# Patient Record
Sex: Female | Born: 1970 | Race: White | Hispanic: No
Health system: Southern US, Community
[De-identification: ages and names within clinical notes are randomized; demographics above are authoritative.]

---

## 2021-02-09 ENCOUNTER — Inpatient Hospital Stay (HOSPITAL_COMMUNITY): Payer: Medicaid - Out of State

## 2021-02-09 ENCOUNTER — Emergency Department (HOSPITAL_COMMUNITY): Payer: Medicaid - Out of State

## 2021-02-09 ENCOUNTER — Encounter (HOSPITAL_COMMUNITY): Payer: Self-pay

## 2021-02-09 ENCOUNTER — Inpatient Hospital Stay (HOSPITAL_COMMUNITY)
Admission: EM | Admit: 2021-02-09 | Discharge: 2021-03-07 | DRG: 917 | Disposition: A | Discharge status: Home / Self Care | Payer: Medicaid - Out of State | Attending: Internal Medicine | Admitting: Internal Medicine

## 2021-02-09 DIAGNOSIS — K56600 Partial intestinal obstruction, unspecified as to cause: Secondary | ICD-10-CM | POA: Diagnosis present

## 2021-02-09 DIAGNOSIS — F251 Schizoaffective disorder, depressive type: Secondary | ICD-10-CM | POA: Diagnosis not present

## 2021-02-09 DIAGNOSIS — Z4659 Encounter for fitting and adjustment of other gastrointestinal appliance and device: Secondary | ICD-10-CM

## 2021-02-09 DIAGNOSIS — N19 Unspecified kidney failure: Secondary | ICD-10-CM | POA: Diagnosis present

## 2021-02-09 DIAGNOSIS — M79605 Pain in left leg: Secondary | ICD-10-CM | POA: Diagnosis not present

## 2021-02-09 DIAGNOSIS — R1011 Right upper quadrant pain: Secondary | ICD-10-CM

## 2021-02-09 DIAGNOSIS — F1920 Other psychoactive substance dependence, uncomplicated: Secondary | ICD-10-CM | POA: Diagnosis present

## 2021-02-09 DIAGNOSIS — K429 Umbilical hernia without obstruction or gangrene: Secondary | ICD-10-CM | POA: Diagnosis present

## 2021-02-09 DIAGNOSIS — E871 Hypo-osmolality and hyponatremia: Secondary | ICD-10-CM | POA: Diagnosis not present

## 2021-02-09 DIAGNOSIS — R45851 Suicidal ideations: Secondary | ICD-10-CM | POA: Diagnosis present

## 2021-02-09 DIAGNOSIS — M609 Myositis, unspecified: Secondary | ICD-10-CM | POA: Diagnosis present

## 2021-02-09 DIAGNOSIS — N3289 Other specified disorders of bladder: Secondary | ICD-10-CM | POA: Diagnosis not present

## 2021-02-09 DIAGNOSIS — R509 Fever, unspecified: Secondary | ICD-10-CM | POA: Diagnosis not present

## 2021-02-09 DIAGNOSIS — J9601 Acute respiratory failure with hypoxia: Secondary | ICD-10-CM | POA: Diagnosis present

## 2021-02-09 DIAGNOSIS — R432 Parageusia: Secondary | ICD-10-CM | POA: Diagnosis not present

## 2021-02-09 DIAGNOSIS — K439 Ventral hernia without obstruction or gangrene: Secondary | ICD-10-CM | POA: Diagnosis present

## 2021-02-09 DIAGNOSIS — K828 Other specified diseases of gallbladder: Secondary | ICD-10-CM | POA: Diagnosis present

## 2021-02-09 DIAGNOSIS — G928 Other toxic encephalopathy: Secondary | ICD-10-CM | POA: Diagnosis present

## 2021-02-09 DIAGNOSIS — M7989 Other specified soft tissue disorders: Secondary | ICD-10-CM | POA: Diagnosis not present

## 2021-02-09 DIAGNOSIS — F259 Schizoaffective disorder, unspecified: Secondary | ICD-10-CM | POA: Diagnosis not present

## 2021-02-09 DIAGNOSIS — T508X5A Adverse effect of diagnostic agents, initial encounter: Secondary | ICD-10-CM | POA: Diagnosis not present

## 2021-02-09 DIAGNOSIS — I248 Other forms of acute ischemic heart disease: Secondary | ICD-10-CM | POA: Diagnosis present

## 2021-02-09 DIAGNOSIS — K8010 Calculus of gallbladder with chronic cholecystitis without obstruction: Secondary | ICD-10-CM | POA: Diagnosis present

## 2021-02-09 DIAGNOSIS — N179 Acute kidney failure, unspecified: Secondary | ICD-10-CM | POA: Diagnosis present

## 2021-02-09 DIAGNOSIS — R570 Cardiogenic shock: Secondary | ICD-10-CM | POA: Diagnosis not present

## 2021-02-09 DIAGNOSIS — E861 Hypovolemia: Secondary | ICD-10-CM | POA: Diagnosis present

## 2021-02-09 DIAGNOSIS — I249 Acute ischemic heart disease, unspecified: Secondary | ICD-10-CM | POA: Diagnosis not present

## 2021-02-09 DIAGNOSIS — D696 Thrombocytopenia, unspecified: Secondary | ICD-10-CM | POA: Diagnosis present

## 2021-02-09 DIAGNOSIS — D638 Anemia in other chronic diseases classified elsewhere: Secondary | ICD-10-CM | POA: Diagnosis present

## 2021-02-09 DIAGNOSIS — N280 Ischemia and infarction of kidney: Secondary | ICD-10-CM | POA: Diagnosis present

## 2021-02-09 DIAGNOSIS — Z95828 Presence of other vascular implants and grafts: Secondary | ICD-10-CM | POA: Diagnosis not present

## 2021-02-09 DIAGNOSIS — I10 Essential (primary) hypertension: Secondary | ICD-10-CM | POA: Diagnosis present

## 2021-02-09 DIAGNOSIS — Z20822 Contact with and (suspected) exposure to covid-19: Secondary | ICD-10-CM | POA: Diagnosis present

## 2021-02-09 DIAGNOSIS — R5381 Other malaise: Secondary | ICD-10-CM | POA: Diagnosis not present

## 2021-02-09 DIAGNOSIS — T829XXA Unspecified complication of cardiac and vascular prosthetic device, implant and graft, initial encounter: Secondary | ICD-10-CM

## 2021-02-09 DIAGNOSIS — K811 Chronic cholecystitis: Secondary | ICD-10-CM | POA: Diagnosis present

## 2021-02-09 DIAGNOSIS — K810 Acute cholecystitis: Secondary | ICD-10-CM

## 2021-02-09 DIAGNOSIS — G629 Polyneuropathy, unspecified: Secondary | ICD-10-CM | POA: Diagnosis present

## 2021-02-09 DIAGNOSIS — Z992 Dependence on renal dialysis: Secondary | ICD-10-CM | POA: Diagnosis not present

## 2021-02-09 DIAGNOSIS — E875 Hyperkalemia: Secondary | ICD-10-CM | POA: Diagnosis present

## 2021-02-09 DIAGNOSIS — K56609 Unspecified intestinal obstruction, unspecified as to partial versus complete obstruction: Secondary | ICD-10-CM | POA: Diagnosis not present

## 2021-02-09 DIAGNOSIS — Z452 Encounter for adjustment and management of vascular access device: Secondary | ICD-10-CM

## 2021-02-09 DIAGNOSIS — R29898 Other symptoms and signs involving the musculoskeletal system: Secondary | ICD-10-CM | POA: Diagnosis not present

## 2021-02-09 DIAGNOSIS — R579 Shock, unspecified: Secondary | ICD-10-CM | POA: Diagnosis present

## 2021-02-09 DIAGNOSIS — Z6841 Body Mass Index (BMI) 40.0 and over, adult: Secondary | ICD-10-CM | POA: Diagnosis not present

## 2021-02-09 DIAGNOSIS — F4320 Adjustment disorder, unspecified: Secondary | ICD-10-CM

## 2021-02-09 DIAGNOSIS — G9340 Encephalopathy, unspecified: Secondary | ICD-10-CM | POA: Diagnosis not present

## 2021-02-09 DIAGNOSIS — R600 Localized edema: Secondary | ICD-10-CM | POA: Diagnosis present

## 2021-02-09 DIAGNOSIS — T40604A Poisoning by unspecified narcotics, undetermined, initial encounter: Secondary | ICD-10-CM | POA: Diagnosis not present

## 2021-02-09 DIAGNOSIS — M6282 Rhabdomyolysis: Principal | ICD-10-CM

## 2021-02-09 DIAGNOSIS — M48061 Spinal stenosis, lumbar region without neurogenic claudication: Secondary | ICD-10-CM | POA: Diagnosis present

## 2021-02-09 DIAGNOSIS — E872 Acidosis: Secondary | ICD-10-CM | POA: Diagnosis present

## 2021-02-09 DIAGNOSIS — D649 Anemia, unspecified: Secondary | ICD-10-CM | POA: Diagnosis present

## 2021-02-09 DIAGNOSIS — N141 Nephropathy induced by other drugs, medicaments and biological substances: Secondary | ICD-10-CM | POA: Diagnosis not present

## 2021-02-09 DIAGNOSIS — T402X1A Poisoning by other opioids, accidental (unintentional), initial encounter: Secondary | ICD-10-CM | POA: Diagnosis present

## 2021-02-09 DIAGNOSIS — K8020 Calculus of gallbladder without cholecystitis without obstruction: Secondary | ICD-10-CM

## 2021-02-09 DIAGNOSIS — R52 Pain, unspecified: Secondary | ICD-10-CM

## 2021-02-09 DIAGNOSIS — K7200 Acute and subacute hepatic failure without coma: Secondary | ICD-10-CM | POA: Diagnosis present

## 2021-02-09 DIAGNOSIS — Z634 Disappearance and death of family member: Secondary | ICD-10-CM

## 2021-02-09 DIAGNOSIS — R443 Hallucinations, unspecified: Secondary | ICD-10-CM | POA: Diagnosis not present

## 2021-02-09 LAB — CBC WITH DIFFERENTIAL/PLATELET
Abs Immature Granulocytes: 0.82 10*3/uL — ABNORMAL HIGH (ref 0.00–0.07)
Basophils Absolute: 0.2 10*3/uL — ABNORMAL HIGH (ref 0.0–0.1)
Basophils Relative: 1 %
Eosinophils Absolute: 0 10*3/uL (ref 0.0–0.5)
Eosinophils Relative: 0 %
HCT: 49.5 % — ABNORMAL HIGH (ref 36.0–46.0)
Hemoglobin: 15.9 g/dL — ABNORMAL HIGH (ref 12.0–15.0)
Immature Granulocytes: 3 %
Lymphocytes Relative: 20 %
Lymphs Abs: 6 10*3/uL — ABNORMAL HIGH (ref 0.7–4.0)
MCH: 29.3 pg (ref 26.0–34.0)
MCHC: 32.1 g/dL (ref 30.0–36.0)
MCV: 91.2 fL (ref 80.0–100.0)
Monocytes Absolute: 2.5 10*3/uL — ABNORMAL HIGH (ref 0.1–1.0)
Monocytes Relative: 8 %
Neutro Abs: 21 10*3/uL — ABNORMAL HIGH (ref 1.7–7.7)
Neutrophils Relative %: 68 %
Platelets: 370 10*3/uL (ref 150–400)
RBC: 5.43 MIL/uL — ABNORMAL HIGH (ref 3.87–5.11)
RDW: 14 % (ref 11.5–15.5)
WBC: 30.6 10*3/uL — ABNORMAL HIGH (ref 4.0–10.5)
nRBC: 0.2 % (ref 0.0–0.2)

## 2021-02-09 LAB — BASIC METABOLIC PANEL
Anion gap: 17 — ABNORMAL HIGH (ref 5–15)
Anion gap: 17 — ABNORMAL HIGH (ref 5–15)
BUN: 42 mg/dL — ABNORMAL HIGH (ref 6–20)
BUN: 37 mg/dL — ABNORMAL HIGH (ref 6–20)
CO2: 14 mmol/L — ABNORMAL LOW (ref 22–32)
CO2: 19 mmol/L — ABNORMAL LOW (ref 22–32)
Calcium: 6.9 mg/dL — ABNORMAL LOW (ref 8.9–10.3)
Calcium: 6.8 mg/dL — ABNORMAL LOW (ref 8.9–10.3)
Chloride: 100 mmol/L (ref 98–111)
Chloride: 94 mmol/L — ABNORMAL LOW (ref 98–111)
Creatinine, Ser: 3.38 mg/dL — ABNORMAL HIGH (ref 0.44–1.00)
Creatinine, Ser: 2.68 mg/dL — ABNORMAL HIGH (ref 0.44–1.00)
GFR, Estimated: 16 mL/min — ABNORMAL LOW (ref >60)
GFR, Estimated: 21 mL/min — ABNORMAL LOW (ref >60)
Glucose, Bld: 206 mg/dL — ABNORMAL HIGH (ref 70–99)
Glucose, Bld: 149 mg/dL — ABNORMAL HIGH (ref 70–99)
Potassium: >7.5 mmol/L (ref 3.5–5.1)
Potassium: 5.1 mmol/L (ref 3.5–5.1)
Sodium: 131 mmol/L — ABNORMAL LOW (ref 135–145)
Sodium: 130 mmol/L — ABNORMAL LOW (ref 135–145)

## 2021-02-09 LAB — URINALYSIS, ROUTINE W REFLEX MICROSCOPIC
Bilirubin Urine: NEGATIVE
Glucose, UA: 150 mg/dL — AB
Ketones, ur: 5 mg/dL — AB
Leukocytes,Ua: NEGATIVE
Nitrite: NEGATIVE
Protein, ur: 100 mg/dL — AB
RBC / HPF: >50 RBC/hpf — ABNORMAL HIGH (ref 0–5)
Specific Gravity, Urine: 1.029 (ref 1.005–1.030)
pH: 6 (ref 5.0–8.0)

## 2021-02-09 LAB — MAGNESIUM: Magnesium: 3.1 mg/dL — ABNORMAL HIGH (ref 1.7–2.4)

## 2021-02-09 LAB — RESP PANEL BY RT-PCR (FLU A&B, COVID) ARPGX2
Influenza A by PCR: NEGATIVE
Influenza B by PCR: NEGATIVE
SARS Coronavirus 2 by RT PCR: NEGATIVE

## 2021-02-09 LAB — COMPREHENSIVE METABOLIC PANEL
ALT: 467 U/L — ABNORMAL HIGH (ref 0–44)
AST: 1411 U/L — ABNORMAL HIGH (ref 15–41)
Albumin: 4 g/dL (ref 3.5–5.0)
Alkaline Phosphatase: 199 U/L — ABNORMAL HIGH (ref 38–126)
Anion gap: 19 — ABNORMAL HIGH (ref 5–15)
BUN: 38 mg/dL — ABNORMAL HIGH (ref 6–20)
CO2: 16 mmol/L — ABNORMAL LOW (ref 22–32)
Calcium: 7.5 mg/dL — ABNORMAL LOW (ref 8.9–10.3)
Chloride: 101 mmol/L (ref 98–111)
Creatinine, Ser: 3.39 mg/dL — ABNORMAL HIGH (ref 0.44–1.00)
GFR, Estimated: 16 mL/min — ABNORMAL LOW (ref >60)
Glucose, Bld: 164 mg/dL — ABNORMAL HIGH (ref 70–99)
Potassium: >7.5 mmol/L (ref 3.5–5.1)
Sodium: 136 mmol/L (ref 135–145)
Total Bilirubin: 0.7 mg/dL (ref 0.3–1.2)
Total Protein: 9.6 g/dL — ABNORMAL HIGH (ref 6.5–8.1)

## 2021-02-09 LAB — BLOOD GAS, VENOUS
Acid-base deficit: 12.7 mmol/L — ABNORMAL HIGH (ref 0.0–2.0)
Bicarbonate: 14.7 mmol/L — ABNORMAL LOW (ref 20.0–28.0)
O2 Saturation: 68.3 %
Patient temperature: 98.6
pCO2, Ven: 38.8 mmHg — ABNORMAL LOW (ref 44.0–60.0)
pH, Ven: 7.202 — ABNORMAL LOW (ref 7.250–7.430)
pO2, Ven: 44.2 mmHg (ref 32.0–45.0)

## 2021-02-09 LAB — GLUCOSE, CAPILLARY: Glucose-Capillary: 158 mg/dL — ABNORMAL HIGH (ref 70–99)

## 2021-02-09 LAB — RENAL FUNCTION PANEL
Albumin: 3 g/dL — ABNORMAL LOW (ref 3.5–5.0)
Anion gap: 13 (ref 5–15)
BUN: 40 mg/dL — ABNORMAL HIGH (ref 6–20)
CO2: 17 mmol/L — ABNORMAL LOW (ref 22–32)
Calcium: 6.4 mg/dL — CL (ref 8.9–10.3)
Chloride: 101 mmol/L (ref 98–111)
Creatinine, Ser: 2.67 mg/dL — ABNORMAL HIGH (ref 0.44–1.00)
GFR, Estimated: 21 mL/min — ABNORMAL LOW (ref >60)
Glucose, Bld: 168 mg/dL — ABNORMAL HIGH (ref 70–99)
Phosphorus: 7.2 mg/dL — ABNORMAL HIGH (ref 2.5–4.6)
Potassium: 5.4 mmol/L — ABNORMAL HIGH (ref 3.5–5.1)
Sodium: 131 mmol/L — ABNORMAL LOW (ref 135–145)

## 2021-02-09 LAB — TROPONIN I (HIGH SENSITIVITY)
Troponin I (High Sensitivity): 3709 ng/L (ref <18)
Troponin I (High Sensitivity): 4449 ng/L (ref <18)
Troponin I (High Sensitivity): 2787 ng/L (ref <18)

## 2021-02-09 LAB — RAPID URINE DRUG SCREEN, HOSP PERFORMED
Amphetamines: NOT DETECTED
Barbiturates: NOT DETECTED
Benzodiazepines: NOT DETECTED
Cocaine: NOT DETECTED
Opiates: NOT DETECTED
Tetrahydrocannabinol: NOT DETECTED

## 2021-02-09 LAB — ETHANOL: Alcohol, Ethyl (B): <10 mg/dL (ref <10)

## 2021-02-09 LAB — HCG, SERUM, QUALITATIVE: Preg, Serum: NEGATIVE

## 2021-02-09 LAB — CK: Total CK: >50000 U/L — ABNORMAL HIGH (ref 38–234)

## 2021-02-09 LAB — LACTIC ACID, PLASMA
Lactic Acid, Venous: 4.4 mmol/L (ref 0.5–1.9)
Lactic Acid, Venous: 4 mmol/L (ref 0.5–1.9)

## 2021-02-09 LAB — HIV ANTIBODY (ROUTINE TESTING W REFLEX): HIV Screen 4th Generation wRfx: NONREACTIVE

## 2021-02-09 LAB — PREGNANCY, URINE: Preg Test, Ur: NEGATIVE

## 2021-02-09 LAB — PHOSPHORUS: Phosphorus: 10.7 mg/dL — ABNORMAL HIGH (ref 2.5–4.6)

## 2021-02-09 LAB — MRSA PCR SCREENING: MRSA by PCR: POSITIVE — AB

## 2021-02-09 LAB — SALICYLATE LEVEL: Salicylate Lvl: <7 mg/dL — ABNORMAL LOW (ref 7.0–30.0)

## 2021-02-09 IMAGING — CT CT HEAD W/O CM
3 of 5 series · 14 of 47 positions shown, 16 images · non-contrast
Comparison: None.

CLINICAL DATA: Lower extremity numbness.

EXAM:
CT HEAD WITHOUT CONTRAST
TECHNIQUE: Contiguous axial images were obtained from the base of the skull
through the vertex without intravenous contrast.

[Series 5: sagittal soft tissue · sagittal · 0.37mm/px · 3 of 56 slices shown]
[im 19/56  brain]
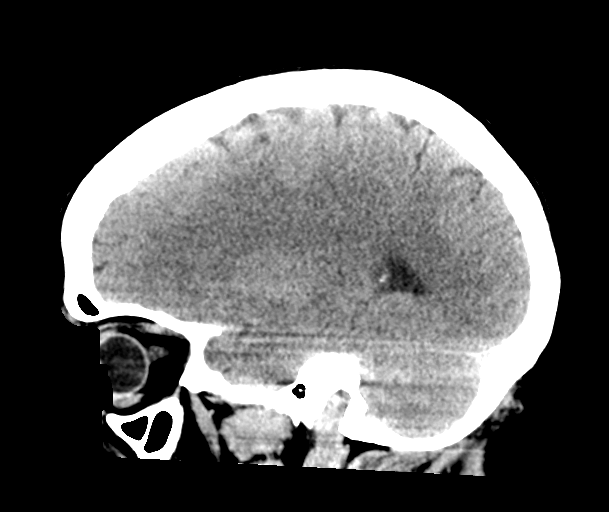
[im 28/56  brain]
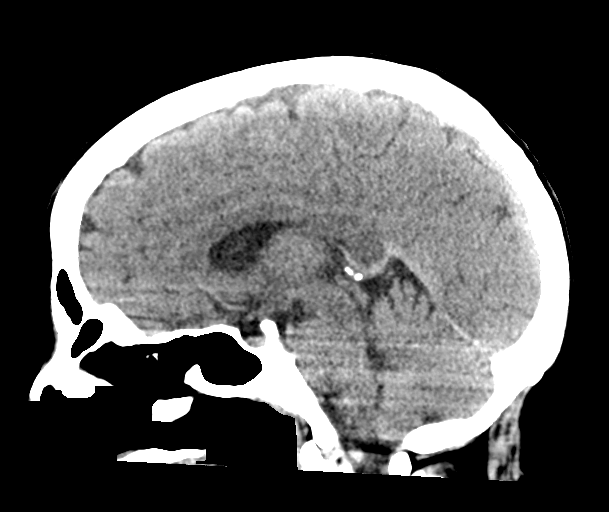
[im 37/56  brain]
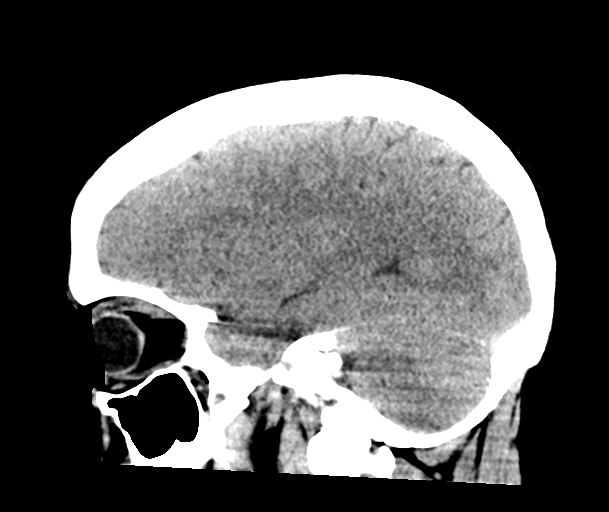

[Series 6: coronal soft tissue · coronal · 0.33mm/px · 3 of 75 slices shown]
[im 25/75  brain]
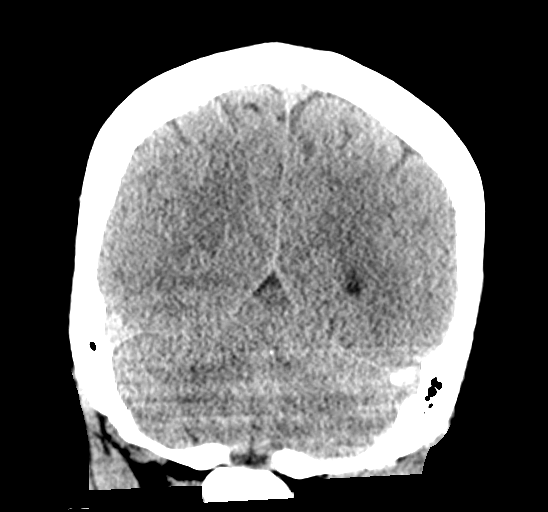
[im 33/75  brain]
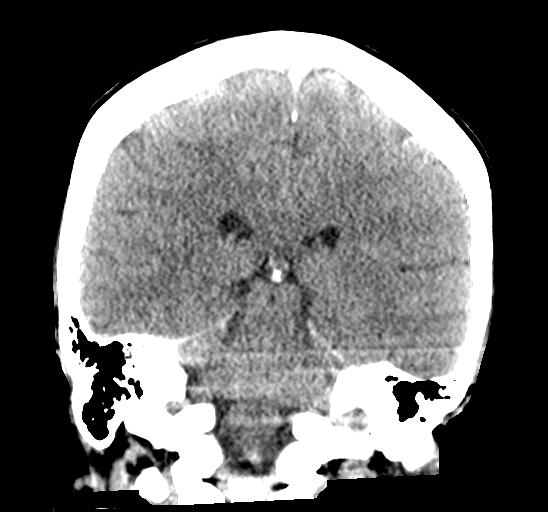
[im 42/75  brain]
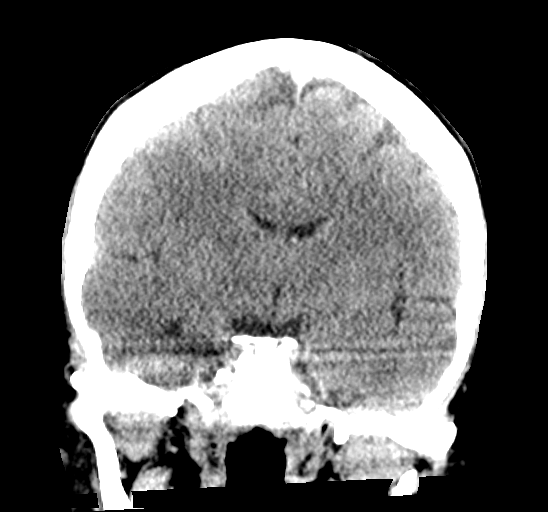

[Series 7: true axial · axial · 0.32mm/px · z∈[+1501,+1650]mm · 8 of 62 slices shown, 10 images]
[im 6/62  brain]
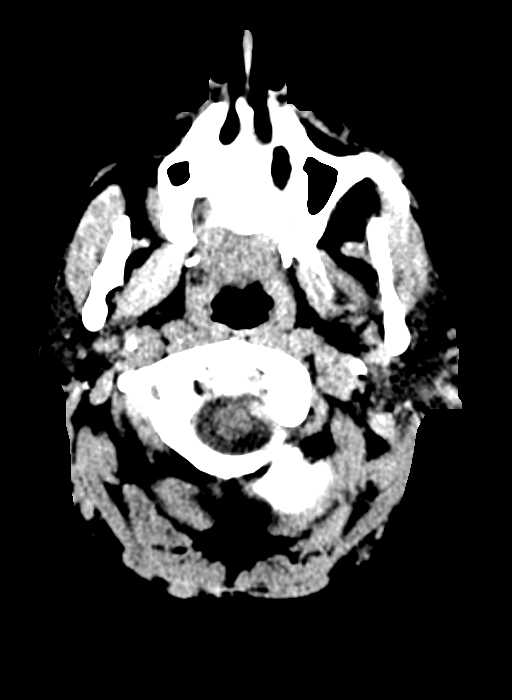
[im 6/62  bone]
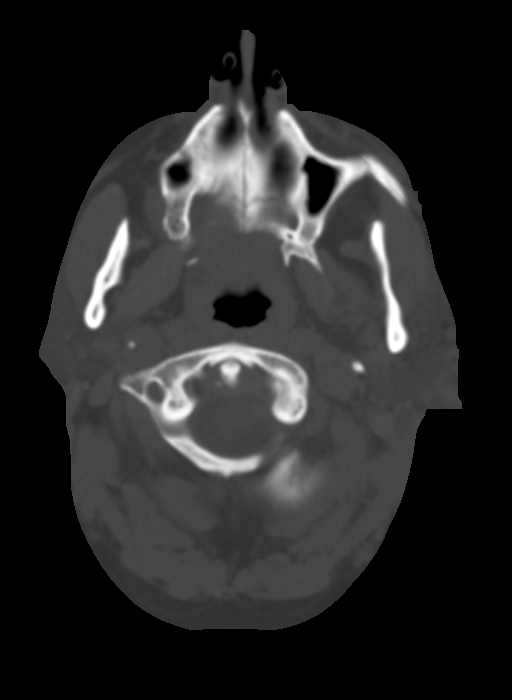
[im 12/62  brain]
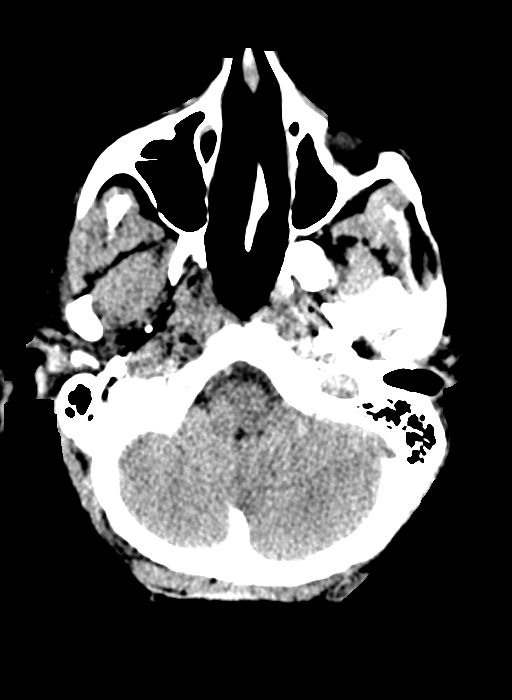
[im 23/62  brain]
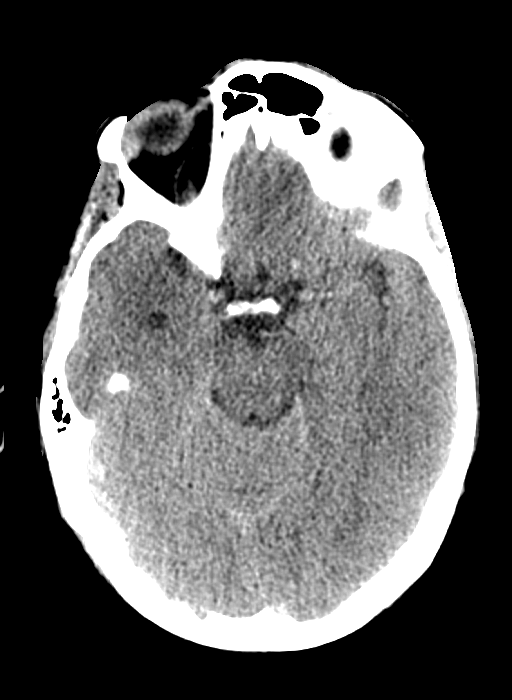
[im 28/62  brain]
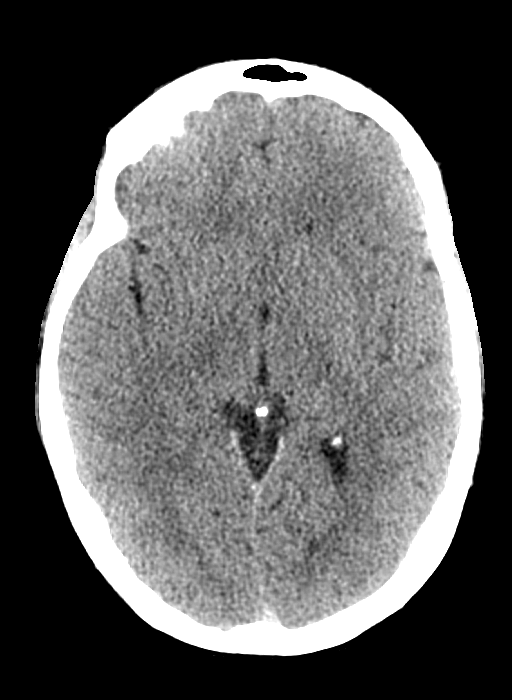
[im 34/62  brain]
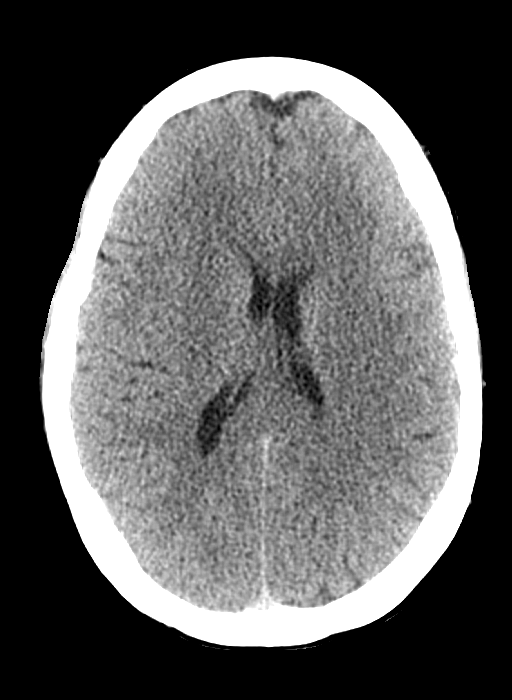
[im 34/62  bone]
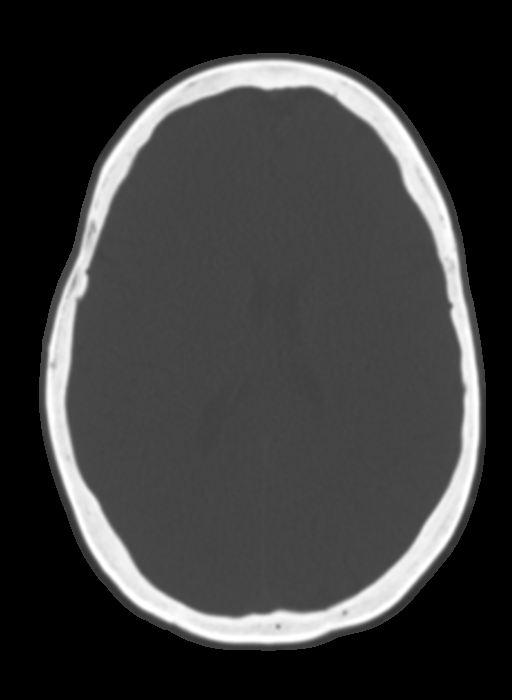
[im 39/62  brain]
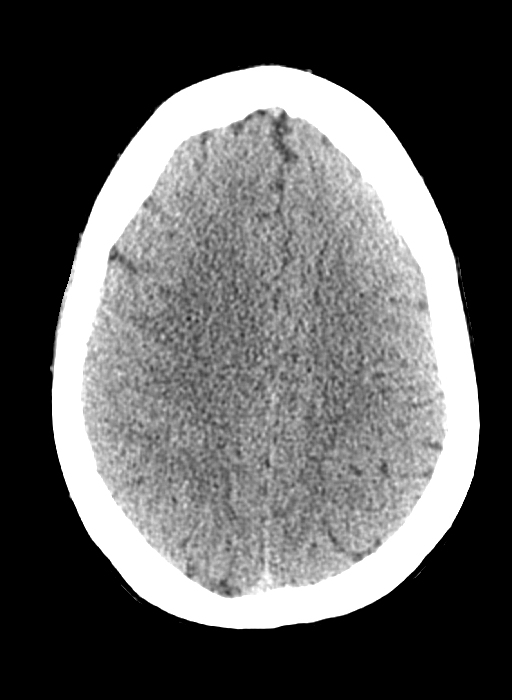
[im 50/62  brain]
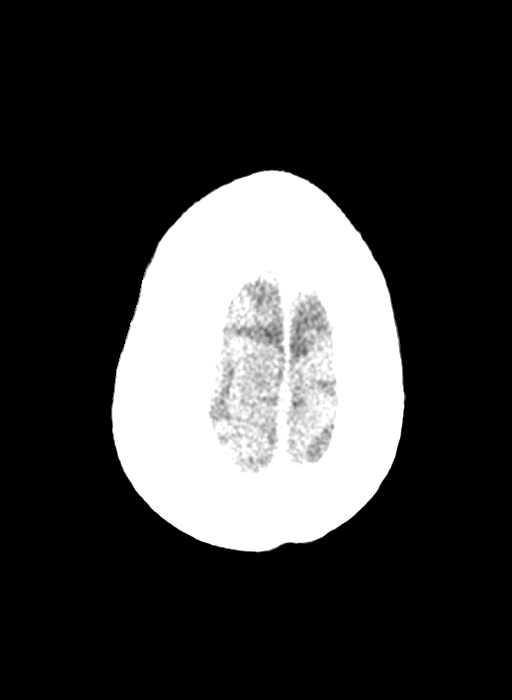
[im 56/62  brain]
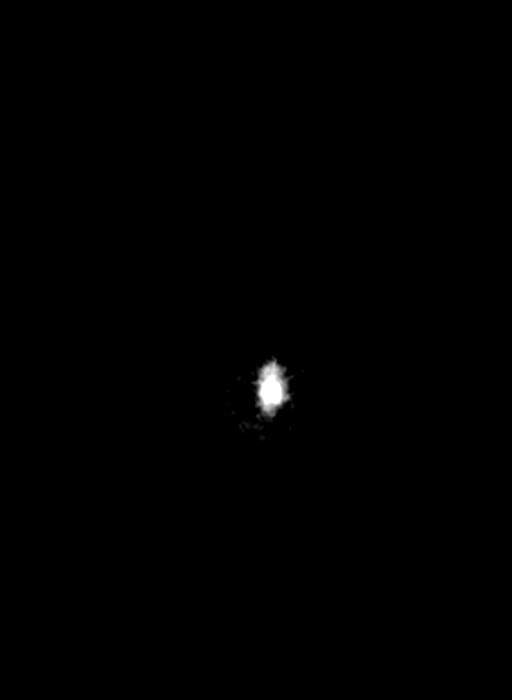

[14 of 47 positions shown; findings below may reference images not displayed]

FINDINGS: Brain: Ventricles and sulci are normal in size and configuration.
There is no intracranial mass, hemorrhage, extra-axial fluid
collection, or midline shift. The brain parenchyma appears
unremarkable. No appreciable acute infarct.

Vascular: No hyperdense vessel. There is calcification in each
carotid siphon region.

Skull: Bony calvarium a appears intact.

Sinuses/Orbits: Opacification noted in a posterior ethmoid air cell
on the right. Orbits appear symmetric bilaterally.

Other: Mastoid air cells are clear.
IMPRESSION: Normal appearing brain parenchyma.  No mass or hemorrhage.

Foci of arterial vascular calcification noted. Opacification noted
in a posterior right-sided ethmoid air cell.

## 2021-02-09 IMAGING — CT CT ANGIO CHEST-ABD-PELV FOR DISSECTION W/ AND WO/W CM
2 of 14 series · 10 of 46 positions shown, 17 images · non-contrast
Comparison: None.

CLINICAL DATA: Lower abdominal pain, left leg numbness.

EXAM:
CT ANGIOGRAPHY CHEST, ABDOMEN AND PELVIS
TECHNIQUE: Non-contrast CT of the chest was initially obtained.

[Series 14: arterial thins · axial · arterial · 0.87mm/px · z∈[-632,-347]mm · 9 of 1163 slices shown, 15 images]
[im 106/1163  soft-tissue]
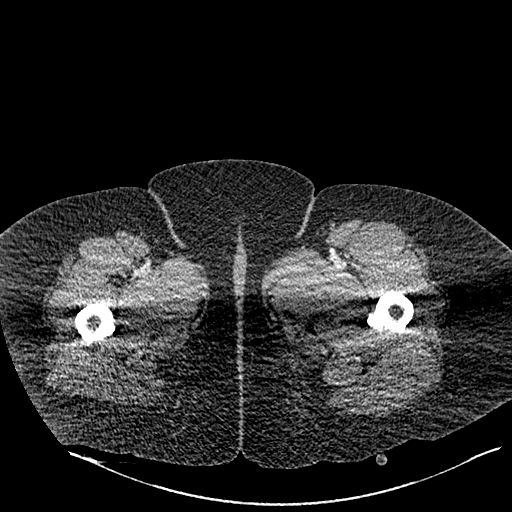
[im 106/1163  bone]
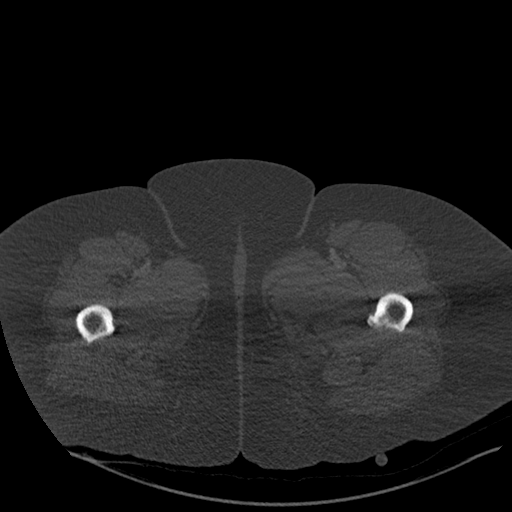
[im 212/1163  soft-tissue]
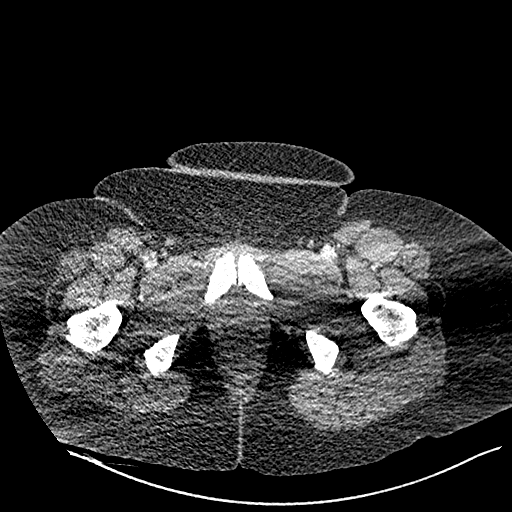
[im 317/1163  soft-tissue]
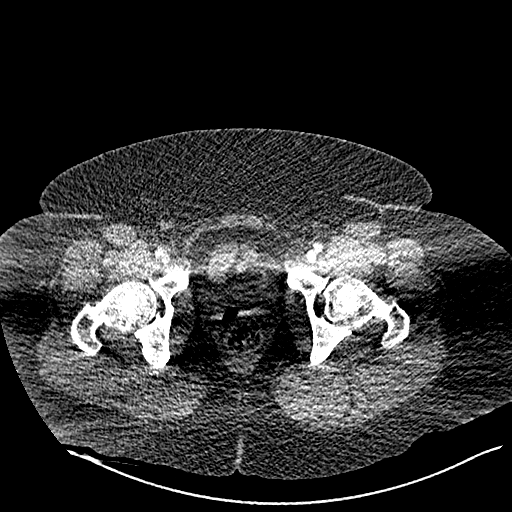
[im 423/1163  soft-tissue]
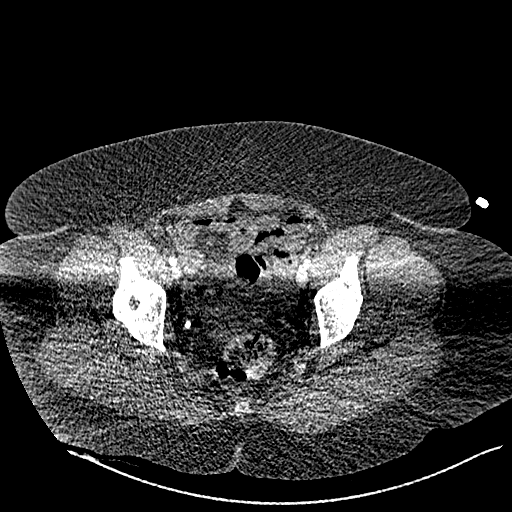
[im 634/1163  soft-tissue]
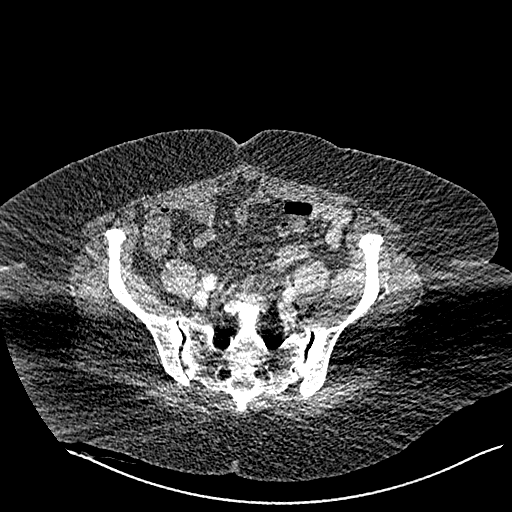
[im 740/1163  soft-tissue]
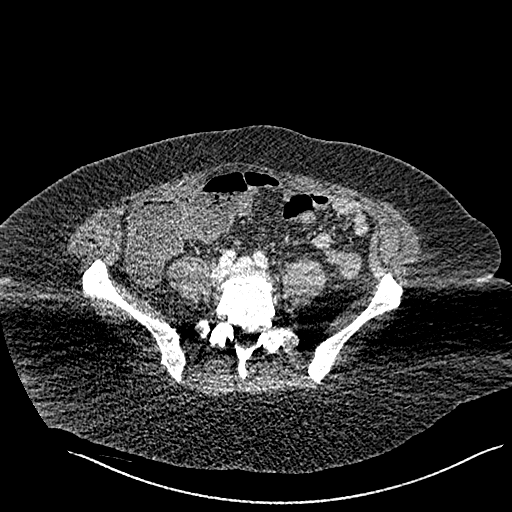
[im 740/1163  lung]
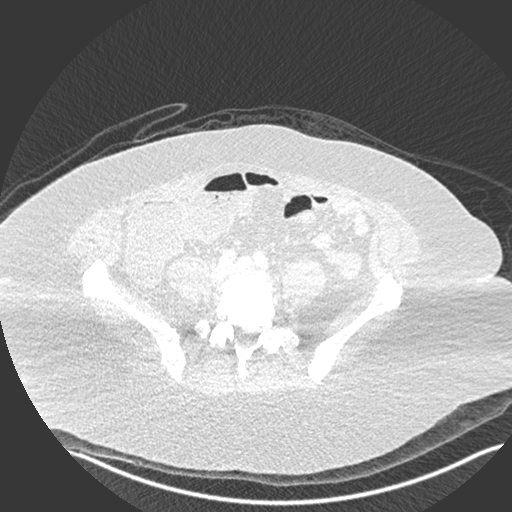
[im 846/1163  soft-tissue]
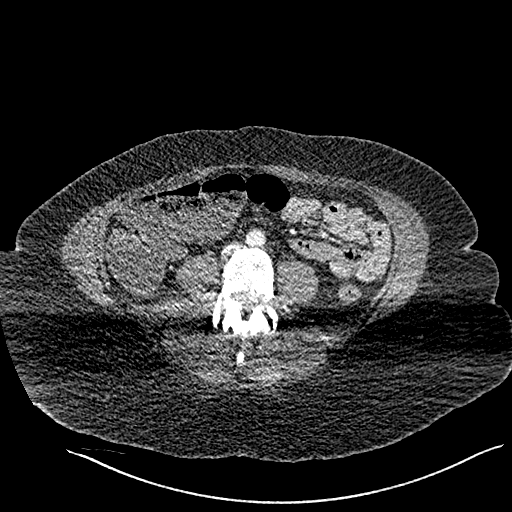
[im 846/1163  lung]
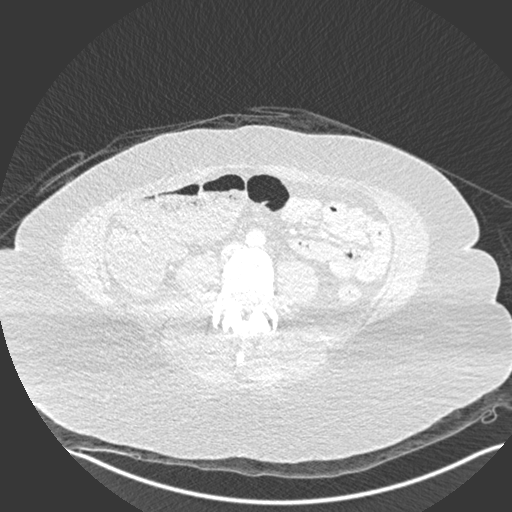
[im 951/1163  soft-tissue]
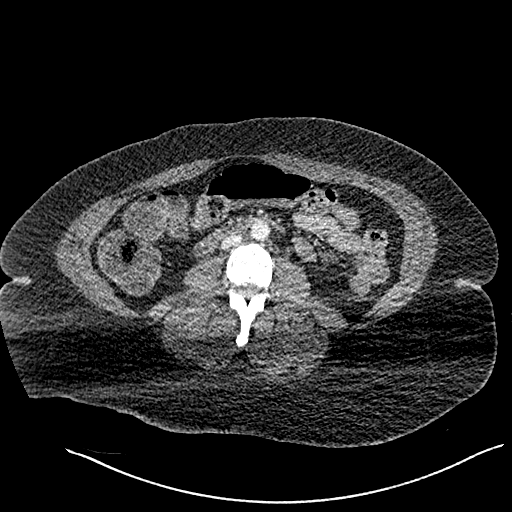
[im 951/1163  lung]
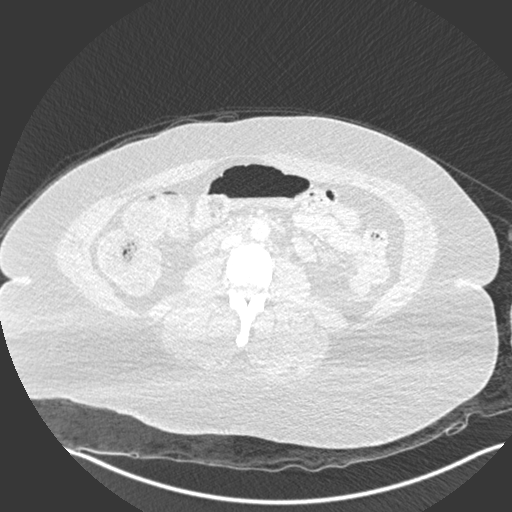
[im 1057/1163  soft-tissue]
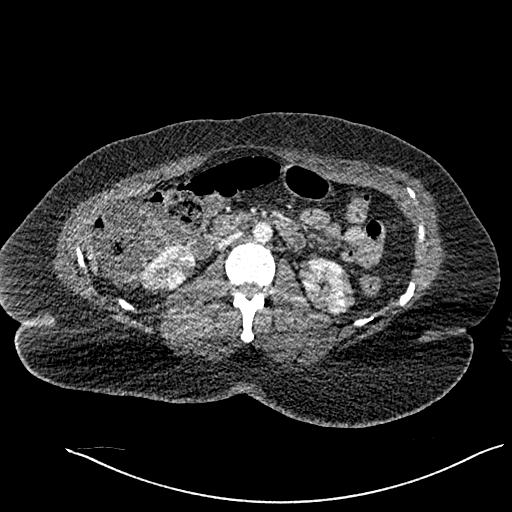
[im 1057/1163  lung]
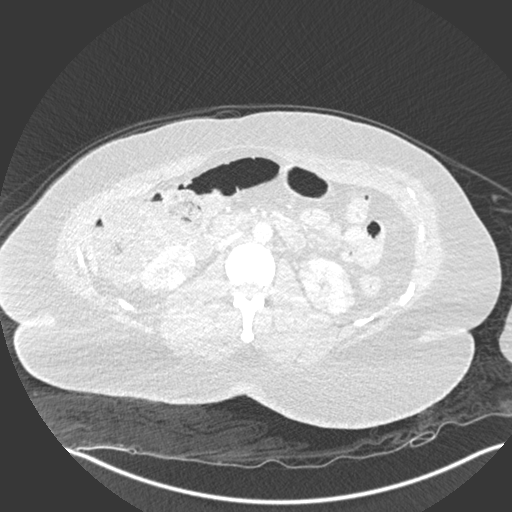
[im 1057/1163  bone]
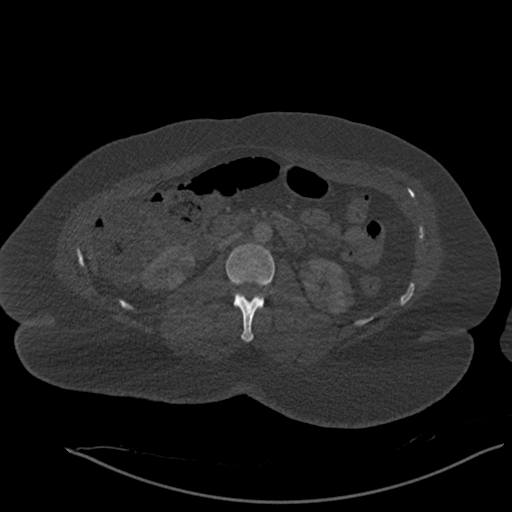

[Series 15: coronals · coronal · 0.78mm/px · 1 of 216 slices shown, 2 images]
[im 108/216  soft-tissue]
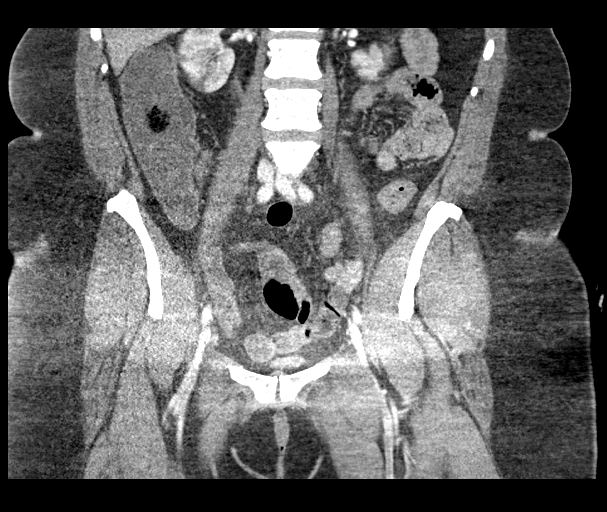
[im 108/216  bone]
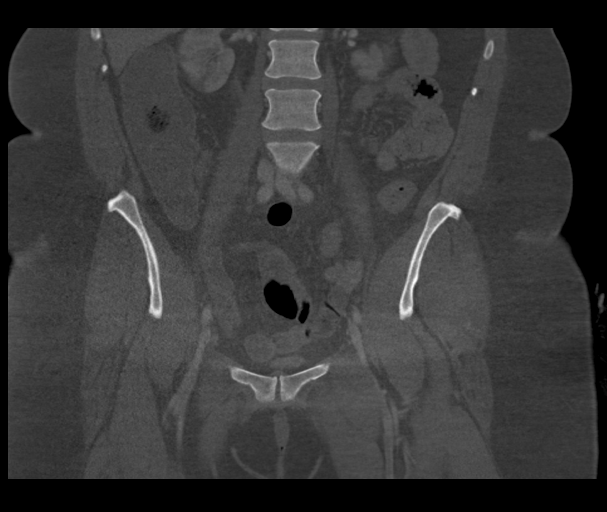

[10 of 46 positions shown; findings below may reference images not displayed]

Multidetector CT imaging through the chest, abdomen and pelvis was
performed using the standard protocol during bolus administration of
intravenous contrast. Multiplanar reconstructed images and MIPs were
obtained and reviewed to evaluate the vascular anatomy.

CONTRAST:  100mL OMNIPAQUE IOHEXOL 350 MG/ML SOLN
FINDINGS: CTA CHEST FINDINGS

Cardiovascular: Incidentally noted is a retro esophageal right
subclavian artery. Heart is normal size. Aorta is normal caliber. No
dissection no filling defects in the central pulmonary arteries to
suggest pulmonary emboli.

Mediastinum/Nodes: No mediastinal, hilar, or axillary adenopathy.
Trachea and esophagus are unremarkable. Thyroid unremarkable.

Lungs/Pleura: Linear densities at the left base in the lingula,
likely scarring. No confluent opacities or effusions.

Musculoskeletal: Chest wall soft tissues are unremarkable. No acute
bony abnormality.

Review of the MIP images confirms the above findings.

CTA ABDOMEN AND PELVIS FINDINGS

VASCULAR

Aorta: Normal caliber aorta without aneurysm, dissection, vasculitis
or significant stenosis.

Celiac: Patent without evidence of aneurysm, dissection, vasculitis
or significant stenosis.

SMA: Patent without evidence of aneurysm, dissection, vasculitis or
significant stenosis.

Renals: Both renal arteries are patent. No proximal stenosis. Both
renal arteries appear small caliber in the renal hila.

IMA: Very small, difficult to visualize.  Appears patent.

Inflow: Patent without evidence of aneurysm, dissection, vasculitis
or significant stenosis.

Veins: No obvious venous abnormality within the limitations of this
arterial phase study.

Review of the MIP images confirms the above findings.

NON-VASCULAR

Hepatobiliary: No focal hepatic abnormality. Gallbladder
unremarkable.

Pancreas: No focal abnormality or ductal dilatation.

Spleen: No focal abnormality.  Normal size.

Adrenals/Urinary Tract: There are areas of wedge-shaped decreased
perfusion noted throughout both kidneys. There is no surrounding
perinephric stranding. No hydronephrosis. Adrenal glands and urinary
bladder unremarkable.

Stomach/Bowel: Stomach, large and small bowel grossly unremarkable.
Small infraumbilical ventral hernia containing a small bowel loop.
No evidence of bowel obstruction.

Lymphatic: Aortic atherosclerosis. No evidence of aneurysm or
adenopathy.

Reproductive: Uterus and adnexa unremarkable.  No mass.

Other: No free fluid or free air.

Musculoskeletal: No acute bony abnormality. Degenerative changes in
the lower lumbar spine.

Review of the MIP images confirms the above findings.
IMPRESSION: No evidence of aortic aneurysm or dissection.

Proximal renal arteries are patent and free of disease. However, the
vessels appear small caliber bilaterally within the renal hila. This
is associated with extensive wedge-shaped areas of hypoperfusion
throughout both kidneys. This is felt to most likely reflect some
form of microvascular disease or vasoconstriction. Appearance
concerning for extensive early bilateral renal infarcts. This is
less likely pyelonephritis given the extensive nature of the finding
and lack of perinephric stranding.

Small infraumbilical ventral hernia containing small bowel loop. No
bowel obstruction.

These results were called by telephone at the time of interpretation
on [DATE] at [DATE] to provider TASSELL , who
verbally acknowledged these results.

## 2021-02-09 IMAGING — DX DG CHEST 1V PORT
1 series · 1 of 1 positions shown · non-contrast
Comparison: Portable exam [TR] hours compared to [TR] hours

CLINICAL DATA: Central line placement

EXAM:
PORTABLE CHEST 1 VIEW

[chest ap]
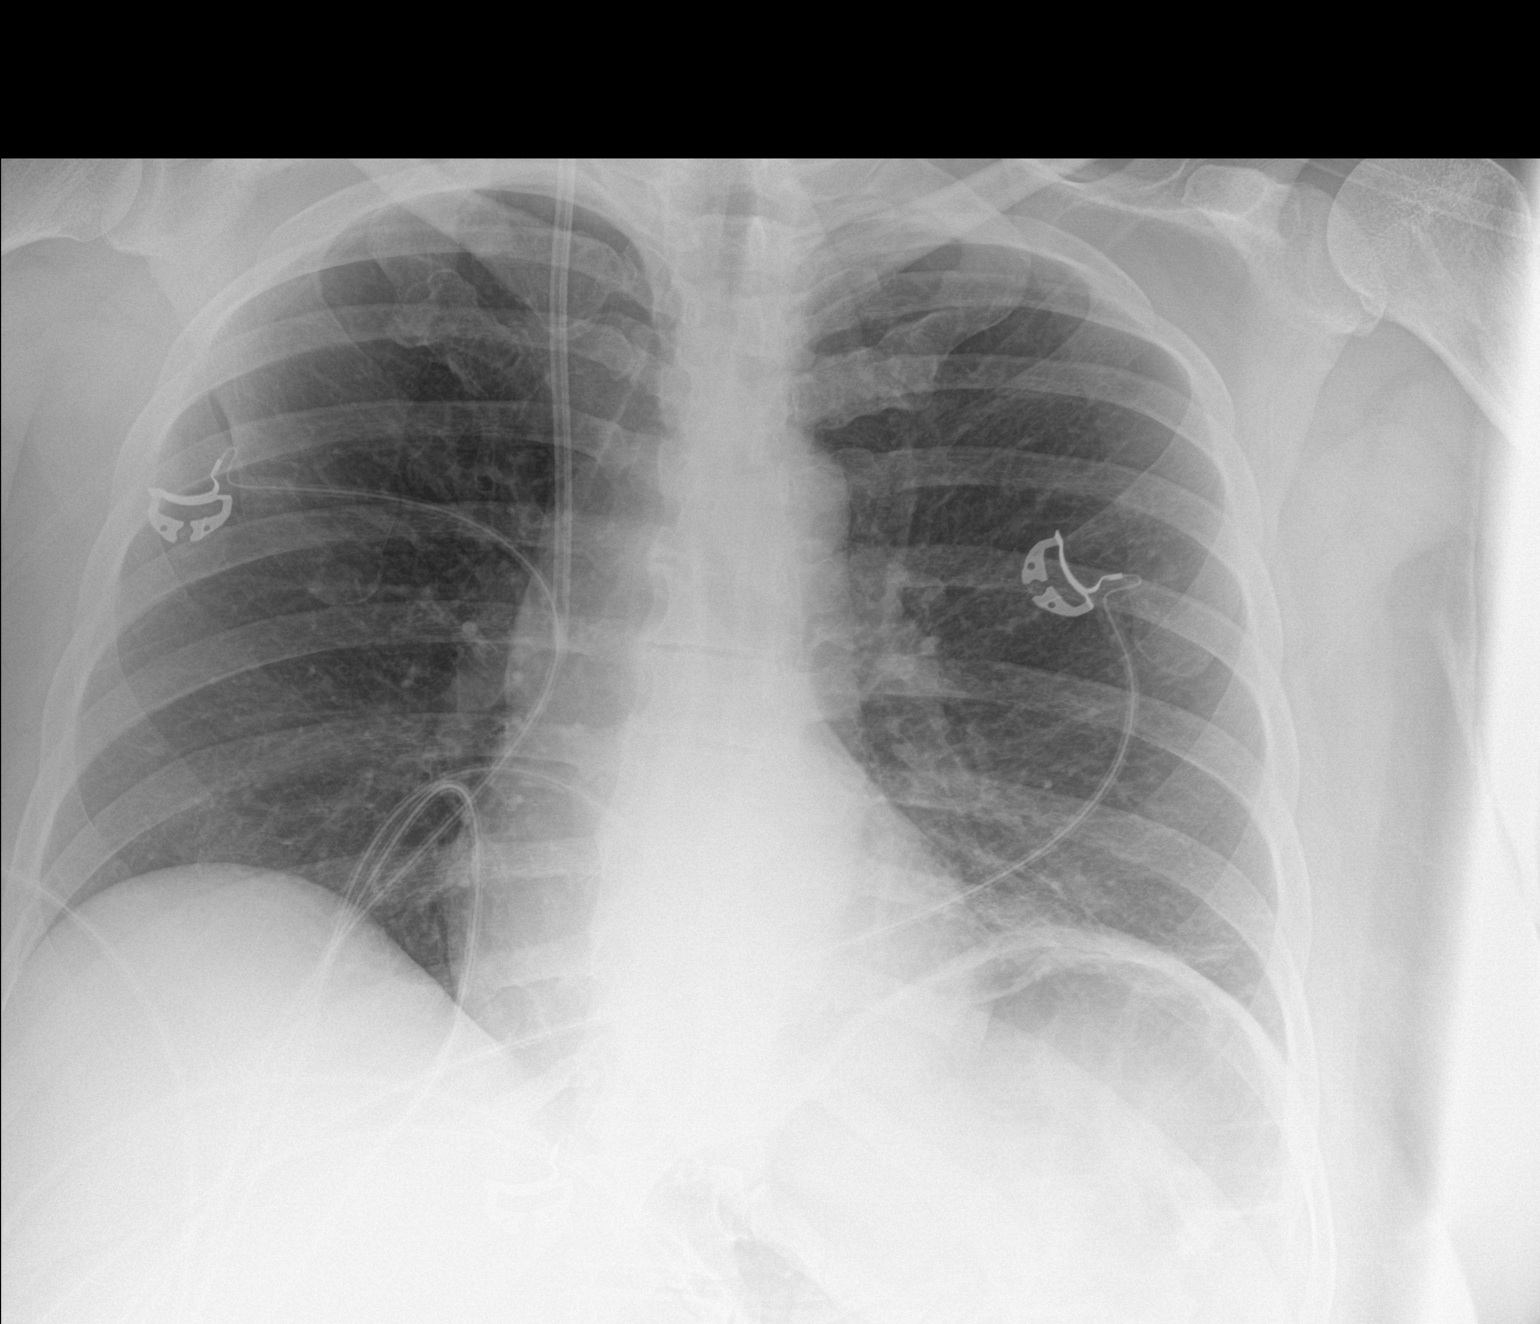

[1 of 1 positions shown; findings below may reference images not displayed]

FINDINGS: RIGHT jugular central venous catheter with tip projecting over SVC.

Normal heart size, mediastinal contours, and pulmonary vascularity.

LEFT basilar atelectasis.

Remaining lungs clear.

No pulmonary infiltrate, pleural effusion, or pneumothorax.

Osseous structures unremarkable.
IMPRESSION: No pneumothorax following RIGHT jugular line placement.

Subsegmental atelectasis LEFT base.

## 2021-02-09 IMAGING — DX DG CHEST 1V PORT
1 series · 1 of 1 positions shown · non-contrast
Comparison: Field

CLINICAL DATA: Shortness of breath

EXAM:
PORTABLE CHEST 1 VIEW

[chest ap]
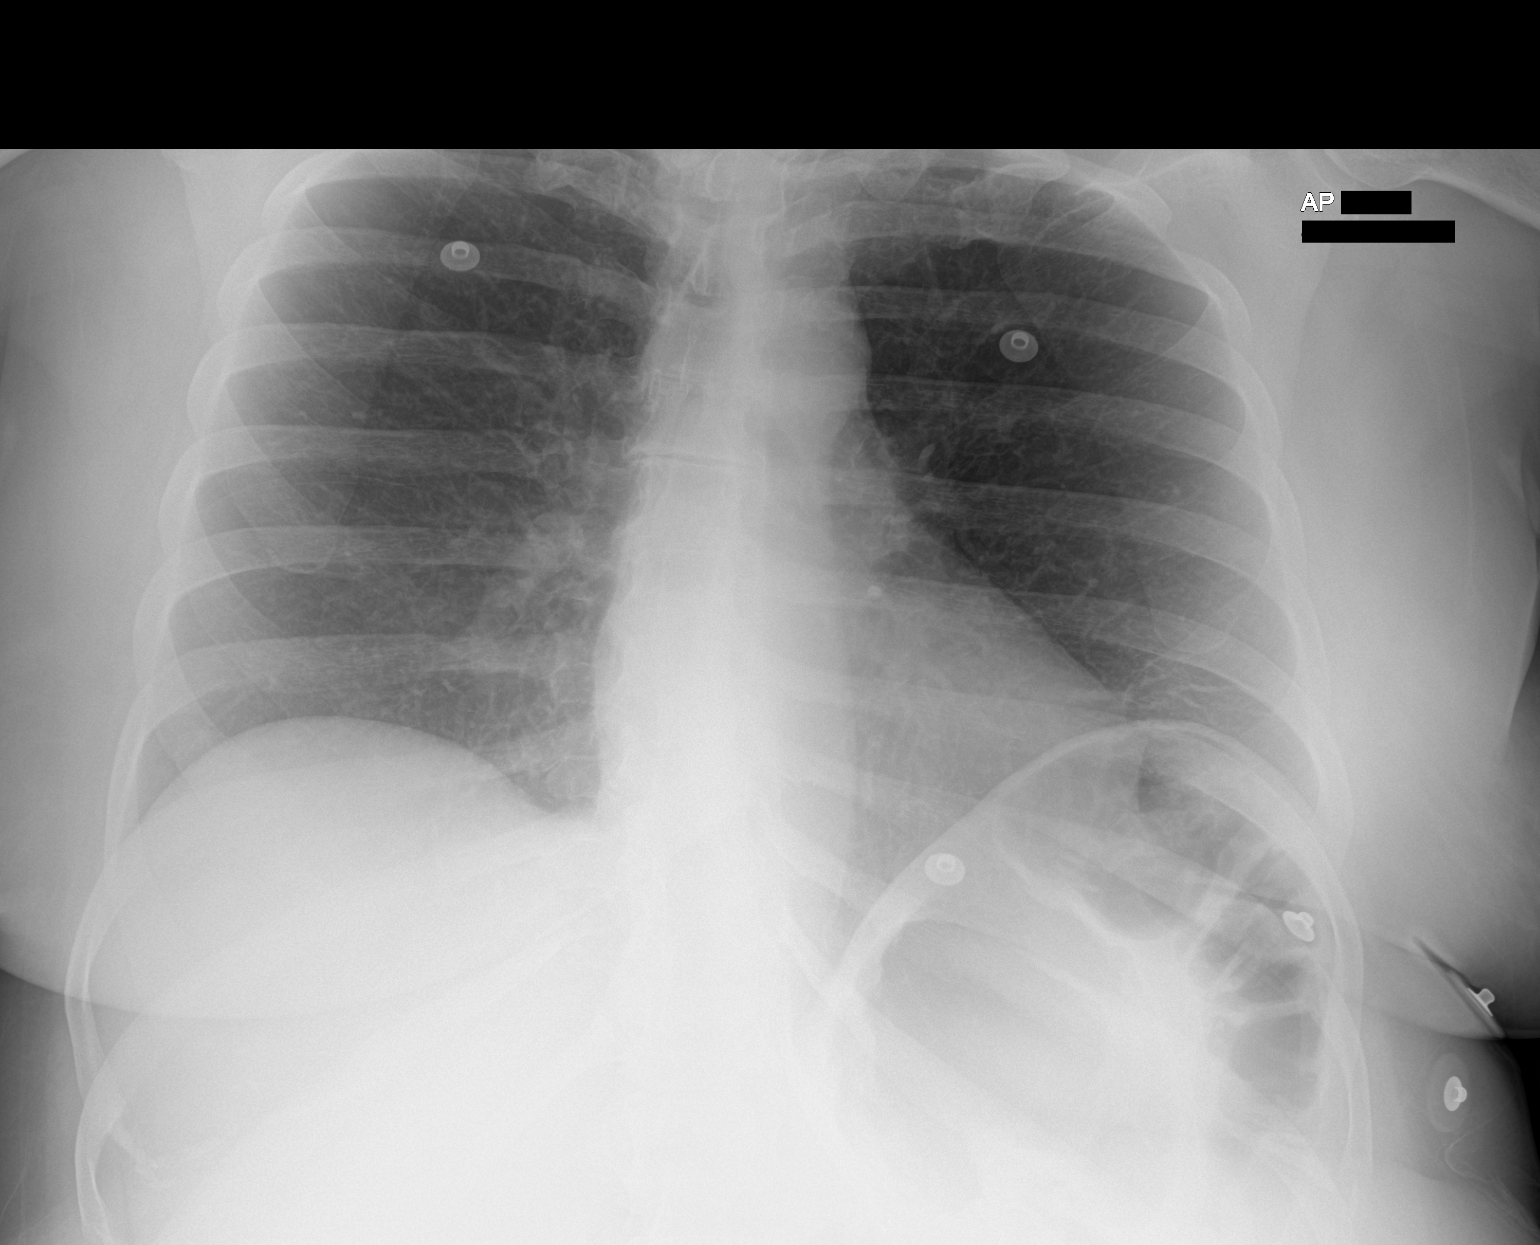

[1 of 1 positions shown; findings below may reference images not displayed]

FINDINGS: There is slight atelectasis left base. Lungs otherwise are clear.
Heart size and pulmonary vascularity are normal. No adenopathy. No
bone lesions.
IMPRESSION: Mild left base atelectasis. Lungs elsewhere clear. Heart size
normal.

## 2021-02-09 MED ORDER — LACTATED RINGERS IV BOLUS (SEPSIS)
800.0000 mL | Freq: Once | INTRAVENOUS | Status: AC
  Administered 2021-02-09: 800 mL via INTRAVENOUS

## 2021-02-09 MED ORDER — HEPARIN SODIUM (PORCINE) 1000 UNIT/ML DIALYSIS
1000.0000 [IU] | INTRAMUSCULAR | Status: DC | PRN
Start: 2021-02-09 — End: 2021-02-15
  Administered 2021-02-12 (×2): 1200 [IU] via INTRAVENOUS_CENTRAL
  Administered 2021-02-14: 2800 [IU] via INTRAVENOUS_CENTRAL
  Filled 2021-02-09: qty 6
  Filled 2021-02-09: qty 4
  Filled 2021-02-09 (×3): qty 6

## 2021-02-09 MED ORDER — ONDANSETRON HCL 4 MG/2ML IJ SOLN
4.0000 mg | Freq: Four times a day (QID) | INTRAMUSCULAR | Status: DC | PRN
  Administered 2021-02-09 – 2021-02-12 (×3): 4 mg via INTRAVENOUS
  Filled 2021-02-09 (×3): qty 2

## 2021-02-09 MED ORDER — DEXTROSE 50 % IV SOLN
1.0000 | Freq: Once | INTRAVENOUS | Status: AC
  Administered 2021-02-09: 50 mL via INTRAVENOUS
  Filled 2021-02-09: qty 50

## 2021-02-09 MED ORDER — DOCUSATE SODIUM 100 MG PO CAPS: 100.0000 mg | ORAL_CAPSULE | Freq: Two times a day (BID) | ORAL | Status: DC | PRN

## 2021-02-09 MED ORDER — FENTANYL CITRATE (PF) 100 MCG/2ML IJ SOLN
50.0000 ug | INTRAMUSCULAR | Status: DC | PRN
  Administered 2021-02-09 (×2): 50 ug via INTRAVENOUS
  Filled 2021-02-09 (×2): qty 2

## 2021-02-09 MED ORDER — CHLORHEXIDINE GLUCONATE CLOTH 2 % EX PADS
6.0000 | MEDICATED_PAD | Freq: Every day | CUTANEOUS | Status: DC
  Administered 2021-02-09: 6 via TOPICAL

## 2021-02-09 MED ORDER — IOHEXOL 350 MG/ML SOLN
100.0000 mL | Freq: Once | INTRAVENOUS | Status: AC | PRN
  Administered 2021-02-09: 100 mL via INTRAVENOUS

## 2021-02-09 MED ORDER — STERILE WATER FOR INJECTION IV SOLN
INTRAVENOUS | Status: DC
  Filled 2021-02-09 (×4): qty 150

## 2021-02-09 MED ORDER — HYDROMORPHONE HCL 1 MG/ML IJ SOLN
0.5000 mg | INTRAMUSCULAR | Status: DC | PRN
  Administered 2021-02-09: 0.5 mg via INTRAVENOUS

## 2021-02-09 MED ORDER — OXYCODONE HCL 5 MG PO TABS
5.0000 mg | ORAL_TABLET | Freq: Four times a day (QID) | ORAL | Status: DC | PRN
  Administered 2021-02-09 – 2021-02-19 (×21): 5 mg via ORAL
  Filled 2021-02-09 (×22): qty 1

## 2021-02-09 MED ORDER — ACETAMINOPHEN 325 MG PO TABS
650.0000 mg | ORAL_TABLET | Freq: Once | ORAL | Status: AC
  Administered 2021-02-09: 650 mg via ORAL
  Filled 2021-02-09: qty 2

## 2021-02-09 MED ORDER — STERILE WATER FOR INJECTION IV SOLN
INTRAVENOUS | Status: DC
  Filled 2021-02-09 (×3): qty 150

## 2021-02-09 MED ORDER — SODIUM ZIRCONIUM CYCLOSILICATE 10 G PO PACK
10.0000 g | PACK | Freq: Three times a day (TID) | ORAL | Status: DC
  Administered 2021-02-09 (×2): 10 g via ORAL
  Filled 2021-02-09 (×2): qty 1

## 2021-02-09 MED ORDER — SODIUM CHLORIDE 0.9% FLUSH
10.0000 mL | Freq: Two times a day (BID) | INTRAVENOUS | Status: DC
  Administered 2021-02-09 – 2021-02-10 (×3): 10 mL
  Administered 2021-02-10: 20 mL
  Administered 2021-02-11 – 2021-03-05 (×42): 10 mL

## 2021-02-09 MED ORDER — PRISMASOL BGK 4/2.5 32-4-2.5 MEQ/L EC SOLN
Status: DC
  Administered 2021-02-10: 2000 mL via INTRAVENOUS_CENTRAL

## 2021-02-09 MED ORDER — HEPARIN SODIUM (PORCINE) 5000 UNIT/ML IJ SOLN
5000.0000 [IU] | Freq: Three times a day (TID) | INTRAMUSCULAR | Status: DC
  Administered 2021-02-09 – 2021-02-13 (×13): 5000 [IU] via SUBCUTANEOUS
  Filled 2021-02-09 (×12): qty 1

## 2021-02-09 MED ORDER — HYDROMORPHONE HCL 1 MG/ML IJ SOLN
INTRAMUSCULAR | Status: AC
  Filled 2021-02-09: qty 1

## 2021-02-09 MED ORDER — LACTATED RINGERS IV SOLN: INTRAVENOUS | Status: AC

## 2021-02-09 MED ORDER — SODIUM CHLORIDE 0.9 % IV SOLN
2.0000 g | Freq: Once | INTRAVENOUS | Status: AC
  Administered 2021-02-09: 2 g via INTRAVENOUS
  Filled 2021-02-09: qty 2

## 2021-02-09 MED ORDER — SODIUM BICARBONATE 8.4 % IV SOLN
50.0000 meq | Freq: Once | INTRAVENOUS | Status: AC
  Administered 2021-02-09: 50 meq via INTRAVENOUS
  Filled 2021-02-09: qty 50

## 2021-02-09 MED ORDER — HYDROMORPHONE HCL 1 MG/ML IJ SOLN
0.5000 mg | INTRAMUSCULAR | Status: DC | PRN
  Administered 2021-02-09 – 2021-02-20 (×56): 0.5 mg via INTRAVENOUS
  Filled 2021-02-09 (×2): qty 1
  Filled 2021-02-09 (×2): qty 0.5
  Filled 2021-02-09 (×3): qty 1
  Filled 2021-02-09: qty 0.5
  Filled 2021-02-09: qty 1
  Filled 2021-02-09: qty 0.5
  Filled 2021-02-09: qty 1
  Filled 2021-02-09 (×3): qty 0.5
  Filled 2021-02-09 (×2): qty 1
  Filled 2021-02-09: qty 0.5
  Filled 2021-02-09 (×2): qty 1
  Filled 2021-02-09: qty 0.5
  Filled 2021-02-09 (×2): qty 1
  Filled 2021-02-09 (×2): qty 0.5
  Filled 2021-02-09: qty 1
  Filled 2021-02-09 (×5): qty 0.5
  Filled 2021-02-09: qty 1
  Filled 2021-02-09: qty 0.5
  Filled 2021-02-09: qty 1
  Filled 2021-02-09 (×2): qty 0.5
  Filled 2021-02-09: qty 1
  Filled 2021-02-09: qty 0.5
  Filled 2021-02-09 (×4): qty 1
  Filled 2021-02-09: qty 0.5
  Filled 2021-02-09 (×2): qty 1
  Filled 2021-02-09 (×2): qty 0.5
  Filled 2021-02-09 (×2): qty 1
  Filled 2021-02-09 (×2): qty 0.5
  Filled 2021-02-09 (×6): qty 1

## 2021-02-09 MED ORDER — ORAL CARE MOUTH RINSE
15.0000 mL | Freq: Two times a day (BID) | OROMUCOSAL | Status: DC
  Administered 2021-02-09 – 2021-03-07 (×46): 15 mL via OROMUCOSAL

## 2021-02-09 MED ORDER — INSULIN ASPART 100 UNIT/ML IV SOLN
5.0000 [IU] | Freq: Once | INTRAVENOUS | Status: AC
  Administered 2021-02-09: 5 [IU] via INTRAVENOUS
  Filled 2021-02-09: qty 0.05

## 2021-02-09 MED ORDER — HYDROMORPHONE HCL 1 MG/ML IJ SOLN
1.0000 mg | INTRAMUSCULAR | Status: DC | PRN
  Administered 2021-02-09 (×2): 1 mg via INTRAVENOUS
  Filled 2021-02-09 (×2): qty 1

## 2021-02-09 MED ORDER — LACTATED RINGERS IV BOLUS (SEPSIS)
1000.0000 mL | Freq: Once | INTRAVENOUS | Status: AC
  Administered 2021-02-09: 1000 mL via INTRAVENOUS

## 2021-02-09 MED ORDER — MUPIROCIN 2 % EX OINT
1.0000 "application " | TOPICAL_OINTMENT | Freq: Two times a day (BID) | CUTANEOUS | Status: AC
  Administered 2021-02-09 – 2021-02-14 (×10): 1 via NASAL
  Filled 2021-02-09 (×2): qty 22

## 2021-02-09 MED ORDER — SODIUM CHLORIDE 0.9% FLUSH
10.0000 mL | INTRAVENOUS | Status: DC | PRN
  Administered 2021-02-14 – 2021-02-18 (×3): 10 mL

## 2021-02-09 MED ORDER — VANCOMYCIN HCL IN DEXTROSE 1-5 GM/200ML-% IV SOLN
1000.0000 mg | Freq: Once | INTRAVENOUS | Status: AC
  Administered 2021-02-09: 1000 mg via INTRAVENOUS
  Filled 2021-02-09: qty 200

## 2021-02-09 MED ORDER — POLYETHYLENE GLYCOL 3350 17 G PO PACK: 17.0000 g | PACK | Freq: Every day | ORAL | Status: DC | PRN

## 2021-02-09 MED ORDER — DEXMEDETOMIDINE HCL IN NACL 200 MCG/50ML IV SOLN
0.2000 ug/kg/h | INTRAVENOUS | Status: DC
  Administered 2021-02-09: 0.5 ug/kg/h via INTRAVENOUS
  Administered 2021-02-09: 0.4 ug/kg/h via INTRAVENOUS
  Administered 2021-02-10: 0.6 ug/kg/h via INTRAVENOUS
  Administered 2021-02-10 (×2): 0.5 ug/kg/h via INTRAVENOUS
  Administered 2021-02-10 (×2): 0.4 ug/kg/h via INTRAVENOUS
  Administered 2021-02-11: 0.5 ug/kg/h via INTRAVENOUS
  Administered 2021-02-11: 0.6 ug/kg/h via INTRAVENOUS
  Administered 2021-02-11: 0.9 ug/kg/h via INTRAVENOUS
  Administered 2021-02-11 (×2): 0.4 ug/kg/h via INTRAVENOUS
  Administered 2021-02-11 – 2021-02-12 (×2): 0.5 ug/kg/h via INTRAVENOUS
  Administered 2021-02-12: 0.6 ug/kg/h via INTRAVENOUS
  Filled 2021-02-09 (×8): qty 50
  Filled 2021-02-09: qty 150
  Filled 2021-02-09: qty 100
  Filled 2021-02-09 (×2): qty 50

## 2021-02-09 MED ORDER — SODIUM CHLORIDE 0.9 % IV BOLUS
1000.0000 mL | Freq: Once | INTRAVENOUS | Status: AC
  Administered 2021-02-09: 1000 mL via INTRAVENOUS

## 2021-02-09 MED ORDER — CHLORHEXIDINE GLUCONATE CLOTH 2 % EX PADS: 6.0000 | MEDICATED_PAD | Freq: Every day | CUTANEOUS | Status: DC

## 2021-02-09 MED ORDER — STERILE WATER FOR INJECTION IV SOLN
INTRAVENOUS | Status: DC
  Filled 2021-02-09 (×2): qty 150

## 2021-02-09 MED ORDER — SODIUM CHLORIDE 0.9 % FOR CRRT: INTRAVENOUS_CENTRAL | Status: DC | PRN

## 2021-02-09 MED ORDER — MORPHINE SULFATE (PF) 4 MG/ML IV SOLN
4.0000 mg | Freq: Once | INTRAVENOUS | Status: AC
  Administered 2021-02-09: 4 mg via INTRAVENOUS
  Filled 2021-02-09: qty 1

## 2021-02-09 MED ORDER — SODIUM CHLORIDE 0.9 % FOR CRRT
INTRAVENOUS_CENTRAL | Status: DC | PRN
Start: 2021-02-09 — End: 2021-02-13

## 2021-02-09 MED ORDER — PRISMASOL BGK 0/2.5 32-2.5 MEQ/L EC SOLN: Status: DC

## 2021-02-09 MED ORDER — HEPARIN SODIUM (PORCINE) 1000 UNIT/ML DIALYSIS: 1000.0000 [IU] | INTRAMUSCULAR | Status: DC | PRN

## 2021-02-09 MED ORDER — CHLORHEXIDINE GLUCONATE CLOTH 2 % EX PADS
6.0000 | MEDICATED_PAD | Freq: Every day | CUTANEOUS | Status: AC
  Administered 2021-02-10 – 2021-02-14 (×5): 6 via TOPICAL

## 2021-02-09 NOTE — Procedures (Signed)
Central Venous Catheter Insertion Procedure Note  Renee Wilkinson  [id number]  Sep 30, 1971  Date:02/09/21  Time:1:43 PM   Provider Performing:Pete Johnette Abraham Kary Kos   Procedure: Insertion of Non-tunneled Central Venous Catheter(36556)with US guidance [id number])    Indication(s) Hemodialysis  Consent Risks of the procedure as well as the alternatives and risks of each were explained to the patient and/or caregiver.  Consent for the procedure was obtained and is signed in the bedside chart  Anesthesia Topical only with 1% lidocaine   Timeout Verified patient identification, verified procedure, site/side was marked, verified correct patient position, special equipment/implants available, medications/allergies/relevant history reviewed, required imaging and test results available.  Sterile Technique Maximal sterile technique was not able to be achieved due to emergent nature of procedure.  Procedure Description Area of catheter insertion was cleaned with chlorhexidine and draped in sterile fashion.   With real-time ultrasound guidance a HD catheter was placed into the right internal jugular vein.  Nonpulsatile blood flow and easy flushing noted in all ports.  The catheter was sutured in place and sterile dressing applied.  Complications/Tolerance None; patient tolerated the procedure well. Chest X-ray is ordered to verify placement for internal jugular or subclavian cannulation.  Chest x-ray is not ordered for femoral cannulation.  EBL Minimal  Specimen(s) None  Erick Colace ACNP-BC [place] Pager # [phone] OR # [phone] if no answer

## 2021-02-09 NOTE — Sepsis Progress Note (Signed)
elink monitoring code sepsis.  

## 2021-02-09 NOTE — Progress Notes (Signed)
Effluent noted to be pink tinged after CRRT started, possibly myoglobin. Dr Justin Mend informed. No new orders, will continue to monitor.

## 2021-02-09 NOTE — Consult Note (Signed)
[facility] Renal Consultation Note  Requesting MD:  Indication for Consultation: Acute Kidney Injury, Metabolic acidosis and hyperkalemia  HPI:   Renee Wilkinson is a 50 y.o. female. With history of crack cocaine use and possible heroin. Husband was found dead in room with patient this morning with suspected overdose. She complains of chest pain and shortness of breath and numb sensation over her entire body.  In ER  She has a  Blood pressure of 129/96  Pulse 85 on room air.  She underwent CT angiogram with contrast of chest to rule out PE. Her lacate acid level was 4.4. She was given Vancomycin and Cefipime in ER. Her Creatinine 3.39  CK 50 000  AST 1411  ALT 467   Sodium 136  K 7.5   Bicarb 16   Cl 101   BUN 38  Ca 7.5   Hb 15.9   Wbc 30.6   Plt 370.    Creatinine, Ser  Date/Time Value Ref Range Status  02/09/2021 08:30 AM 3.39 (H) 0.44 - 1.00 mg/dL Final     PMHx:  History reviewed. No pertinent past medical history.   Family Hx: No family history on file.  Social History:  has no history on file for tobacco use, alcohol use, and drug use.  Allergies: No Known Allergies  Medications: Prior to Admission medications   Not on File     Labs:  Results for orders placed or performed during the hospital encounter of 02/09/21 (from the past 48 hour(s))  CBC with Differential     Status: Abnormal   Collection Time: 02/09/21  8:30 AM  Result Value Ref Range   WBC 30.6 (H) 4.0 - 10.5 K/uL   RBC 5.43 (H) 3.87 - 5.11 MIL/uL   Hemoglobin 15.9 (H) 12.0 - 15.0 g/dL   HCT 49.5 (H) 36.0 - 46.0 %   MCV 91.2 80.0 - 100.0 fL   MCH 29.3 26.0 - 34.0 pg   MCHC 32.1 30.0 - 36.0 g/dL   RDW 14.0 11.5 - 15.5 %   Platelets 370 150 - 400 K/uL   nRBC 0.2 0.0 - 0.2 %   Neutrophils Relative % 68 %   Neutro Abs 21.0 (H) 1.7 - 7.7 K/uL   Lymphocytes Relative 20 %   Lymphs Abs 6.0 (H) 0.7 - 4.0 K/uL   Monocytes Relative 8 %   Monocytes Absolute 2.5 (H) 0.1 - 1.0 K/uL    Eosinophils Relative 0 %   Eosinophils Absolute 0.0 0.0 - 0.5 K/uL   Basophils Relative 1 %   Basophils Absolute 0.2 (H) 0.0 - 0.1 K/uL   Immature Granulocytes 3 %   Abs Immature Granulocytes 0.82 (H) 0.00 - 0.07 K/uL    Comment: Performed at [facility], [place] [address] [place], [place] [address]  Comprehensive metabolic panel     Status: Abnormal   Collection Time: 02/09/21  8:30 AM  Result Value Ref Range   Sodium 136 135 - 145 mmol/L   Potassium >7.5 (HH) 3.5 - 5.1 mmol/L    Comment: NO VISIBLE HEMOLYSIS CRITICAL RESULT CALLED TO, READ BACK BY AND VERIFIED WITH: MESSICK, MD AT 1043 02/09/21 MULLINS,T    Chloride 101 98 - 111 mmol/L   CO2 16 (L) 22 - 32 mmol/L   Glucose, Bld 164 (H) 70 - 99 mg/dL    Comment: Glucose reference range applies only to samples taken after fasting for at least 8 hours.   BUN 38 (H) 6 -  20 mg/dL   Creatinine, Ser 3.39 (H) 0.44 - 1.00 mg/dL   Calcium 7.5 (L) 8.9 - 10.3 mg/dL   Total Protein 9.6 (H) 6.5 - 8.1 g/dL   Albumin 4.0 3.5 - 5.0 g/dL   AST 1,411 (H) 15 - 41 U/L   ALT 467 (H) 0 - 44 U/L   Alkaline Phosphatase 199 (H) 38 - 126 U/L   Total Bilirubin 0.7 0.3 - 1.2 mg/dL   GFR, Estimated 16 (L) >60 mL/min    Comment: (NOTE) Calculated using the CKD-EPI Creatinine Equation (2021)    Anion gap 19 (H) 5 - 15    Comment: Performed at [facility], [place] [address] [place], [place] [address]  Ethanol     Status: None   Collection Time: 02/09/21  8:30 AM  Result Value Ref Range   Alcohol, Ethyl (B) <10 <10 mg/dL    Comment: (NOTE) Lowest detectable limit for serum alcohol is 10 mg/dL.  For medical purposes only. Performed at [facility], [place] [address] [place], [place] [id number]   Salicylate level     Status: Abnormal   Collection Time: 02/09/21  8:30 AM  Result Value Ref Range   Salicylate Lvl [id number] (L) 7.0 - 30.0 mg/dL    Comment: Performed at [facility], [place]  [address], [place], [place] [address]  Troponin I (High Sensitivity)     Status: Abnormal   Collection Time: 02/09/21  8:30 AM  Result Value Ref Range   Troponin I (High Sensitivity) 3,709 (HH) <18 ng/L    Comment: CRITICAL RESULT CALLED TO, READ BACK BY AND VERIFIED WITH: WEST,S. RN AT 1036 02/09/21 MULLINS,T (NOTE) Elevated high sensitivity troponin I (hsTnI) values and significant  changes across serial measurements may suggest ACS but many other  chronic and acute conditions are known to elevate hsTnI results.  Refer to the Links section for chest pain algorithms and additional  guidance. Performed at [facility], [place] [address] [place], [place] [address]   CK     Status: Abnormal   Collection Time: 02/09/21  8:30 AM  Result Value Ref Range   Total CK >50,000 (H) 38 - 234 U/L    Comment: RESULTS CONFIRMED BY MANUAL DILUTION Performed at [facility] [place] [address], [place], [place] [address]   hCG, serum, qualitative     Status: None   Collection Time: 02/09/21  8:30 AM  Result Value Ref Range   Preg, Serum NEGATIVE NEGATIVE    Comment:        THE SENSITIVITY OF THIS METHODOLOGY IS >10 mIU/mL. Performed at [facility], [place] [address], [place], [place] [address]   Lactic acid, plasma     Status: Abnormal   Collection Time: 02/09/21  8:45 AM  Result Value Ref Range   Lactic Acid, Venous 4.4 (HH) 0.5 - 1.9 mmol/L    Comment: CRITICAL RESULT CALLED TO, READ BACK BY AND VERIFIED WITH: WEST,S. RN AT [phone] 02/09/21 MULLINS,T Performed at [facility], [place] [address], [place], [place] [address]   Magnesium     Status: Abnormal   Collection Time: 02/09/21  8:45 AM  Result Value Ref Range   Magnesium 3.1 (H) 1.7 - 2.4 mg/dL    Comment: Performed at [facility], [place] [address], [place], [place] [address]  Phosphorus     Status: Abnormal   Collection Time: 02/09/21  8:45 AM  Result  Value  Ref Range   Phosphorus 10.7 (H) 2.5 - 4.6 mg/dL    Comment: Performed at [facility], [place] [address], [place], [place] [address]  Blood gas, venous (at [facility] and [facility], not at [facility])     Status: Abnormal   Collection Time: 02/09/21 11:05 AM  Result Value Ref Range   pH, Ven 7.202 (L) 7.250 - 7.430   pCO2, Ven 38.8 (L) 44.0 - 60.0 mmHg   pO2, Ven 44.2 32.0 - 45.0 mmHg   Bicarbonate 14.7 (L) 20.0 - 28.0 mmol/L   Acid-base deficit 12.7 (H) 0.0 - 2.0 mmol/L   O2 Saturation 68.3 %   Patient temperature 98.6     Comment: Performed at [facility], [place] [address], [place], [place] [address]     ROS:  Patient was agitated    Physical Exam: Vitals:   02/09/21 1010 02/09/21 1048  BP: 127/87 (!) 129/96  Pulse: 83 85  Resp: (!) 23 15  Temp: 97.7 F (36.5 C)   SpO2: 90% 96%     General: obese  agitated HEENT: moist mucus membranes  intact Eyes: PERRL  EOMI Neck:  JVP not increased  No thyromegaly Heart: RRR  No rubs Lungs:clear  Tachypnea  Abdomen: soft generalized tender bs hypoactive Extremities: no cyanosis clubbing or edema Skin: no rashes noted Neuro:confused without deficit  Assessment/Plan: 1.Renal-  Acute Kidney Injury with CK 50 K  ,  CT abdomen showed wedge shaped decreased perfusion bilaterally  Urinalysis pending. Would be curious about drug screen but looks like history is significant for cocaine and possible heroin. This looks like possible accidental ingestion. Would aggressively support patient at this point with CRRT. Discussed with CCM and they are in agreement. Avoid further nephrotoxins  Will need regular renal panels 2 Hypertension/volume  -  Appears euvolemic at this point following fluid boluses  Would run CRRT even to avoid any hypotension 3. Hyperkalemia   Would treat with CRRT and follow carefully will need to adjust the K in dialysate as the K improves 4. Metabolic acidosis  Will use bicarbonate in replacement fluid 5.  Rhabdomyolysis  Supportive for now 6. Substance abuse  Will need psych and support once immediate life threatening situation has improved   Sherril Croon 02/09/2021, 11:33 AM

## 2021-02-09 NOTE — Progress Notes (Signed)
Using online resources I was able to discover that in June of 2021 the patient was prescribed a 30 day supply of the following with no refills by Libby Maw NP in [place]:  Pregabalin '25mg'$  BID Ibuprofen '600mg'$  Q6H PRN pain  Olanzapine '20mg'$  QHS Topiramate '50mg'$  once daily Trazodone '100mg'$  QHS PRN sleep  Please notify pharmacy if we can be of further help.  Romeo Rabon, PharmD Admin. Coordinator of Transitions of Care 02/09/2021,3:39 PM

## 2021-02-09 NOTE — ED Provider Notes (Signed)
[facility] DEPT Provider Note   CSN: [id number] Arrival date & time: 02/09/21  0737     History Chief Complaint  Patient presents with  . Shortness of Breath    Renee Wilkinson is a 50 y.o. female who presents via EMS for concern of shortness of breath after being found at a hotel with her husband this morning.  She states that someone gave her drugs last night that she thought was crack cocaine but she now believes to have been heroin which she snorted.  Per EMS, suspected that her husband overdosed, as he was found dead in the room with her this morning.  Patient is aware.  At this time she is complaining of pain in her chest with shortness of breath, as well as sensation of numbness in her back, abdomen, and total sensation of numbness in her entire left leg with decreased ability to move her leg.  She states the last time she used drugs prior to today was approximately 1 month, however told another provider that it was in October 2021.  Complaining of back, abdominal pain.  Additionally patient endorses suicidal ideation, plans to overdose on "pills" when she is released from the emergency department.  She does not know of any medical diagnoses she has been, given, however EMS stated she had DM and HTN.   LEVEL 5 CAVEAT due to acuity of patient's condition.   HPI     History reviewed. No pertinent past medical history.  There are no problems to display for this patient.      OB History   No obstetric history on file.     No family history on file.     Home Medications Prior to Admission medications   Not on File    Allergies    Patient has no known allergies.  Review of Systems   Review of Systems  Reason unable to perform ROS: ROS incomplete due to patient's agitation.  HENT: Negative.   Respiratory: Positive for shortness of breath.   Cardiovascular: Positive for chest pain.  Gastrointestinal: Positive for abdominal pain.  Negative for diarrhea, nausea and vomiting.  Musculoskeletal: Positive for back pain and myalgias.  Skin: Positive for color change.       Toes are purple on left foot.   Neurological: Positive for weakness and numbness.  Psychiatric/Behavioral: Positive for agitation and suicidal ideas. The patient is nervous/anxious and is hyperactive.     Physical Exam Updated Vital Signs BP (!) 129/96 (BP Location: Left Arm)   Pulse 85   Temp 97.7 F (36.5 C)   Resp 15   Ht '5\' 5"'$  (1.651 m)   Wt 104.3 kg   SpO2 96%   BMI 38.27 kg/m   Physical Exam Vitals and nursing note reviewed.  Constitutional:      Appearance: She is obese.  HENT:     Head: Normocephalic and atraumatic.     Nose: Nose normal.     Mouth/Throat:     Mouth: Mucous membranes are moist.     Dentition: Abnormal dentition.     Pharynx: Oropharynx is clear. Uvula midline. No oropharyngeal exudate or posterior oropharyngeal erythema.     Tonsils: No tonsillar exudate.  Eyes:     General: Lids are normal. Vision grossly intact.        Right eye: No discharge.        Left eye: No discharge.     Extraocular Movements: Extraocular movements intact.  Conjunctiva/sclera: Conjunctivae normal.     Pupils: Pupils are equal, round, and reactive to light.  Neck:     Trachea: Trachea and phonation normal.  Cardiovascular:     Rate and Rhythm: Normal rate and regular rhythm.     Pulses:          Dorsalis pedis pulses are 1+ on the right side and detected w/ Doppler on the left side.     Heart sounds: Normal heart sounds. No murmur heard.   Pulmonary:     Effort: Pulmonary effort is normal. Tachypnea present. No respiratory distress.     Breath sounds: Normal breath sounds. No wheezing or rales.  Chest:     Chest wall: No mass, tenderness or crepitus.  Abdominal:     General: Bowel sounds are normal. There is no distension.     Palpations: Abdomen is soft.     Tenderness: There is generalized abdominal tenderness and  tenderness in the epigastric area, periumbilical area, suprapubic area and left upper quadrant. There is no right CVA tenderness, left CVA tenderness, guarding or rebound.    Musculoskeletal:        General: No deformity.     Right shoulder: Normal.     Left shoulder: Normal.     Right upper arm: Normal.     Left upper arm: Normal.     Right elbow: Normal.     Left elbow: Normal.     Right forearm: Normal.     Left forearm: Normal.     Right wrist: Normal.     Left wrist: Normal.     Right hand: Normal.     Left hand: Normal.     Cervical back: Normal and neck supple. No rigidity, spasms, tenderness or crepitus. No pain with movement, spinous process tenderness or muscular tenderness.     Thoracic back: Tenderness present. No bony tenderness.     Lumbar back: Tenderness present. No bony tenderness.     Right hip: Normal.     Left hip: Normal.     Right upper leg: Normal.     Right knee: Normal.     Right lower leg: Normal. No edema.     Left lower leg: Normal. No edema.     Right ankle: Normal.     Right Achilles Tendon: Normal.     Left ankle: Normal.     Left Achilles Tendon: Normal.     Right foot: Normal.     Left foot: Decreased capillary refill. Abnormal pulse.       Feet:     Comments: 2/5 strength in plantar and dorsiflexion on the left foot, 5/5 on the right.  Decreased sensation in the entire left leg, most notable in the left foot - patient unable to feel painful stimuli in the left foot, left toes are dark purple at time of initial exam, becoming more pink over the first several minutes of the patient's ED stay.   Lymphadenopathy:     Cervical: No cervical adenopathy.  Skin:    General: Skin is warm and dry.     Capillary Refill: Capillary refill takes less than 2 seconds.  Neurological:     Mental Status: She is alert. She is confused.     Cranial Nerves: Cranial nerves are intact.     Sensory: Sensory deficit present.     Motor: Weakness present.      Comments: Neuro exam complicated by patient's agitation.   Psychiatric:  Mood and Affect: Mood normal.        Speech: Speech is rapid and pressured.        Behavior: Behavior is agitated and hyperactive.        Thought Content: Thought content includes suicidal ideation. Thought content does not include homicidal ideation. Thought content includes suicidal plan. Thought content does not include homicidal plan.     ED Results / Procedures / Treatments   Labs (all labs ordered are listed, but only abnormal results are displayed) Labs Reviewed  CBC WITH DIFFERENTIAL/PLATELET - Abnormal; Notable for the following components:      Result Value   WBC 30.6 (*)    RBC 5.43 (*)    Hemoglobin 15.9 (*)    HCT 49.5 (*)    Neutro Abs 21.0 (*)    Lymphs Abs 6.0 (*)    Monocytes Absolute 2.5 (*)    Basophils Absolute 0.2 (*)    Abs Immature Granulocytes 0.82 (*)    All other components within normal limits  COMPREHENSIVE METABOLIC PANEL - Abnormal; Notable for the following components:   Potassium >7.5 (*)    CO2 16 (*)    Glucose, Bld 164 (*)    BUN 38 (*)    Creatinine, Ser 3.39 (*)    Calcium 7.5 (*)    Total Protein 9.6 (*)    AST 1,411 (*)    ALT 467 (*)    Alkaline Phosphatase 199 (*)    GFR, Estimated 16 (*)    Anion gap 19 (*)    All other components within normal limits  SALICYLATE LEVEL - Abnormal; Notable for the following components:   Salicylate Lvl [id number] (*)    All other components within normal limits  CK - Abnormal; Notable for the following components:   Total CK >50,000 (*)    All other components within normal limits  LACTIC ACID, PLASMA - Abnormal; Notable for the following components:   Lactic Acid, Venous 4.4 (*)    All other components within normal limits  BLOOD GAS, VENOUS - Abnormal; Notable for the following components:   pH, Ven 7.202 (*)    pCO2, Ven 38.8 (*)    Bicarbonate 14.7 (*)    Acid-base deficit 12.7 (*)    All other components within  normal limits  MAGNESIUM - Abnormal; Notable for the following components:   Magnesium 3.1 (*)    All other components within normal limits  PHOSPHORUS - Abnormal; Notable for the following components:   Phosphorus 10.7 (*)    All other components within normal limits  TROPONIN I (HIGH SENSITIVITY) - Abnormal; Notable for the following components:   Troponin I (High Sensitivity) 3,709 (*)    All other components within normal limits  CULTURE, BLOOD (SINGLE)  RESP PANEL BY RT-PCR (FLU A&B, COVID) ARPGX2  ETHANOL  HCG, SERUM, QUALITATIVE  URINALYSIS, ROUTINE W REFLEX MICROSCOPIC  RAPID URINE DRUG SCREEN, HOSP PERFORMED  PREGNANCY, URINE  LACTIC ACID, PLASMA  RENAL FUNCTION PANEL  TROPONIN I (HIGH SENSITIVITY)    EKG EKG Interpretation  Date/Time:  Thursday February 09 2021 10:42:58 EDT Ventricular Rate:  82 PR Interval:    QRS Duration: 112 QT Interval:  409 QTC Calculation: 478 R Axis:   88 Text Interpretation: Sinus rhythm Borderline intraventricular conduction delay Confirmed by Dene Gentry [phone]) on 02/09/2021 10:48:15 AM   Radiology CT Head Wo Contrast  Result Date: 02/09/2021 CLINICAL DATA:  Lower extremity numbness. EXAM: CT HEAD WITHOUT CONTRAST TECHNIQUE: Contiguous axial  images were obtained from the base of the skull through the vertex without intravenous contrast. COMPARISON:  None. FINDINGS: Brain: Ventricles and sulci are normal in size and configuration. There is no intracranial mass, hemorrhage, extra-axial fluid collection, or midline shift. The brain parenchyma appears unremarkable. No appreciable acute infarct. Vascular: No hyperdense vessel. There is calcification in each carotid siphon region. Skull: Bony calvarium a appears intact. Sinuses/Orbits: Opacification noted in a posterior ethmoid air cell on the right. Orbits appear symmetric bilaterally. Other: Mastoid air cells are clear. IMPRESSION: Normal appearing brain parenchyma.  No mass or hemorrhage. Foci  of arterial vascular calcification noted. Opacification noted in a posterior right-sided ethmoid air cell. Electronically Signed   By: Lowella Grip III M.D.   On: 02/09/2021 11:01   DG Chest Portable 1 View  Result Date: 02/09/2021 CLINICAL DATA:  Shortness of breath EXAM: PORTABLE CHEST 1 VIEW COMPARISON:  Field FINDINGS: There is slight atelectasis left base. Lungs otherwise are clear. Heart size and pulmonary vascularity are normal. No adenopathy. No bone lesions. IMPRESSION: Mild left base atelectasis. Lungs elsewhere clear. Heart size normal. Electronically Signed   By: Lowella Grip III M.D.   On: 02/09/2021 08:55   CT Angio Chest/Abd/Pel for Dissection W and/or W/WO  Result Date: 02/09/2021 CLINICAL DATA:  Lower abdominal pain, left leg numbness. EXAM: CT ANGIOGRAPHY CHEST, ABDOMEN AND PELVIS TECHNIQUE: Non-contrast CT of the chest was initially obtained. Multidetector CT imaging through the chest, abdomen and pelvis was performed using the standard protocol during bolus administration of intravenous contrast. Multiplanar reconstructed images and MIPs were obtained and reviewed to evaluate the vascular anatomy. CONTRAST:  178m OMNIPAQUE IOHEXOL 350 MG/ML SOLN COMPARISON:  None. 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 Heart is normal size. Aorta is normal caliber. No dissection no filling defects in the central pulmonary arteries to suggest pulmonary emboli. Mediastinum/Nodes: No mediastinal, hilar, or axillary adenopathy. Trachea and esophagus are unremarkable. Thyroid unremarkable. Lungs/Pleura: Linear densities at the left base in the lingula, likely scarring. No confluent opacities or effusions. Musculoskeletal: Chest wall soft tissues are unremarkable. No acute bony abnormality. Review of the MIP images confirms the above findings. CTA ABDOMEN AND PELVIS FINDINGS VASCULAR Aorta: Normal caliber aorta without aneurysm,  dissection, vasculitis or significant stenosis. Celiac: Patent without evidence of aneurysm, dissection, vasculitis or significant stenosis. SMA: Patent without evidence of aneurysm, dissection, vasculitis or significant stenosis. Renals: Both renal arteries are patent. No proximal stenosis. Both renal arteries appear small caliber in the renal hila. IMA: Very small, difficult to visualize.  Appears patent. Inflow: Patent without evidence of aneurysm, dissection, vasculitis or significant stenosis. Veins: No obvious venous abnormality within the limitations of this arterial phase study. Review of the MIP images confirms the above findings. NON-VASCULAR Hepatobiliary: No focal hepatic abnormality. Gallbladder unremarkable. Pancreas: No focal abnormality or ductal dilatation. Spleen: No focal abnormality.  Normal size. Adrenals/Urinary Tract: There are areas of wedge-shaped decreased perfusion noted throughout both kidneys. There is no surrounding perinephric stranding. No hydronephrosis. Adrenal glands and urinary bladder unremarkable. Stomach/Bowel: Stomach, large and small bowel grossly unremarkable. Small infraumbilical ventral hernia containing a small bowel loop. No evidence of bowel obstruction. Lymphatic: Aortic atherosclerosis. No evidence of aneurysm or adenopathy. Reproductive: Uterus and adnexa unremarkable.  No mass. Other: No free fluid or free air. Musculoskeletal: No acute bony abnormality. Degenerative changes in the lower lumbar spine. Review of the MIP images confirms the above findings. IMPRESSION: No evidence of aortic aneurysm  or dissection. Proximal renal arteries are patent and free of disease. However, the vessels appear small caliber bilaterally within the renal hila. This is associated with extensive wedge-shaped areas of hypoperfusion throughout both kidneys. This is felt to most likely reflect some form of microvascular disease or vasoconstriction. 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 This is less likely pyelonephritis given the extensive nature of the finding and lack of perinephric stranding. Small infraumbilical ventral hernia containing small bowel loop. No bowel obstruction. These results were called by telephone at the time of interpretation on 02/09/2021 at 11:10 am to provider [facility] , who verbally acknowledged these results. Electronically Signed   By: Rolm Baptise M.D.   On: 02/09/2021 11:10    Procedures .Critical Care Performed by: Emeline Darling, PA-C Authorized by: Emeline Darling, PA-C   Critical care provider statement:    Critical care time (minutes):  45   Critical care was necessary to treat or prevent imminent or life-threatening deterioration of the following conditions:  Sepsis (rhabdomyolysis )   Critical care was time spent personally by me on the following activities:  Discussions with consultants, evaluation of patient's response to treatment, examination of patient, ordering and performing treatments and interventions, ordering and review of laboratory studies, ordering and review of radiographic studies, pulse oximetry, re-evaluation of patient's condition, obtaining history from patient or surrogate and review of old charts    Medications Ordered in ED Medications  lactated ringers infusion (has no administration in time range)  lactated ringers bolus 1,000 mL (1,000 mLs Intravenous New Bag/Given 02/09/21 1054)    And  lactated ringers bolus 800 mL (800 mLs Intravenous New Bag/Given 02/09/21 1112)  vancomycin (VANCOCIN) IVPB 1000 mg/200 mL premix (1,000 mg Intravenous New Bag/Given 02/09/21 1115)  heparin injection 1,000-6,000 Units (has no administration in time range)  sodium chloride 0.9 % primer fluid for CRRT (has no administration in time range)  sodium bicarbonate 150 mEq in sterile water 1,000 mL infusion (has no administration in time range)  sodium bicarbonate 150 mEq in sterile water  1,000 mL infusion (has no administration in time range)  prismasol BGK 0/2.5 infusion (has no administration in time range)  acetaminophen (TYLENOL) tablet 650 mg (650 mg Oral Given 02/09/21 0844)  sodium chloride 0.9 % bolus 1,000 mL (1,000 mLs Intravenous New Bag/Given 02/09/21 1052)  iohexol (OMNIPAQUE) 350 MG/ML injection 100 mL (100 mLs Intravenous Contrast Given 02/09/21 0956)  ceFEPIme (MAXIPIME) 2 g in sodium chloride 0.9 % 100 mL IVPB (0 g Intravenous Stopped 02/09/21 1112)  sodium bicarbonate injection 50 mEq (50 mEq Intravenous Given 02/09/21 1105)  insulin aspart (novoLOG) injection 5 Units (5 Units Intravenous Given 02/09/21 1109)    And  dextrose 50 % solution 50 mL (50 mLs Intravenous Given 02/09/21 1107)    ED Course  I have reviewed the triage vital signs and the nursing notes.  Pertinent labs & imaging results that were available during my care of the patient were reviewed by me and considered in my medical decision making (see chart for details).  Clinical Course as of 02/09/21 1132  Thu Feb 09, 2021  1043 Critical lab result, K >7.5. [RS]  1109 Consult to Dr. Justin Mend, nephrologist, who is agreeable to seeing this patient; he feels she would benefit from emergent CRRT.  I appreciate his collaboration in the care of this patient. [RS]  1109 Critical result called from radiologist, Dr. Ardeen Garland, with concern for extensive wedge-shaped areas of hypoperfusion throughout both  kidneys, which he feels is most like to review did not a microvascular vasoconstriction or disease concerning for extensive bilateral early renal infarcts.  Appreciate his collaboration care of this patient.  [RS]  [id number] Consult to Critical Care, Dr. Silas Flood, who is agreeable to seeing this patient and admitting her to his service. Will consult nephrology at his request. I appreciate his collaboration in the care of this patient.  [RS]    Clinical Course User Index [RS] Sponseller, Gypsy Balsam, PA-C   MDM  Rules/Calculators/A&P                         50 year old female with history of diabetes, hypertension, and substance abuse who presents via EMS for shortness of breath after being found next to deceased patient likely secondary to heroin overdose.  Patient was tachypneic on intake, vital signs otherwise normal.  Cardiopulmonary exam revealed tachypnea but was otherwise unremarkable, abdominal exam revealed generalized abdominal tenderness to palpation with firm infraumbilical mass on deep palpation, not pulsatile.  Neuro exam abnormal with decreased sensation and motor function in the left lower extremity as well changes in sensation on the abdomen and back.  Patient is extremely agitated, suicidal with plan to overdose on pills.  Toes on left foot were dark purplish, initial exam, pulse was identified with Doppler in the left foot, toes are improving in color after several minutes.  We will proceed with broad work-up given patient's concerning presentation and inability provide reliable history.  CBC with leukocytosis of nearly 31,000.  CMP with critical hyperkalemia greater than 7.5, hypermagnesemia of 3.1.  As well as AKI with creatinine of 3.3.  Concern for hypocalcemia of 7.5 as well as what transaminitis likely secondary to shock liver.  Total CK is greater than 50,000, troponin 3709, lactic acid significantly elevated at 4.4.  VBG with acidosis of 7.2, bicarb 14.7.  CT head negative for acute cranial abnormality; chest x-ray without pneumonia.  CTA chest abdomen pelvis concerning for bilateral renal infarcts likely secondary to microvascular vasoconstriction.  Consult placed to critical care and nephrology as above, for fulminant rhabdo with severe kidney injury and hyperkalemia.  Patient to be moved to the ICU urgently for emergent CRRT .  Appreciate collaboration of multiple specialists in the care of this patient.  Khalaya voiced understanding for medical evaluation and treatment plan.  Each  of her questions answered to her expressed aspect.  Patient is stable for transfer to the CCU at this time.  This chart was dictated using voice recognition software, Dragon. Despite the best efforts of this provider to proofread and correct errors, errors may still occur which can change documentation meaning.  Final Clinical Impression(s) / ED Diagnoses Final diagnoses:  None    Rx / DC Orders ED Discharge Orders    None       Aura Dials 02/09/21 1132    Valarie Merino, MD 02/10/21 [phone]

## 2021-02-09 NOTE — ED Triage Notes (Addendum)
Ems bring pt in for shortness of breath. Pt states she thinks she used crack cocaine last night at 1700. Pt complains of left leg numbness and left lower abdominal pain and numbness and back pain. Denies falls/injury. Pt also reports waking up this morning feeling suicidal with a plan to take pills.

## 2021-02-09 NOTE — Sepsis Progress Note (Signed)
Per secure chat with RN Tamala Fothergill, blood cultures were drawn prior to ABx admin, despite it showing the opposite in charting.  Per chat, blood cultures are labeled with time prior to ABx admin as well.

## 2021-02-09 NOTE — H&P (Addendum)
NAME:  Renee Wilkinson, MRN:  [id number], DOB:  04/15/1971, LOS: 0 ADMISSION DATE:  02/09/2021, CONSULTATION DATE:  02/09/21 REFERRING MD:  ED CHIEF COMPLAINT:  Belly pain   Brief History:  50 year old admits to cocaine use who was found down beside that husband admitted with hyperkalemia, renal failure, shock liver with chief complaint of abdominal pain with imaging consistent with renal infarcts.  History of Present Illness:  Patient able to provide significant history as she reports pain consistently.  She also reports a thirsty she is.  She does admit that yesterday she used a substance that she had stayed away from for a long time.  She admits to smoking cocaine.  She denies any IV drug use.  She is tearful as she says her husband is dead and was beside her when she was found by EMS.  She was transported to the ED.  Lab data from her creatinine over 3, potassium undetectably high, CK of 50,000, LFTs in the thousands.  She had a CT abdomen pelvis chest which showed no dissection or aneurysm, clear lungs, bilateral renal infarcts.  She was given IV fluids.  She was given vancomycin and cefepime.  Lactic acid was elevated.  Trended down.  PCCM was consulted for admission given multiple medical issues.  Nephrology was consulted recommended CRRT with which I agree.  She will be admitted to the ICU for emergent dialysis for hyperkalemia.  Past Medical History:  Psychosis admitted [facility] 08/[facility]  Significant Hospital Events:  3/17 admitted renal failure, hyperkalemia, CRRT started  Consults:  PCCM Nephrology  Procedures:  3/17 HD catheter  Significant Diagnostic Tests:  02/09/21 CT Chest Abd pelvis with renal infarcts, small infraumbilical hernia  Micro Data:  Blood cx pending, UA, Ucx ordered  Antimicrobials:  Vancomycin 3/17  Interim History / Subjective:  As above  Objective   Blood pressure (!) 129/96, pulse 85, temperature 97.7 F (36.5 C), resp. rate 15, height '5\' 5"'$   (1.651 m), weight 104.3 kg, SpO2 96 %.       No intake or output data in the 24 hours ending 02/09/21 1202 Filed Weights   02/09/21 0755  Weight: 104.3 kg    Examination: General: older than stated age, in pain Eyes: EOMI, no icterus Neck:: Supple, No JVP Lungs: CTAB, no wheeze or crackle.  Cardiovascular: RRR, no murmurs, no edema Abdomen: ND, TTP throughout MSK: no synovitis, no joint effusion Neuro: no focal deficits, sensation intact Psych: tearful, animated, full affect  Resolved Hospital Problem list   n/a  Assessment & Plan:  Acute Kidney Failure: Multifactorial, rhabdomyolysis given CK greater than 50 K, hypovolemia as was found down not taking p.o., contribution of what appears to be renal infarct presumably due to vasospasm and cocaine use, necessary use of nephrotoxins such as vancomycin, IV contrast for CT scans. --Nephrology consult, CRRT emergently primarily for hyperkalemia  Hyperkalemia: Potassium undetectably high.  Presumably in the setting of renal failure, acutely worsened in the setting of infarction, cocaine vasospasm. --Status post insulin/D50 --Lokelma ordered --Emergent CRRT  Abdominal pain: Most likely due to kidney infarcts. --Fentanyl as needed, oxycodone as needed --Avoiding Tylenol in the setting of shock liver, see below  Elevated LFTs: Presumably shock liver, question underlying hepatic dysfunction.  Likely in the setting of vasospasm, hypokalemia. --Avoid hepatotoxins --Avoiding Tylenol --Continue to monitor  Substance abuse: Admits to cocaine use, presumably narcotics as well whether intentional or cocaine was laced with something as responded to Narcan. --Closely monitor, judicious use of narcotics,  other centrally acting meds  Rhabdomyolysis: Contributing renal failure. --Continuous IV fluids  Leukocytosis: Presumably reactive, leukemoid in the setting of kidney infarct.  No clear source of infection.  Substance use does put her at  risk of gram-positive infections.  She denies IV drug use. --Status post vancomycin and cefepime, monitor levels --Follow blood cultures, UA pending, CT chest is clear of infection  Anion gap metabolic acidosis: In setting of acute renal failure, lactic acidosis. --Hydration  Troponinemia: Suspect supply demand mismatch in the setting of cocaine use, hypotension, hypovolemia.  Possible cocaine induced vasospasm.  Elevated at 4500, continue to monitor and trend. 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)  Diet: Regular Pain/Anxiety/Delirium protocol (if indicated): fentanyl PRN, oxy PRN VAP protocol (if indicated): n/a DVT prophylaxis: Heparin SQ GI prophylaxis: n/a Glucose control: SSI Mobility: Bedrest while on CRRT, advance as tolerated Disposition:ICU  Goals of Care:  Last date of multidisciplinary goals of care discussion: Family and staff present:  Summary of discussion:  Follow up goals of care discussion due:  Code Status: full  Labs   CBC: Recent Labs  Lab 02/09/21 0830  WBC 30.6*  NEUTROABS 21.0*  HGB 15.9*  HCT 49.5*  MCV 91.2  PLT 0000000    Basic Metabolic Panel: Recent Labs  Lab 02/09/21 0830 02/09/21 0845  NA 136  --   K >7.5*  --   CL 101  --   CO2 16*  --   GLUCOSE 164*  --   BUN 38*  --   CREATININE 3.39*  --   CALCIUM 7.5*  --   MG  --  3.1*  PHOS  --  10.7*   GFR: Estimated Creatinine Clearance: 23.8 mL/min (A) (by C-G formula based on SCr of 3.39 mg/dL (H)). Recent Labs  Lab 02/09/21 0830 02/09/21 0845 02/09/21 1108  WBC 30.6*  --   --   LATICACIDVEN  --  4.4* 4.0*    Liver Function Tests: Recent Labs  Lab 02/09/21 0830  AST 1,411*  ALT 467*  ALKPHOS 199*  BILITOT 0.7  PROT 9.6*  ALBUMIN 4.0   No results for input(s): LIPASE, AMYLASE in the last 168 hours. No results for input(s): AMMONIA in the last 168 hours.  ABG    Component Value Date/Time   HCO3 14.7 (L) 02/09/2021 1105   ACIDBASEDEF 12.7 (H) 02/09/2021  1105   O2SAT 68.3 02/09/2021 1105     Coagulation Profile: No results for input(s): INR, PROTIME in the last 168 hours.  Cardiac Enzymes: Recent Labs  Lab 02/09/21 0830  CKTOTAL >50,000*    HbA1C: No results found for: HGBA1C  CBG: No results for input(s): GLUCAP in the last 168 hours.  Review of Systems:   Unable to obtain, patient in extremis due to pain   Past Medical History:  Unable to obtain, patient in extremis due to pain   Surgical History:  Unable to obtain, patient in extremis due to pain  Social History:      Family History:  Her family history is not on file.   Allergies No Known Allergies   Home Medications  Prior to Admission medications   Not on File     Critical care time:      CRITICAL CARE Performed by: Lanier Clam   Total critical care time: 45 minutes  Critical care time was exclusive of separately billable procedures and treating other patients.  Critical care was necessary to treat or prevent imminent or life-threatening deterioration.  Critical  care was time spent personally by me on the following activities: development of treatment plan with patient and/or surrogate as well as nursing, discussions with consultants, evaluation of patient's response to treatment, examination of patient, obtaining history from patient or surrogate, ordering and performing treatments and interventions, ordering and review of laboratory studies, ordering and review of radiographic studies, pulse oximetry and re-evaluation of patient's condition.

## 2021-02-09 NOTE — Progress Notes (Addendum)
eLink Physician-Brief Progress Note Patient Name: Renee Wilkinson DOB: 14-Nov-1971 MRN: [id number]   Date of Service  02/09/2021  HPI/Events of Note  Patient admitted with rhabdomyolysis, AKI, hyperkalemia in the context of substance abuse involving cocaine laced with unknown substances. She is having severe situational anxiety and agitation.  eICU Interventions  Precedex infusion ordered.        Kerry Kass Skyy Mcknight 02/09/2021, 8:55 PM

## 2021-02-09 NOTE — Progress Notes (Signed)
eLink Physician-Brief Progress Note Patient Name: Renee Wilkinson DOB: 04-07-1971 MRN: [id number]   Date of Service  02/09/2021  HPI/Events of Note  Patient is now on CRRT, last K+ was 5.1.  eICU Interventions  Lokelma discontinued.        Kerry Kass Odel Schmid 02/09/2021, 10:29 PM

## 2021-02-09 NOTE — Progress Notes (Signed)
K improved to 5.1  Continue  Bicarbonate replacement fluid  Change dialysate to 4/2.5

## 2021-02-09 NOTE — ED Notes (Signed)
Patient is off the floor for scan

## 2021-02-09 NOTE — Progress Notes (Signed)
A consult was received from an ED physician for vancomycin and cefepime per pharmacy dosing.  The patient's profile has been reviewed for ht/wt/allergies/indication/available labs.    A one time order has been placed for vancomycin '1000mg'$  IV and cefepime 2gm IV x1.  Further antibiotics/pharmacy consults should be ordered by admitting physician if indicated.                       Thank you, Lynelle Doctor 02/09/2021  10:25 AM

## 2021-02-10 ENCOUNTER — Other Ambulatory Visit: Payer: Self-pay

## 2021-02-10 ENCOUNTER — Encounter (HOSPITAL_COMMUNITY): Payer: Self-pay | Admitting: Pulmonary Disease

## 2021-02-10 ENCOUNTER — Inpatient Hospital Stay (HOSPITAL_COMMUNITY): Payer: Medicaid - Out of State

## 2021-02-10 DIAGNOSIS — N179 Acute kidney failure, unspecified: Secondary | ICD-10-CM | POA: Diagnosis not present

## 2021-02-10 DIAGNOSIS — Z452 Encounter for adjustment and management of vascular access device: Secondary | ICD-10-CM | POA: Diagnosis not present

## 2021-02-10 LAB — CBC
HCT: 38.1 % (ref 36.0–46.0)
Hemoglobin: 12.6 g/dL (ref 12.0–15.0)
MCH: 28.8 pg (ref 26.0–34.0)
MCHC: 33.1 g/dL (ref 30.0–36.0)
MCV: 87.2 fL (ref 80.0–100.0)
Platelets: 201 10*3/uL (ref 150–400)
RBC: 4.37 MIL/uL (ref 3.87–5.11)
RDW: 14 % (ref 11.5–15.5)
WBC: 21.8 10*3/uL — ABNORMAL HIGH (ref 4.0–10.5)
nRBC: 0.1 % (ref 0.0–0.2)

## 2021-02-10 LAB — RENAL FUNCTION PANEL
Albumin: 2.9 g/dL — ABNORMAL LOW (ref 3.5–5.0)
Albumin: 2.7 g/dL — ABNORMAL LOW (ref 3.5–5.0)
Anion gap: 13 (ref 5–15)
Anion gap: 10 (ref 5–15)
BUN: 34 mg/dL — ABNORMAL HIGH (ref 6–20)
BUN: 29 mg/dL — ABNORMAL HIGH (ref 6–20)
CO2: 26 mmol/L (ref 22–32)
CO2: 29 mmol/L (ref 22–32)
Calcium: 6.8 mg/dL — ABNORMAL LOW (ref 8.9–10.3)
Calcium: 6.8 mg/dL — ABNORMAL LOW (ref 8.9–10.3)
Chloride: 91 mmol/L — ABNORMAL LOW (ref 98–111)
Chloride: 93 mmol/L — ABNORMAL LOW (ref 98–111)
Creatinine, Ser: 2.58 mg/dL — ABNORMAL HIGH (ref 0.44–1.00)
Creatinine, Ser: 2.18 mg/dL — ABNORMAL HIGH (ref 0.44–1.00)
GFR, Estimated: 22 mL/min — ABNORMAL LOW (ref >60)
GFR, Estimated: 27 mL/min — ABNORMAL LOW (ref >60)
Glucose, Bld: 166 mg/dL — ABNORMAL HIGH (ref 70–99)
Glucose, Bld: 173 mg/dL — ABNORMAL HIGH (ref 70–99)
Phosphorus: 5.8 mg/dL — ABNORMAL HIGH (ref 2.5–4.6)
Phosphorus: 4.4 mg/dL (ref 2.5–4.6)
Potassium: 5.2 mmol/L — ABNORMAL HIGH (ref 3.5–5.1)
Potassium: 4.8 mmol/L (ref 3.5–5.1)
Sodium: 130 mmol/L — ABNORMAL LOW (ref 135–145)
Sodium: 132 mmol/L — ABNORMAL LOW (ref 135–145)

## 2021-02-10 LAB — COMPREHENSIVE METABOLIC PANEL
ALT: 524 U/L — ABNORMAL HIGH (ref 0–44)
AST: 1303 U/L — ABNORMAL HIGH (ref 15–41)
Albumin: 2.8 g/dL — ABNORMAL LOW (ref 3.5–5.0)
Alkaline Phosphatase: 124 U/L (ref 38–126)
Anion gap: 13 (ref 5–15)
BUN: 36 mg/dL — ABNORMAL HIGH (ref 6–20)
CO2: 25 mmol/L (ref 22–32)
Calcium: 6.7 mg/dL — ABNORMAL LOW (ref 8.9–10.3)
Chloride: 91 mmol/L — ABNORMAL LOW (ref 98–111)
Creatinine, Ser: 2.54 mg/dL — ABNORMAL HIGH (ref 0.44–1.00)
GFR, Estimated: 22 mL/min — ABNORMAL LOW (ref >60)
Glucose, Bld: 166 mg/dL — ABNORMAL HIGH (ref 70–99)
Potassium: 5.2 mmol/L — ABNORMAL HIGH (ref 3.5–5.1)
Sodium: 129 mmol/L — ABNORMAL LOW (ref 135–145)
Total Bilirubin: 0.9 mg/dL (ref 0.3–1.2)
Total Protein: 7 g/dL (ref 6.5–8.1)

## 2021-02-10 LAB — BASIC METABOLIC PANEL
Anion gap: 13 (ref 5–15)
Anion gap: 11 (ref 5–15)
BUN: 31 mg/dL — ABNORMAL HIGH (ref 6–20)
BUN: 26 mg/dL — ABNORMAL HIGH (ref 6–20)
CO2: 27 mmol/L (ref 22–32)
CO2: 25 mmol/L (ref 22–32)
Calcium: 6.9 mg/dL — ABNORMAL LOW (ref 8.9–10.3)
Calcium: 7.1 mg/dL — ABNORMAL LOW (ref 8.9–10.3)
Chloride: 91 mmol/L — ABNORMAL LOW (ref 98–111)
Chloride: 91 mmol/L — ABNORMAL LOW (ref 98–111)
Creatinine, Ser: 2.22 mg/dL — ABNORMAL HIGH (ref 0.44–1.00)
Creatinine, Ser: 2.46 mg/dL — ABNORMAL HIGH (ref 0.44–1.00)
GFR, Estimated: 26 mL/min — ABNORMAL LOW (ref >60)
GFR, Estimated: 23 mL/min — ABNORMAL LOW (ref >60)
Glucose, Bld: 172 mg/dL — ABNORMAL HIGH (ref 70–99)
Glucose, Bld: 177 mg/dL — ABNORMAL HIGH (ref 70–99)
Potassium: 4.6 mmol/L (ref 3.5–5.1)
Potassium: 4.9 mmol/L (ref 3.5–5.1)
Sodium: 131 mmol/L — ABNORMAL LOW (ref 135–145)
Sodium: 127 mmol/L — ABNORMAL LOW (ref 135–145)

## 2021-02-10 LAB — MAGNESIUM: Magnesium: 2.3 mg/dL (ref 1.7–2.4)

## 2021-02-10 IMAGING — US US ABDOMEN LIMITED RUQ/ASCITES
1 series · 14 of 25 positions shown · non-contrast
Comparison: CTA chest and abdomen yesterday.

CLINICAL DATA: 49-year-old female with right upper quadrant
abdominal pain. Dialysis patient.

EXAM:
ULTRASOUND ABDOMEN LIMITED RIGHT UPPER QUADRANT

[Series 1: us abdomen limited ruq/ascites · 14 of 39 slices shown]
[im 1/39]
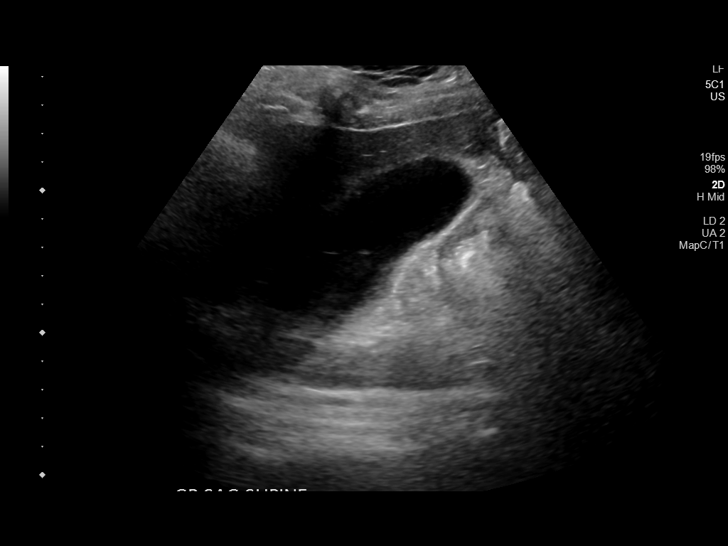
[im 4/39]
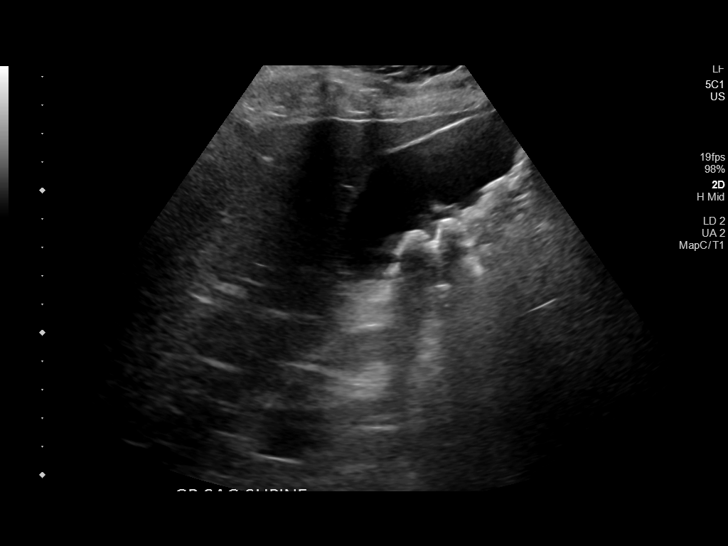
[im 7/39]
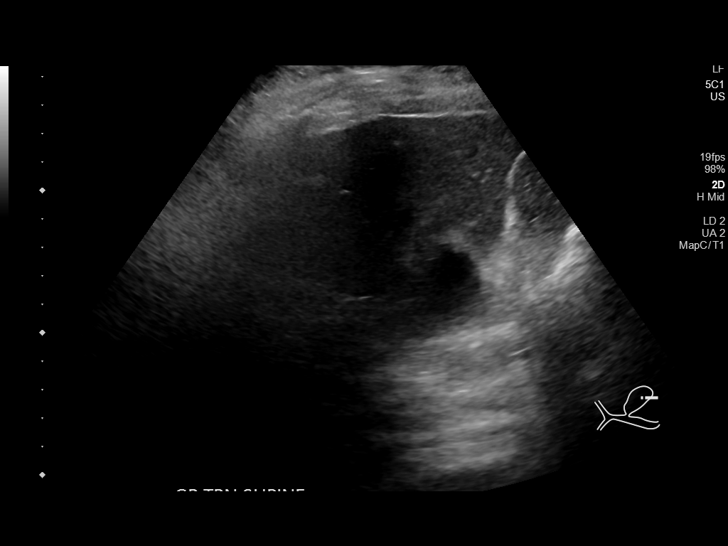
[im 10/39]
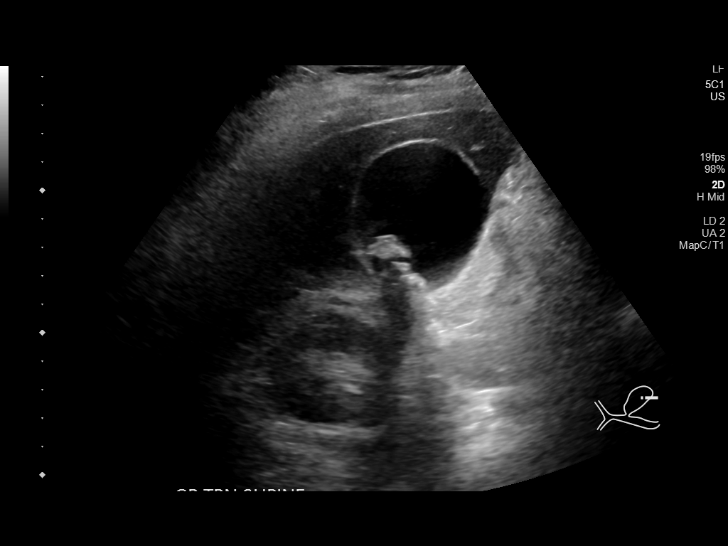
[im 13/39]
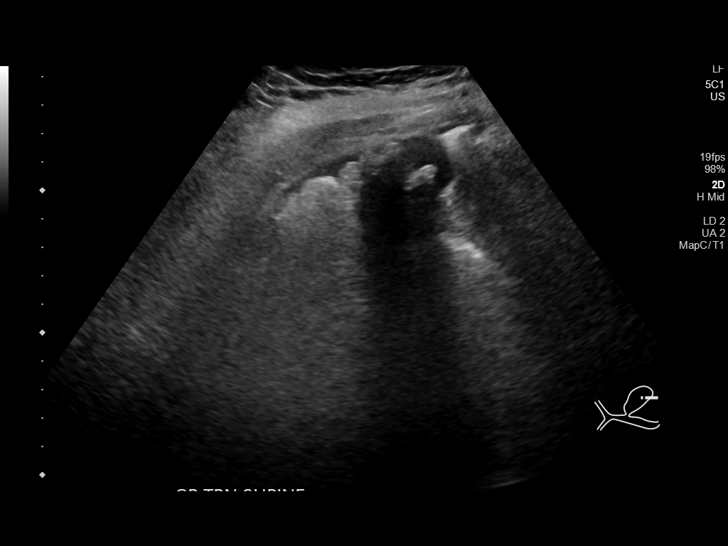
[im 15/39]
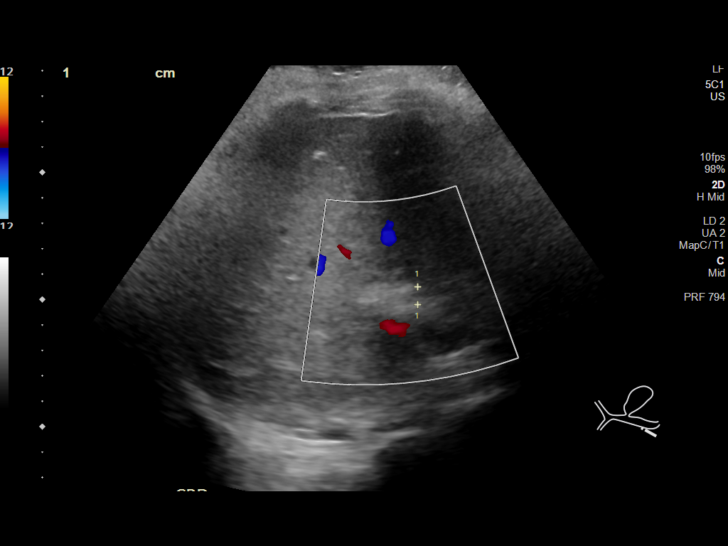
[im 18/39]
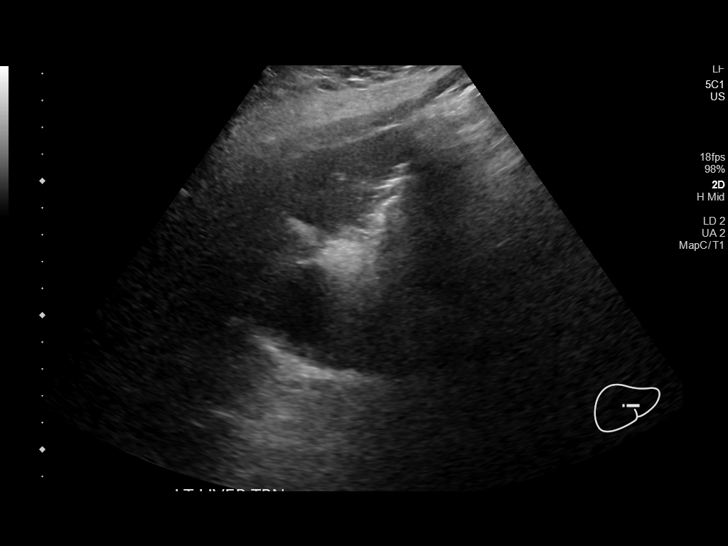
[im 21/39]
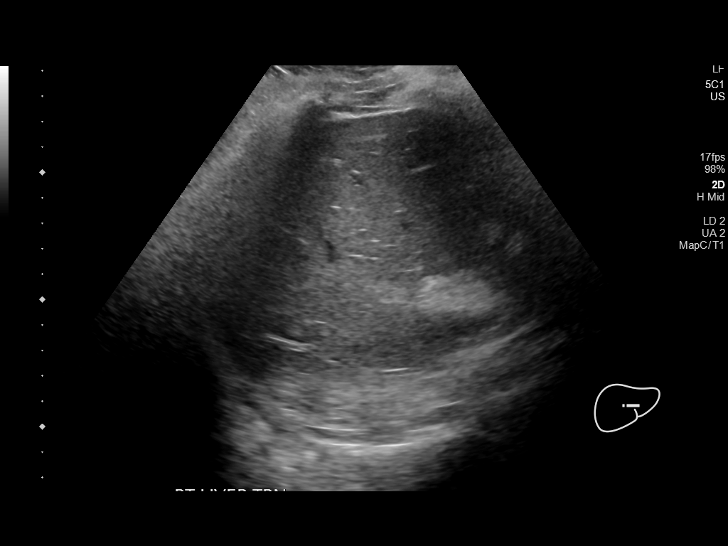
[im 24/39]
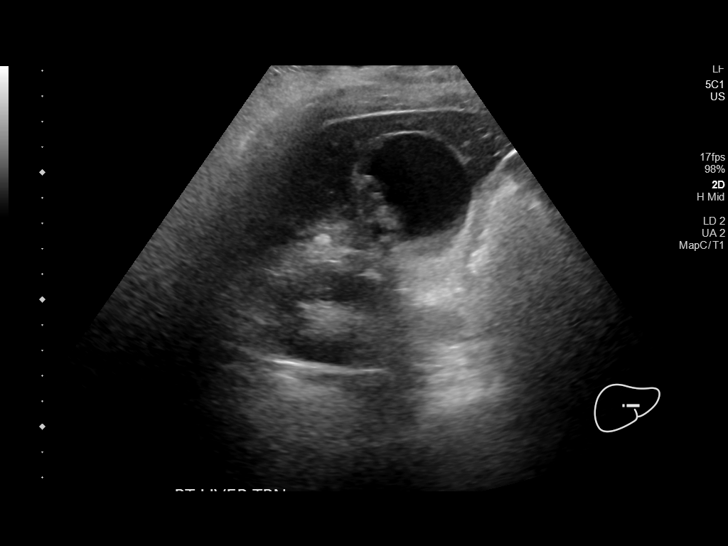
[im 26/39]
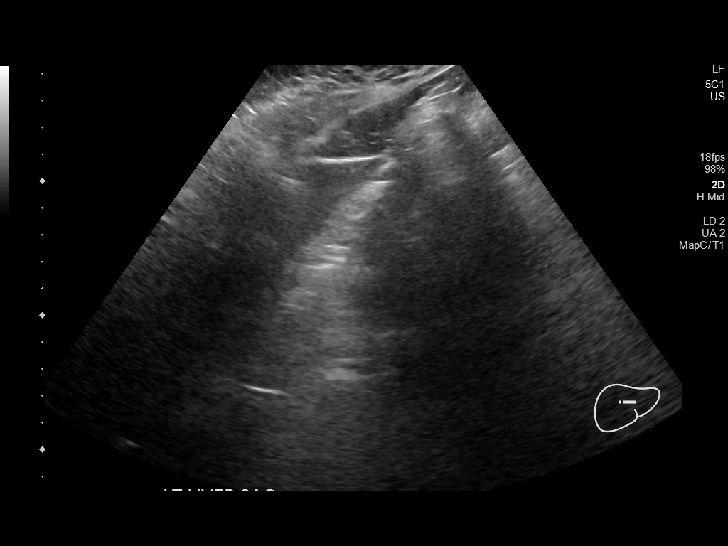
[im 29/39]
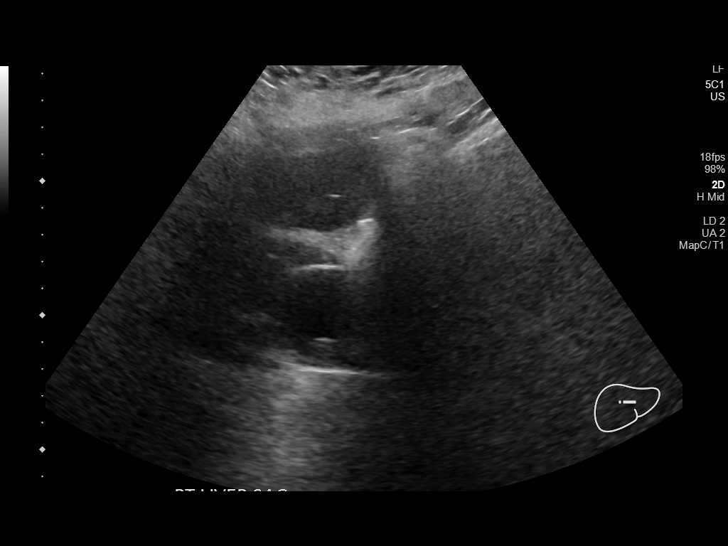
[im 32/39]
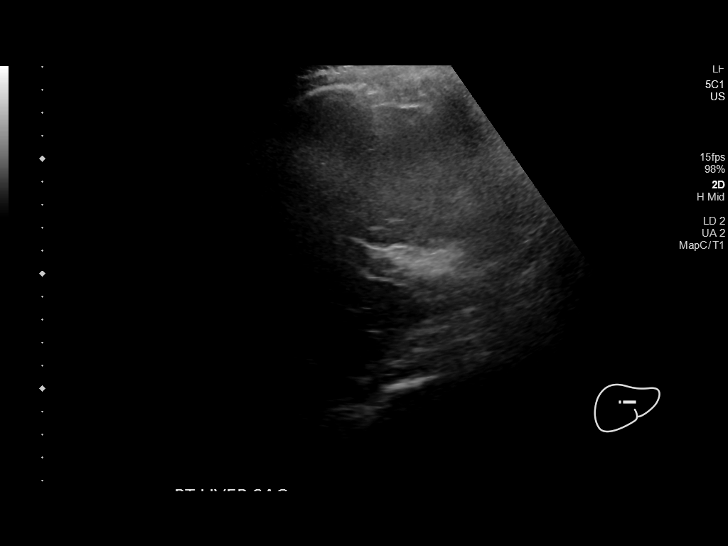
[im 35/39]
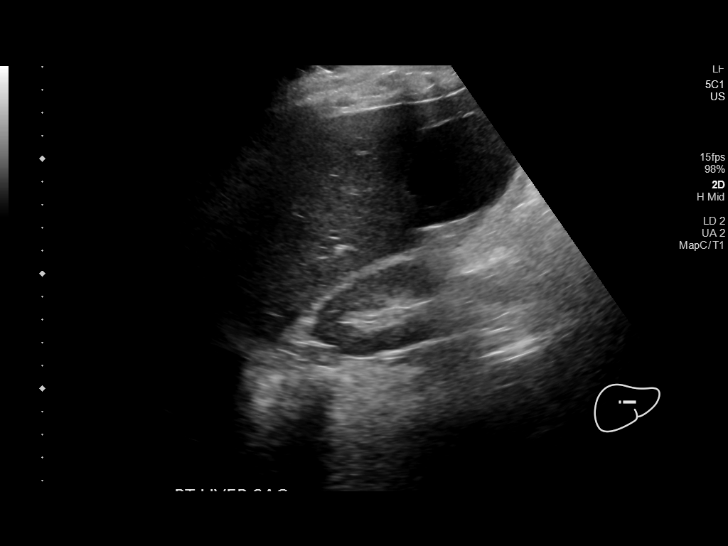
[im 39/39]
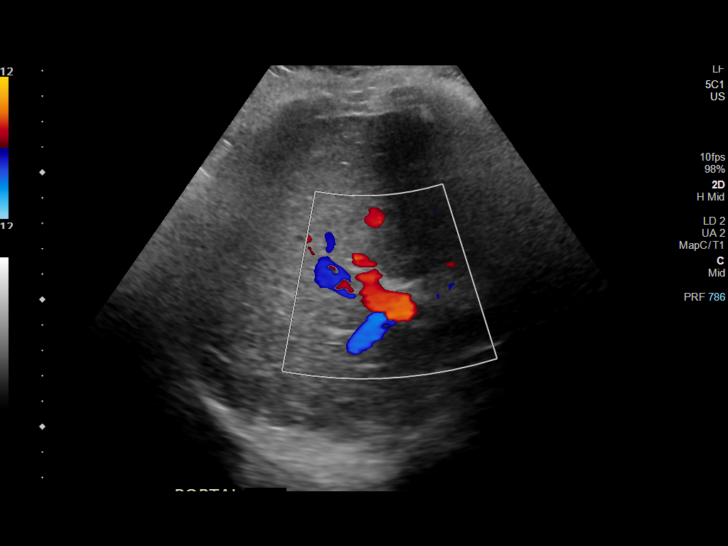

[14 of 25 positions shown; findings below may reference images not displayed]

FINDINGS: Gallbladder:

Shadowing echogenic gallstones (image 5) individual stone size
estimated at 15-20 mm. Gallbladder wall thickness remains normal. No
pericholecystic fluid. Some superimposed gallbladder sludge.

However, positive sonographic Murphy sign is reported.

Common bile duct:

Diameter: 6-7 mm, upper limits of normal.

Liver:

No focal lesion identified. Within normal limits in parenchymal
echogenicity. Portal vein is patent on color Doppler imaging with
normal direction of blood flow towards the liver.

Other: No free fluid.
IMPRESSION: 1. Difficult to exclude acute cholecystitis: positive for
gallstones, sludge, and a sonographic Murphy Sign. But there is no
evidence of gallbladder wall thickening.
2. Upper limits of normal CBD.  Liver parenchyma appears negative.

## 2021-02-10 MED ORDER — LACTATED RINGERS IV SOLN: INTRAVENOUS | Status: DC

## 2021-02-10 MED ORDER — PRISMASOL BGK 4/2.5 32-4-2.5 MEQ/L REPLACEMENT SOLN: Status: DC

## 2021-02-10 MED ORDER — HEPARIN SODIUM (PORCINE) 1000 UNIT/ML DIALYSIS
1000.0000 [IU] | INTRAMUSCULAR | Status: DC | PRN
  Administered 2021-02-11 (×2): 3000 [IU] via INTRAVENOUS_CENTRAL
  Filled 2021-02-10 (×4): qty 6

## 2021-02-10 MED ORDER — BELLADONNA ALKALOIDS-OPIUM 16.2-60 MG RE SUPP
1.0000 | Freq: Three times a day (TID) | RECTAL | Status: DC | PRN
Start: 2021-02-10 — End: 2021-03-07

## 2021-02-10 NOTE — Progress Notes (Signed)
[facility] PCCM  Called to bedside due to worsening left foot palor, mottling. On exam: mottling left foot; no sensation to light touch in the left leg, she can feel deep palpation/pressure, no cutaneous sensation from foot to mid thigh in left leg.  DP/PT pulses left leg weak, still present; skin not taught, significant adipose tissue noted.  Given loss of sensation in leg with significant rhabdomyolysis, I'm concerned she may have compartment syndrome in the left leg.   Have discussed with orthopedics: plan elevation of the leg, ice packs around thigh now  [name], MD [place] Pager: [phone] Cell: [phone] If no response, please call [phone] until 7pm After 7:00 pm call Elink  [phone]

## 2021-02-10 NOTE — Progress Notes (Signed)
eLink Physician-Brief Progress Note Patient Name: Renee Wilkinson DOB: 1971-01-28 MRN: [id number]   Date of Service  02/10/2021  HPI/Events of Note  Bladder spasm  eICU Interventions  Plan: 1. B&O Suppository PR Q 8 hours PRN bladder spasm.      Intervention Category Major Interventions: Other:  Lysle Dingwall 02/10/2021, 10:52 PM

## 2021-02-10 NOTE — Consult Note (Signed)
[facility] Face-to-Face Psychiatry Consult   Reason for Consult: SUicidal thoughts Referring Physician:  Dr. Lake Bells Patient Identification: Renee Wilkinson MRN:  [id number] Principal Diagnosis: <principal problem not specified> Diagnosis:  Active Problems:   Renal failure   Encounter for central line placement   Total Time spent with patient: 45 minutes  Subjective:   Renee Wilkinson is a 50 y.o. female patient admitted with hyperkalemia, renal failure, shock liver, abdominal pain with imaging consistent with a renal infarcts, never required intubation and CRRT.  Patient is seen and assessed by this nurse practitioner.  Patient is a poor historian, and unable to recall events leading up to hospitalization.  Patient does report using IV heroin, prior to waking up and being found down on the floor.  She states when she awoke she was beside her husband who was dead.  She is unable to recall how she ended up on the floor underneath the bed, although she is states she was unable to move her legs and laid there for hours.  Reports a previous psychiatric history of schizophrenia, depression, anxiety.  Most recent hospitalization in June 2021 at [facility] for new onset of psychosis.  She reports at that time she was started on olanzapine and Vistaril.  She reports she was stable on those medications, up until she relocated to [place] with her husband of 32 years.  She reports she stopped taking the medications 1 month ago due to financial barriers.  She reports previous hospitalizations in [place], however most were due to noncompliance of psychotropic medication.  She denies any history of suicide attempts.  At this present time she does endorse suicidal thoughts, with no plan, or intention.  She is also able to contract for safety.  On evaluation patient is alert and oriented, tearful and anxious, distraught throughout the evaluation.  Patient is noted to be on moderate sedation (Precedex)  during evaluation.  Consent is obtained as she does agree to continue with psych evaluation, although she was distraught, tearful, and upset.  Patient presents with much difficulty to participate in evaluation due to her current presentation, as she continues to ruminate on her husband's death.  She is also apologetic and remorseful, as she is unable to recall events leading up to her hospitalization and his death.  Patient does admit to recent use of heroin, although her urine drug screen was negative for such.  Chart reviewed states patient used cocaine, urine drug screen also negative.  Patient does not present with any current psychosis, hallucinations, paranoia.  She is able to offer comparison for her previous admission to [facility] where she was exhibiting psychosis, and states she feels mentally stable albeit considering her circumstances.  Patient denies any previous history of suicide attempt.  Patient denies any access to weapons, denies any alcohol.  Patient denies any auditory and/or visual hallucinations, does not appear to be responding to internal or external stimuli.  There is no evidence of delusional thought content and patient appears to answer all questions appropriately.  At this time patient appears to be stable to psychiatrically clear, with support system services in place.     HPI:  Patient able to provide significant history as she reports pain consistently.  She also reports a thirsty she is.  She does admit that yesterday she used a substance that she had stayed away from for a long time.  She admits to smoking cocaine.  She denies any IV drug use.  She is tearful as she says  her husband is dead and was beside her when she was found by EMS.  She was transported to the ED.  Lab data from her creatinine over 3, potassium undetectably high, CK of 50,000, LFTs in the thousands.  She had a CT abdomen pelvis chest which showed no dissection or aneurysm, clear lungs, bilateral renal  infarcts.  She was given IV fluids.  She was given vancomycin and cefepime.  Lactic acid was elevated.  Trended down.  PCCM was consulted for admission given multiple medical issues.  Nephrology was consulted recommended CRRT with which I agree.  She will be admitted to the ICU for emergent dialysis for hyperkalemia  Past Psychiatric History: Schizophrenia, polysubstance abuse, major depression and anxiety.  She was recently hospitalized at [facility], for which she was managed with gabapentin 300 mg p.o. 3 times daily, Risperdal 3 mg p.o. nightly, sertraline 100 mg p.o. daily, and trazodone 100 mg p.o. nightly.  She denies any previous suicide attempts, and or self-harm behaviors.  She has significant substance abuse history to include opiates, cocaine, marijuana, and benzodiazepines..  Risk to Self:  Yes, although able to contract for safety Risk to Others:  Denies Prior Inpatient Therapy:  Recent hospitalization at [facility] in October 2021 Prior Outpatient Therapy:  No current services  Past Medical History: History reviewed. No pertinent past medical history.  Family History: No family history on file. Family Psychiatric  History: Patient denies Social History:  Social History   Substance and Sexual Activity  Alcohol Use None     Social History   Substance and Sexual Activity  Drug Use Not on file    Social History   Socioeconomic History  . Marital status: Widowed    Spouse name: Not on file  . Number of children: Not on file  . Years of education: Not on file  . Highest education level: Not on file  Occupational History  . Not on file  Tobacco Use  . Smoking status: Not on file  . Smokeless tobacco: Not on file  Substance and Sexual Activity  . Alcohol use: Not on file  . Drug use: Not on file  . Sexual activity: Not on file  Other Topics Concern  . Not on file  Social History Narrative  . Not on file   Social Determinants of Health   Financial Resource  Strain: Not on file  Food Insecurity: Not on file  Transportation Needs: Not on file  Physical Activity: Not on file  Stress: Not on file  Social Connections: Not on file   Additional Social History:    Allergies:  No Known Allergies  Labs:  Results for orders placed or performed during the hospital encounter of 02/09/21 (from the past 48 hour(s))  CBC with Differential     Status: Abnormal   Collection Time: 02/09/21  8:30 AM  Result Value Ref Range   WBC 30.6 (H) 4.0 - 10.5 K/uL   RBC 5.43 (H) 3.87 - 5.11 MIL/uL   Hemoglobin 15.9 (H) 12.0 - 15.0 g/dL   HCT 49.5 (H) 36.0 - 46.0 %   MCV 91.2 80.0 - 100.0 fL   MCH 29.3 26.0 - 34.0 pg   MCHC 32.1 30.0 - 36.0 g/dL   RDW 14.0 11.5 - 15.5 %   Platelets 370 150 - 400 K/uL   nRBC 0.2 0.0 - 0.2 %   Neutrophils Relative % 68 %   Neutro Abs 21.0 (H) 1.7 - 7.7 K/uL   Lymphocytes Relative  20 %   Lymphs Abs 6.0 (H) 0.7 - 4.0 K/uL   Monocytes Relative 8 %   Monocytes Absolute 2.5 (H) 0.1 - 1.0 K/uL   Eosinophils Relative 0 %   Eosinophils Absolute 0.0 0.0 - 0.5 K/uL   Basophils Relative 1 %   Basophils Absolute 0.2 (H) 0.0 - 0.1 K/uL   Immature Granulocytes 3 %   Abs Immature Granulocytes 0.82 (H) 0.00 - 0.07 K/uL    Comment: Performed at [facility], [place] [address], [place], [place] [address]  Comprehensive metabolic panel     Status: Abnormal   Collection Time: 02/09/21  8:30 AM  Result Value Ref Range   Sodium 136 135 - 145 mmol/L   Potassium >7.5 (HH) 3.5 - 5.1 mmol/L    Comment: NO VISIBLE HEMOLYSIS CRITICAL RESULT CALLED TO, READ BACK BY AND VERIFIED WITH: MESSICK, MD AT 1043 02/09/21 MULLINS,T    Chloride 101 98 - 111 mmol/L   CO2 16 (L) 22 - 32 mmol/L   Glucose, Bld 164 (H) 70 - 99 mg/dL    Comment: Glucose reference range applies only to samples taken after fasting for at least 8 hours.   BUN 38 (H) 6 - 20 mg/dL   Creatinine, Ser 3.39 (H) 0.44 - 1.00 mg/dL   Calcium 7.5 (L) 8.9 - 10.3 mg/dL    Total Protein 9.6 (H) 6.5 - 8.1 g/dL   Albumin 4.0 3.5 - 5.0 g/dL   AST 1,411 (H) 15 - 41 U/L   ALT 467 (H) 0 - 44 U/L   Alkaline Phosphatase 199 (H) 38 - 126 U/L   Total Bilirubin 0.7 0.3 - 1.2 mg/dL   GFR, Estimated 16 (L) >60 mL/min    Comment: (NOTE) Calculated using the CKD-EPI Creatinine Equation (2021)    Anion gap 19 (H) 5 - 15    Comment: Performed at [facility], [place] [address] [place], [place] [address]  Ethanol     Status: None   Collection Time: 02/09/21  8:30 AM  Result Value Ref Range   Alcohol, Ethyl (B) <10 <10 mg/dL    Comment: (NOTE) Lowest detectable limit for serum alcohol is 10 mg/dL.  For medical purposes only. Performed at [facility], [place] [address] [place], [place] [id number]   Salicylate level     Status: Abnormal   Collection Time: 02/09/21  8:30 AM  Result Value Ref Range   Salicylate Lvl [id number] (L) 7.0 - 30.0 mg/dL    Comment: Performed at [facility], [place] [address] [place], [place] [address]  Troponin I (High Sensitivity)     Status: Abnormal   Collection Time: 02/09/21  8:30 AM  Result Value Ref Range   Troponin I (High Sensitivity) 3,709 (HH) <18 ng/L    Comment: CRITICAL RESULT CALLED TO, READ BACK BY AND VERIFIED WITH: WEST,S. RN AT 1036 02/09/21 MULLINS,T (NOTE) Elevated high sensitivity troponin I (hsTnI) values and significant  changes across serial measurements may suggest ACS but many other  chronic and acute conditions are known to elevate hsTnI results.  Refer to the Links section for chest pain algorithms and additional  guidance. Performed at [facility], [place] [address] [place], [place] [address]   CK     Status: Abnormal   Collection Time: 02/09/21  8:30 AM  Result Value Ref Range   Total CK >50,000 (H) 38 - 234 U/L    Comment: RESULTS CONFIRMED BY MANUAL DILUTION Performed  at [facility], [place] [address] [place],  [place] [address]   hCG, serum, qualitative     Status: None   Collection Time: 02/09/21  8:30 AM  Result Value Ref Range   Preg, Serum NEGATIVE NEGATIVE    Comment:        THE SENSITIVITY OF THIS METHODOLOGY IS >10 mIU/mL. Performed at [facility], [place] [address], [place], [place] [address]   Lactic acid, plasma     Status: Abnormal   Collection Time: 02/09/21  8:45 AM  Result Value Ref Range   Lactic Acid, Venous 4.4 (HH) 0.5 - 1.9 mmol/L    Comment: CRITICAL RESULT CALLED TO, READ BACK BY AND VERIFIED WITH: WEST,S. RN AT [phone] 02/09/21 MULLINS,T Performed at [facility], [place] [address] [place], [place] [address]   Magnesium     Status: Abnormal   Collection Time: 02/09/21  8:45 AM  Result Value Ref Range   Magnesium 3.1 (H) 1.7 - 2.4 mg/dL    Comment: Performed at [facility], [place] [address] [place], [place] [address]  Phosphorus     Status: Abnormal   Collection Time: 02/09/21  8:45 AM  Result Value Ref Range   Phosphorus 10.7 (H) 2.5 - 4.6 mg/dL    Comment: Performed at [facility], [place] [address] [place], [place] [address]  Troponin I (High Sensitivity)     Status: Abnormal   Collection Time: 02/09/21 11:05 AM  Result Value Ref Range   Troponin I (High Sensitivity) 4,449 (HH) <18 ng/L    Comment: DELTA CHECK NOTED CRITICAL RESULT CALLED TO, READ BACK BY AND VERIFIED WITH: BLAIR,I. RN AT [id number] 02/09/21 MULLINS,T (NOTE) Elevated high sensitivity troponin I (hsTnI) values and significant  changes across serial measurements may suggest ACS but many other  chronic and acute conditions are known to elevate hsTnI results.  Refer to the Links section for chest pain algorithms and additional  guidance. Performed at [facility], [place] [address], [place], [place] [address]   Blood gas, venous (at [facility], not at [facility])     Status: Abnormal   Collection Time: 02/09/21 11:05 AM  Result  Value Ref Range   pH, Ven 7.202 (L) 7.250 - 7.430   pCO2, Ven 38.8 (L) 44.0 - 60.0 mmHg   pO2, Ven 44.2 32.0 - 45.0 mmHg   Bicarbonate 14.7 (L) 20.0 - 28.0 mmol/L   Acid-base deficit 12.7 (H) 0.0 - 2.0 mmol/L   O2 Saturation 68.3 %   Patient temperature 98.6     Comment: Performed at [facility], [place] [address], [place], [place] [address]  Lactic acid, plasma     Status: Abnormal   Collection Time: 02/09/21 11:08 AM  Result Value Ref Range   Lactic Acid, Venous 4.0 (HH) 0.5 - 1.9 mmol/L    Comment: CRITICAL VALUE NOTED.  VALUE IS CONSISTENT WITH PREVIOUSLY REPORTED AND CALLED VALUE. Performed at [facility], [place] [address] [place],  [address]   Culture, blood (single)     Status: None (Preliminary result)   Collection Time: 02/09/21 11:09 AM   Specimen: BLOOD  Result Value Ref Range   Specimen Description      BLOOD LEFT ANTECUBITAL Performed at [facility], [place] [address] [place],  [address]    Special Requests      BOTTLES DRAWN AEROBIC AND ANAEROBIC Blood Culture adequate volume Performed at [facility], [place] [address], [place], [place] [address]    Culture      NO GROWTH < 24 HOURS Performed at [place] [address], [place], [place] [address]    Report Status PENDING   Basic metabolic panel     Status: Abnormal   Collection Time: 02/09/21 12:00 PM  Result Value Ref Range   Sodium 131 (L) 135 - 145 mmol/L   Potassium >7.5 (HH) 3.5 - 5.1 mmol/L    Comment: NO VISIBLE HEMOLYSIS CRITICAL RESULT CALLED TO, READ BACK BY AND VERIFIED WITH: T.OLEARY, RN AT 1419 ON 03.17.22 BY N.THOMPSON    Chloride 100 98 - 111 mmol/L   CO2 14 (L) 22 - 32 mmol/L   Glucose, Bld 206 (H) 70 - 99 mg/dL    Comment: Glucose reference range applies only to samples taken after fasting for at least 8 hours.   BUN 42 (H) 6 - 20 mg/dL   Creatinine, Ser 3.38 (H) 0.44 - 1.00 mg/dL   Calcium 6.9 (L) 8.9 -  10.3 mg/dL   GFR, Estimated 16 (L) >60 mL/min    Comment: (NOTE) Calculated using the CKD-EPI Creatinine Equation (2021)    Anion gap 17 (H) 5 - 15    Comment: Performed at [facility], [place] [address], [place], [place] [address]  Resp Panel by RT-PCR (Flu A&B, Covid) Nasopharyngeal Swab     Status: None   Collection Time: 02/09/21 12:08 PM   Specimen: Nasopharyngeal Swab; Nasopharyngeal(NP) swabs in vial transport medium  Result Value Ref Range   SARS Coronavirus 2 by RT PCR NEGATIVE NEGATIVE    Comment: (NOTE) SARS-CoV-2 target nucleic acids are NOT DETECTED.  The SARS-CoV-2 RNA is generally detectable in upper respiratory specimens during the acute phase of infection. The lowest concentration of SARS-CoV-2 viral copies this assay can detect is 138 copies/mL. A negative result does not preclude SARS-Cov-2 infection and should not be used as the sole basis for treatment or other patient management decisions. A negative result may occur with  improper specimen collection/handling, submission of specimen other than nasopharyngeal swab, presence of viral mutation(s) within the areas targeted by this assay, and inadequate number of viral copies(<138 copies/mL). A negative result must be combined with clinical observations, patient history, and epidemiological information. The expected result is Negative.  Fact Sheet for Patients:  EntrepreneurPulse.com.au  Fact Sheet for Healthcare Providers:  IncredibleEmployment.be  This test is no t yet approved or cleared by the [place] FDA and  has been authorized for detection and/or diagnosis of SARS-CoV-2 by FDA under an Emergency Use Authorization (EUA). This EUA will remain  in effect (meaning this test can be used) for the duration of the COVID-19 declaration under Section 564(b)(1) of the Act, 21 U.S.C.section 360bbb-3(b)(1), unless the authorization is terminated  or revoked  sooner.       Influenza A by PCR NEGATIVE NEGATIVE   Influenza B by PCR NEGATIVE NEGATIVE    Comment: (NOTE) The Xpert Xpress SARS-CoV-2/FLU/RSV plus assay is intended as an aid in the diagnosis of influenza from Nasopharyngeal swab specimens and should not be used as a sole basis for treatment. Nasal washings and aspirates are unacceptable for Xpert Xpress SARS-CoV-2/FLU/RSV testing.  Fact Sheet for Patients: EntrepreneurPulse.com.au  Fact Sheet for Healthcare Providers: IncredibleEmployment.be  This test is not yet approved or cleared by the [place] FDA and has been authorized for detection and/or diagnosis of SARS-CoV-2 by FDA under an Emergency Use Authorization (  EUA). This EUA will remain in effect (meaning this test can be used) for the duration of the COVID-19 declaration under Section 564(b)(1) of the Act, 21 U.S.C. section 360bbb-3(b)(1), unless the authorization is terminated or revoked.  Performed at [facility] [place], [place] [address], [place], [place] [address]   Urinalysis, Routine w reflex microscopic     Status: Abnormal   Collection Time: 02/09/21 12:21 PM  Result Value Ref Range   Color, Urine RED (A) YELLOW    Comment: BIOCHEMICALS MAY BE AFFECTED BY COLOR   APPearance HAZY (A) CLEAR   Specific Gravity, Urine 1.029 1.005 - 1.030   pH 6.0 5.0 - 8.0   Glucose, UA 150 (A) NEGATIVE mg/dL   Hgb urine dipstick LARGE (A) NEGATIVE   Bilirubin Urine NEGATIVE NEGATIVE   Ketones, ur 5 (A) NEGATIVE mg/dL   Protein, ur 100 (A) NEGATIVE mg/dL   Nitrite NEGATIVE NEGATIVE   Leukocytes,Ua NEGATIVE NEGATIVE   RBC / HPF >50 (H) 0 - 5 RBC/hpf   WBC, UA 0-5 0 - 5 WBC/hpf   Bacteria, UA RARE (A) NONE SEEN   Squamous Epithelial / LPF 0-5 0 - 5    Comment: Performed at [facility], [place] [address], [place], [place] [address]  Urine rapid drug screen (hosp performed)     Status: None   Collection  Time: 02/09/21 12:21 PM  Result Value Ref Range   Opiates NONE DETECTED NONE DETECTED   Cocaine NONE DETECTED NONE DETECTED   Benzodiazepines NONE DETECTED NONE DETECTED   Amphetamines NONE DETECTED NONE DETECTED   Tetrahydrocannabinol NONE DETECTED NONE DETECTED   Barbiturates NONE DETECTED NONE DETECTED    Comment: (NOTE) DRUG SCREEN FOR MEDICAL PURPOSES ONLY.  IF CONFIRMATION IS NEEDED FOR ANY PURPOSE, NOTIFY LAB WITHIN 5 DAYS.  LOWEST DETECTABLE LIMITS FOR URINE DRUG SCREEN Drug Class                     Cutoff (ng/mL) Amphetamine and metabolites    1000 Barbiturate and metabolites    200 Benzodiazepine                 [id number] Tricyclics and metabolites     300 Opiates and metabolites        300 Cocaine and metabolites        300 THC                            50 Performed at [facility], [place] [address], [place], [place] [address]   Pregnancy, urine     Status: None   Collection Time: 02/09/21 12:21 PM  Result Value Ref Range   Preg Test, Ur NEGATIVE NEGATIVE    Comment:        THE SENSITIVITY OF THIS METHODOLOGY IS >20 mIU/mL. Performed at [facility], [place] [address] [place], [place] [address]   MRSA PCR Screening     Status: Abnormal   Collection Time: 02/09/21  1:36 PM   Specimen: Nasopharyngeal  Result Value Ref Range   MRSA by PCR POSITIVE (A) NEGATIVE    Comment:        The GeneXpert MRSA Assay (FDA approved for NASAL specimens only), is one component of a comprehensive MRSA colonization surveillance program. It is not intended to diagnose MRSA infection nor to guide or monitor treatment for MRSA infections. RESULT CALLED TO, READ BACK BY AND VERIFIED WITH:  Elodia [place], RN AT [id number] ON 03.17.22 BY N.THOMPSON Performed at [place] [address], [place], [place] [address]   Glucose, capillary     Status: Abnormal   Collection Time: 02/09/21  3:22 PM  Result Value Ref Range   Glucose-Capillary  158 (H) 70 - 99 mg/dL    Comment: Glucose reference range applies only to samples taken after fasting for at least 8 hours.   Comment 1 Notify RN    Comment 2 Document in Chart   Renal function panel (daily at 1600)     Status: Abnormal   Collection Time: 02/09/21  3:35 PM  Result Value Ref Range   Sodium 131 (L) 135 - 145 mmol/L   Potassium 5.4 (H) 3.5 - 5.1 mmol/L    Comment: DELTA CHECK NOTED REPEATED TO VERIFY    Chloride 101 98 - 111 mmol/L   CO2 17 (L) 22 - 32 mmol/L   Glucose, Bld 168 (H) 70 - 99 mg/dL    Comment: Glucose reference range applies only to samples taken after fasting for at least 8 hours.   BUN 40 (H) 6 - 20 mg/dL   Creatinine, Ser 2.67 (H) 0.44 - 1.00 mg/dL   Calcium 6.4 (LL) 8.9 - 10.3 mg/dL    Comment: CRITICAL RESULT CALLED TO, READ BACK BY AND VERIFIED WITH: T.OLEARY, RN AT 1653 ON 03.17.22 BY N.THOMPSON    Phosphorus 7.2 (H) 2.5 - 4.6 mg/dL   Albumin 3.0 (L) 3.5 - 5.0 g/dL   GFR, Estimated 21 (L) >60 mL/min    Comment: (NOTE) Calculated using the CKD-EPI Creatinine Equation (2021)    Anion gap 13 5 - 15    Comment: Performed at [facility], [place] [address] [place], [place] [address]  HIV Antibody (routine testing w rflx)     Status: None   Collection Time: 02/09/21  3:35 PM  Result Value Ref Range   HIV Screen 4th Generation wRfx Non Reactive Non Reactive    Comment: Performed at [facility], [place] [address], [place], [place] [address]  Troponin I (High Sensitivity)     Status: Abnormal   Collection Time: 02/09/21  3:35 PM  Result Value Ref Range   Troponin I (High Sensitivity) 2,787 (HH) <18 ng/L    Comment: DELTA CHECK NOTED CRITICAL VALUE NOTED.  VALUE IS CONSISTENT WITH PREVIOUSLY REPORTED AND CALLED VALUE. (NOTE) Elevated high sensitivity troponin I (hsTnI) values and significant  changes across serial measurements may suggest ACS but many other  chronic and acute conditions are known to elevate hsTnI results.   Refer to the Links section for chest pain algorithms and additional  guidance. Performed at [facility], [place] [address] [place],  [id number]   Basic metabolic panel     Status: Abnormal   Collection Time: 02/09/21  9:03 PM  Result Value Ref Range   Sodium 130 (L) 135 - 145 mmol/L   Potassium 5.1 3.5 - 5.1 mmol/L   Chloride 94 (L) 98 - 111 mmol/L   CO2 19 (L) 22 - 32 mmol/L   Glucose, Bld 149 (H) 70 - 99 mg/dL    Comment: Glucose reference range applies only to samples taken after fasting for at least 8 hours.   BUN 37 (H) 6 - 20 mg/dL   Creatinine, Ser 2.68 (H) 0.44 - 1.00 mg/dL   Calcium 6.8 (L) 8.9 - 10.3 mg/dL   GFR, Estimated 21 (L) >60 mL/min    Comment: (  NOTE) Calculated using the CKD-EPI Creatinine Equation (2021)    Anion gap 17 (H) 5 - 15    Comment: Performed at [facility], [place] [address], [place], [place] [address]  Magnesium     Status: None   Collection Time: 02/10/21  5:06 AM  Result Value Ref Range   Magnesium 2.3 1.7 - 2.4 mg/dL    Comment: Performed at [facility], [place] [address] [place], [place] [address]  CBC     Status: Abnormal   Collection Time: 02/10/21  5:06 AM  Result Value Ref Range   WBC 21.8 (H) 4.0 - 10.5 K/uL   RBC 4.37 3.87 - 5.11 MIL/uL   Hemoglobin 12.6 12.0 - 15.0 g/dL   HCT 38.1 36.0 - 46.0 %   MCV 87.2 80.0 - 100.0 fL   MCH 28.8 26.0 - 34.0 pg   MCHC 33.1 30.0 - 36.0 g/dL   RDW 14.0 11.5 - 15.5 %   Platelets 201 150 - 400 K/uL   nRBC 0.1 0.0 - 0.2 %    Comment: Performed at [facility], [place] [address] [place], [place] [address]  Comprehensive metabolic panel     Status: Abnormal   Collection Time: 02/10/21  5:06 AM  Result Value Ref Range   Sodium 129 (L) 135 - 145 mmol/L   Potassium 5.2 (H) 3.5 - 5.1 mmol/L   Chloride 91 (L) 98 - 111 mmol/L   CO2 25 22 - 32 mmol/L   Glucose, Bld 166 (H) 70 - 99 mg/dL    Comment: Glucose reference range  applies only to samples taken after fasting for at least 8 hours.   BUN 36 (H) 6 - 20 mg/dL   Creatinine, Ser 2.54 (H) 0.44 - 1.00 mg/dL   Calcium 6.7 (L) 8.9 - 10.3 mg/dL   Total Protein 7.0 6.5 - 8.1 g/dL   Albumin 2.8 (L) 3.5 - 5.0 g/dL   AST 1,303 (H) 15 - 41 U/L   ALT 524 (H) 0 - 44 U/L   Alkaline Phosphatase 124 38 - 126 U/L   Total Bilirubin 0.9 0.3 - 1.2 mg/dL   GFR, Estimated 22 (L) >60 mL/min    Comment: (NOTE) Calculated using the CKD-EPI Creatinine Equation (2021)    Anion gap 13 5 - 15    Comment: Performed at [facility] [facility] [place] [address], [place], [place] [address]  Renal function panel (daily at 0500)     Status: Abnormal   Collection Time: 02/10/21  5:06 AM  Result Value Ref Range   Sodium 130 (L) 135 - 145 mmol/L   Potassium 5.2 (H) 3.5 - 5.1 mmol/L   Chloride 91 (L) 98 - 111 mmol/L   CO2 26 22 - 32 mmol/L   Glucose, Bld 166 (H) 70 - 99 mg/dL    Comment: Glucose reference range applies only to samples taken after fasting for at least 8 hours.   BUN 34 (H) 6 - 20 mg/dL   Creatinine, Ser 2.58 (H) 0.44 - 1.00 mg/dL   Calcium 6.8 (L) 8.9 - 10.3 mg/dL   Phosphorus 5.8 (H) 2.5 - 4.6 mg/dL   Albumin 2.9 (L) 3.5 - 5.0 g/dL   GFR, Estimated 22 (L) >60 mL/min    Comment: (NOTE) Calculated using the CKD-EPI Creatinine Equation (2021)    Anion gap 13 5 - 15    Comment: Performed at [facility], [facility] [address], [place], [place] [address]  Current Facility-Administered Medications  Medication Dose Route Frequency Provider Last Rate Last Admin  . Chlorhexidine Gluconate Cloth 2 % PADS 6 each  6 each Topical Q0600 Hunsucker, Bonna Gains, MD      . dexmedetomidine (PRECEDEX) 200 MCG/50ML (4 mcg/mL) infusion  0.2-1.2 mcg/kg/hr Intravenous Titrated Frederik Pear, MD 10.43 mL/hr at 02/10/21 1024 0.4 mcg/kg/hr at 02/10/21 1024  . docusate sodium (COLACE) capsule 100 mg  100 mg Oral BID PRN Hunsucker, Bonna Gains, MD      . heparin  injection 1,000-6,000 Units  1,000-6,000 Units CRRT PRN Edrick Oh, MD      . heparin injection 5,000 Units  5,000 Units Subcutaneous Q8H Hunsucker, Bonna Gains, MD   5,000 Units at 02/10/21 0500  . HYDROmorphone (DILAUDID) injection 0.5 mg  0.5 mg Intravenous Q2H PRN Frederik Pear, MD   0.5 mg at 02/10/21 0733  . lactated ringers infusion   Intravenous Continuous Juanito Doom, MD 125 mL/hr at 02/10/21 [id number] Restarted at 02/10/21 0843  . MEDLINE mouth rinse  15 mL Mouth Rinse BID Hunsucker, Bonna Gains, MD   15 mL at 02/10/21 0937  . mupirocin ointment (BACTROBAN) 2 % 1 application  1 application Nasal BID Hunsucker, Bonna Gains, MD   1 application at [id number] [phone]  . ondansetron (ZOFRAN) injection 4 mg  4 mg Intravenous Q6H PRN Hunsucker, Bonna Gains, MD   4 mg at 02/09/21 2120  . oxyCODONE (Oxy IR/ROXICODONE) immediate release tablet 5 mg  5 mg Oral Q6H PRN Hunsucker, Bonna Gains, MD   5 mg at 02/10/21 0954  . polyethylene glycol (MIRALAX / GLYCOLAX) packet 17 g  17 g Oral Daily PRN Hunsucker, Bonna Gains, MD      . prismasol BGK 4/2.5 infusion   CRRT Continuous Edrick Oh, MD 2,000 mL/hr at 02/10/21 0841 New Bag at 02/10/21 0841  . sodium bicarbonate 150 mEq in sterile water 1,000 mL infusion   CRRT Continuous Juanito Doom, MD 100 mL/hr at 02/10/21 0745 New Bag at 02/10/21 0745  . sodium bicarbonate 150 mEq in sterile water 1,000 mL infusion   CRRT Continuous Juanito Doom, MD 100 mL/hr at 02/10/21 0818 New Bag at 02/10/21 0818  . sodium chloride 0.9 % primer fluid for CRRT   CRRT PRN Hunsucker, Bonna Gains, MD      . sodium chloride 0.9 % primer fluid for CRRT   CRRT PRN Edrick Oh, MD      . sodium chloride flush (NS) 0.9 % injection 10-40 mL  10-40 mL Intracatheter Q12H Hunsucker, Bonna Gains, MD   10 mL at 02/10/21 0937  . sodium chloride flush (NS) 0.9 % injection 10-40 mL  10-40 mL Intracatheter PRN Hunsucker, Bonna Gains, MD        Musculoskeletal: Strength & Muscle Tone: within  normal limits Gait & Station: normal Patient leans: N/A            Psychiatric Specialty Exam:  Presentation  General Appearance: Appropriate for Environment; Casual  Eye Contact:Fair  Speech:Clear and Coherent; Normal Rate  Speech Volume:Normal  Handedness:Right   Mood and Affect  Mood:Anxious; Depressed  Affect:Tearful; Depressed; Labile   Thought Process  Thought Processes:Coherent; Linear  Descriptions of Associations:Intact  Orientation:Full (Time, Place and Person)  Thought Content:Tangential  History of Schizophrenia/Schizoaffective disorder:No data recorded Duration of Psychotic Symptoms:No data recorded Hallucinations:Hallucinations: None  Ideas of Reference:None  Suicidal Thoughts:Suicidal Thoughts: Yes, Passive SI Passive Intent and/or Plan: Without Intent; Without Means to [employer]; Without Access  to Means  Homicidal Thoughts:Homicidal Thoughts: No   Sensorium  Memory:Immediate Poor; Immediate Fair; Recent Poor; Remote Fair  Judgment:Fair  Insight:Shallow   Executive Functions  Concentration:Good  Attention Span:Good  [place]  Language:Good   Psychomotor Activity  Psychomotor Activity:Psychomotor Activity: Normal   Assets  Assets:Desire for Improvement; Armed forces logistics/support/administrative officer; Financial Resources/Insurance; Housing; Social Support; Talents/Skills   Sleep  Sleep:Sleep: Poor   Physical Exam: Physical Exam ROS Blood pressure (!) 87/60, pulse 74, temperature 98.3 F (36.8 C), temperature source Oral, resp. rate 19, height '5\' 5"'$  (1.651 m), weight 108.1 kg, SpO2 96 %. Body mass index is 39.66 kg/m.  Treatment Plan Summary: Plan Patient to resolve previous regimen of medications.  Patient to benefit from establish outpatient psychiatric services.  Recommend working closely with social work to facilitate outpatient referrals to include partial hospitalization program, as patient will benefit  from behavior therapy groups, medication management, intensive and effective daily treatment sessions.  Patient does not appear to be an imminent risk to self at this time, although she endorses suicidal thoughts she is able to offer multiple protective factors to include children, grandchildren, religious police, and sense of responsibility.  -Resume Home medications when she is medically stable.  Patient has psychiatric history however is not presenting with any imminent psychiatric symptoms to include psychosis, paranoia, delusional thought disorder that would warrant immediate resumption of her home medications. Recommend working closely with social work to initiate follow-up for outpatient resources to include referral to partial hospitalization program to [facility] for substance abuse treatment.  -Patient does not appear to be an imminent risk to self or others at this time. -As previously noted patient with renal failure, oongoing CRRT, and sedation (Precedex)  during this evaluation, all of which can impact her judgment and insight.  In the event she provides any additional information please feel free to reconsult psychiatry for additional evaluation. -Consult chaplain for grief services  Disposition: No evidence of imminent risk to self or others at present.   Patient does not meet criteria for psychiatric inpatient admission. Supportive therapy provided about ongoing stressors. Refer to IOP. Discussed crisis plan, support from social network, calling 911, coming to the Emergency Department, and calling Suicide Hotline.  Suella Broad, FNP 02/10/2021 11:25 AM

## 2021-02-10 NOTE — TOC Initial Note (Signed)
Transition of Care [facility]) - Initial/Assessment Note    Patient Details  Name: Renee Wilkinson MRN: [id number] Date of Birth: 09-11-1971  Transition of Care [facility]) CM/SW Contact:    Leeroy Cha, RN Phone Number: 02/10/2021, 7:52 AM  Clinical Narrative:                 50 year old admits to cocaine use who was found down beside that husband admitted with hyperkalemia, renal failure, shock liver with chief complaint of abdominal pain with imaging consistent with renal infarcts.  History of Present Illness:  Patient able to provide significant history as she reports pain consistently.  She also reports a thirsty she is.  She does admit that yesterday she used a substance that she had stayed away from for a long time.  She admits to smoking cocaine.  She denies any IV drug use.  She is tearful as she says her husband is dead and was beside her when she was found by EMS.  She was transported to the ED.  Lab data from her creatinine over 3, potassium undetectably high, CK of 50,000, LFTs in the thousands.  She had a CT abdomen pelvis chest which showed no dissection or aneurysm, clear lungs, bilateral renal infarcts.  She was given IV fluids.  She was given vancomycin and cefepime.  Lactic acid was elevated.  Trended down.  PCCM was consulted for admission given multiple medical issues.  Nephrology was consulted recommended CRRT with which I agree.  She will be admitted to the ICU for emergent dialysis for hyperkalemia. PLAN: pt is homeless will follow to see how her renal and liver studies progress.   Expected Discharge Plan: Home/Self Care Barriers to Discharge: Continued Medical Work up   Patient Goals and CMS Choice Patient states their goals for this hospitalization and ongoing recovery are:: to go home CMS Medicare.gov Compare Post Acute Care list provided to:: Patient Choice offered to / list presented to : Patient  Expected Discharge Plan and Services Expected Discharge Plan:  Home/Self Care   Discharge Planning Services: CM Consult   Living arrangements for the past 2 months: No permanent address,Homeless Shelter                                      Prior Living Arrangements/Services Living arrangements for the past 2 months: No permanent address,Homeless Shelter Lives with:: Self Patient language and need for interpreter reviewed:: Yes Do you feel safe going back to the place where you live?: Yes      Need for Family Participation in Patient Care: No (Comment) Care giver support system in place?: No (comment)   Criminal Activity/Legal Involvement Pertinent to Current Situation/Hospitalization: No - Comment as needed  Activities of Daily Living Home Assistive Devices/Equipment: None ADL Screening (condition at time of admission) Patient's cognitive ability adequate to safely complete daily activities?: No (patient out of sorts and in a lot of pain) Is the patient deaf or have difficulty hearing?: No Does the patient have difficulty seeing, even when wearing glasses/contacts?: No Does the patient have difficulty concentrating, remembering, or making decisions?: Yes Patient able to express need for assistance with ADLs?: Yes Does the patient have difficulty dressing or bathing?: Yes Independently performs ADLs?: No Communication: Independent Dressing (OT): Needs assistance Is this a change from baseline?: Change from baseline, expected to last >3 days Grooming: Independent Feeding: Independent Bathing: Needs assistance Is this a  change from baseline?: Change from baseline, expected to last >3 days Toileting: Needs assistance Is this a change from baseline?: Change from baseline, expected to last >3days In/Out Bed: Needs assistance Is this a change from baseline?: Change from baseline, expected to last >3 days Walks in Home: Needs assistance Is this a change from baseline?: Change from baseline, expected to last >3 days Does the patient have  difficulty walking or climbing stairs?: Yes (secondary to generalized pain) Weakness of Legs: Both Weakness of Arms/Hands: None  Permission Sought/Granted                  Emotional Assessment Appearance:: Appears older than stated age Attitude/Demeanor/Rapport: Engaged Affect (typically observed): Calm Orientation: : Oriented to Place,Oriented to Self,Oriented to  Time,Oriented to Situation Alcohol / Substance Use: Illicit Drugs,Alcohol Use,Tobacco Use Psych Involvement: No (comment)  Admission diagnosis:  Renal failure [N19] Non-traumatic rhabdomyolysis [M62.82] Patient Active Problem List   Diagnosis Date Noted  . Renal failure 02/09/2021  . Encounter for central line placement    PCP:  Patient, No Pcp Per Pharmacy:   [id number] [address] [id number] [id number]: [phone]: [phone]    Social Determinants of Health (SDOH) Interventions    Readmission Risk Interventions No flowsheet data found.

## 2021-02-10 NOTE — Progress Notes (Signed)
Spiritual care responding to referral from RN.  Pt awoke down next to husband who was deceased.    Provided support at bedside around shock, grief.  Renee Wilkinson presented as tearful.  Noted that she has not told her daughter, Renee Wilkinson (74), or other two children.  Renee Wilkinson lives in [place] and is in route to hospital.  Other two children live in [place] and [place].    Spoke with Renee Wilkinson about support for her and daughter.  Kodie wishes to tell daughter herself - stating "I think I need to be honest with her about what was going on"  Wishes chaplains to be present for support outside room.    Chaplain team available for support with pt and family when they arrive.   [name] also noted that spouse's family does not yet know.  Describes some history of tension in relationship with his family.  She did not reveal specific information about this, but Chaplain explained to [place] that we could set up restrictions on phone contact and have a safety plan in place around who can contact her.       Jerene Pitch, MDiv, [facility]

## 2021-02-10 NOTE — Progress Notes (Signed)
NAME:  Renee Wilkinson, MRN:  [id number], DOB:  [birth year], LOS: 1 ADMISSION DATE:  02/09/2021, CONSULTATION DATE:  3/17 REFERRING MD:  EDP, CHIEF COMPLAINT:  Abdominal pain   Brief History:  50 year old admits to cocaine use who was found down beside that husband admitted with hyperkalemia, renal failure, shock liver with chief complaint of abdominal pain with imaging consistent with renal infarcts. Admitted to ICU, intubated, CRRT.  Pertinent  Medical History  Psychosis with behavioral health admit in 2021  Significant Hospital Events: Including procedures, antibiotic start and stop dates in addition to other pertinent events   . Admitted to ICU 3/17, VDRF, started CRRT . 3/17 ETT >3/17 . 3/17 R IJ HD cath  Interim History / Subjective:  Afebrile Remains on CRRT, VDRF Severe agitation overnight, started precedex Per patient she has schizophrenia, depression and anxiety.  Found down after unintentional overdose.  When she woke up her husband was dead on the floor beside her.  Now notes suicidal thoughts. Complains of abdominal pain, RUQ and left lower extremity pain/inability to move left leg Remains oliguric  Objective   Blood pressure (!) 83/52, pulse 80, temperature 98 F (36.7 C), temperature source Axillary, resp. rate 17, height '5\' 5"'$  (1.651 m), weight 108.1 kg, SpO2 96 %.        Intake/Output Summary (Last 24 hours) at 02/10/2021 0713 Last data filed at 02/10/2021 0700 Gross per 24 hour  Intake 3362.61 ml  Output 3247 ml  Net 115.61 ml   Filed Weights   02/09/21 0755 02/10/21 0500  Weight: 104.3 kg 108.1 kg    Examination: General:  Resting comfortably in bed HENT: NCAT OP clear PULM: CTA B, normal effort CV: RRR, no mgr GI: BS+, soft, mildly tender epigastrum and RUQ MSK: normal bulk and tone Neuro: awake, alert, MAEW Psyche: awake, tearful, notes suicidal thoughts   Labs/imaging personally reviewed   3/17 SARS COV 2/Flu > neg 3/17 blood cx >    Resolved Hospital Problem list   Acute respiratory failure with hypoxemia due to inability to protect airway  Assessment & Plan:  Acute metabolic encephalopathy due to drug overdose> resolved Need for sedation for mechanical ventilation > resolved Monitor neuro status  Minimize sedation  AKI, rhabdomyolysis left leg/hip Renal infarct Start IV fluids, monitor UOP Monitor left leg closely for evidence of compartment syndrome (no evidence of that on 3/18) CRRT per renal PT consult  Shock liver, new RUQ pain 3/18 APAP negative on admission Monitor LFT Check RUQ ultrasound this morning  Demand ischemia Tele Monitor hemodynamics  Suicidal thoughts Psyche consult  Best practice (evaluated daily)  Diet:  Oral Pain/Anxiety/Delirium protocol (if indicated): No VAP protocol (if indicated): Not indicated DVT prophylaxis: Subcutaneous Heparin GI prophylaxis: N/A Glucose control:  SSI No Central venous access:  Yes, and it is still needed Arterial line:  N/A Foley:  Yes, and it is still needed Mobility:  OOB  PT consulted: Yes Last date of multidisciplinary goals of care discussion [3/17] I attempted to call the patient's daughter on 3/18, had to leave a message Code Status:  full code Disposition: remain in ICU  Labs   CBC: Recent Labs  Lab 02/09/21 0830 02/10/21 0506  WBC 30.6* 21.8*  NEUTROABS 21.0*  --   HGB 15.9* 12.6  HCT 49.5* 38.1  MCV 91.2 87.2  PLT 370 [id number]    Basic Metabolic Panel: Recent Labs  Lab 02/09/21 0830 02/09/21 0845 02/09/21 1200 02/09/21 1535 02/09/21 2103 02/10/21 0506  NA 136  --  131* 131* 130* 129*  130*  K >7.5*  --  >7.5* 5.4* 5.1 5.2*  5.2*  CL 101  --  100 101 94* 91*  91*  CO2 16*  --  14* 17* 19* 25  26  GLUCOSE 164*  --  206* 168* 149* 166*  166*  BUN 38*  --  42* 40* 37* 36*  34*  CREATININE 3.39*  --  3.38* 2.67* 2.68* 2.54*  2.58*  CALCIUM 7.5*  --  6.9* 6.4* 6.8* 6.7*  6.8*  MG  --  3.1*  --   --   --  2.3   PHOS  --  10.7*  --  7.2*  --  5.8*   GFR: Estimated Creatinine Clearance: 32.4 mL/min (A) (by C-G formula based on SCr of 2.54 mg/dL (H)). Recent Labs  Lab 02/09/21 0830 02/09/21 0845 02/09/21 1108 02/10/21 0506  WBC 30.6*  --   --  21.8*  LATICACIDVEN  --  4.4* 4.0*  --     Liver Function Tests: Recent Labs  Lab 02/09/21 0830 02/09/21 1535 02/10/21 0506  AST 1,411*  --  1,303*  ALT 467*  --  524*  ALKPHOS 199*  --  124  BILITOT 0.7  --  0.9  PROT 9.6*  --  7.0  ALBUMIN 4.0 3.0* 2.8*  2.9*   No results for input(s): LIPASE, AMYLASE in the last 168 hours. No results for input(s): AMMONIA in the last 168 hours.  ABG    Component Value Date/Time   HCO3 14.7 (L) 02/09/2021 1105   ACIDBASEDEF 12.7 (H) 02/09/2021 1105   O2SAT 68.3 02/09/2021 1105     Coagulation Profile: No results for input(s): INR, PROTIME in the last 168 hours.  Cardiac Enzymes: Recent Labs  Lab 02/09/21 0830  CKTOTAL >50,000*    HbA1C: No results found for: HGBA1C  CBG: Recent Labs  Lab 02/09/21 1522  GLUCAP 158*     Critical care time: 30 minutes     [name], MD [place] [facility] Pager: [phone] Cell: [phone] If no response, please call [phone] until 7pm After 7:00 pm call Elink  [phone]

## 2021-02-10 NOTE — Consult Note (Signed)
Reason for Consult:Rule out compartment syndrome  Referring Physician: Dr. [name] is an 50 y.o. female.  HPI: The patient is a 50 year old female who presented to the hospital after drug overdose.  She presented in kidney failure and with rhabdomyolysis.  Today she was noted to be complaining of numbness in the left lower extremity and was felt to have coolness in the left foot.  I was called to rule out compartment syndrome.  The patient's reported pain scores have been 9 or 10 whenever she is awake.Her current blood pressure is 89/55 and her pulse is 84.  She does not complain of pain in the left lower extremity but does note numbness in the foot and ankle.  History reviewed. No pertinent past medical history.    No family history on file.  Social History:  has no history on file for tobacco use, alcohol use, and drug use.  Allergies: No Known Allergies  Medications: I have reviewed the patient's current medications.  Results for orders placed or performed during the hospital encounter of 02/09/21 (from the past 48 hour(s))  CBC with Differential     Status: Abnormal   Collection Time: 02/09/21  8:30 AM  Result Value Ref Range   WBC 30.6 (H) 4.0 - 10.5 K/uL   RBC 5.43 (H) 3.87 - 5.11 MIL/uL   Hemoglobin 15.9 (H) 12.0 - 15.0 g/dL   HCT 49.5 (H) 36.0 - 46.0 %   MCV 91.2 80.0 - 100.0 fL   MCH 29.3 26.0 - 34.0 pg   MCHC 32.1 30.0 - 36.0 g/dL   RDW 14.0 11.5 - 15.5 %   Platelets 370 150 - 400 K/uL   nRBC 0.2 0.0 - 0.2 %   Neutrophils Relative % 68 %   Neutro Abs 21.0 (H) 1.7 - 7.7 K/uL   Lymphocytes Relative 20 %   Lymphs Abs 6.0 (H) 0.7 - 4.0 K/uL   Monocytes Relative 8 %   Monocytes Absolute 2.5 (H) 0.1 - 1.0 K/uL   Eosinophils Relative 0 %   Eosinophils Absolute 0.0 0.0 - 0.5 K/uL   Basophils Relative 1 %   Basophils Absolute 0.2 (H) 0.0 - 0.1 K/uL   Immature Granulocytes 3 %   Abs Immature Granulocytes 0.82 (H) 0.00 - 0.07 K/uL    Comment: Performed at  [facility], [place] [address], [place], [place] [address]  Comprehensive metabolic panel     Status: Abnormal   Collection Time: 02/09/21  8:30 AM  Result Value Ref Range   Sodium 136 135 - 145 mmol/L   Potassium >7.5 (HH) 3.5 - 5.1 mmol/L    Comment: NO VISIBLE HEMOLYSIS CRITICAL RESULT CALLED TO, READ BACK BY AND VERIFIED WITH: MESSICK, MD AT 1043 02/09/21 MULLINS,T    Chloride 101 98 - 111 mmol/L   CO2 16 (L) 22 - 32 mmol/L   Glucose, Bld 164 (H) 70 - 99 mg/dL    Comment: Glucose reference range applies only to samples taken after fasting for at least 8 hours.   BUN 38 (H) 6 - 20 mg/dL   Creatinine, Ser 3.39 (H) 0.44 - 1.00 mg/dL   Calcium 7.5 (L) 8.9 - 10.3 mg/dL   Total Protein 9.6 (H) 6.5 - 8.1 g/dL   Albumin 4.0 3.5 - 5.0 g/dL   AST 1,411 (H) 15 - 41 U/L   ALT 467 (H) 0 - 44 U/L   Alkaline Phosphatase 199 (H) 38 - 126 U/L   Total Bilirubin 0.7  0.3 - 1.2 mg/dL   GFR, Estimated 16 (L) >60 mL/min    Comment: (NOTE) Calculated using the CKD-EPI Creatinine Equation (2021)    Anion gap 19 (H) 5 - 15    Comment: Performed at [facility], [place] [address] [place], [place] [address]  Ethanol     Status: None   Collection Time: 02/09/21  8:30 AM  Result Value Ref Range   Alcohol, Ethyl (B) <10 <10 mg/dL    Comment: (NOTE) Lowest detectable limit for serum alcohol is 10 mg/dL.  For medical purposes only. Performed at [facility], [place] [address], [place], [place] [id number]   Salicylate level     Status: Abnormal   Collection Time: 02/09/21  8:30 AM  Result Value Ref Range   Salicylate Lvl [id number] (L) 7.0 - 30.0 mg/dL    Comment: Performed at [facility], [place] [address], [place], [place] [address]  Troponin I (High Sensitivity)     Status: Abnormal   Collection Time: 02/09/21  8:30 AM  Result Value Ref Range   Troponin I (High Sensitivity) 3,709 (HH) <18 ng/L    Comment: CRITICAL RESULT CALLED TO,  READ BACK BY AND VERIFIED WITH: WEST,S. RN AT 1036 02/09/21 MULLINS,T (NOTE) Elevated high sensitivity troponin I (hsTnI) values and significant  changes across serial measurements may suggest ACS but many other  chronic and acute conditions are known to elevate hsTnI results.  Refer to the Links section for chest pain algorithms and additional  guidance. Performed at [facility], [place] [address], [place], [place] [address]   CK     Status: Abnormal   Collection Time: 02/09/21  8:30 AM  Result Value Ref Range   Total CK >50,000 (H) 38 - 234 U/L    Comment: RESULTS CONFIRMED BY MANUAL DILUTION Performed at [facility], [place] [address], [place], [place] [address]   hCG, serum, qualitative     Status: None   Collection Time: 02/09/21  8:30 AM  Result Value Ref Range   Preg, Serum NEGATIVE NEGATIVE    Comment:        THE SENSITIVITY OF THIS METHODOLOGY IS >10 mIU/mL. Performed at [facility], [place] [address] [place], [place] [address]   Lactic acid, plasma     Status: Abnormal   Collection Time: 02/09/21  8:45 AM  Result Value Ref Range   Lactic Acid, Venous 4.4 (HH) 0.5 - 1.9 mmol/L    Comment: CRITICAL RESULT CALLED TO, READ BACK BY AND VERIFIED WITH: WEST,S. RN AT [phone] 02/09/21 MULLINS,T Performed at [facility], [place] [address] [place], [place] [address]   Magnesium     Status: Abnormal   Collection Time: 02/09/21  8:45 AM  Result Value Ref Range   Magnesium 3.1 (H) 1.7 - 2.4 mg/dL    Comment: Performed at [facility], [place] [address] [place], [place] [address]  Phosphorus     Status: Abnormal   Collection Time: 02/09/21  8:45 AM  Result Value Ref Range   Phosphorus 10.7 (H) 2.5 - 4.6 mg/dL    Comment: Performed at [facility], [place] [address], [place], [place] [address]  Troponin I (High Sensitivity)     Status: Abnormal   Collection Time: 02/09/21 11:05  AM  Result Value Ref Range   Troponin I (High Sensitivity) 4,449 (HH) <18 ng/L    Comment: DELTA CHECK NOTED CRITICAL RESULT CALLED TO,  READ BACK BY AND VERIFIED WITH: BLAIR,I. RN AT 1154 02/09/21 MULLINS,T (NOTE) Elevated high sensitivity troponin I (hsTnI) values and significant  changes across serial measurements may suggest ACS but many other  chronic and acute conditions are known to elevate hsTnI results.  Refer to the Links section for chest pain algorithms and additional  guidance. Performed at [facility], [place] [address], [place], [place] [address]   Blood gas, venous (at [facility] and [facility], not at [facility])     Status: Abnormal   Collection Time: 02/09/21 11:05 AM  Result Value Ref Range   pH, Ven 7.202 (L) 7.250 - 7.430   pCO2, Ven 38.8 (L) 44.0 - 60.0 mmHg   pO2, Ven 44.2 32.0 - 45.0 mmHg   Bicarbonate 14.7 (L) 20.0 - 28.0 mmol/L   Acid-base deficit 12.7 (H) 0.0 - 2.0 mmol/L   O2 Saturation 68.3 %   Patient temperature 98.6     Comment: Performed at [facility], [place] [address] [place], [place] [address]  Lactic acid, plasma     Status: Abnormal   Collection Time: 02/09/21 11:08 AM  Result Value Ref Range   Lactic Acid, Venous 4.0 (HH) 0.5 - 1.9 mmol/L    Comment: CRITICAL VALUE NOTED.  VALUE IS CONSISTENT WITH PREVIOUSLY REPORTED AND CALLED VALUE. Performed at [facility], [place] [address], [place], [place] [address]   Culture, blood (single)     Status: None (Preliminary result)   Collection Time: 02/09/21 11:09 AM   Specimen: BLOOD  Result Value Ref Range   Specimen Description      BLOOD LEFT ANTECUBITAL Performed at [facility], [place] [address], [place], [place] [address]    Special Requests      BOTTLES DRAWN AEROBIC AND ANAEROBIC Blood Culture adequate volume Performed at [place] [address] [place], [place] [address]    Culture      NO GROWTH < 24 HOURS Performed at [place] [address], [place], [place] [address]    Report Status PENDING   Basic metabolic panel     Status: Abnormal   Collection Time: 02/09/21 12:00 PM  Result Value Ref Range   Sodium 131 (L) 135 - 145 mmol/L   Potassium >7.5 (HH) 3.5 - 5.1 mmol/L    Comment: NO VISIBLE HEMOLYSIS CRITICAL RESULT CALLED TO, READ BACK BY AND VERIFIED WITH: T.OLEARY, RN AT 1419 ON 03.17.22 BY N.THOMPSON    Chloride 100 98 - 111 mmol/L   CO2 14 (L) 22 - 32 mmol/L   Glucose, Bld 206 (H) 70 - 99 mg/dL    Comment: Glucose reference range applies only to samples taken after fasting for at least 8 hours.   BUN 42 (H) 6 - 20 mg/dL   Creatinine, Ser 3.38 (H) 0.44 - 1.00 mg/dL   Calcium 6.9 (L) 8.9 - 10.3 mg/dL   GFR, Estimated 16 (L) >60 mL/min    Comment: (NOTE) Calculated using the CKD-EPI Creatinine Equation (2021)    Anion gap 17 (H) 5 - 15    Comment: Performed at [facility], [place] [address] [place], [place] [address]  Resp Panel by RT-PCR (Flu A&B, Covid) Nasopharyngeal Swab     Status: None   Collection Time: 02/09/21 12:08 PM   Specimen: Nasopharyngeal Swab; Nasopharyngeal(NP) swabs in vial transport medium  Result Value Ref Range   SARS Coronavirus 2 by RT PCR NEGATIVE NEGATIVE  Comment: (NOTE) SARS-CoV-2 target nucleic acids are NOT DETECTED.  The SARS-CoV-2 RNA is generally detectable in upper respiratory specimens during the acute phase of infection. The lowest concentration of SARS-CoV-2 viral copies this assay can detect is 138 copies/mL. A negative result does not preclude SARS-Cov-2 infection and should not be used as the sole basis for treatment or other patient management decisions. A negative result may occur with  improper specimen collection/handling, submission of specimen other than nasopharyngeal swab, presence of viral mutation(s) within the areas targeted by this assay, and inadequate number of viral copies(<138 copies/mL). A negative result  must be combined with clinical observations, patient history, and epidemiological information. The expected result is Negative.  Fact Sheet for Patients:  EntrepreneurPulse.com.au  Fact Sheet for Healthcare Providers:  IncredibleEmployment.be  This test is no t yet approved or cleared by the [place] FDA and  has been authorized for detection and/or diagnosis of SARS-CoV-2 by FDA under an Emergency Use Authorization (EUA). This EUA will remain  in effect (meaning this test can be used) for the duration of the COVID-19 declaration under Section 564(b)(1) of the Act, 21 U.S.C.section 360bbb-3(b)(1), unless the authorization is terminated  or revoked sooner.       Influenza A by PCR NEGATIVE NEGATIVE   Influenza B by PCR NEGATIVE NEGATIVE    Comment: (NOTE) The Xpert Xpress SARS-CoV-2/FLU/RSV plus assay is intended as an aid in the diagnosis of influenza from Nasopharyngeal swab specimens and should not be used as a sole basis for treatment. Nasal washings and aspirates are unacceptable for Xpert Xpress SARS-CoV-2/FLU/RSV testing.  Fact Sheet for Patients: EntrepreneurPulse.com.au  Fact Sheet for Healthcare Providers: IncredibleEmployment.be  This test is not yet approved or cleared by the [place] FDA and has been authorized for detection and/or diagnosis of SARS-CoV-2 by FDA under an Emergency Use Authorization (EUA). This EUA will remain in effect (meaning this test can be used) for the duration of the COVID-19 declaration under Section 564(b)(1) of the Act, 21 U.S.C. section 360bbb-3(b)(1), unless the authorization is terminated or revoked.  Performed at [facility], [place] [address] [place], [place] [address]   Urinalysis, Routine w reflex microscopic     Status: Abnormal   Collection Time: 02/09/21 12:21 PM  Result Value Ref Range   Color, Urine RED (A) YELLOW     Comment: BIOCHEMICALS MAY BE AFFECTED BY COLOR   APPearance HAZY (A) CLEAR   Specific Gravity, Urine 1.029 1.005 - 1.030   pH 6.0 5.0 - 8.0   Glucose, UA 150 (A) NEGATIVE mg/dL   Hgb urine dipstick LARGE (A) NEGATIVE   Bilirubin Urine NEGATIVE NEGATIVE   Ketones, ur 5 (A) NEGATIVE mg/dL   Protein, ur 100 (A) NEGATIVE mg/dL   Nitrite NEGATIVE NEGATIVE   Leukocytes,Ua NEGATIVE NEGATIVE   RBC / HPF >50 (H) 0 - 5 RBC/hpf   WBC, UA 0-5 0 - 5 WBC/hpf   Bacteria, UA RARE (A) NONE SEEN   Squamous Epithelial / LPF 0-5 0 - 5    Comment: Performed at [facility], [place] [address], [place], [place] [address]  Urine rapid drug screen (hosp performed)     Status: None   Collection Time: 02/09/21 12:21 PM  Result Value Ref Range   Opiates NONE DETECTED NONE DETECTED   Cocaine NONE DETECTED NONE DETECTED   Benzodiazepines NONE DETECTED NONE DETECTED   Amphetamines NONE DETECTED NONE DETECTED   Tetrahydrocannabinol NONE DETECTED NONE DETECTED   Barbiturates NONE  DETECTED NONE DETECTED    Comment: (NOTE) DRUG SCREEN FOR MEDICAL PURPOSES ONLY.  IF CONFIRMATION IS NEEDED FOR ANY PURPOSE, NOTIFY LAB WITHIN 5 DAYS.  LOWEST DETECTABLE LIMITS FOR URINE DRUG SCREEN Drug Class                     Cutoff (ng/mL) Amphetamine and metabolites    1000 Barbiturate and metabolites    200 Benzodiazepine                 [id number] Tricyclics and metabolites     300 Opiates and metabolites        300 Cocaine and metabolites        300 THC                            50 Performed at [facility], [place] [address], [place], [place] [address]   Pregnancy, urine     Status: None   Collection Time: 02/09/21 12:21 PM  Result Value Ref Range   Preg Test, Ur NEGATIVE NEGATIVE    Comment:        THE SENSITIVITY OF THIS METHODOLOGY IS >20 mIU/mL. Performed at [facility], [place] [address] [place], [place] [address]   MRSA PCR Screening     Status: Abnormal    Collection Time: 02/09/21  1:36 PM   Specimen: Nasopharyngeal  Result Value Ref Range   MRSA by PCR POSITIVE (A) NEGATIVE    Comment:        The GeneXpert MRSA Assay (FDA approved for NASAL specimens only), is one component of a comprehensive MRSA colonization surveillance program. It is not intended to diagnose MRSA infection nor to guide or monitor treatment for MRSA infections. RESULT CALLED TO, READ BACK BY AND VERIFIED WITH: T.OLEARY, RN AT 1653 ON 03.17.22 BY N.THOMPSON Performed at [place] [address] [place], [place] [address]   Glucose, capillary     Status: Abnormal   Collection Time: 02/09/21  3:22 PM  Result Value Ref Range   Glucose-Capillary 158 (H) 70 - 99 mg/dL    Comment: Glucose reference range applies only to samples taken after fasting for at least 8 hours.   Comment 1 Notify RN    Comment 2 Document in Chart   Renal function panel (daily at 1600)     Status: Abnormal   Collection Time: 02/09/21  3:35 PM  Result Value Ref Range   Sodium 131 (L) 135 - 145 mmol/L   Potassium 5.4 (H) 3.5 - 5.1 mmol/L    Comment: DELTA CHECK NOTED REPEATED TO VERIFY    Chloride 101 98 - 111 mmol/L   CO2 17 (L) 22 - 32 mmol/L   Glucose, Bld 168 (H) 70 - 99 mg/dL    Comment: Glucose reference range applies only to samples taken after fasting for at least 8 hours.   BUN 40 (H) 6 - 20 mg/dL   Creatinine, Ser 2.67 (H) 0.44 - 1.00 mg/dL   Calcium 6.4 (LL) 8.9 - 10.3 mg/dL    Comment: CRITICAL RESULT CALLED TO, READ BACK BY AND VERIFIED WITH: T.OLEARY, RN AT 1653 ON 03.17.22 BY N.THOMPSON    Phosphorus 7.2 (H) 2.5 - 4.6 mg/dL   Albumin 3.0 (L) 3.5 - 5.0 g/dL   GFR, Estimated 21 (L) >60 mL/min    Comment: (NOTE) Calculated using the CKD-EPI Creatinine Equation (2021)    Anion gap  13 5 - 15    Comment: Performed at [facility], [place] [address], [place], [place] [address]  HIV Antibody (routine testing w rflx)     Status: None    Collection Time: 02/09/21  3:35 PM  Result Value Ref Range   HIV Screen 4th Generation wRfx Non Reactive Non Reactive    Comment: Performed at [facility], [place] [address] [place], [place] [address]  Troponin I (High Sensitivity)     Status: Abnormal   Collection Time: 02/09/21  3:35 PM  Result Value Ref Range   Troponin I (High Sensitivity) 2,787 (HH) <18 ng/L    Comment: DELTA CHECK NOTED CRITICAL VALUE NOTED.  VALUE IS CONSISTENT WITH PREVIOUSLY REPORTED AND CALLED VALUE. (NOTE) Elevated high sensitivity troponin I (hsTnI) values and significant  changes across serial measurements may suggest ACS but many other  chronic and acute conditions are known to elevate hsTnI results.  Refer to the Links section for chest pain algorithms and additional  guidance. Performed at [facility], [place] [address], [place], [place] [id number]   Basic metabolic panel     Status: Abnormal   Collection Time: 02/09/21  9:03 PM  Result Value Ref Range   Sodium 130 (L) 135 - 145 mmol/L   Potassium 5.1 3.5 - 5.1 mmol/L   Chloride 94 (L) 98 - 111 mmol/L   CO2 19 (L) 22 - 32 mmol/L   Glucose, Bld 149 (H) 70 - 99 mg/dL    Comment: Glucose reference range applies only to samples taken after fasting for at least 8 hours.   BUN 37 (H) 6 - 20 mg/dL   Creatinine, Ser 2.68 (H) 0.44 - 1.00 mg/dL   Calcium 6.8 (L) 8.9 - 10.3 mg/dL   GFR, Estimated 21 (L) >60 mL/min    Comment: (NOTE) Calculated using the CKD-EPI Creatinine Equation (2021)    Anion gap 17 (H) 5 - 15    Comment: Performed at [facility], [place] [address], [place], [place] [address]  Magnesium     Status: None   Collection Time: 02/10/21  5:06 AM  Result Value Ref Range   Magnesium 2.3 1.7 - 2.4 mg/dL    Comment: Performed at [facility], [place] [address], [place], [place] [address]  CBC     Status: Abnormal   Collection Time: 02/10/21  5:06 AM  Result Value Ref Range   WBC 21.8  (H) 4.0 - 10.5 K/uL   RBC 4.37 3.87 - 5.11 MIL/uL   Hemoglobin 12.6 12.0 - 15.0 g/dL   HCT 38.1 36.0 - 46.0 %   MCV 87.2 80.0 - 100.0 fL   MCH 28.8 26.0 - 34.0 pg   MCHC 33.1 30.0 - 36.0 g/dL   RDW 14.0 11.5 - 15.5 %   Platelets 201 150 - 400 K/uL   nRBC 0.1 0.0 - 0.2 %    Comment: Performed at [facility], [place] [address] [place], [place] [address]  Comprehensive metabolic panel     Status: Abnormal   Collection Time: 02/10/21  5:06 AM  Result Value Ref Range   Sodium 129 (L) 135 - 145 mmol/L   Potassium 5.2 (H) 3.5 - 5.1 mmol/L   Chloride 91 (L) 98 - 111 mmol/L   CO2 25 22 - 32 mmol/L   Glucose, Bld 166 (H) 70 - 99 mg/dL    Comment: Glucose reference range applies only to samples taken after fasting  for at least 8 hours.   BUN 36 (H) 6 - 20 mg/dL   Creatinine, Ser 2.54 (H) 0.44 - 1.00 mg/dL   Calcium 6.7 (L) 8.9 - 10.3 mg/dL   Total Protein 7.0 6.5 - 8.1 g/dL   Albumin 2.8 (L) 3.5 - 5.0 g/dL   AST 1,303 (H) 15 - 41 U/L   ALT 524 (H) 0 - 44 U/L   Alkaline Phosphatase 124 38 - 126 U/L   Total Bilirubin 0.9 0.3 - 1.2 mg/dL   GFR, Estimated 22 (L) >60 mL/min    Comment: (NOTE) Calculated using the CKD-EPI Creatinine Equation (2021)    Anion gap 13 5 - 15    Comment: Performed at [facility], [place] [address], [place], [place] [address]  Renal function panel (daily at 0500)     Status: Abnormal   Collection Time: 02/10/21  5:06 AM  Result Value Ref Range   Sodium 130 (L) 135 - 145 mmol/L   Potassium 5.2 (H) 3.5 - 5.1 mmol/L   Chloride 91 (L) 98 - 111 mmol/L   CO2 26 22 - 32 mmol/L   Glucose, Bld 166 (H) 70 - 99 mg/dL    Comment: Glucose reference range applies only to samples taken after fasting for at least 8 hours.   BUN 34 (H) 6 - 20 mg/dL   Creatinine, Ser 2.58 (H) 0.44 - 1.00 mg/dL   Calcium 6.8 (L) 8.9 - 10.3 mg/dL   Phosphorus 5.8 (H) 2.5 - 4.6 mg/dL   Albumin 2.9 (L) 3.5 - 5.0 g/dL   GFR, Estimated 22 (L) >60 mL/min     Comment: (NOTE) Calculated using the CKD-EPI Creatinine Equation (2021)    Anion gap 13 5 - 15    Comment: Performed at [facility], [place] [address] [place], [place] [id number]  Basic metabolic panel     Status: Abnormal   Collection Time: 02/10/21 11:44 AM  Result Value Ref Range   Sodium 131 (L) 135 - 145 mmol/L   Potassium 4.6 3.5 - 5.1 mmol/L   Chloride 91 (L) 98 - 111 mmol/L   CO2 27 22 - 32 mmol/L   Glucose, Bld 172 (H) 70 - 99 mg/dL    Comment: Glucose reference range applies only to samples taken after fasting for at least 8 hours.   BUN 31 (H) 6 - 20 mg/dL   Creatinine, Ser 2.22 (H) 0.44 - 1.00 mg/dL   Calcium 6.9 (L) 8.9 - 10.3 mg/dL   GFR, Estimated 26 (L) >60 mL/min    Comment: (NOTE) Calculated using the CKD-EPI Creatinine Equation (2021)    Anion gap 13 5 - 15    Comment: Performed at [facility], [place] [address], [place], [place] [address]  Renal function panel (daily at 1600)     Status: Abnormal   Collection Time: 02/10/21  3:55 PM  Result Value Ref Range   Sodium 132 (L) 135 - 145 mmol/L   Potassium 4.8 3.5 - 5.1 mmol/L   Chloride 93 (L) 98 - 111 mmol/L   CO2 29 22 - 32 mmol/L   Glucose, Bld 173 (H) 70 - 99 mg/dL    Comment: Glucose reference range applies only to samples taken after fasting for at least 8 hours.   BUN 29 (H) 6 - 20 mg/dL   Creatinine, Ser 2.18 (H) 0.44 - 1.00 mg/dL   Calcium 6.8 (L) 8.9 - 10.3 mg/dL   Phosphorus 4.4 2.5 - 4.6 mg/dL  Albumin 2.7 (L) 3.5 - 5.0 g/dL   GFR, Estimated 27 (L) >60 mL/min    Comment: (NOTE) Calculated using the CKD-EPI Creatinine Equation (2021)    Anion gap 10 5 - 15    Comment: Performed at [facility], [place] [address], [place], [place] [address]    CT Head Wo Contrast  Result Date: 02/09/2021 CLINICAL DATA:  Lower extremity numbness. EXAM: CT HEAD WITHOUT CONTRAST TECHNIQUE: Contiguous axial images were obtained from the base of the skull through  the vertex without intravenous contrast. COMPARISON:  None. FINDINGS: Brain: Ventricles and sulci are normal in size and configuration. There is no intracranial mass, hemorrhage, extra-axial fluid collection, or midline shift. The brain parenchyma appears unremarkable. No appreciable acute infarct. Vascular: No hyperdense vessel. There is calcification in each carotid siphon region. Skull: Bony calvarium a appears intact. Sinuses/Orbits: Opacification noted in a posterior ethmoid air cell on the right. Orbits appear symmetric bilaterally. Other: Mastoid air cells are clear. IMPRESSION: Normal appearing brain parenchyma.  No mass or hemorrhage. Foci of arterial vascular calcification noted. Opacification noted in a posterior right-sided ethmoid air cell. Electronically Signed   By: Lowella Grip III M.D.   On: 02/09/2021 11:01   DG Chest Port 1 View  Result Date: 02/09/2021 CLINICAL DATA:  Central line placement EXAM: PORTABLE CHEST 1 VIEW COMPARISON:  Portable exam 1346 hours compared to 0842 hours FINDINGS: RIGHT jugular central venous catheter with tip projecting over SVC. Normal heart size, mediastinal contours, and pulmonary vascularity. LEFT basilar atelectasis. Remaining lungs clear. No pulmonary infiltrate, pleural effusion, or pneumothorax. Osseous structures unremarkable. IMPRESSION: No pneumothorax following RIGHT jugular line placement. Subsegmental atelectasis LEFT base. Electronically Signed   By: Lavonia Dana M.D.   On: 02/09/2021 14:26   DG Chest Portable 1 View  Result Date: 02/09/2021 CLINICAL DATA:  Shortness of breath EXAM: PORTABLE CHEST 1 VIEW COMPARISON:  Field FINDINGS: There is slight atelectasis left base. Lungs otherwise are clear. Heart size and pulmonary vascularity are normal. No adenopathy. No bone lesions. IMPRESSION: Mild left base atelectasis. Lungs elsewhere clear. Heart size normal. Electronically Signed   By: Lowella Grip III M.D.   On: 02/09/2021 08:55   CT  Angio Chest/Abd/Pel for Dissection W and/or W/WO  Result Date: 02/09/2021 CLINICAL DATA:  Lower abdominal pain, left leg numbness. EXAM: CT ANGIOGRAPHY CHEST, ABDOMEN AND PELVIS TECHNIQUE: Non-contrast CT of the chest was initially obtained. Multidetector CT imaging through the chest, abdomen and pelvis was performed using the standard protocol during bolus administration of intravenous contrast. Multiplanar reconstructed images and MIPs were obtained and reviewed to evaluate the vascular anatomy. CONTRAST:  161m OMNIPAQUE IOHEXOL 350 MG/ML SOLN COMPARISON:  None. 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 Heart is normal size. Aorta is normal caliber. No dissection no filling defects in the central pulmonary arteries to suggest pulmonary emboli. Mediastinum/Nodes: No mediastinal, hilar, or axillary adenopathy. Trachea and esophagus are unremarkable. Thyroid unremarkable. Lungs/Pleura: Linear densities at the left base in the lingula, likely scarring. No confluent opacities or effusions. Musculoskeletal: Chest wall soft tissues are unremarkable. No acute bony abnormality. Review of the MIP images confirms the above findings. CTA ABDOMEN AND PELVIS FINDINGS VASCULAR Aorta: Normal caliber aorta without aneurysm, dissection, vasculitis or significant stenosis. Celiac: Patent without evidence of aneurysm, dissection, vasculitis or significant stenosis. SMA: Patent without evidence of aneurysm, dissection, vasculitis or significant stenosis. Renals: Both renal arteries are patent. No proximal stenosis. Both renal arteries  appear small caliber in the renal hila. IMA: Very small, difficult to visualize.  Appears patent. Inflow: Patent without evidence of aneurysm, dissection, vasculitis or significant stenosis. Veins: No obvious venous abnormality within the limitations of this arterial phase study. Review of the MIP images confirms the above findings.  NON-VASCULAR Hepatobiliary: No focal hepatic abnormality. Gallbladder unremarkable. Pancreas: No focal abnormality or ductal dilatation. Spleen: No focal abnormality.  Normal size. Adrenals/Urinary Tract: There are areas of wedge-shaped decreased perfusion noted throughout both kidneys. There is no surrounding perinephric stranding. No hydronephrosis. Adrenal glands and urinary bladder unremarkable. Stomach/Bowel: Stomach, large and small bowel grossly unremarkable. Small infraumbilical ventral hernia containing a small bowel loop. No evidence of bowel obstruction. Lymphatic: Aortic atherosclerosis. No evidence of aneurysm or adenopathy. Reproductive: Uterus and adnexa unremarkable.  No mass. Other: No free fluid or free air. Musculoskeletal: No acute bony abnormality. Degenerative changes in the lower lumbar spine. Review of the MIP images confirms the above findings. IMPRESSION: No evidence of aortic aneurysm or dissection. Proximal renal arteries are patent and free of disease. However, the vessels appear small caliber bilaterally within the renal hila. This is associated with extensive wedge-shaped areas of hypoperfusion throughout both kidneys. This is felt to most likely reflect some form of microvascular disease or vasoconstriction. 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 This is less likely pyelonephritis given the extensive nature of the finding and lack of perinephric stranding. Small infraumbilical ventral hernia containing small bowel loop. No bowel obstruction. These results were called by telephone at the time of interpretation on 02/09/2021 at 11:10 am to provider [facility] , who verbally acknowledged these results. Electronically Signed   By: Rolm Baptise M.D.   On: 02/09/2021 11:10   US Abdomen Limited RUQ (LIVER/GB)  Result Date: 02/10/2021 CLINICAL DATA:  50 year old female with right upper quadrant abdominal pain. Dialysis patient. EXAM: ULTRASOUND ABDOMEN  LIMITED RIGHT UPPER QUADRANT COMPARISON:  CTA chest and abdomen yesterday. FINDINGS: Gallbladder: Shadowing echogenic gallstones (image 5) individual stone size estimated at 15-20 mm. Gallbladder wall thickness remains normal. No pericholecystic fluid. Some superimposed gallbladder sludge. However, positive sonographic Percell Miller sign is reported. Common bile duct: Diameter: 6-7 mm, upper limits of normal. Liver: No focal lesion identified. Within normal limits in parenchymal echogenicity. Portal vein is patent on color Doppler imaging with normal direction of blood flow towards the liver. Other: No free fluid. IMPRESSION: 1. Difficult to exclude acute cholecystitis: positive for gallstones, sludge, and a sonographic [employer]. But there is no evidence of gallbladder wall thickening. 2. Upper limits of normal CBD.  Liver parenchyma appears negative. Electronically Signed   By: Genevie Ann M.D.   On: 02/10/2021 09:45    Review of Systems  Unable to perform ROS: Acuity of condition   Blood pressure (!) 89/55, pulse 84, temperature 98.3 F (36.8 C), temperature source Oral, resp. rate 19, height '5\' 5"'$  (1.651 m), weight 108.1 kg, SpO2 94 %. Physical Exam HENT:     Head: Atraumatic.  Eyes:     Extraocular Movements: Extraocular movements intact.  Pulmonary:     Effort: Pulmonary effort is normal.  Musculoskeletal:     Comments: Examination of the left lower extremity shows no significant swelling warmth erythema.  Palpation of the compartments both proximally in the thigh and distally in the calf show compartments to be soft and compressible with really no tenderness to deep palpation.  She has intact active dorsiflexion and plantarflexion of the toes and ankle and has no pain  with passive stretch of the ankle or knee.  The foot does feel slightly cooler to the touch than her right foot.  She has a palpable dorsalis pedis pulse and dopplerable posterior tibial pulse which is more faint than the dorsalis pedis.   She endorses absence of light touch sensation in all superficial nerve distributions in the foot and ankle in a stocking-like distribution up to about the mid tibia.  She does have intact sensation to sharp sensation with needle prick on the dorsal and plantar aspect of the foot.  Skin:    General: Skin is warm and dry.  Neurological:     Mental Status: She is alert.     Assessment/Plan: She has no exam findings concerning for compartment syndrome at this time I don't have a good explanation for the numbness in a stocking-like distribution which would go more along with some sort of peripheral neuropathy than a specific nerve.  It could be due to prolonged compression with her rhabdomyolysis which is just now being noticed as she is more alert. She does tell me that she has noticed numbness in her feet in the past. Her posterior tibial pulse is faint and given the temperature change in her foot she may benefit from further evaluation by vascular If symptoms don't improve.   Renee Wilkinson 02/10/2021, 5:13 PM

## 2021-02-10 NOTE — Progress Notes (Signed)
Increased mottling to left foot, cooler to the touch than on AM assessment and in comparison to right foot. Still able to palpate a pedal pulse. No current sensation to left foot. Experiencing tingling only with sharp touch in the mid to upper leg. With worsening noted upon reassessment Dr. Spero Curb called and made aware. He is to come and reassess.

## 2021-02-10 NOTE — Progress Notes (Signed)
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  On CRRT, 4K dialysate and bicarbonate pre and post.  Anticipate needing all 4K bath after next renal panel check.     2 Hypertension/volume  -  Running even  3. Hyperkalemia   Resolving with CRRT  4. Metabolic acidosis  Improved  5. Rhabdomyolysis  6.  Abd pain: RUQ US showing gallbladder sludge.  Per primary  7. Substance abuse: her husband is deceased following the incident.  Spiritual care is involved and psychiatry has been consulted, greatly appreciate.      Subjective:    Seen in room.  Tearful.  Reporting abdominal pain.     Objective:   BP 92/69   Pulse 79   Temp 98.3 F (36.8 C) (Oral)   Resp 20   Ht '5\' 5"'$  (1.651 m)   Wt 108.1 kg   SpO2 93%   BMI 39.66 kg/m   Intake/Output Summary (Last 24 hours) at 02/10/2021 1352 Last data filed at 02/10/2021 1300 Gross per 24 hour  Intake 4758.86 ml  Output 4713 ml  Net 45.86 ml   Weight change:   Physical Exam: Gen: lying in bed, tearful CVS: RRR Resp: clear Abd: soft, tender in RUQ Ext: no LE edema ACCESS; R IJ nontunneled HD cath  Imaging: CT Head Wo Contrast  Result Date: 02/09/2021 CLINICAL DATA:  Lower extremity numbness. EXAM: CT HEAD WITHOUT CONTRAST TECHNIQUE: Contiguous axial images were obtained from the base of the skull through the vertex without intravenous contrast. COMPARISON:  None. FINDINGS: Brain: Ventricles and sulci are normal in size and configuration. There is no intracranial mass, hemorrhage, extra-axial fluid collection, or midline shift. The brain parenchyma appears unremarkable. No appreciable acute infarct. Vascular: No hyperdense vessel. There is calcification in each carotid siphon region. Skull: Bony calvarium a appears intact. Sinuses/Orbits: Opacification noted in a posterior ethmoid air cell on the right. Orbits  appear symmetric bilaterally. Other: Mastoid air cells are clear. IMPRESSION: Normal appearing brain parenchyma.  No mass or hemorrhage. Foci of arterial vascular calcification noted. Opacification noted in a posterior right-sided ethmoid air cell. Electronically Signed   By: Lowella Grip III M.D.   On: 02/09/2021 11:01   DG Chest Port 1 View  Result Date: 02/09/2021 CLINICAL DATA:  Central line placement EXAM: PORTABLE CHEST 1 VIEW COMPARISON:  Portable exam 1346 hours compared to 0842 hours FINDINGS: RIGHT jugular central venous catheter with tip projecting over SVC. Normal heart size, mediastinal contours, and pulmonary vascularity. LEFT basilar atelectasis. Remaining lungs clear. No pulmonary infiltrate, pleural effusion, or pneumothorax. Osseous structures unremarkable. IMPRESSION: No pneumothorax following RIGHT jugular line placement. Subsegmental atelectasis LEFT base. Electronically Signed   By: Lavonia Dana M.D.   On: 02/09/2021 14:26   DG Chest Portable 1 View  Result Date: 02/09/2021 CLINICAL DATA:  Shortness of breath EXAM: PORTABLE CHEST 1 VIEW COMPARISON:  Field FINDINGS: There is slight atelectasis left base. Lungs otherwise are clear. Heart size and pulmonary vascularity are normal. No adenopathy. No bone lesions. IMPRESSION: Mild left base atelectasis. Lungs elsewhere clear. Heart size normal. Electronically Signed   By: Lowella Grip III M.D.   On: 02/09/2021 08:55   CT Angio Chest/Abd/Pel for Dissection W and/or W/WO  Result Date: 02/09/2021 CLINICAL DATA:  Lower abdominal pain, left leg numbness. EXAM: CT ANGIOGRAPHY CHEST, ABDOMEN AND PELVIS TECHNIQUE: Non-contrast CT of the  chest was initially obtained. Multidetector CT imaging through the chest, abdomen and pelvis was performed using the standard protocol during bolus administration of intravenous contrast. Multiplanar reconstructed images and MIPs were obtained and reviewed to evaluate the vascular anatomy. CONTRAST:   148m OMNIPAQUE IOHEXOL 350 MG/ML SOLN COMPARISON:  None. 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 Heart is normal size. Aorta is normal caliber. No dissection no filling defects in the central pulmonary arteries to suggest pulmonary emboli. Mediastinum/Nodes: No mediastinal, hilar, or axillary adenopathy. Trachea and esophagus are unremarkable. Thyroid unremarkable. Lungs/Pleura: Linear densities at the left base in the lingula, likely scarring. No confluent opacities or effusions. Musculoskeletal: Chest wall soft tissues are unremarkable. No acute bony abnormality. Review of the MIP images confirms the above findings. CTA ABDOMEN AND PELVIS FINDINGS VASCULAR Aorta: Normal caliber aorta without aneurysm, dissection, vasculitis or significant stenosis. Celiac: Patent without evidence of aneurysm, dissection, vasculitis or significant stenosis. SMA: Patent without evidence of aneurysm, dissection, vasculitis or significant stenosis. Renals: Both renal arteries are patent. No proximal stenosis. Both renal arteries appear small caliber in the renal hila. IMA: Very small, difficult to visualize.  Appears patent. Inflow: Patent without evidence of aneurysm, dissection, vasculitis or significant stenosis. Veins: No obvious venous abnormality within the limitations of this arterial phase study. Review of the MIP images confirms the above findings. NON-VASCULAR Hepatobiliary: No focal hepatic abnormality. Gallbladder unremarkable. Pancreas: No focal abnormality or ductal dilatation. Spleen: No focal abnormality.  Normal size. Adrenals/Urinary Tract: There are areas of wedge-shaped decreased perfusion noted throughout both kidneys. There is no surrounding perinephric stranding. No hydronephrosis. Adrenal glands and urinary bladder unremarkable. Stomach/Bowel: Stomach, large and small bowel grossly unremarkable. Small infraumbilical ventral hernia  containing a small bowel loop. No evidence of bowel obstruction. Lymphatic: Aortic atherosclerosis. No evidence of aneurysm or adenopathy. Reproductive: Uterus and adnexa unremarkable.  No mass. Other: No free fluid or free air. Musculoskeletal: No acute bony abnormality. Degenerative changes in the lower lumbar spine. Review of the MIP images confirms the above findings. IMPRESSION: No evidence of aortic aneurysm or dissection. Proximal renal arteries are patent and free of disease. However, the vessels appear small caliber bilaterally within the renal hila. This is associated with extensive wedge-shaped areas of hypoperfusion throughout both kidneys. This is felt to most likely reflect some form of microvascular disease or vasoconstriction. 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 This is less likely pyelonephritis given the extensive nature of the finding and lack of perinephric stranding. Small infraumbilical ventral hernia containing small bowel loop. No bowel obstruction. These results were called by telephone at the time of interpretation on 02/09/2021 at 11:10 am [facility] who verbally acknowledged these results. Electronically Signed   By: KRolm BaptiseM.D.   On: 02/09/2021 11:10   UKoreaAbdomen Limited RUQ (LIVER/GB)  Result Date: 02/10/2021 CLINICAL DATA:  50year old female with right upper quadrant abdominal pain. Dialysis patient. EXAM: ULTRASOUND ABDOMEN LIMITED RIGHT UPPER QUADRANT COMPARISON:  CTA chest and abdomen yesterday. FINDINGS: Gallbladder: Shadowing echogenic gallstones (image 5) individual stone size estimated at 15-20 mm. Gallbladder wall thickness remains normal. No pericholecystic fluid. Some superimposed gallbladder sludge. However, positive sonographic MPercell Millersign is reported. Common bile duct: Diameter: 6-7 mm, upper limits of normal. Liver: No focal lesion identified. Within normal limits in parenchymal echogenicity. Portal vein is  patent on color Doppler imaging with normal direction of blood flow towards the liver. Other: No free fluid. IMPRESSION:  1. Difficult to exclude acute cholecystitis: positive for gallstones, sludge, and a sonographic [employer]. But there is no evidence of gallbladder wall thickening. 2. Upper limits of normal CBD.  Liver parenchyma appears negative. Electronically Signed   By: Genevie Ann M.D.   On: 02/10/2021 09:45    Labs: BMET Recent Labs  Lab 02/09/21 0830 02/09/21 0845 02/09/21 1200 02/09/21 1535 02/09/21 2103 02/10/21 0506 02/10/21 1144  NA 136  --  131* 131* 130* 129*  130* 131*  K >7.5*  --  >7.5* 5.4* 5.1 5.2*  5.2* 4.6  CL 101  --  100 101 94* 91*  91* 91*  CO2 16*  --  14* 17* 19* '25  26 27  '$ GLUCOSE 164*  --  206* 168* 149* 166*  166* 172*  BUN 38*  --  42* 40* 37* 36*  34* 31*  CREATININE 3.39*  --  3.38* 2.67* 2.68* 2.54*  2.58* 2.22*  CALCIUM 7.5*  --  6.9* 6.4* 6.8* 6.7*  6.8* 6.9*  PHOS  --  10.7*  --  7.2*  --  5.8*  --    CBC Recent Labs  Lab 02/09/21 0830 02/10/21 0506  WBC 30.6* 21.8*  NEUTROABS 21.0*  --   HGB 15.9* 12.6  HCT 49.5* 38.1  MCV 91.2 87.2  PLT 370 201    Medications:    . Chlorhexidine Gluconate Cloth  6 each Topical Q0600  . heparin  5,000 Units Subcutaneous Q8H  . mouth rinse  15 mL Mouth Rinse BID  . mupirocin ointment  1 application Nasal BID  . sodium chloride flush  10-40 mL Intracatheter Q12H      Madelon Lips, MD 02/10/2021, 1:52 PM

## 2021-02-11 ENCOUNTER — Inpatient Hospital Stay (HOSPITAL_COMMUNITY): Payer: Medicaid - Out of State

## 2021-02-11 DIAGNOSIS — I249 Acute ischemic heart disease, unspecified: Secondary | ICD-10-CM

## 2021-02-11 DIAGNOSIS — R570 Cardiogenic shock: Secondary | ICD-10-CM

## 2021-02-11 DIAGNOSIS — Z452 Encounter for adjustment and management of vascular access device: Secondary | ICD-10-CM | POA: Diagnosis not present

## 2021-02-11 DIAGNOSIS — M6282 Rhabdomyolysis: Secondary | ICD-10-CM | POA: Diagnosis not present

## 2021-02-11 DIAGNOSIS — N179 Acute kidney failure, unspecified: Secondary | ICD-10-CM | POA: Diagnosis not present

## 2021-02-11 LAB — ECHOCARDIOGRAM COMPLETE
Area-P 1/2: 2.87 cm2
Height: 65 in
S' Lateral: 3 cm
Weight: 3813.08 oz

## 2021-02-11 LAB — RENAL FUNCTION PANEL
Albumin: 2.4 g/dL — ABNORMAL LOW (ref 3.5–5.0)
Albumin: 2.2 g/dL — ABNORMAL LOW (ref 3.5–5.0)
Anion gap: 9 (ref 5–15)
Anion gap: 7 (ref 5–15)
BUN: 23 mg/dL — ABNORMAL HIGH (ref 6–20)
BUN: 27 mg/dL — ABNORMAL HIGH (ref 6–20)
CO2: 26 mmol/L (ref 22–32)
CO2: 25 mmol/L (ref 22–32)
Calcium: 7.4 mg/dL — ABNORMAL LOW (ref 8.9–10.3)
Calcium: 6.8 mg/dL — ABNORMAL LOW (ref 8.9–10.3)
Chloride: 94 mmol/L — ABNORMAL LOW (ref 98–111)
Chloride: 101 mmol/L (ref 98–111)
Creatinine, Ser: 2.27 mg/dL — ABNORMAL HIGH (ref 0.44–1.00)
Creatinine, Ser: 2.52 mg/dL — ABNORMAL HIGH (ref 0.44–1.00)
GFR, Estimated: 26 mL/min — ABNORMAL LOW (ref >60)
GFR, Estimated: 23 mL/min — ABNORMAL LOW (ref >60)
Glucose, Bld: 154 mg/dL — ABNORMAL HIGH (ref 70–99)
Glucose, Bld: 115 mg/dL — ABNORMAL HIGH (ref 70–99)
Phosphorus: 3.4 mg/dL (ref 2.5–4.6)
Phosphorus: 2.5 mg/dL (ref 2.5–4.6)
Potassium: 5.2 mmol/L — ABNORMAL HIGH (ref 3.5–5.1)
Potassium: 4.6 mmol/L (ref 3.5–5.1)
Sodium: 129 mmol/L — ABNORMAL LOW (ref 135–145)
Sodium: 133 mmol/L — ABNORMAL LOW (ref 135–145)

## 2021-02-11 LAB — COOXEMETRY PANEL
Carboxyhemoglobin: 1.6 % — ABNORMAL HIGH (ref 0.5–1.5)
Methemoglobin: 0.9 % (ref 0.0–1.5)
O2 Saturation: 55.9 %
Total hemoglobin: 10 g/dL — ABNORMAL LOW (ref 12.0–16.0)

## 2021-02-11 LAB — MAGNESIUM: Magnesium: 2.4 mg/dL (ref 1.7–2.4)

## 2021-02-11 LAB — COMPREHENSIVE METABOLIC PANEL
ALT: 387 U/L — ABNORMAL HIGH (ref 0–44)
AST: 808 U/L — ABNORMAL HIGH (ref 15–41)
Albumin: 2.4 g/dL — ABNORMAL LOW (ref 3.5–5.0)
Alkaline Phosphatase: 99 U/L (ref 38–126)
Anion gap: 8 (ref 5–15)
BUN: 23 mg/dL — ABNORMAL HIGH (ref 6–20)
CO2: 27 mmol/L (ref 22–32)
Calcium: 7.4 mg/dL — ABNORMAL LOW (ref 8.9–10.3)
Chloride: 94 mmol/L — ABNORMAL LOW (ref 98–111)
Creatinine, Ser: 2.24 mg/dL — ABNORMAL HIGH (ref 0.44–1.00)
GFR, Estimated: 26 mL/min — ABNORMAL LOW (ref >60)
Glucose, Bld: 153 mg/dL — ABNORMAL HIGH (ref 70–99)
Potassium: 5.2 mmol/L — ABNORMAL HIGH (ref 3.5–5.1)
Sodium: 129 mmol/L — ABNORMAL LOW (ref 135–145)
Total Bilirubin: 0.8 mg/dL (ref 0.3–1.2)
Total Protein: 6.4 g/dL — ABNORMAL LOW (ref 6.5–8.1)

## 2021-02-11 LAB — CBC WITH DIFFERENTIAL/PLATELET
Abs Immature Granulocytes: 0.15 10*3/uL — ABNORMAL HIGH (ref 0.00–0.07)
Basophils Absolute: 0.1 10*3/uL (ref 0.0–0.1)
Basophils Relative: 0 %
Eosinophils Absolute: 0 10*3/uL (ref 0.0–0.5)
Eosinophils Relative: 0 %
HCT: 31.9 % — ABNORMAL LOW (ref 36.0–46.0)
Hemoglobin: 10.5 g/dL — ABNORMAL LOW (ref 12.0–15.0)
Immature Granulocytes: 1 %
Lymphocytes Relative: 17 %
Lymphs Abs: 2.8 10*3/uL (ref 0.7–4.0)
MCH: 29 pg (ref 26.0–34.0)
MCHC: 32.9 g/dL (ref 30.0–36.0)
MCV: 88.1 fL (ref 80.0–100.0)
Monocytes Absolute: 1.4 10*3/uL — ABNORMAL HIGH (ref 0.1–1.0)
Monocytes Relative: 9 %
Neutro Abs: 11.7 10*3/uL — ABNORMAL HIGH (ref 1.7–7.7)
Neutrophils Relative %: 73 %
Platelets: 151 10*3/uL (ref 150–400)
RBC: 3.62 MIL/uL — ABNORMAL LOW (ref 3.87–5.11)
RDW: 14.1 % (ref 11.5–15.5)
WBC: 16.2 10*3/uL — ABNORMAL HIGH (ref 4.0–10.5)
nRBC: 0.1 % (ref 0.0–0.2)

## 2021-02-11 LAB — BASIC METABOLIC PANEL
Anion gap: 9 (ref 5–15)
Anion gap: 9 (ref 5–15)
BUN: 26 mg/dL — ABNORMAL HIGH (ref 6–20)
BUN: 24 mg/dL — ABNORMAL HIGH (ref 6–20)
CO2: 25 mmol/L (ref 22–32)
CO2: 23 mmol/L (ref 22–32)
Calcium: 7.4 mg/dL — ABNORMAL LOW (ref 8.9–10.3)
Calcium: 7.4 mg/dL — ABNORMAL LOW (ref 8.9–10.3)
Chloride: 94 mmol/L — ABNORMAL LOW (ref 98–111)
Chloride: 97 mmol/L — ABNORMAL LOW (ref 98–111)
Creatinine, Ser: 2.91 mg/dL — ABNORMAL HIGH (ref 0.44–1.00)
Creatinine, Ser: 2.7 mg/dL — ABNORMAL HIGH (ref 0.44–1.00)
GFR, Estimated: 19 mL/min — ABNORMAL LOW (ref >60)
GFR, Estimated: 21 mL/min — ABNORMAL LOW (ref >60)
Glucose, Bld: 129 mg/dL — ABNORMAL HIGH (ref 70–99)
Glucose, Bld: 112 mg/dL — ABNORMAL HIGH (ref 70–99)
Potassium: 5.1 mmol/L (ref 3.5–5.1)
Potassium: 4.8 mmol/L (ref 3.5–5.1)
Sodium: 128 mmol/L — ABNORMAL LOW (ref 135–145)
Sodium: 129 mmol/L — ABNORMAL LOW (ref 135–145)

## 2021-02-11 LAB — APTT: aPTT: 38 seconds — ABNORMAL HIGH (ref 24–36)

## 2021-02-11 IMAGING — DX DG CHEST 1V
1 series · 1 of 1 positions shown · non-contrast
Comparison: [DATE]

CLINICAL DATA: Central line placement

EXAM:
CHEST  1 VIEW

[chest ap]
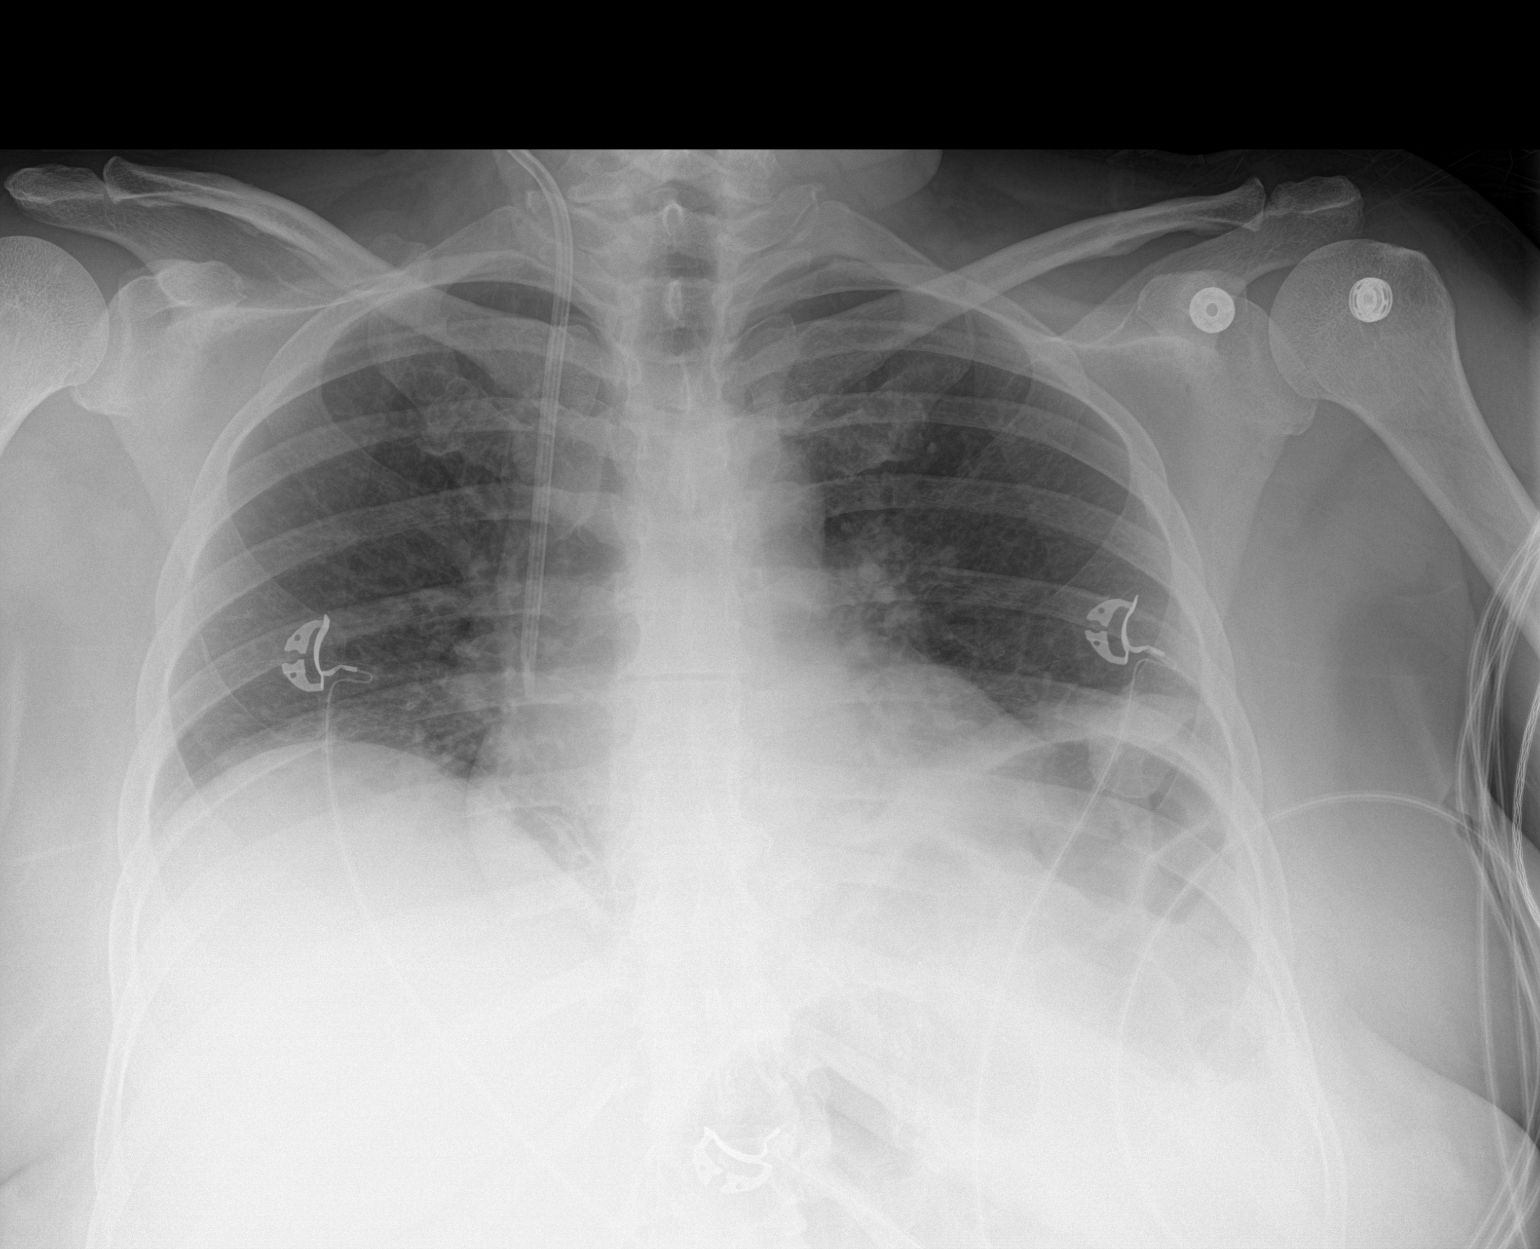

[1 of 1 positions shown; findings below may reference images not displayed]

FINDINGS: Right internal jugular center venous catheter tip: Lower SVC.

No pneumothorax. Low lung volumes. Subsegmental atelectasis along
both hemidiaphragms. Cardiac and mediastinal margins appear normal.
IMPRESSION: 1. Right internal jugular center venous catheter tip: Lower SVC. No
pneumothorax.
2. Subsegmental atelectasis along both hemidiaphragms.

## 2021-02-11 MED ORDER — PHENYLEPHRINE CONCENTRATED 100MG/250ML (0.4 MG/ML) INFUSION SIMPLE
0.0000 ug/min | INTRAVENOUS | Status: DC
  Administered 2021-02-11: 60 ug/min via INTRAVENOUS
  Filled 2021-02-11: qty 250

## 2021-02-11 MED ORDER — PHENYLEPHRINE HCL-NACL 10-0.9 MG/250ML-% IV SOLN
INTRAVENOUS | Status: AC
  Administered 2021-02-11: 20 ug/min via INTRAVENOUS
  Filled 2021-02-11: qty 250

## 2021-02-11 MED ORDER — ALTEPLASE 2 MG IJ SOLR
2.0000 mg | Freq: Once | INTRAMUSCULAR | Status: AC
  Administered 2021-02-11: 2 mg

## 2021-02-11 MED ORDER — PERFLUTREN LIPID MICROSPHERE
1.0000 mL | INTRAVENOUS | Status: AC | PRN
  Administered 2021-02-11: 3 mL via INTRAVENOUS
  Filled 2021-02-11: qty 10

## 2021-02-11 MED ORDER — SODIUM CHLORIDE 0.9 % IV BOLUS
500.0000 mL | Freq: Once | INTRAVENOUS | Status: AC
  Administered 2021-02-11: 500 mL via INTRAVENOUS

## 2021-02-11 MED ORDER — SODIUM ZIRCONIUM CYCLOSILICATE 10 G PO PACK
10.0000 g | PACK | Freq: Once | ORAL | Status: AC
  Administered 2021-02-11: 10 g via ORAL
  Filled 2021-02-11: qty 1

## 2021-02-11 MED ORDER — SODIUM CHLORIDE 0.9 % IV SOLN: 250.0000 [IU]/h | INTRAVENOUS | Status: DC

## 2021-02-11 MED ORDER — SODIUM CHLORIDE 0.9 % IV SOLN: 500.0000 [IU]/h | INTRAVENOUS | Status: DC

## 2021-02-11 MED ORDER — CALCIUM GLUCONATE-NACL 1-0.675 GM/50ML-% IV SOLN
1.0000 g | Freq: Once | INTRAVENOUS | Status: AC
  Administered 2021-02-11: 1000 mg via INTRAVENOUS
  Filled 2021-02-11: qty 50

## 2021-02-11 MED ORDER — SODIUM CHLORIDE 0.9 % IV SOLN: INTRAVENOUS | Status: DC

## 2021-02-11 MED ORDER — STERILE WATER FOR INJECTION IJ SOLN
INTRAMUSCULAR | Status: AC
  Filled 2021-02-11: qty 10

## 2021-02-11 MED ORDER — PHENYLEPHRINE HCL-NACL 10-0.9 MG/250ML-% IV SOLN: 0.0000 ug/min | INTRAVENOUS | Status: DC

## 2021-02-11 MED ORDER — NOREPINEPHRINE 4 MG/250ML-% IV SOLN: 0.0000 ug/min | INTRAVENOUS | Status: DC

## 2021-02-11 MED ORDER — NOREPINEPHRINE 16 MG/250ML-% IV SOLN
0.0000 ug/min | INTRAVENOUS | Status: DC
  Administered 2021-02-11: 7 ug/min via INTRAVENOUS
  Filled 2021-02-11: qty 250

## 2021-02-11 MED ORDER — SODIUM CHLORIDE 0.9 % IV SOLN
750.0000 [IU]/h | INTRAVENOUS | Status: DC
  Administered 2021-02-11 – 2021-02-14 (×3): 500 [IU]/h via INTRAVENOUS_CENTRAL
  Administered 2021-02-15: 750 [IU]/h via INTRAVENOUS_CENTRAL
  Filled 2021-02-11 (×2): qty 10000
  Filled 2021-02-11: qty 2
  Filled 2021-02-11: qty 10000
  Filled 2021-02-11 (×3): qty 2

## 2021-02-11 NOTE — Progress Notes (Signed)
NAME:  Renee Wilkinson, MRN:  [id number], DOB:  04/25/1971, LOS: 2 ADMISSION DATE:  02/09/2021, CONSULTATION DATE:  3/17 REFERRING MD:  EDP, CHIEF COMPLAINT:  Abdominal pain   Brief History:  50 year old admits to cocaine use who was found down beside that husband admitted with hyperkalemia, renal failure, shock liver with chief complaint of abdominal pain with imaging consistent with renal infarcts. Admitted to ICU, intubated, CRRT.  Pertinent  Medical History  Psychosis with behavioral health admit in 2021  Significant Hospital Events: Including procedures, antibiotic start and stop dates in addition to other pertinent events   . Admitted to ICU 3/17, VDRF, started CRRT . 3/17 ETT >3/17 . 3/17 R IJ HD cath . 3/18 more awake, mottling of feet, numbness left leg> concern for compartment syndrome, consulted orthopedics, no compartment syndrome, started on neosynephrine overnight, psychiatry consult . 3/18 RUQ ultrasound > positive murphy's but no GB wall thickening; there are stones and sludge present, upper limits of normal CBD . 3/19 coox 55, awake,   Interim History / Subjective:   . 3/18 more awake, mottling of feet, numbness left leg> concern for compartment syndrome, consulted orthopedics, no compartment syndrome, started on neosynephrine overnight . 3/19 coox 55, awake, numbness resolved in leg, remains oliguric  Objective   Blood pressure 94/60, pulse 65, temperature 98.4 F (36.9 C), temperature source Oral, resp. rate (!) 21, height '5\' 5"'$  (1.651 m), weight 108.1 kg, SpO2 98 %.        Intake/Output Summary (Last 24 hours) at 02/11/2021 0840 Last data filed at 02/11/2021 0700 Gross per 24 hour  Intake 5249.79 ml  Output 4333 ml  Net 916.79 ml   Filed Weights   02/09/21 0755 02/10/21 0500  Weight: 104.3 kg 108.1 kg    Examination:  General:  Resting comfortably in bed HENT: NCAT OP clear PULM: CTA B, normal effort CV: RRR, no mgr GI: BS+, soft, nontender MSK:  normal bulk and tone Derm: some mottling feet, but improved today, warmer today Neuro: awake, alert, no distress, MAEW    Labs/imaging personally reviewed   3/17 SARS COV 2/Flu > neg 3/17 blood cx >   Resolved Hospital Problem list   Acute respiratory failure with hypoxemia due to inability to protect airway  Assessment & Plan:  Acute metabolic encephalopathy due to drug overdose> resolved Need for sedation for mechanical ventilation > resolved Suicidal ideation> contracts for safety, no plan, has been seen by psyche who feels she doesn't need inpatient care Acute grief> husband died Monitor neuro status Minimize sedation F/u with psyche after discharge  AKI, rhabdomyolysis left leg/hip Renal infarct Restart saline infusion, low dose given low CVP Continue CRRT per renal Monitor UOP  Shock liver> improving LFT on 3/19 new RUQ pain 3/18 > better 3/19 APAP negative on admission LFT PRN Serial exams If worsening RUQ pain then HIDA  Demand ischemia Concern for cardiogenic shock 3/19 Monitor coox Monitor CVP Change neosynephrine to levophed Echo  Numbness left leg> improving; 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 Appreciate orthopedics seeing her  Best practice (evaluated daily)  Diet:  Oral Pain/Anxiety/Delirium protocol (if indicated): No VAP protocol (if indicated): Not indicated DVT prophylaxis: Subcutaneous Heparin GI prophylaxis: N/A Glucose control:  SSI No Central venous access:  Yes, and it is still needed Arterial line:  N/A Foley:  Yes, and it is still needed Mobility:  OOB  PT consulted: Yes Last date of multidisciplinary goals of care discussion [3/17] I updated her daughter by phone  on 3/19 Code Status:  full code Disposition: remain in ICU  Labs   CBC: Recent Labs  Lab 02/09/21 0830 02/10/21 0506 02/11/21 0402  WBC 30.6* 21.8* 16.2*  NEUTROABS 21.0*  --  11.7*  HGB 15.9* 12.6 10.5*  HCT 49.5* 38.1 31.9*  MCV 91.2  87.2 88.1  PLT 370 201 [id number]    Basic Metabolic Panel: Recent Labs  Lab 02/09/21 0845 02/09/21 1200 02/09/21 1535 02/09/21 2103 02/10/21 0506 02/10/21 1144 02/10/21 1555 02/10/21 2035 02/11/21 0402  NA  --    < > 131*   < > 129*  130* 131* 132* 127* 129*  129*  K  --    < > 5.4*   < > 5.2*  5.2* 4.6 4.8 4.9 5.2*  5.2*  CL  --    < > 101   < > 91*  91* 91* 93* 91* 94*  94*  CO2  --    < > 17*   < > '25  26 27 29 25 27  26  '$ GLUCOSE  --    < > 168*   < > 166*  166* 172* 173* 177* 153*  154*  BUN  --    < > 40*   < > 36*  34* 31* 29* 26* 23*  23*  CREATININE  --    < > 2.67*   < > 2.54*  2.58* 2.22* 2.18* 2.46* 2.24*  2.27*  CALCIUM  --    < > 6.4*   < > 6.7*  6.8* 6.9* 6.8* 7.1* 7.4*  7.4*  MG 3.1*  --   --   --  2.3  --   --   --  2.4  PHOS 10.7*  --  7.2*  --  5.8*  --  4.4  --  3.4   < > = values in this interval not displayed.   GFR: Estimated Creatinine Clearance: 36.2 mL/min (A) (by C-G formula based on SCr of 2.27 mg/dL (H)). Recent Labs  Lab 02/09/21 0830 02/09/21 0845 02/09/21 1108 02/10/21 0506 02/11/21 0402  WBC 30.6*  --   --  21.8* 16.2*  LATICACIDVEN  --  4.4* 4.0*  --   --     Liver Function Tests: Recent Labs  Lab 02/09/21 0830 02/09/21 1535 02/10/21 0506 02/10/21 1555 02/11/21 0402  AST 1,411*  --  1,303*  --  808*  ALT 467*  --  524*  --  387*  ALKPHOS 199*  --  124  --  99  BILITOT 0.7  --  0.9  --  0.8  PROT 9.6*  --  7.0  --  6.4*  ALBUMIN 4.0 3.0* 2.8*  2.9* 2.7* 2.4*  2.4*   No results for input(s): LIPASE, AMYLASE in the last 168 hours. No results for input(s): AMMONIA in the last 168 hours.  ABG    Component Value Date/Time   HCO3 14.7 (L) 02/09/2021 1105   ACIDBASEDEF 12.7 (H) 02/09/2021 1105   O2SAT 68.3 02/09/2021 1105     Coagulation Profile: No results for input(s): INR, PROTIME in the last 168 hours.  Cardiac Enzymes: Recent Labs  Lab 02/09/21 0830  CKTOTAL >50,000*    HbA1C: No results found for:  HGBA1C  CBG: Recent Labs  Lab 02/09/21 1522  GLUCAP 158*     Critical care time: 35 minutes     [name], MD [place] [facility] Pager: [phone] Cell: [phone] If no response, please call [phone] until 7pm  After 7:00 pm call Elink  [phone]

## 2021-02-11 NOTE — Progress Notes (Signed)
[facility] Progress Note    Assessment/ Plan:    1.Renal-  Acute Kidney Injury with CK 50 K, CTA abdomen showed likely extensive bilateral renal infarcts.  On CRRT, 4K dialysate, heparin at 500 u/ hr flat rate.  Starting to make a little urine now.    2 Hypertension/volume  -  Running even--> CVP 6-7, got a small bolus, anticipate we'll need to pull fluid in the next 24 hrs or so  3. Hyperkalemia   Resolving with CRRT  4. Metabolic acidosis  Improved  5. Rhabdomyolysis: was down for > 6 hrs  6.  Abd pain: RUQ US showing gallbladder sludge.  Per primary  7. Substance abuse: her husband is deceased following the incident.  Spiritual care is involved and psychiatry has been consulted, greatly appreciate.   8.  Likely cardiogenic shock: on levophed, troponins peaked at > 4000, TTE has been done and is pending  9.  Dispo: remains in ICU.  Subjective:    Had worsening mottling yesterday and leg numbness.  Evaluated for compartment syndrome was negative.  Required pressor overnight, was on phenylephrine, changed to levophed.  Suspicion of cardiogenic shock, TTE performed and is pending   Objective:   BP (!) 109/54   Pulse 74   Temp 99.5 F (37.5 C) (Axillary)   Resp (!) 21   Ht '5\' 5"'$  (1.651 m)   Wt 108.1 kg   SpO2 95%   BMI 39.66 kg/m   Intake/Output Summary (Last 24 hours) at 02/11/2021 1229 Last data filed at 02/11/2021 1100 Gross per 24 hour  Intake 4964.75 ml  Output 3305.5 ml  Net 1659.25 ml   Weight change:   Physical Exam: Gen: lying in bed, quiet today CVS: RRR Resp: clear Abd: soft, nontender today Ext: 1+ LE edema ACCESS; R IJ nontunneled HD cath  Imaging: DG Chest Port 1 View  Result Date: 02/09/2021 CLINICAL DATA:  Central line placement EXAM: PORTABLE CHEST 1 VIEW COMPARISON:  Portable exam 1346 hours compared to 0842 hours FINDINGS: RIGHT jugular central venous catheter with tip projecting over SVC. Normal heart size, mediastinal  contours, and pulmonary vascularity. LEFT basilar atelectasis. Remaining lungs clear. No pulmonary infiltrate, pleural effusion, or pneumothorax. Osseous structures unremarkable. IMPRESSION: No pneumothorax following RIGHT jugular line placement. Subsegmental atelectasis LEFT base. Electronically Signed   By: Lavonia Dana M.D.   On: 02/09/2021 14:26   US Abdomen Limited RUQ (LIVER/GB)  Result Date: 02/10/2021 CLINICAL DATA:  50 year old female with right upper quadrant abdominal pain. Dialysis patient. EXAM: ULTRASOUND ABDOMEN LIMITED RIGHT UPPER QUADRANT COMPARISON:  CTA chest and abdomen yesterday. FINDINGS: Gallbladder: Shadowing echogenic gallstones (image 5) individual stone size estimated at 15-20 mm. Gallbladder wall thickness remains normal. No pericholecystic fluid. Some superimposed gallbladder sludge. However, positive sonographic Percell Miller sign is reported. Common bile duct: Diameter: 6-7 mm, upper limits of normal. Liver: No focal lesion identified. Within normal limits in parenchymal echogenicity. Portal vein is patent on color Doppler imaging with normal direction of blood flow towards the liver. Other: No free fluid. IMPRESSION: 1. Difficult to exclude acute cholecystitis: positive for gallstones, sludge, and a sonographic [employer]. But there is no evidence of gallbladder wall thickening. 2. Upper limits of normal CBD.  Liver parenchyma appears negative. Electronically Signed   By: Genevie Ann M.D.   On: 02/10/2021 09:45    Labs: BMET Recent Labs  Lab 02/09/21 0845 02/09/21 1200 02/09/21 1535 02/09/21 2103 02/10/21 0506 02/10/21 1144 02/10/21 1555 02/10/21 2035 02/11/21 0402 02/11/21  1138  NA  --    < > 131* 130* 129*  130* 131* 132* 127* 129*  129* 128*  K  --    < > 5.4* 5.1 5.2*  5.2* 4.6 4.8 4.9 5.2*  5.2* 5.1  CL  --    < > 101 94* 91*  91* 91* 93* 91* 94*  94* 94*  CO2  --    < > 17* 19* '25  26 27 29 25 27  26 25  '$ GLUCOSE  --    < > 168* 149* 166*  166* 172* 173*  177* 153*  154* 129*  BUN  --    < > 40* 37* 36*  34* 31* 29* 26* 23*  23* 26*  CREATININE  --    < > 2.67* 2.68* 2.54*  2.58* 2.22* 2.18* 2.46* 2.24*  2.27* 2.91*  CALCIUM  --    < > 6.4* 6.8* 6.7*  6.8* 6.9* 6.8* 7.1* 7.4*  7.4* 7.4*  PHOS 10.7*  --  7.2*  --  5.8*  --  4.4  --  3.4  --    < > = values in this interval not displayed.   CBC Recent Labs  Lab 02/09/21 0830 02/10/21 0506 02/11/21 0402  WBC 30.6* 21.8* 16.2*  NEUTROABS 21.0*  --  11.7*  HGB 15.9* 12.6 10.5*  HCT 49.5* 38.1 31.9*  MCV 91.2 87.2 88.1  PLT 370 201 151    Medications:    . Chlorhexidine Gluconate Cloth  6 each Topical Q0600  . heparin  5,000 Units Subcutaneous Q8H  . mouth rinse  15 mL Mouth Rinse BID  . mupirocin ointment  1 application Nasal BID  . sodium chloride flush  10-40 mL Intracatheter Q12H      Madelon Lips, MD 02/11/2021, 12:29 PM

## 2021-02-11 NOTE — Progress Notes (Signed)
eLink Physician-Brief Progress Note Patient Name: [name] DOB: 03-Feb-1971 MRN: [id number]   Date of Service  02/11/2021  HPI/Events of Note  Hypotension - BP = 72/48 with MAP = 51.   eICU Interventions  Plan: 1. Phenylephrine IV infusion. Titrate to MAP >= 65.. 2. Bolus with 0.9 NaCl 500 mL IV over 30 minutes now.      Intervention Category Major Interventions: Hypotension - evaluation and management  Gaylia Kassel Eugene 02/11/2021, 4:16 AM

## 2021-02-11 NOTE — Procedures (Signed)
Hemodialysis Catheter Insertion Procedure Note: Exchange over a wire Renee Wilkinson [id number] [birth year]  Procedure: Insertion of Central Venous Catheter over a wire Indications: Previous catheter clotted off, did not improve flow with TPA  Procedure Details Consent: Risks of procedure as well as the alternatives and risks of each were explained to the (patient/caregiver).  Consent for procedure obtained. Time Out: Verified patient identification, verified procedure, site/side was marked, verified correct patient position, special equipment/implants available, medications/allergies/relevent history reviewed, required imaging and test results available.  Performed  Evaluated several options for catheter placement including new catheter insertion in left IJ or femoral veins.  The patient is awake and alert and some what agitated so it is difficult for her to lie still and is still suffering from severe leg pain from rhabdomyolysis.  Given the available options, the best option was determined to be exchange of the catheter over a wire as we do not think the catheter is infected and her ability to lie still for a new catheter is limited.  The dressing over the existing catheter was removed and the sutures were removed as well. The catheter and surrounding skin were cleaned with chlorhexidine.    Maximum sterile technique was used including antiseptics, cap, gloves, gown, hand hygiene, mask and sheet. Skin prep: Chlorhexidine; local anesthetic administered Using sterile technique a sterile wire was passed in the existing catheter pigtail port.  The catheter was removed, then  triple lumen 15 cm hemodialysis catheter was placed in the right internal jugular vein using the Seldinger technique.  Ultrasound was used to verify the patency of the vein and for real time needle guidance.  Evaluation Blood flow good Complications: No apparent complications Patient did tolerate procedure well. Chest X-ray  ordered to verify placement.  CXR: pending.  Renee Wilkinson 02/11/2021, 2:19 PM

## 2021-02-11 NOTE — Progress Notes (Signed)
Norepinephrine changed to quad strength per RN request.   Lenis Noon, PharmD 02/11/21 10:52 AM

## 2021-02-11 NOTE — Progress Notes (Signed)
Filter clotted, MD notified that this is the second time the filter clotted since initiating CRRT. Instructed to use a flat rate of heparin at 500 units/hour. System taken down, heparin instilled in blue and red ports to maintain patency while changing system. Once CRRT was ready to be restarted heparin was pulled back from red port with great blood return. Attempted to pull heparin back from blue port, but was unable to pull back. IV team consult placed for TPa. CCM and nephrology notified of access issues. Will wait for TPa therapy and attempt to restart CRRT.

## 2021-02-11 NOTE — Progress Notes (Signed)
PT Cancellation Note  Patient Details Name: Renee Wilkinson MRN: [id number] DOB: 1970-12-10   Cancelled Treatment:     PT order received but eval deferred by RN - pt lethargic and hypotensive.  Will follow.   Adilenne Ashworth 02/11/2021, 9:51 AM

## 2021-02-11 NOTE — Progress Notes (Signed)
  Echocardiogram 2D Echocardiogram has been performed.  Renee Wilkinson 02/11/2021, 12:39 PM

## 2021-02-12 ENCOUNTER — Inpatient Hospital Stay (HOSPITAL_COMMUNITY): Payer: Medicaid - Out of State

## 2021-02-12 DIAGNOSIS — M6282 Rhabdomyolysis: Secondary | ICD-10-CM | POA: Diagnosis not present

## 2021-02-12 DIAGNOSIS — R45851 Suicidal ideations: Secondary | ICD-10-CM | POA: Diagnosis not present

## 2021-02-12 DIAGNOSIS — T40604A Poisoning by unspecified narcotics, undetermined, initial encounter: Secondary | ICD-10-CM

## 2021-02-12 DIAGNOSIS — N179 Acute kidney failure, unspecified: Secondary | ICD-10-CM | POA: Diagnosis not present

## 2021-02-12 LAB — RENAL FUNCTION PANEL
Albumin: 2.4 g/dL — ABNORMAL LOW (ref 3.5–5.0)
Albumin: 2.5 g/dL — ABNORMAL LOW (ref 3.5–5.0)
Albumin: 2.6 g/dL — ABNORMAL LOW (ref 3.5–5.0)
Anion gap: 9 (ref 5–15)
Anion gap: 8 (ref 5–15)
Anion gap: 10 (ref 5–15)
BUN: 20 mg/dL (ref 6–20)
BUN: 20 mg/dL (ref 6–20)
BUN: 23 mg/dL — ABNORMAL HIGH (ref 6–20)
CO2: 24 mmol/L (ref 22–32)
CO2: 25 mmol/L (ref 22–32)
CO2: 24 mmol/L (ref 22–32)
Calcium: 7.6 mg/dL — ABNORMAL LOW (ref 8.9–10.3)
Calcium: 7.7 mg/dL — ABNORMAL LOW (ref 8.9–10.3)
Calcium: 7.6 mg/dL — ABNORMAL LOW (ref 8.9–10.3)
Chloride: 97 mmol/L — ABNORMAL LOW (ref 98–111)
Chloride: 96 mmol/L — ABNORMAL LOW (ref 98–111)
Chloride: 97 mmol/L — ABNORMAL LOW (ref 98–111)
Creatinine, Ser: 2.46 mg/dL — ABNORMAL HIGH (ref 0.44–1.00)
Creatinine, Ser: 2.72 mg/dL — ABNORMAL HIGH (ref 0.44–1.00)
Creatinine, Ser: 3.02 mg/dL — ABNORMAL HIGH (ref 0.44–1.00)
GFR, Estimated: 23 mL/min — ABNORMAL LOW (ref >60)
GFR, Estimated: 21 mL/min — ABNORMAL LOW (ref >60)
GFR, Estimated: 18 mL/min — ABNORMAL LOW (ref >60)
Glucose, Bld: 129 mg/dL — ABNORMAL HIGH (ref 70–99)
Glucose, Bld: 127 mg/dL — ABNORMAL HIGH (ref 70–99)
Glucose, Bld: 141 mg/dL — ABNORMAL HIGH (ref 70–99)
Phosphorus: 2.7 mg/dL (ref 2.5–4.6)
Phosphorus: 2.7 mg/dL (ref 2.5–4.6)
Phosphorus: 3.1 mg/dL (ref 2.5–4.6)
Potassium: 4.8 mmol/L (ref 3.5–5.1)
Potassium: 4.7 mmol/L (ref 3.5–5.1)
Potassium: 4.9 mmol/L (ref 3.5–5.1)
Sodium: 130 mmol/L — ABNORMAL LOW (ref 135–145)
Sodium: 129 mmol/L — ABNORMAL LOW (ref 135–145)
Sodium: 131 mmol/L — ABNORMAL LOW (ref 135–145)

## 2021-02-12 LAB — CBC
HCT: 28.4 % — ABNORMAL LOW (ref 36.0–46.0)
Hemoglobin: 9.2 g/dL — ABNORMAL LOW (ref 12.0–15.0)
MCH: 29.1 pg (ref 26.0–34.0)
MCHC: 32.4 g/dL (ref 30.0–36.0)
MCV: 89.9 fL (ref 80.0–100.0)
Platelets: 133 10*3/uL — ABNORMAL LOW (ref 150–400)
RBC: 3.16 MIL/uL — ABNORMAL LOW (ref 3.87–5.11)
RDW: 14.1 % (ref 11.5–15.5)
WBC: 18.8 10*3/uL — ABNORMAL HIGH (ref 4.0–10.5)
nRBC: 0.4 % — ABNORMAL HIGH (ref 0.0–0.2)

## 2021-02-12 LAB — BASIC METABOLIC PANEL
Anion gap: 9 (ref 5–15)
Anion gap: 9 (ref 5–15)
BUN: 20 mg/dL (ref 6–20)
BUN: 25 mg/dL — ABNORMAL HIGH (ref 6–20)
CO2: 25 mmol/L (ref 22–32)
CO2: 24 mmol/L (ref 22–32)
Calcium: 7.8 mg/dL — ABNORMAL LOW (ref 8.9–10.3)
Calcium: 7.5 mg/dL — ABNORMAL LOW (ref 8.9–10.3)
Chloride: 97 mmol/L — ABNORMAL LOW (ref 98–111)
Chloride: 98 mmol/L (ref 98–111)
Creatinine, Ser: 2.56 mg/dL — ABNORMAL HIGH (ref 0.44–1.00)
Creatinine, Ser: 3.44 mg/dL — ABNORMAL HIGH (ref 0.44–1.00)
GFR, Estimated: 22 mL/min — ABNORMAL LOW (ref >60)
GFR, Estimated: 16 mL/min — ABNORMAL LOW (ref >60)
Glucose, Bld: 129 mg/dL — ABNORMAL HIGH (ref 70–99)
Glucose, Bld: 137 mg/dL — ABNORMAL HIGH (ref 70–99)
Potassium: 4.7 mmol/L (ref 3.5–5.1)
Potassium: 4.8 mmol/L (ref 3.5–5.1)
Sodium: 131 mmol/L — ABNORMAL LOW (ref 135–145)
Sodium: 131 mmol/L — ABNORMAL LOW (ref 135–145)

## 2021-02-12 LAB — COMPREHENSIVE METABOLIC PANEL
ALT: 307 U/L — ABNORMAL HIGH (ref 0–44)
AST: 661 U/L — ABNORMAL HIGH (ref 15–41)
Albumin: 2.3 g/dL — ABNORMAL LOW (ref 3.5–5.0)
Alkaline Phosphatase: 90 U/L (ref 38–126)
Anion gap: 8 (ref 5–15)
BUN: 20 mg/dL (ref 6–20)
CO2: 25 mmol/L (ref 22–32)
Calcium: 7.6 mg/dL — ABNORMAL LOW (ref 8.9–10.3)
Chloride: 97 mmol/L — ABNORMAL LOW (ref 98–111)
Creatinine, Ser: 2.46 mg/dL — ABNORMAL HIGH (ref 0.44–1.00)
GFR, Estimated: 23 mL/min — ABNORMAL LOW (ref >60)
Glucose, Bld: 130 mg/dL — ABNORMAL HIGH (ref 70–99)
Potassium: 4.8 mmol/L (ref 3.5–5.1)
Sodium: 130 mmol/L — ABNORMAL LOW (ref 135–145)
Total Bilirubin: 0.8 mg/dL (ref 0.3–1.2)
Total Protein: 6.3 g/dL — ABNORMAL LOW (ref 6.5–8.1)

## 2021-02-12 LAB — APTT: aPTT: 39 seconds — ABNORMAL HIGH (ref 24–36)

## 2021-02-12 LAB — MAGNESIUM: Magnesium: 2.4 mg/dL (ref 1.7–2.4)

## 2021-02-12 LAB — PROTIME-INR
INR: 1 (ref 0.8–1.2)
Prothrombin Time: 12.7 seconds (ref 11.4–15.2)

## 2021-02-12 IMAGING — CT CT T SPINE W/O CM
3 of 4 series · 9 of 33 positions shown, 10 images · non-contrast
Comparison: None.

CLINICAL DATA: Possible IV drug use. Left leg weakness and back
pain.

EXAM:
CT THORACIC SPINE WITHOUT CONTRAST
TECHNIQUE: Multidetector CT images of the thoracic were obtained using the
standard protocol without intravenous contrast.

[Series 6: sagittal bone · sagittal · 0.34mm/px · 5 of 61 slices shown]
[im 21/61  bone]
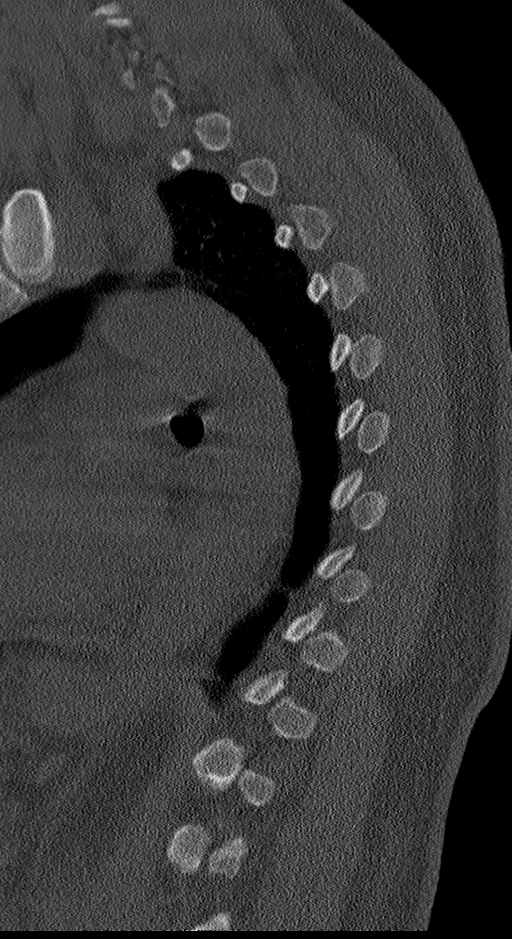
[im 26/61  bone]
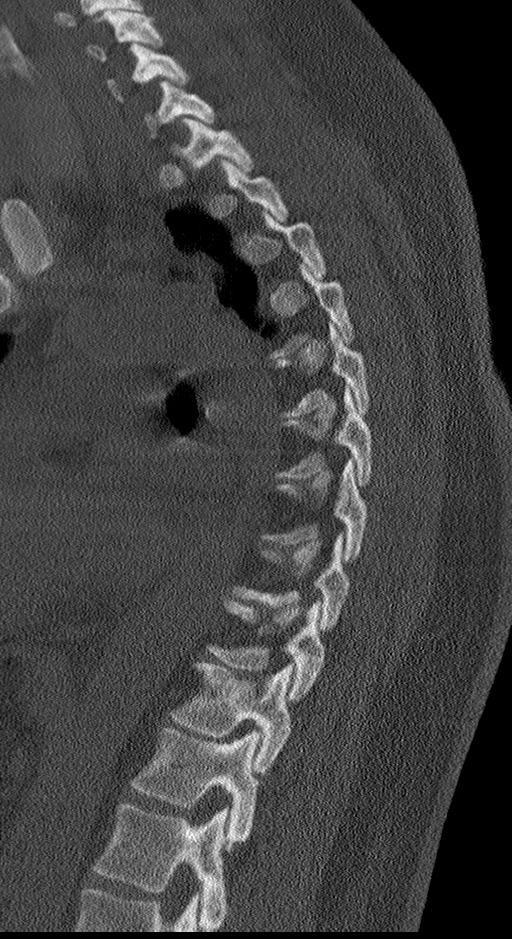
[im 31/61  bone]
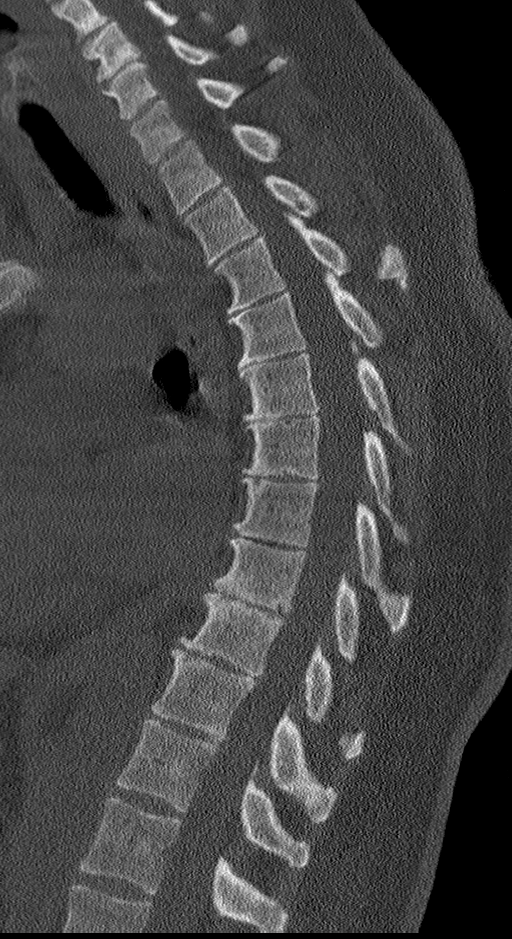
[im 36/61  bone]
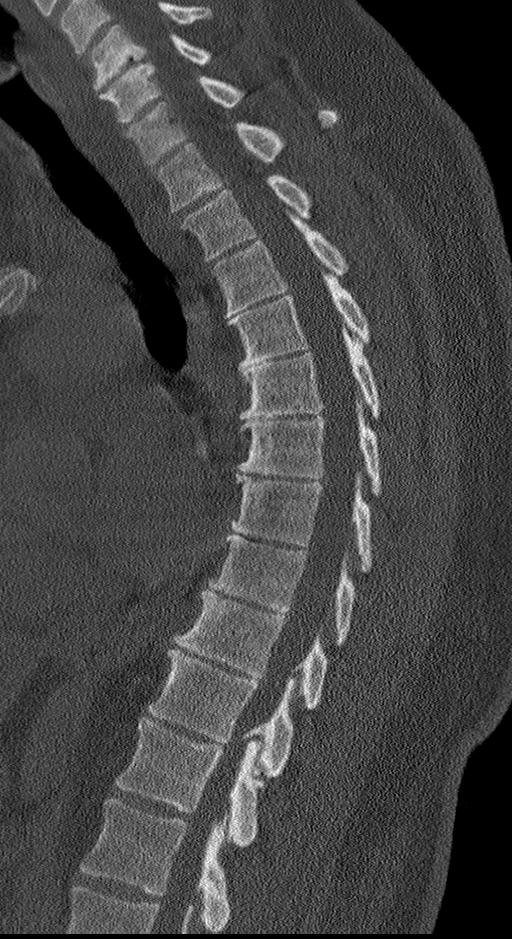
[im 41/61  bone]
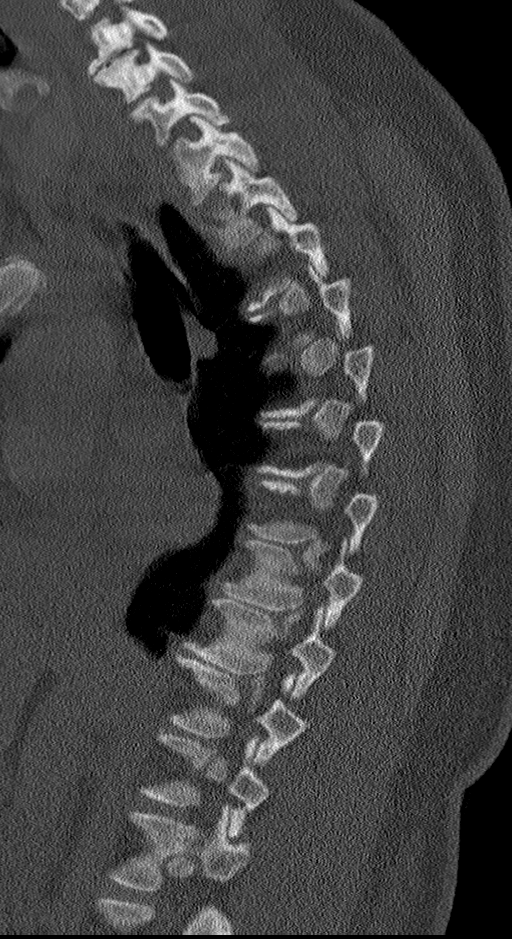

[Series 7: coronal bone · coronal · 0.23mm/px · 3 of 88 slices shown]
[im 18/88  bone]
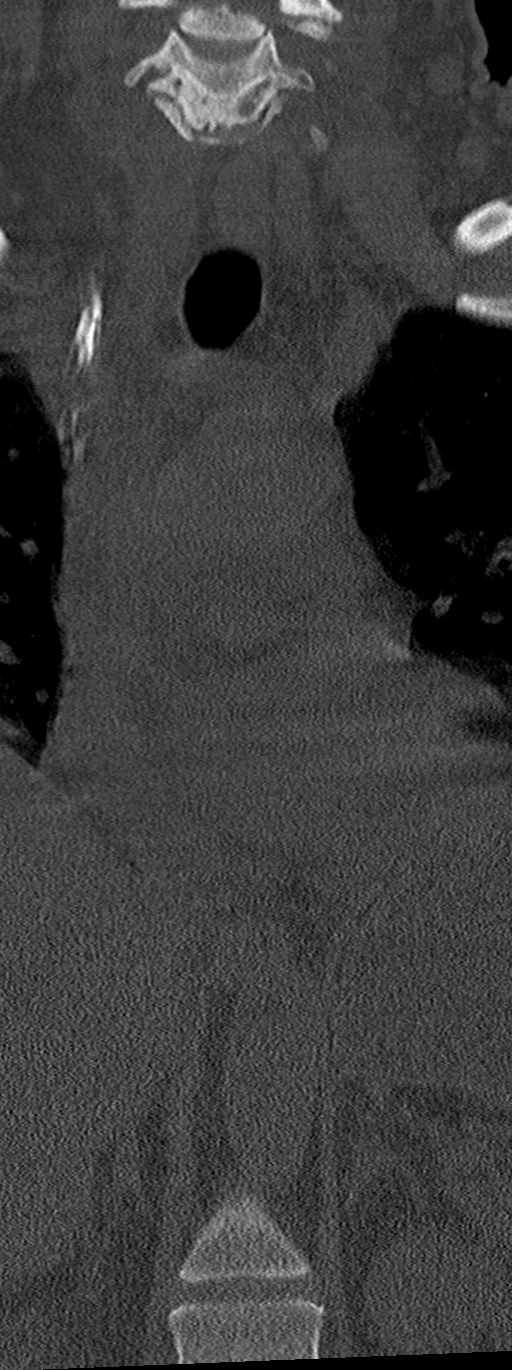
[im 35/88  bone]
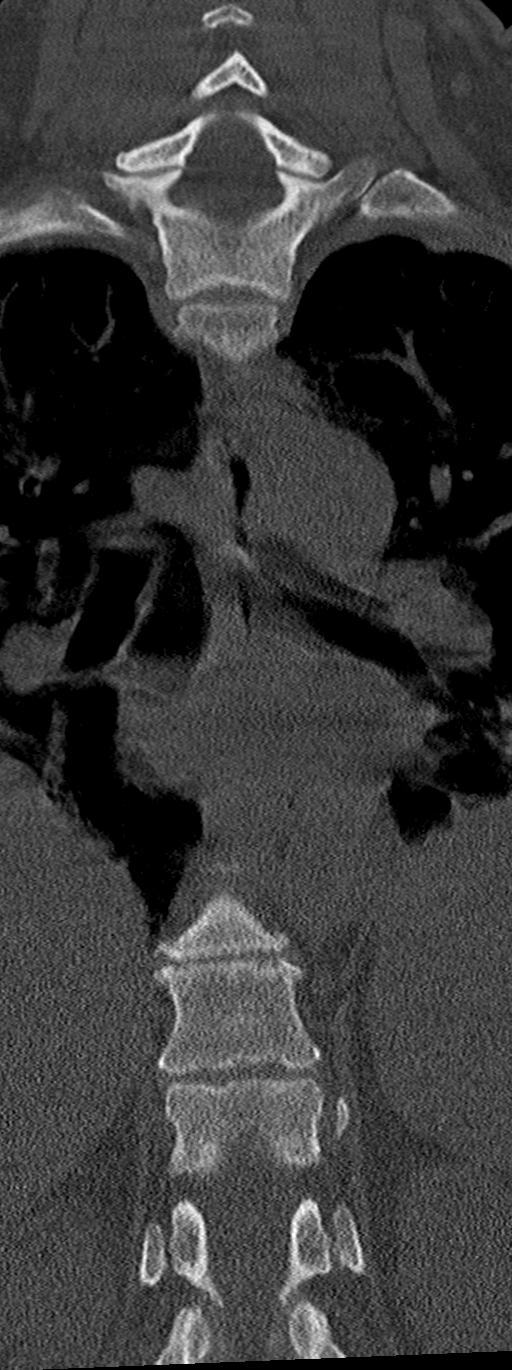
[im 53/88  bone]
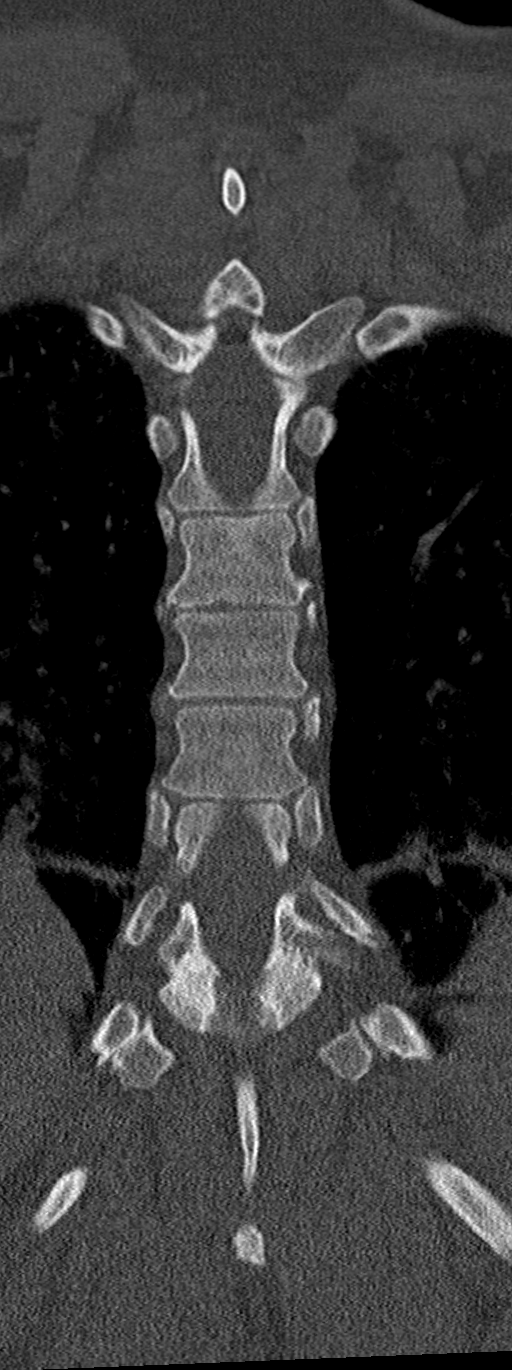

[Series 8: orthongonal axials · axial · 0.23mm/px · z∈[+1675,+1675]mm · 1 of 161 slices shown, 2 images]
[im 81/161  soft-tissue]
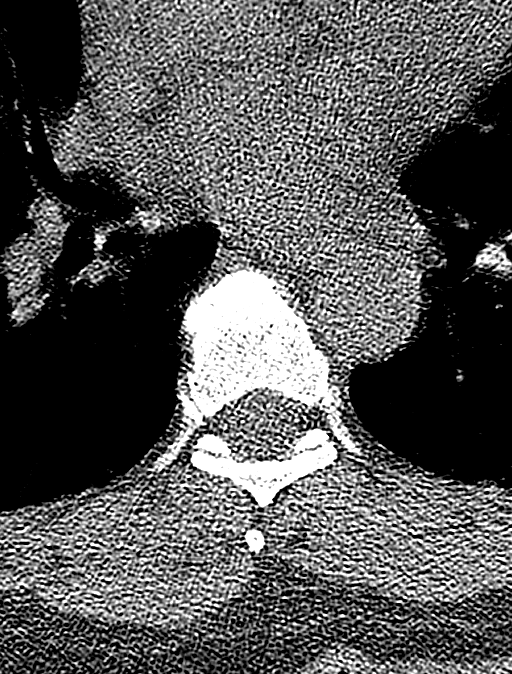
[im 81/161  bone]
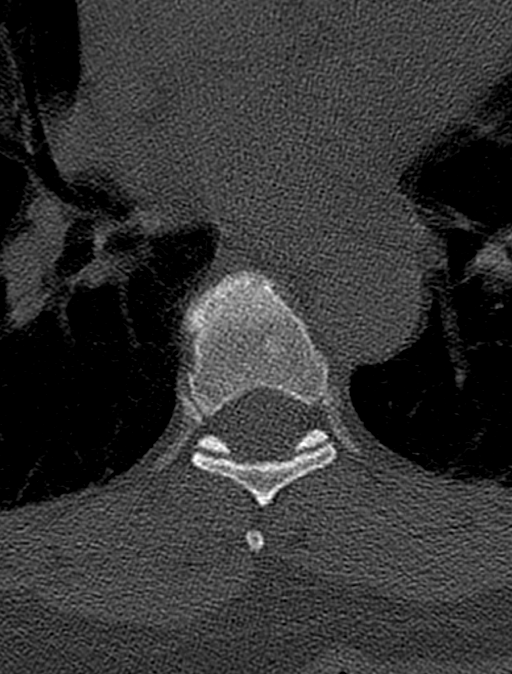

[9 of 33 positions shown; findings below may reference images not displayed]

FINDINGS: Alignment: Increased kyphotic curvature.

Vertebrae: No fracture or primary bone lesion. No finding to suggest
bone infection or disc space infection.

Paraspinal and other soft tissues: Negative other than atelectasis
or scarring in the lower lobes.

Disc levels: Chronic disc space narrowing from T3-4 through T9-10.
Small endplate osteophytes. No apparent bony stenosis of the canal
or foramina. Ordinary mild facet osteoarthritis. No finding to
suggest regional infection.
IMPRESSION: Increased kyphotic curvature. Chronic degenerative disc disease from
T3-4 through T9-10. No apparent bony stenosis of the canal or
foramina. No finding to suggest bone infection or disc space
infection.

## 2021-02-12 IMAGING — DX DG CHEST 1V PORT
1 series · 1 of 1 positions shown · non-contrast
Comparison: Prior chest radiograph [DATE].

CLINICAL DATA: Central line complication. Concern for location of
central line.

EXAM:
PORTABLE CHEST 1 VIEW

[chest ap]
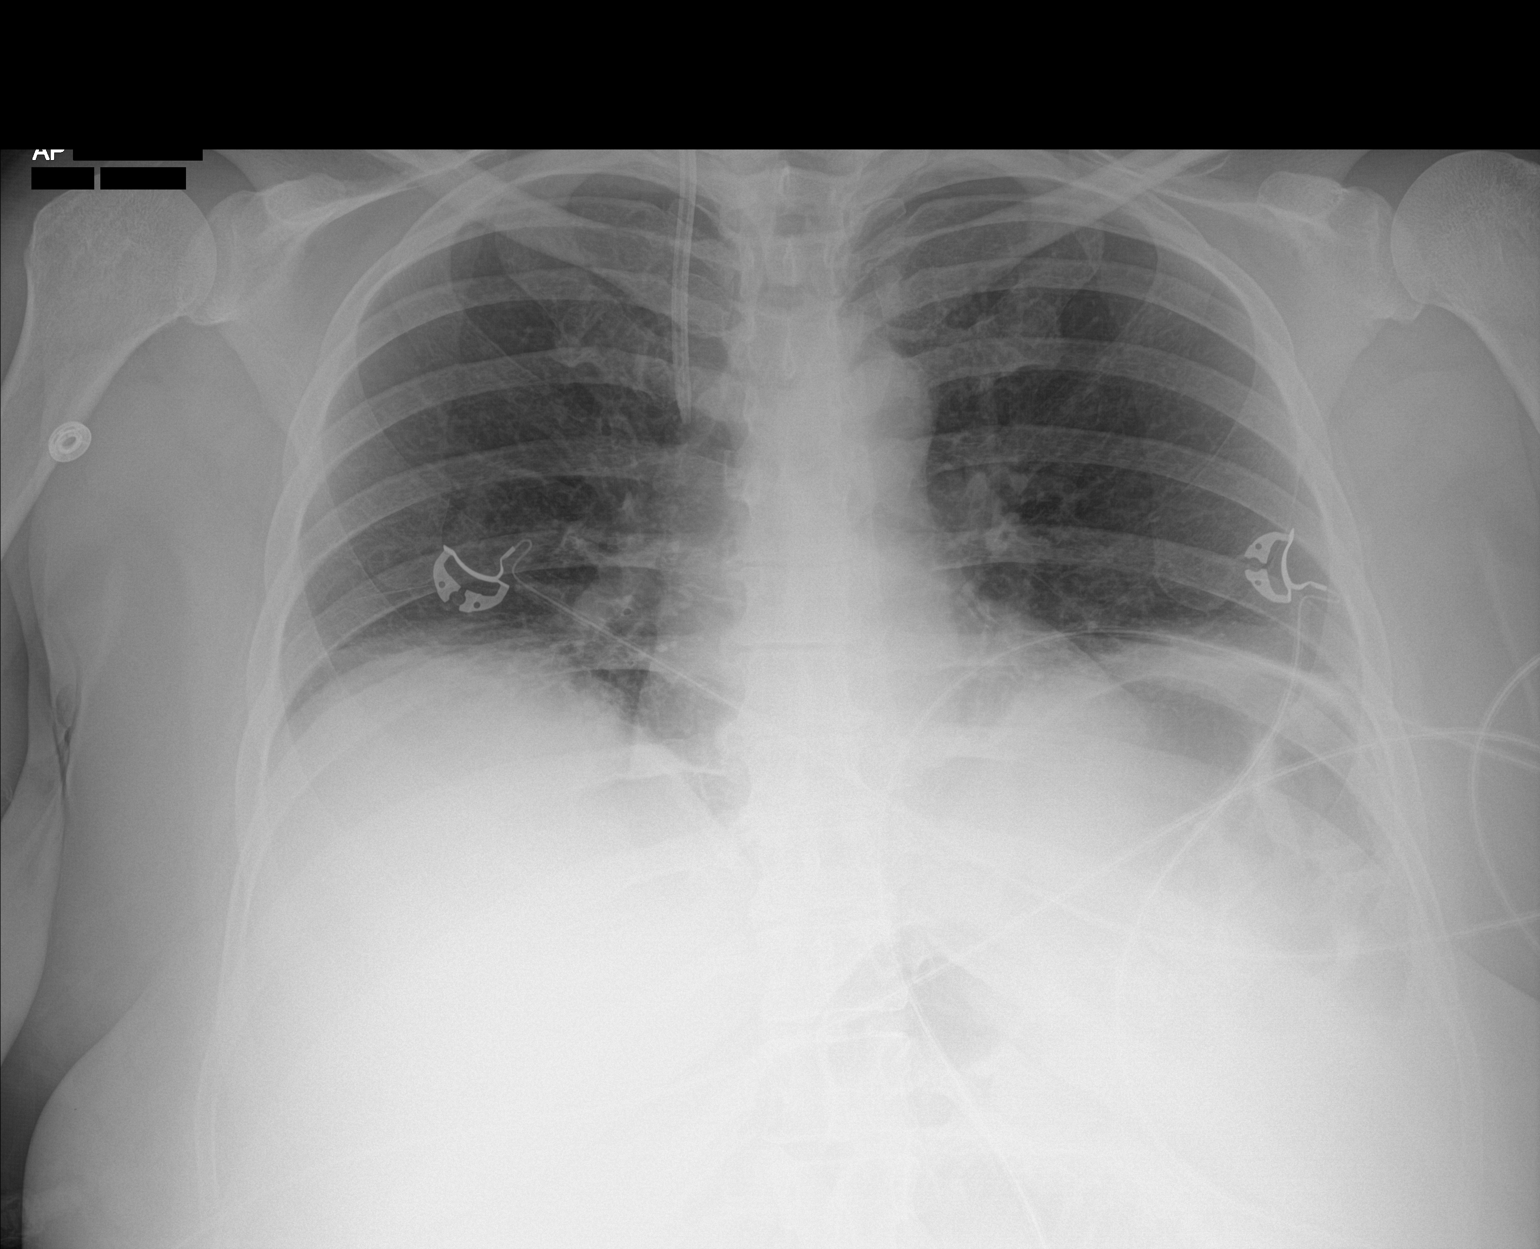

[1 of 1 positions shown; findings below may reference images not displayed]

FINDINGS: Right IJ approach central venous catheter with tip projecting in the
region of the mid to upper SVC.

Shallow inspiration radiograph. Heart size within normal limits. As
before, there is subsegmental atelectasis within both lung bases. No
appreciable pleural effusion or evidence of pneumothorax. No acute
bony abnormality identified.
IMPRESSION: Right IJ approach central venous catheter with tip projecting in the
region of the mid to upper SVC.

Shallow inspiration radiograph. Redemonstrated bibasilar
subsegmental atelectasis.

## 2021-02-12 IMAGING — CT CT L SPINE W/O CM
3 of 5 series · 14 of 33 positions shown, 16 images · non-contrast
Comparison: None.

CLINICAL DATA: Possible IV drug use. Back pain and left leg
numbness.

EXAM:
CT LUMBAR SPINE WITHOUT CONTRAST
TECHNIQUE: Multidetector CT imaging of the lumbar spine was performed without
intravenous contrast administration. Multiplanar CT image
reconstructions were also generated.

[Series 4: l spine soft · axial · 0.38mm/px · z∈[+1340,+1534]mm · 8 of 115 slices shown, 10 images]
[im 9/115  soft-tissue]
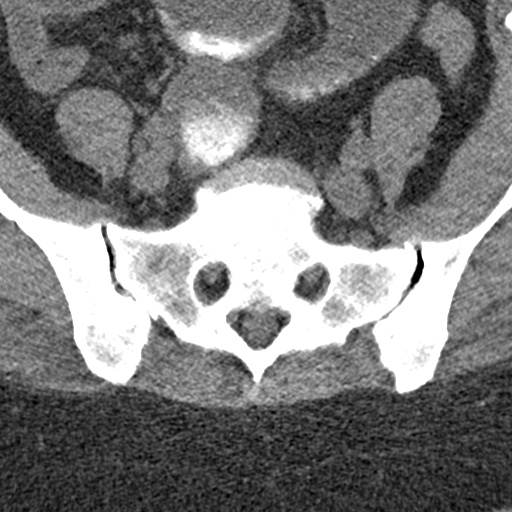
[im 9/115  bone]
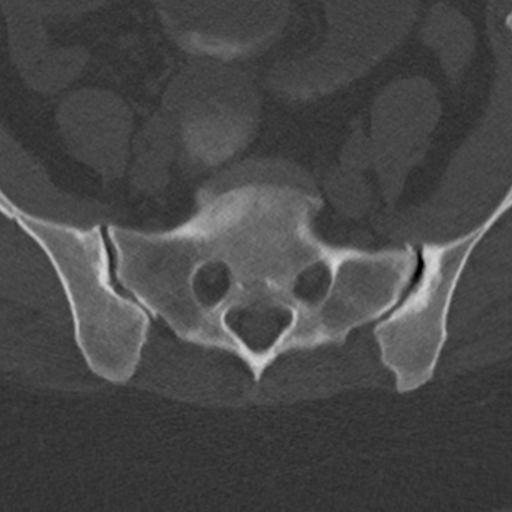
[im 27/115  bone]
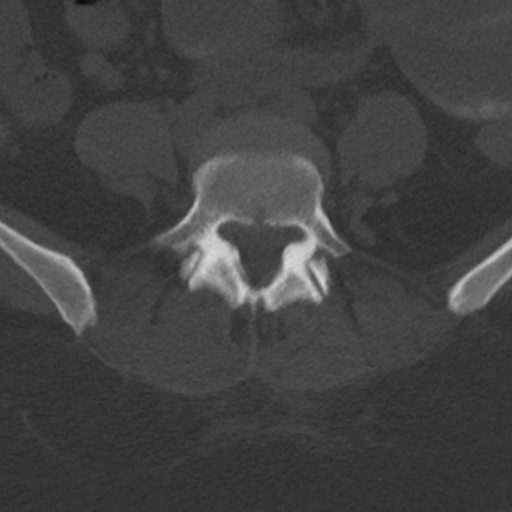
[im 36/115  bone]
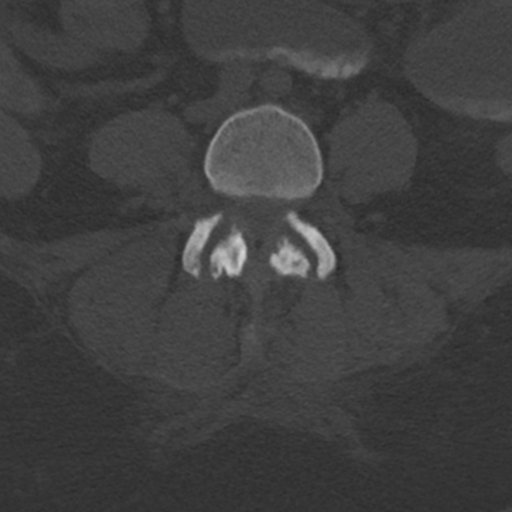
[im 53/115  bone]
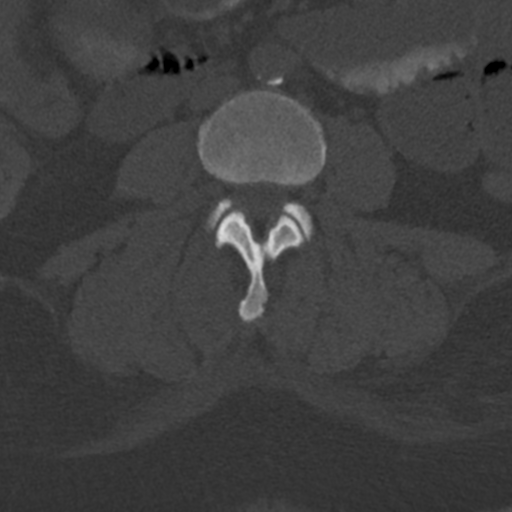
[im 62/115  soft-tissue]
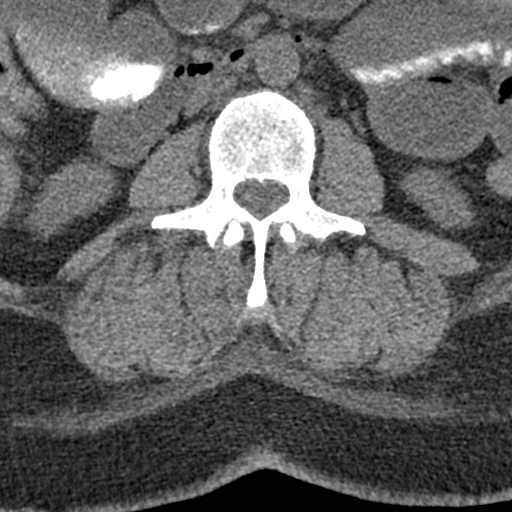
[im 62/115  bone]
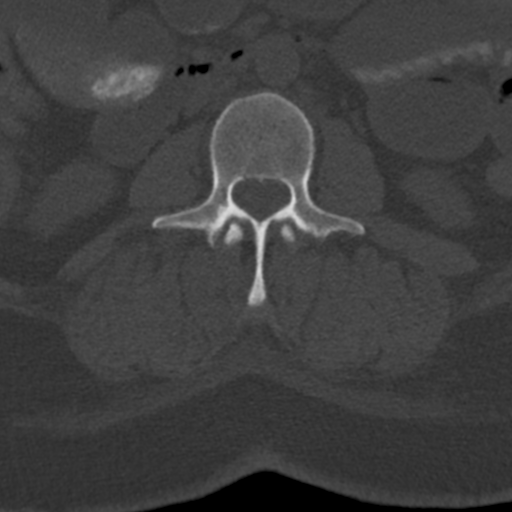
[im 79/115  bone]
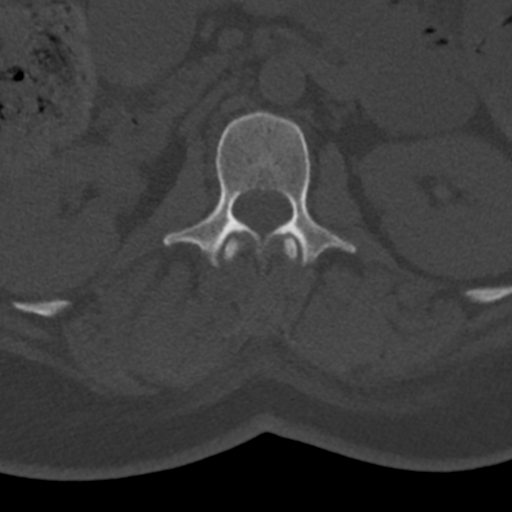
[im 88/115  bone]
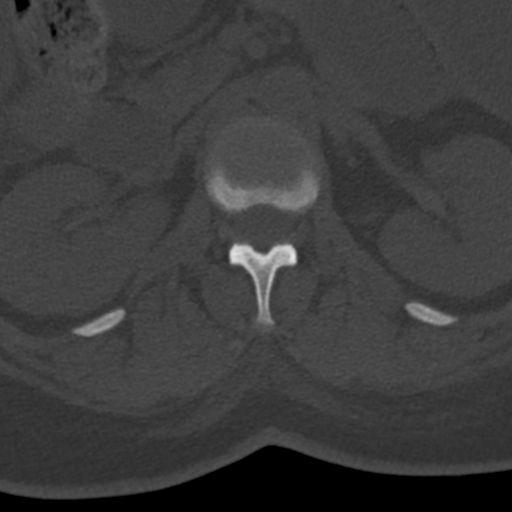
[im 106/115  bone]
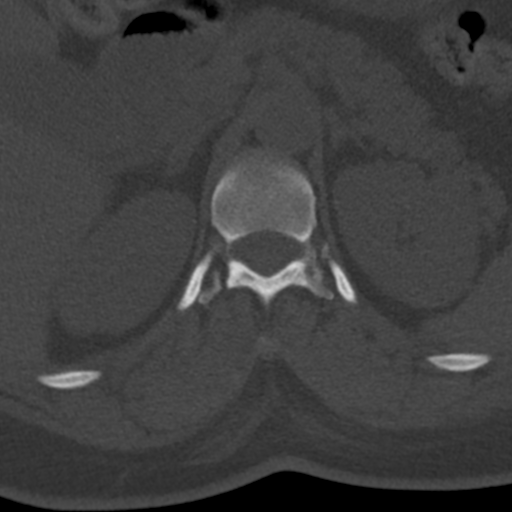

[Series 6: sagittal bone · sagittal · 0.34mm/px · 5 of 61 slices shown]
[im 11/61  bone]
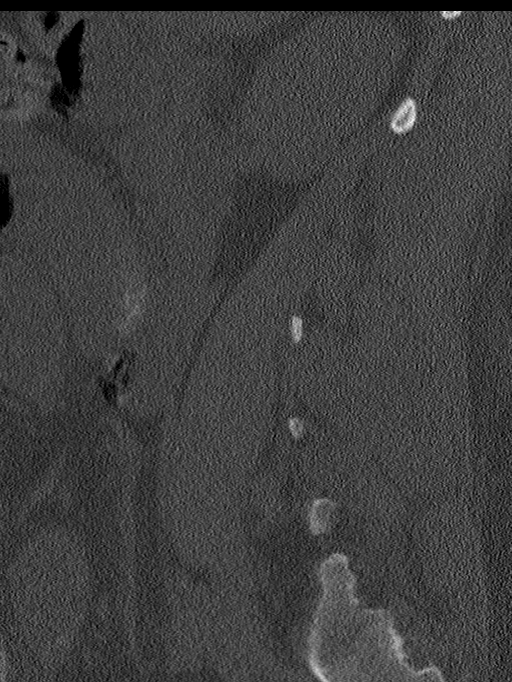
[im 21/61  bone]
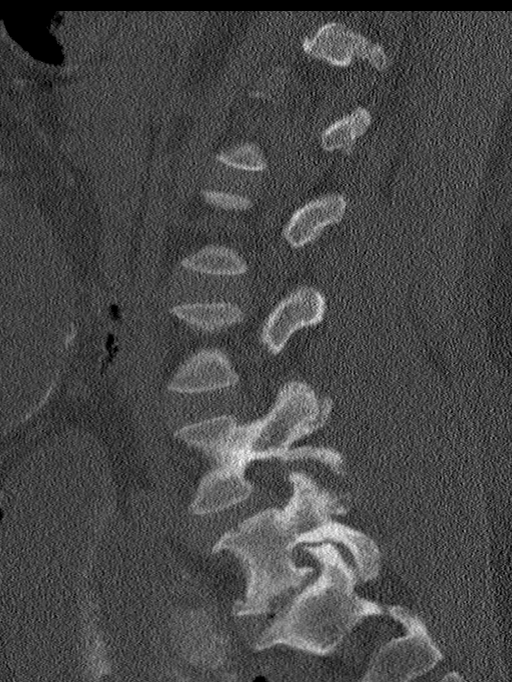
[im 31/61  bone]
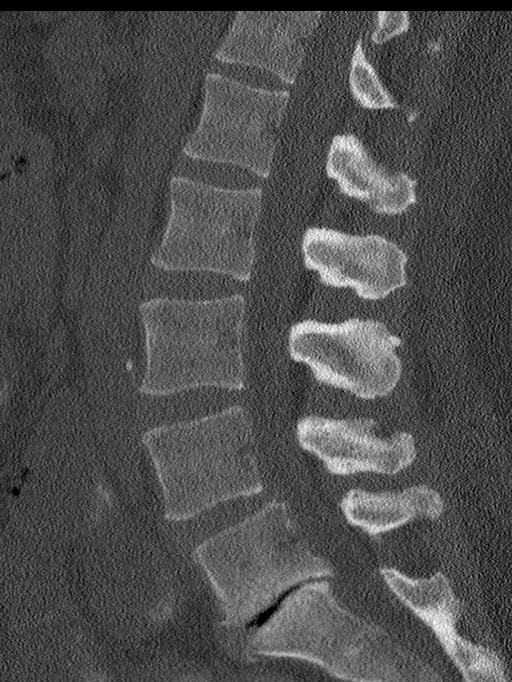
[im 41/61  bone]
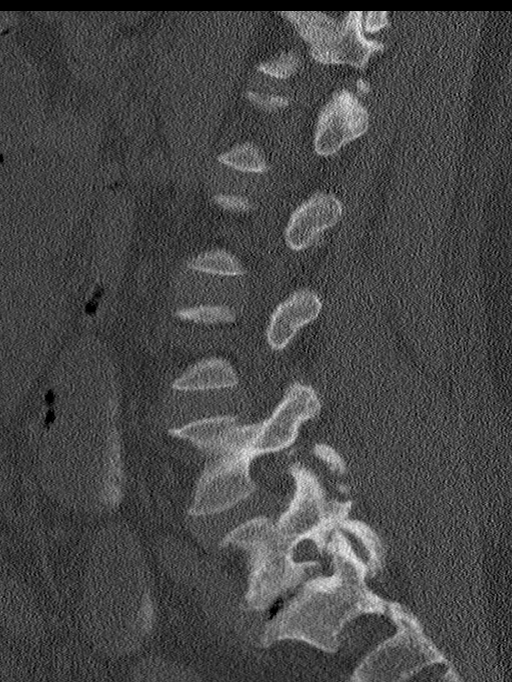
[im 51/61  bone]
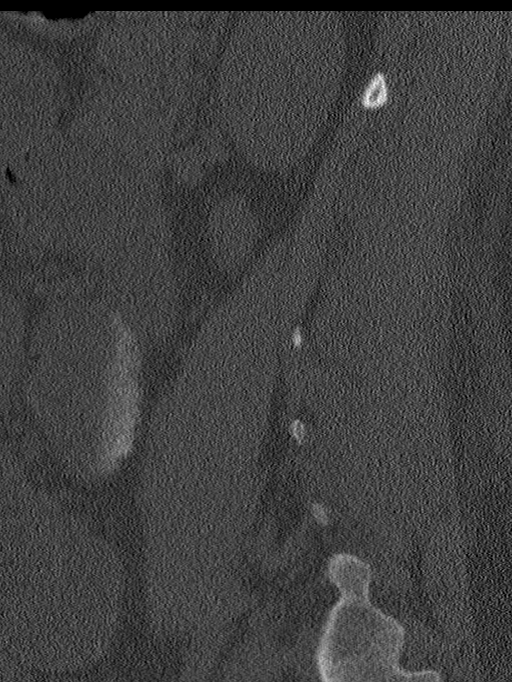

[Series 7: coronal bone · coronal · 0.23mm/px · 1 of 87 slices shown]
[im 44/87  bone]
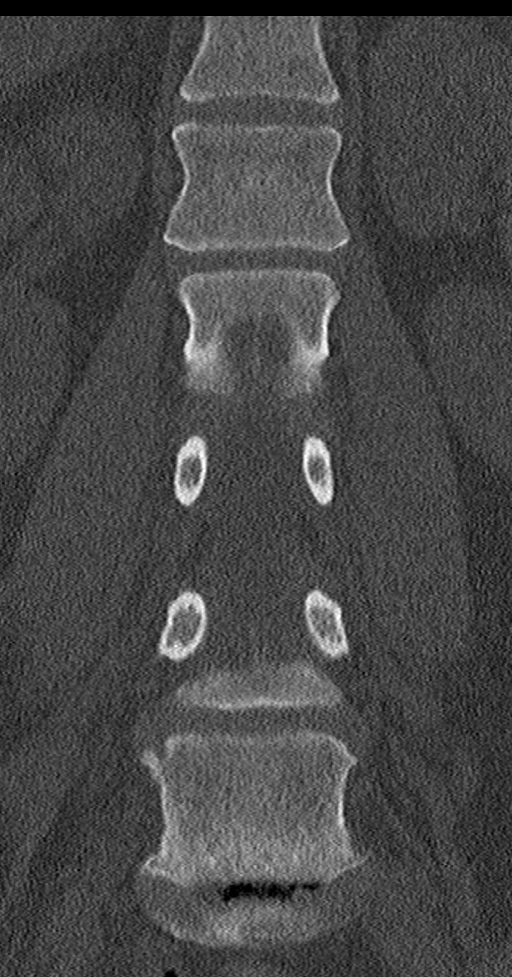

[14 of 33 positions shown; findings below may reference images not displayed]

FINDINGS: Segmentation: 5 lumbar type vertebral bodies.

Alignment: 2 mm degenerative anterolisthesis L4-5.

Vertebrae: No fracture or focal bone lesion. Chronic discogenic
endplate sclerotic changes at L5-S1. No bone finding to suggest
infection.

Paraspinal and other soft tissues: Negative

Disc levels: No significant finding from T12-L1 through L3-4.

L4-5: Advanced bilateral facet arthropathy with gaping joints. 2 mm
of anterolisthesis, which would likely worsen with standing or
flexion. Bulging of the disc. Multifactorial stenosis that would
likely worsen with standing or flexion. Neural compression could
occur at this level.

L5-S1: Chronic disc degeneration with loss of disc height. Endplate
osteophytes and bulging of the disc. Vacuum phenomenon. Chronic
endplate sclerosis without evidence of erosion. Bilateral foraminal
stenosis could affect either L5 nerve.

Bilateral sacroiliac osteoarthritis which could be painful.
IMPRESSION: 1. L4-5: Advanced bilateral facet arthropathy with gaping joints. 2
mm of anterolisthesis, which would likely worsen with standing or
flexion. Bulging of the disc. Multifactorial stenosis that would
likely worsen with standing or flexion. Neural compression could
occur at this level.
2. L5-S1: Chronic disc degeneration with loss of disc height.
Endplate osteophytes and bulging of the disc. Bilateral foraminal
stenosis could affect either L5 nerve.
3. Bilateral sacroiliac osteoarthritis which could be painful.
4. No finding to suggest regional spinal infection.

## 2021-02-12 IMAGING — DX DG ABDOMEN 1V
1 series · 1 of 1 positions shown · non-contrast
Comparison: CT [DATE]

CLINICAL DATA: NG tube placement

EXAM:
ABDOMEN - 1 VIEW

[abdomen kub]
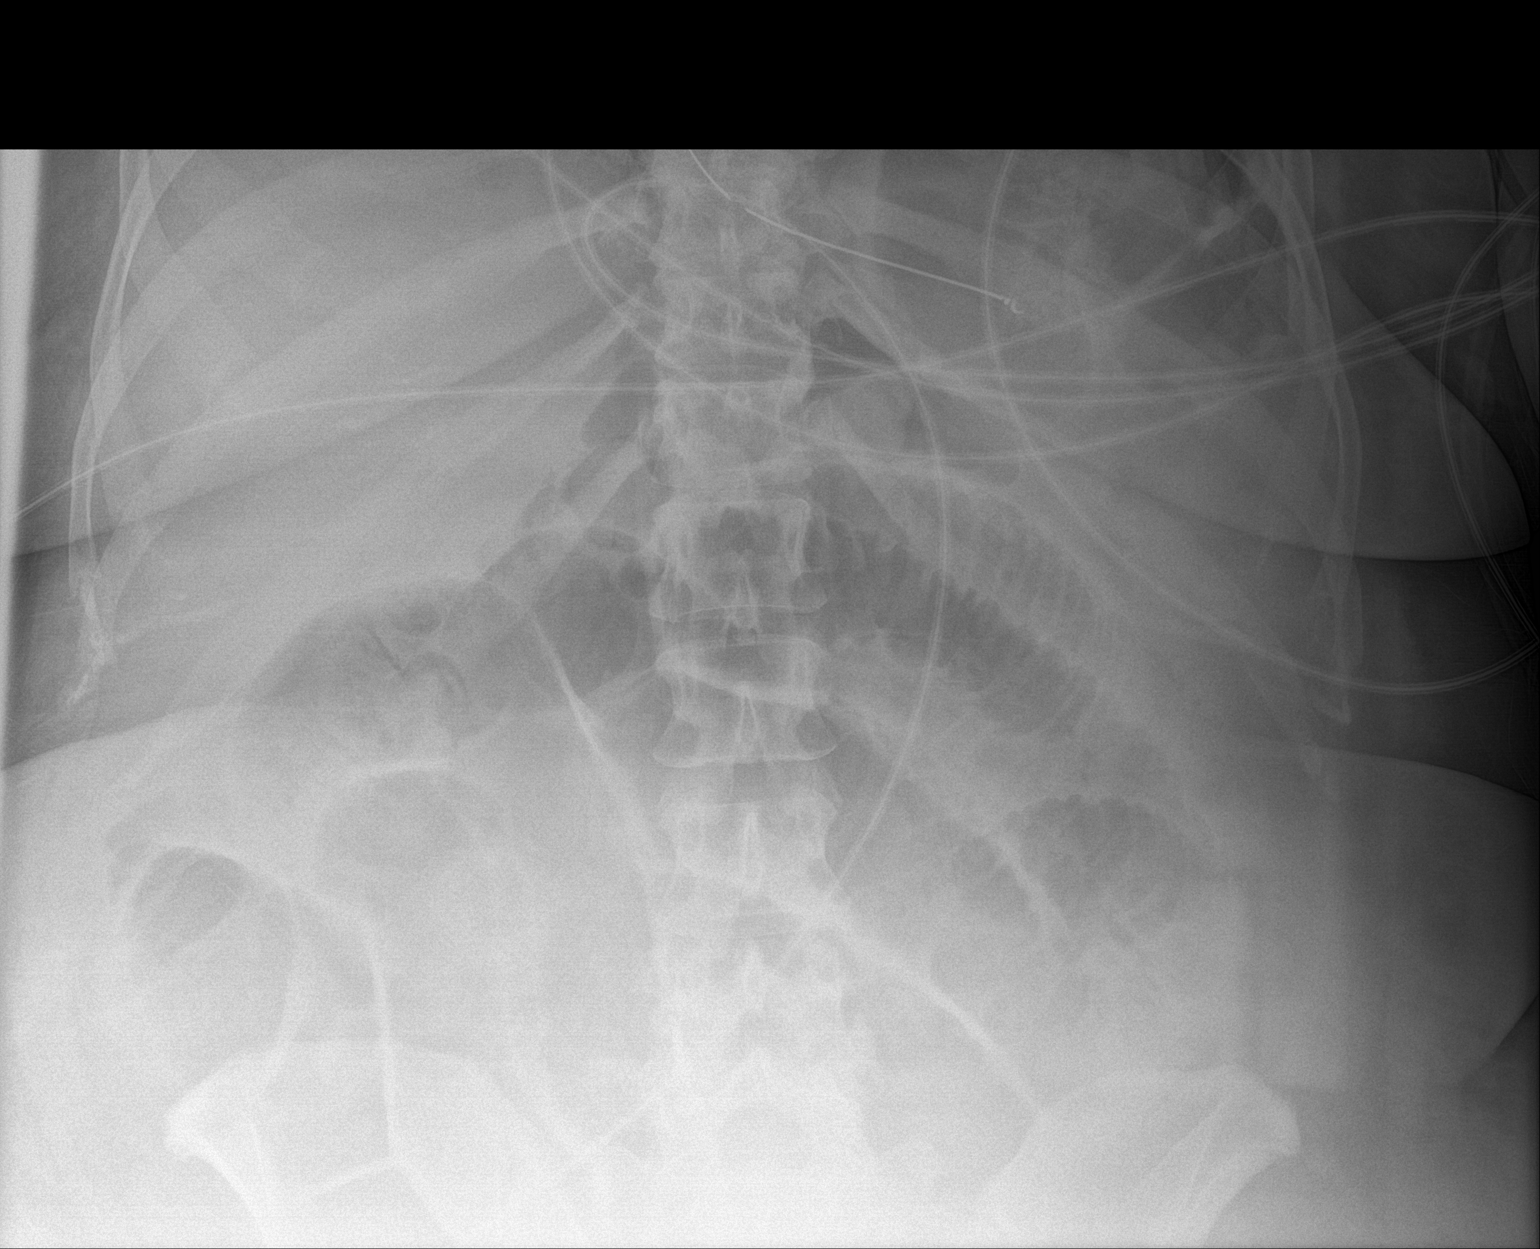

[1 of 1 positions shown; findings below may reference images not displayed]

FINDINGS: NG tube tip is in the proximal stomach with the side port in the
distal esophagus. Dilated small bowel loops compatible with small
bowel obstruction.
IMPRESSION: NG tube tip in the proximal stomach.

## 2021-02-12 IMAGING — CT CT ABD-PELV W/O CM
2 of 4 series · 16 of 46 positions shown, 18 images · non-contrast
Comparison: Abdominal ultrasound dated [DATE] and CT abdomen
pelvis dated [DATE].

CLINICAL DATA: Low back pain, abdominal pain, acute renal failure.

EXAM:
CT ABDOMEN AND PELVIS WITHOUT CONTRAST
TECHNIQUE: Multidetector CT imaging of the abdomen and pelvis was performed
following the standard protocol without IV contrast.

[Series 2: axial st · axial · 0.98mm/px · z∈[+1099,+1609]mm · 13 of 114 slices shown, 15 images]
[im 6/114  soft-tissue]
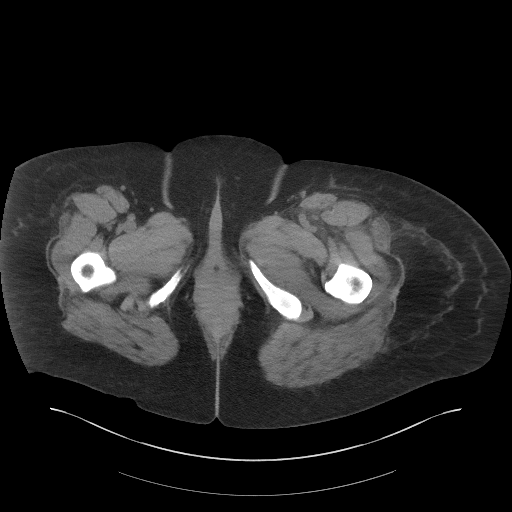
[im 6/114  bone]
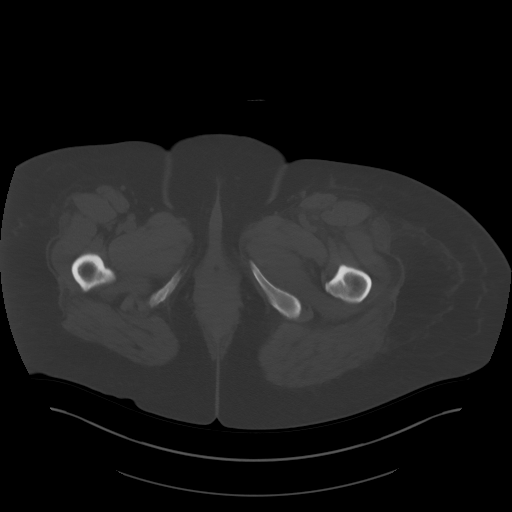
[im 18/114  soft-tissue]
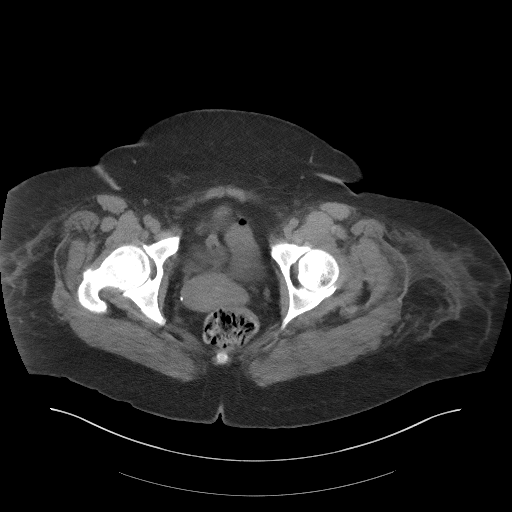
[im 24/114  soft-tissue]
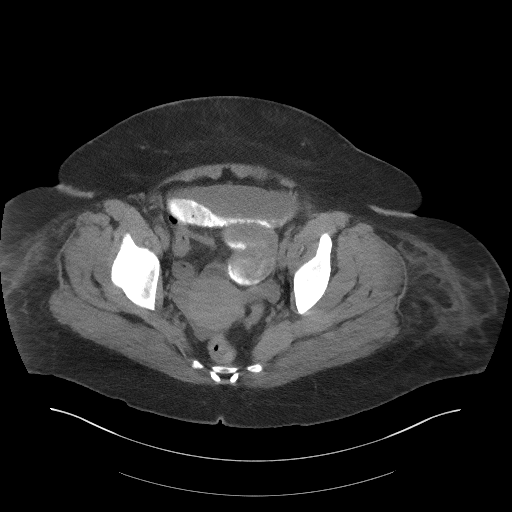
[im 30/114  soft-tissue]
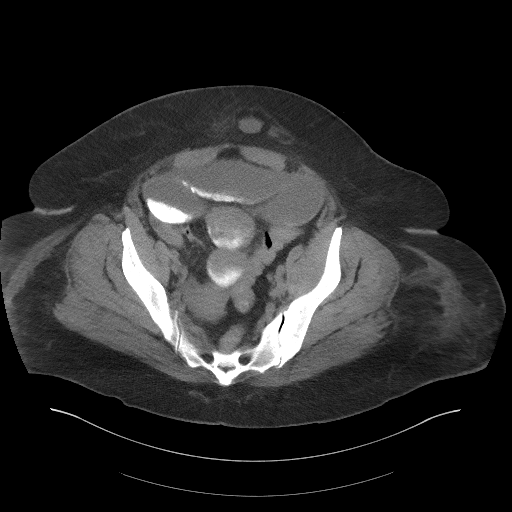
[im 42/114  soft-tissue]
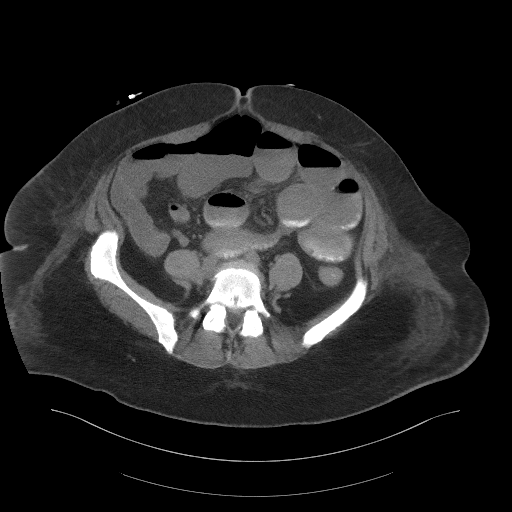
[im 48/114  soft-tissue]
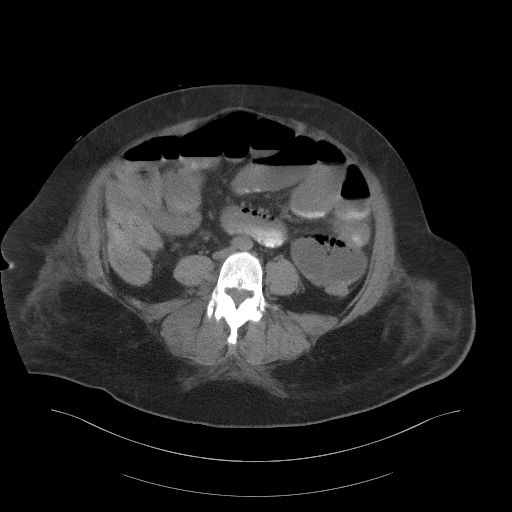
[im 60/114  soft-tissue]
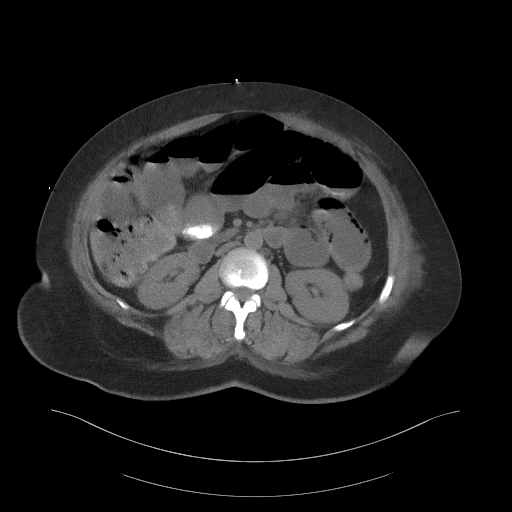
[im 66/114  soft-tissue]
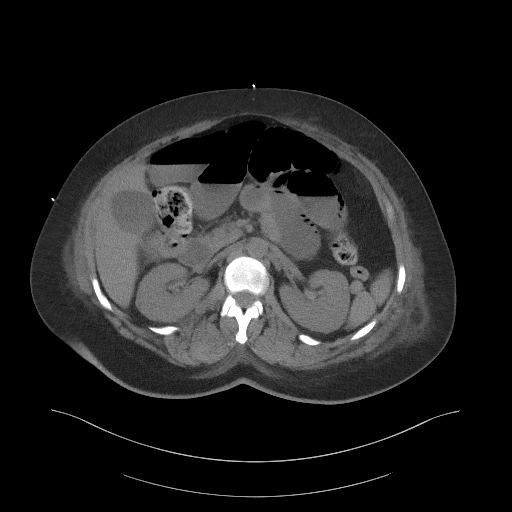
[im 72/114  soft-tissue]
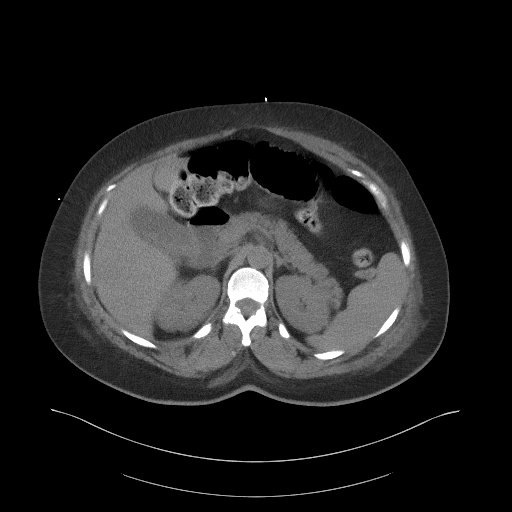
[im 72/114  bone]
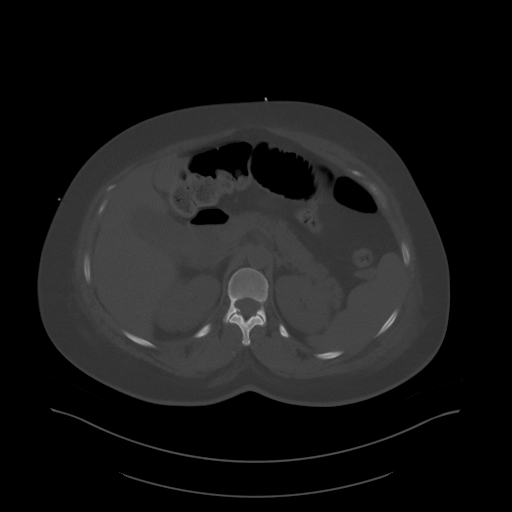
[im 84/114  soft-tissue]
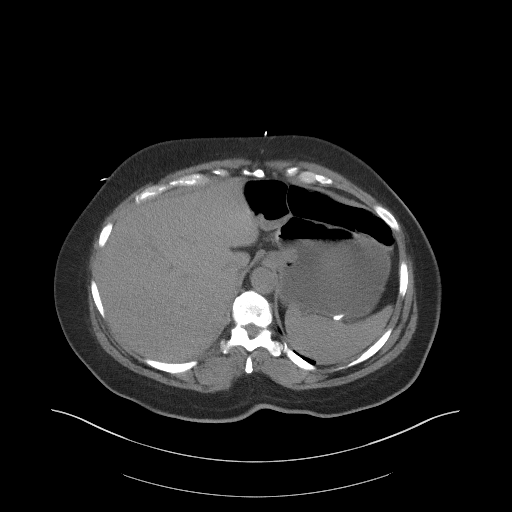
[im 90/114  soft-tissue]
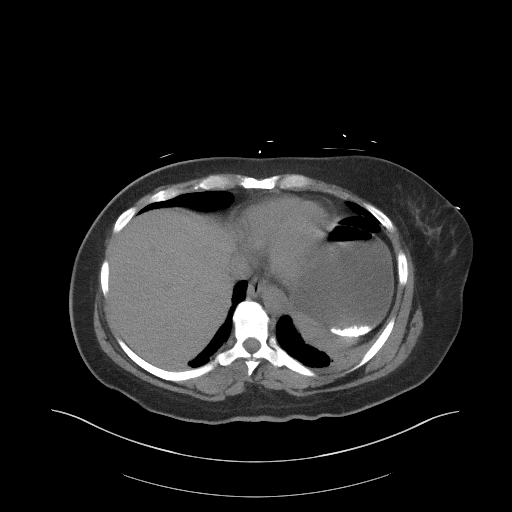
[im 96/114  soft-tissue]
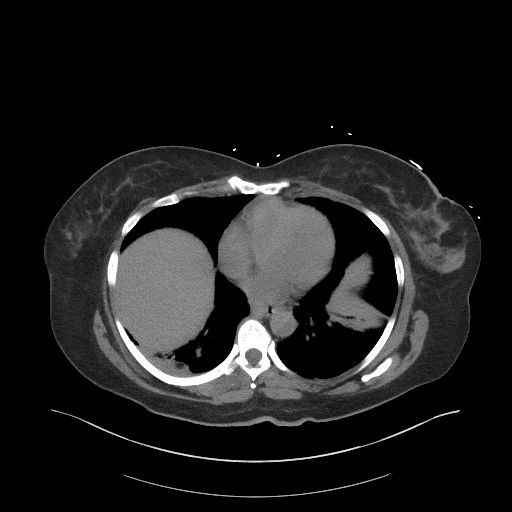
[im 108/114  soft-tissue]
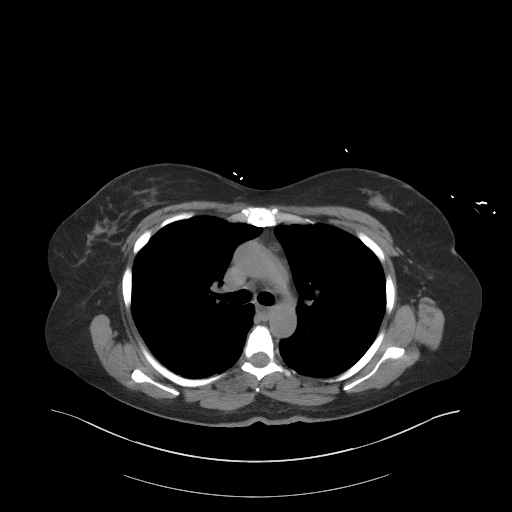

[Series 6: coronal st · coronal · 0.94mm/px · 3 of 113 slices shown]
[im 38/113  soft-tissue]
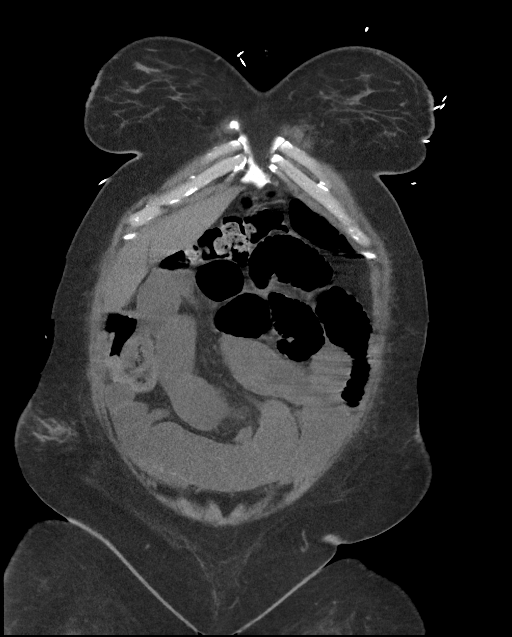
[im 50/113  soft-tissue]
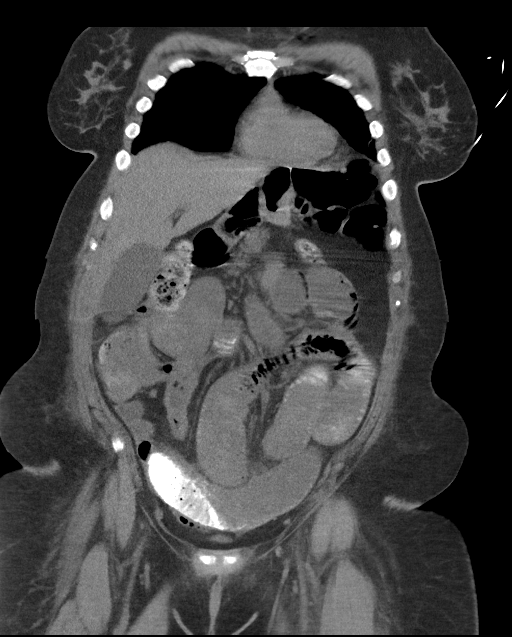
[im 63/113  soft-tissue]
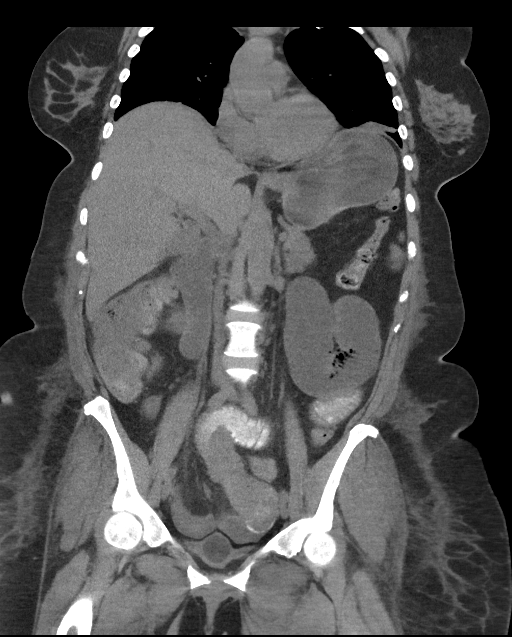

[16 of 46 positions shown; findings below may reference images not displayed]

FINDINGS: Lower chest: There is a moderate bibasilar atelectasis.

Hepatobiliary: No focal liver abnormality is seen. No gallstones,
gallbladder wall thickening, or biliary dilatation.

Pancreas: Unremarkable. No pancreatic ductal dilatation or
surrounding inflammatory changes.

Spleen: Normal in size without focal abnormality.

Adrenals/Urinary Tract: Adrenal glands are unremarkable. Kidneys
appear normal on this non contrast exam, without renal calculi,
focal lesion, or hydronephrosis. A Foley catheter is in place.

Stomach/Bowel: Stomach is within normal limits. There is a
high-grade small bowel obstruction with a transition point to
decompressed distal small bowel within an infraumbilical ventral
abdominal wall hernia containing a loop of small bowel. Multiple
dilated loops of small bowel with air-fluid levels and fecalization
of bowel contents are seen proximal to the obstruction. The appendix
appears normal. No evidence of bowel inflammatory changes.

Vascular/Lymphatic: Aortic atherosclerosis. No enlarged abdominal or
pelvic lymph nodes.

Reproductive: Uterus and bilateral adnexa are unremarkable.

Other: Trace free fluid is seen in the pelvis.

Musculoskeletal: Degenerative changes are seen at L5-S1.
IMPRESSION: 1. High-grade small bowel obstruction with a transition point within
an infraumbilical ventral abdominal wall hernia.

Aortic Atherosclerosis ([H8]-[H8]).

These results were called by telephone at the time of interpretation
on [DATE] at [DATE] to provider LITTON , who verbally
acknowledged these results.

## 2021-02-12 MED ORDER — TRAZODONE HCL 50 MG PO TABS
150.0000 mg | ORAL_TABLET | Freq: Every day | ORAL | Status: DC
  Administered 2021-02-12 – 2021-03-06 (×23): 150 mg via ORAL
  Filled 2021-02-12 (×23): qty 3

## 2021-02-12 MED ORDER — SERTRALINE HCL 50 MG PO TABS
50.0000 mg | ORAL_TABLET | Freq: Every day | ORAL | Status: DC
  Administered 2021-02-12 – 2021-02-25 (×14): 50 mg via ORAL
  Filled 2021-02-12 (×15): qty 1

## 2021-02-12 MED ORDER — FAMOTIDINE 20 MG PO TABS
20.0000 mg | ORAL_TABLET | Freq: Every day | ORAL | Status: DC
  Administered 2021-02-12 – 2021-02-17 (×6): 20 mg
  Filled 2021-02-12 (×6): qty 1

## 2021-02-12 MED ORDER — ALPRAZOLAM 0.5 MG PO TABS
0.5000 mg | ORAL_TABLET | Freq: Three times a day (TID) | ORAL | Status: DC | PRN
  Administered 2021-02-12: 0.5 mg via ORAL
  Filled 2021-02-12 (×2): qty 1

## 2021-02-12 MED ORDER — FAMOTIDINE 20 MG PO TABS: 20.0000 mg | ORAL_TABLET | Freq: Every day | ORAL | Status: DC

## 2021-02-12 MED ORDER — OLANZAPINE 5 MG PO TABS
5.0000 mg | ORAL_TABLET | Freq: Every day | ORAL | Status: DC
  Administered 2021-02-12: 5 mg via ORAL
  Filled 2021-02-12: qty 1

## 2021-02-12 NOTE — Progress Notes (Signed)
[facility]  HD catheter has come out more, now not able to run CRRT Slight urine output overnight Hemodynamics OK Start gentle fluids given bowel obstruction Hold CRRT, following labs, UOP May need to restart in AM and replace HD cath, but not unreasonable to give a holiday overnight and monitor   [name], MD [facility] Pager: [phone] Cell: [phone] If no response, please call [phone] until 7pm After 7:00 pm call Elink  [phone]

## 2021-02-12 NOTE — Progress Notes (Signed)
Inpatient Rehab Admissions Coordinator Note:   Per PT recommendation, pt was screened for CIR candidacy by Gayland Curry.  At this time we are recommending an inpatient rehab consult. Please place an IP Rehab MD consult order if pt would like to be considered.   Please contact me with questions.    Gayland Curry, [place], [place] Admissions Coordinator [phone] 02/12/21 4:22 PM

## 2021-02-12 NOTE — Progress Notes (Signed)
Renee Wilkinson Progress Note    Assessment/ Plan:    1.Renal-  Acute Kidney Injury with CK 50 K, CTA abdomen showed likely extensive bilateral renal infarcts.  On CRRT, 4K dialysate, heparin at 500 u/ hr flat rate.  Starting to make a little urine now.   Increasing net neg fluid removal.  I think that if we can get good UF today and overnight she will be able to come off CRRT tomorrow 3/20 and convert to IHD.  I am anticipating she will need IHD for at least several sessions which will necessitate a transfer to [facility].     2 Hypertension/volume: change to net negative 50-100 mL/ hr  3. Hyperkalemia   Resolving with CRRT  4. Metabolic acidosis  Improved  5. Rhabdomyolysis: was down for > 6 hrs  6.  Abd pain: RUQ US showing gallbladder sludge.  Per primary  Ct abd/ pelvis today to evaluate ongoing abd pain and leg numbess  7. Substance abuse: her husband is deceased following the incident.  Spiritual care is involved and psychiatry has been consulted, greatly appreciate.   8.  Shock: resolved, off levophed, troponins peaked at > 4000, TTE with EF 60-65% and no WMA, RV OK  9.  Dispo: remains in ICU.  Subjective:    Off pressors.  HD cath changed over wire yesterday.  CT abd/ pelvis ordered for ongoing abd pain.  APTTs remain appropriate on flat rate heparin gtt.     Objective:   BP 137/82   Pulse 94   Temp 98.5 F (36.9 C) (Axillary)   Resp 17   Ht '5\' 5"'$  (1.651 m)   Wt 108 kg   SpO2 96%   BMI 39.62 kg/m   Intake/Output Summary (Last 24 hours) at 02/12/2021 1114 Last data filed at 02/12/2021 1100 Gross per 24 hour  Intake 2343.59 ml  Output 2277 ml  Net 66.59 ml   Weight change:   Physical Exam: Gen: lying in bed, tearful and in pain CVS: RRR Resp: clear Abd: soft, nontender today Ext: 1+ LE edema ACCESS; R IJ nontunneled HD cath  Imaging: DG Chest 1 View  Result Date: 02/11/2021 CLINICAL DATA:  Central line placement EXAM: CHEST  1 VIEW COMPARISON:   02/09/2021 FINDINGS: Right internal jugular center venous catheter tip: Lower SVC. No pneumothorax. Low lung volumes. Subsegmental atelectasis along both hemidiaphragms. Cardiac and mediastinal margins appear normal. IMPRESSION: 1. Right internal jugular center venous catheter tip: Lower SVC. No pneumothorax. 2. Subsegmental atelectasis along both hemidiaphragms. Electronically Signed   By: Van Clines M.D.   On: 02/11/2021 15:02   ECHOCARDIOGRAM COMPLETE  Result Date: 02/11/2021    ECHOCARDIOGRAM REPORT   Patient Name:   Renee Wilkinson Date of Exam: 02/11/2021 Medical Rec #:  [id number]       Height:       65.0 in Accession #:    [id number]      Weight:       238.3 lb Date of Birth:  [birth year]       BSA:          2.131 m Patient Age:    50 years        BP:           108/46 mmHg Patient Gender: F               HR:           70 bpm. Exam Location:  Inpatient Procedure: 2D Echo, Cardiac Doppler,  Color Doppler and Intracardiac            Opacification Agent Indications:    Acute Iscemic Heart Disease I24.9  History:        Patient has no prior history of Echocardiogram examinations.  Sonographer:    Jonelle Sidle Dance Referring Phys: [place] Comments: No subcostal window. IMPRESSIONS  1. Left ventricular ejection fraction, by estimation, is 60 to 65%. The left ventricle has normal function. The left ventricle has no regional wall motion abnormalities. Left ventricular diastolic parameters were normal.  2. Right ventricular systolic function was not well visualized. The right ventricular size is not well visualized. Tricuspid regurgitation signal is inadequate for assessing PA pressure.  3. The mitral valve is normal in structure. No evidence of mitral valve regurgitation. No evidence of mitral stenosis.  4. The aortic valve is normal in structure. Aortic valve regurgitation is not visualized. No aortic stenosis is present.  5. The inferior vena cava is normal in size with greater than  50% respiratory variability, suggesting right atrial pressure of 3 mmHg. FINDINGS  Left Ventricle: Left ventricular ejection fraction, by estimation, is 60 to 65%. The left ventricle has normal function. The left ventricle has no regional wall motion abnormalities. Definity contrast agent was given IV to delineate the left ventricular  endocardial borders. The left ventricular internal cavity size was normal in size. There is no left ventricular hypertrophy. Left ventricular diastolic parameters were normal. Normal left ventricular filling pressure. Right Ventricle: The right ventricular size is not well visualized. Right vetricular wall thickness was not assessed. Right ventricular systolic function was not well visualized. Tricuspid regurgitation signal is inadequate for assessing PA pressure. Left Atrium: Left atrial size was normal in size. Right Atrium: Right atrial size was normal in size. Pericardium: There is no evidence of pericardial effusion. Mitral Valve: The mitral valve is normal in structure. No evidence of mitral valve regurgitation. No evidence of mitral valve stenosis. Tricuspid Valve: The tricuspid valve is normal in structure. Tricuspid valve regurgitation is trivial. No evidence of tricuspid stenosis. Aortic Valve: The aortic valve is normal in structure. Aortic valve regurgitation is not visualized. No aortic stenosis is present. Pulmonic Valve: The pulmonic valve was normal in structure. Pulmonic valve regurgitation is not visualized. No evidence of pulmonic stenosis. Aorta: The aortic root is normal in size and structure. Venous: The inferior vena cava was not well visualized. The inferior vena cava is normal in size with greater than 50% respiratory variability, suggesting right atrial pressure of 3 mmHg. IAS/Shunts: No atrial level shunt detected by color flow Doppler.  LEFT VENTRICLE PLAX 2D LVIDd:         4.30 cm  Diastology LVIDs:         3.00 cm  LV e' medial:    8.81 cm/s LV PW:          1.00 cm  LV E/e' medial:  9.5 LV IVS:        0.90 cm  LV e' lateral:   9.79 cm/s LVOT diam:     2.10 cm  LV E/e' lateral: 8.6 LV SV:         74 LV SV Index:   35 LVOT Area:     3.46 cm  LEFT ATRIUM             Index LA diam:        3.50 cm 1.64 cm/m LA Vol (A2C):   45.3 ml 21.26 ml/m LA  Vol (A4C):   30.5 ml 14.31 ml/m LA Biplane Vol: 40.4 ml 18.96 ml/m  AORTIC VALVE LVOT Vmax:   133.00 cm/s LVOT Vmean:  78.900 cm/s LVOT VTI:    0.215 m  AORTA Ao Root diam: 3.10 cm Ao Asc diam:  2.80 cm MITRAL VALVE MV Area (PHT): 2.87 cm    SHUNTS MV Decel Time: 264 msec    Systemic VTI:  0.22 m MV E velocity: 83.90 cm/s  Systemic Diam: 2.10 cm MV A velocity: 81.90 cm/s MV E/A ratio:  1.02 Fransico Him MD Electronically signed by Fransico Him MD Signature Date/Time: 02/11/2021/1:42:44 PM    Final     Labs: BMET Recent Labs  Lab 02/09/21 0845 02/09/21 1200 02/09/21 1535 02/09/21 2103 02/10/21 0506 02/10/21 1144 02/10/21 1555 02/10/21 2035 02/11/21 0402 02/11/21 1138 02/11/21 1548 02/11/21 1930 02/12/21 0500  NA  --    < > 131*   < > 129*  130*   < > 132* 127* 129*  129* 128* 133* 129* 130*  130*  K  --    < > 5.4*   < > 5.2*  5.2*   < > 4.8 4.9 5.2*  5.2* 5.1 4.6 4.8 4.8  4.8  CL  --    < > 101   < > 91*  91*   < > 93* 91* 94*  94* 94* 101 97* 97*  97*  CO2  --    < > 17*   < > 25  26   < > '29 25 27  26 25 25 23 25  24  '$ GLUCOSE  --    < > 168*   < > 166*  166*   < > 173* 177* 153*  154* 129* 115* 112* 130*  129*  BUN  --    < > 40*   < > 36*  34*   < > 29* 26* 23*  23* 26* 27* 24* 20  20  CREATININE  --    < > 2.67*   < > 2.54*  2.58*   < > 2.18* 2.46* 2.24*  2.27* 2.91* 2.52* 2.70* 2.46*  2.46*  CALCIUM  --    < > 6.4*   < > 6.7*  6.8*   < > 6.8* 7.1* 7.4*  7.4* 7.4* 6.8* 7.4* 7.6*  7.6*  PHOS 10.7*  --  7.2*  --  5.8*  --  4.4  --  3.4  --  2.5  --  2.7   < > = values in this interval not displayed.   CBC Recent Labs  Lab 02/09/21 0830 02/10/21 0506 02/11/21 0402  02/12/21 0500  WBC 30.6* 21.8* 16.2* 18.8*  NEUTROABS 21.0*  --  11.7*  --   HGB 15.9* 12.6 10.5* 9.2*  HCT 49.5* 38.1 31.9* 28.4*  MCV 91.2 87.2 88.1 89.9  PLT 370 201 151 133*    Medications:    . Chlorhexidine Gluconate Cloth  6 each Topical Q0600  . heparin  5,000 Units Subcutaneous Q8H  . mouth rinse  15 mL Mouth Rinse BID  . mupirocin ointment  1 application Nasal BID  . OLANZapine  5 mg Oral QHS  . sertraline  50 mg Oral Daily  . sodium chloride flush  10-40 mL Intracatheter Q12H  . traZODone  150 mg Oral QHS      Madelon Lips, MD 02/12/2021, 11:14 AM

## 2021-02-12 NOTE — Progress Notes (Signed)
Per Dr Lake Bells, CRRT stopped and HD catheter removed. NG placed to LIS and that it is ok for pt to have small amount of ice chips to keep mouth moist.

## 2021-02-12 NOTE — Evaluation (Signed)
Physical Therapy Evaluation Patient Details Name: Renee Wilkinson MRN: [id number] DOB: 1971-05-11 Today's Date: 02/12/2021   History of Present Illness  Pt found down (next to deceased spouse) after smoking crack cocaine and now with AKI, hyperkalemia, rhabdomyolysis, and leukocytosis.  Pt with hx of substance abuse  Clinical Impression  Pt admitted as above and presenting with functional mobility limitations 2* generalized weakness, balance deficits, limited endurance, and abdominal pain.  This date pt tolerated sitting EOB for balance and with nurse present at all times to monitor CRRT port.  Pt would benefit from follow up intensive rehab at CIR level to maximize IND and safety prior to return home with limited assist.    Follow Up Recommendations CIR    Equipment Recommendations  None recommended by PT    Recommendations for Other Services OT consult     Precautions / Restrictions Precautions Precautions: Fall Restrictions Weight Bearing Restrictions: No      Mobility  Bed Mobility Overal bed mobility: Needs Assistance Bed Mobility: Rolling;Sidelying to Sit;Sit to Supine Rolling: Mod assist Sidelying to sit: Mod assist;+2 for safety/equipment   Sit to supine: Mod assist;Max assist;+2 for physical assistance;+2 for safety/equipment   General bed mobility comments: Increased assist for return to bed 2* abdominal pain    Transfers                 General transfer comment: NT - pt limited by fatigue, abdominal pain and CRRT port  Ambulation/Gait                Stairs            Wheelchair Mobility    Modified Rankin (Stroke Patients Only)       Balance Overall balance assessment: Needs assistance Sitting-balance support: Bilateral upper extremity supported;Feet supported Sitting balance-Leahy Scale: Poor                                       Pertinent Vitals/Pain Pain Assessment: Faces Faces Pain Scale: Hurts whole  lot Pain Location: abdomen Pain Descriptors / Indicators: Guarding;Grimacing;Sore;Moaning Pain Intervention(s): Limited activity within patient's tolerance;Monitored during session    Home Living Family/patient expects to be discharged to:: Unsure                      Prior Function Level of Independence: Independent         Comments: Pt states used no assistive device but it was difficult to get around bc of abdominal pain     Hand Dominance        Extremity/Trunk Assessment   Upper Extremity Assessment Upper Extremity Assessment: Generalized weakness (limited testing on R UE 2* CRRT port; L UE ROM WFL)    Lower Extremity Assessment Lower Extremity Assessment: RLE deficits/detail;LLE deficits/detail;Generalized weakness;Difficult to assess due to impaired cognition RLE Deficits / Details: Pt inconsistent with follow cues but able to DF to neutral and perform LAQ with sitting on EOB LLE Deficits / Details: Pt very inconsistent with following cues but with noted DF almost to neutral and 3-/5 quads with pt able to perform a near complete LAQ with sitting EOB    Cervical / Trunk Assessment Cervical / Trunk Assessment: Normal  Communication   Communication: No difficulties  Cognition Arousal/Alertness: Awake/alert Behavior During Therapy: Restless Overall Cognitive Status: No family/caregiver present to determine baseline cognitive functioning  General Comments: Anxious and with slow processing.  Inconsistent following cues.      General Comments      Exercises     Assessment/Plan    PT Assessment Patient needs continued PT services  PT Problem List Decreased strength;Decreased range of motion;Decreased activity tolerance;Decreased balance;Decreased mobility;Decreased coordination;Decreased cognition;Decreased knowledge of use of DME;Pain;Obesity       PT Treatment Interventions DME instruction;Gait  training;Functional mobility training;Therapeutic activities;Therapeutic exercise;Patient/family education;Balance training    PT Goals (Current goals can be found in the Care Plan section)  Acute Rehab PT Goals Patient Stated Goal: Get better PT Goal Formulation: With patient Time For Goal Achievement: 02/26/21 Potential to Achieve Goals: Good    Frequency Min 3X/week   Barriers to discharge Decreased caregiver support Recent death of spouse    Co-evaluation               AM-PAC PT "6 Clicks" Mobility  Outcome Measure Help needed turning from your back to your side while in a flat bed without using bedrails?: A Lot Help needed moving from lying on your back to sitting on the side of a flat bed without using bedrails?: A Lot Help needed moving to and from a bed to a chair (including a wheelchair)?: Total Help needed standing up from a chair using your arms (e.g., wheelchair or bedside chair)?: Total Help needed to walk in hospital room?: Total Help needed climbing 3-5 steps with a railing? : Total 6 Click Score: 8    End of Session   Activity Tolerance: Patient limited by fatigue;Patient limited by pain Patient left: in bed;with call bell/phone within reach;with nursing/sitter in room;with bed alarm set Nurse Communication: Mobility status PT Visit Diagnosis: Muscle weakness (generalized) (M62.81);Pain;History of falling (Z91.81);Difficulty in walking, not elsewhere classified (R26.2) Pain - part of body:  (abdomen)    Time: [id number] PT Time Calculation (min) (ACUTE ONLY): 23 min   Charges:   PT Evaluation $PT Eval Moderate Complexity: 1 Mod PT Treatments $Therapeutic Activity: 8-22 mins        Debe Coder PT Acute Rehabilitation Services Pager [phone] Office [phone]   Chantavia Bazzle 02/12/2021, 1:09 PM

## 2021-02-12 NOTE — Progress Notes (Signed)
eLink Physician-Brief Progress Note Patient Name: Miliani Lochhead DOB: Jun 17, 1971 MRN: [id number]   Date of Service  02/12/2021  HPI/Events of Note  Nursing request for GERD medication   eICU Interventions  Plan: 1. Pepcid 20 mg pet tube now and Q day.      Intervention Category Major Interventions: Other:  Lysle Dingwall 02/12/2021, 8:43 PM

## 2021-02-12 NOTE — Progress Notes (Signed)
NAME:  Renee Wilkinson, MRN:  [id number], DOB:  Jul 09, 1971, LOS: 3 ADMISSION DATE:  02/09/2021, CONSULTATION DATE:  3/17 REFERRING MD:  EDP, CHIEF COMPLAINT:  Abdominal pain   Brief History:  50 year old admits to cocaine use who was found down beside that husband admitted with hyperkalemia, renal failure, shock liver with chief complaint of abdominal pain with imaging consistent with renal infarcts. Admitted to ICU, intubated, CRRT.  Pertinent  Medical History  Psychosis with behavioral health admit in 2021  Significant Hospital Events: Including procedures, antibiotic start and stop dates in addition to other pertinent events   . Admitted to ICU 3/17, VDRF, started CRRT . 3/17 ETT >3/17 . 3/17 R IJ HD cath, exchanged over wire on 3/19>  . 3/18 more awake, mottling of feet, numbness left leg> concern for compartment syndrome, consulted orthopedics, no compartment syndrome, started on neosynephrine overnight, psychiatry consult . 3/18 RUQ ultrasound > positive murphy's but no GB wall thickening; there are stones and sludge present, upper limits of normal CBD . 3/19 coox 55, awake, started on levophed . 3/19 TTE> LVEF 60-65%, RV OK . 3/20 off vasopressors, still has left leg pain/weakness  Interim History / Subjective:   3/20 low grade temp yesterday WBC up slightly Off vasopressors Still has left leg pain/weakness HD cath pulled back after being exchanged   Objective   Blood pressure (!) 114/55, pulse 78, temperature 98.8 F (37.1 C), temperature source Oral, resp. rate (!) 25, height '5\' 5"'$  (1.651 m), weight 108 kg, SpO2 95 %. CVP:  [6 mmHg-7 mmHg] 7 mmHg      Intake/Output Summary (Last 24 hours) at 02/12/2021 0809 Last data filed at 02/12/2021 0800 Gross per 24 hour  Intake 2400.64 ml  Output 1648.5 ml  Net 752.14 ml   Filed Weights   02/09/21 0755 02/10/21 0500 02/12/21 0448  Weight: 104.3 kg 108.1 kg 108 kg    Examination:  General:  Resting comfortably in  bed HENT: NCAT OP clear PULM: CTA B, normal effort CV: RRR, no mgr GI: BS+, soft, nontender MSK: normal bulk and tone Neuro: awake, alert, no distress, cannot move left leg very well: can flex/extend at foot, move leg across bed but not against gravity   Labs/imaging personally reviewed   3/17 SARS COV 2/Flu > neg 3/17 blood cx >   Resolved Hospital Problem list   Acute respiratory failure with hypoxemia due to inability to protect airway Mild hypotension/crrt related?  Assessment & Plan:  Acute metabolic encephalopathy due to drug overdose> resolved Need for sedation for mechanical ventilation > resolved Suicidal ideation> contracts for safety, no plan, has been seen by psyche who feels she doesn't need inpatient care Acute grief> husband died Schizophrenia depression Minimize sedation F/u with psyche after discharge Add olanzapine, zoloft, trazodone Wean off precedex Prn xanax  Fever, low grade temp briefly on 3/19 no signs of sepsis as has improved hemodynamically Monitor clinically Hold antibiotics/cultures  Left leg numbness/weakness Mid back pain extending around to RUQ CT lumbar/thoracic spine  AKI, rhabdomyolysis left leg/hip Renal infarct Monitor BMET and UOP Replace electrolytes as needed CRRT per renal  Shock liver> improving LFT on 3/20 RUQ pain 3/18 > better 3/19, persistent 3/20, due to shock liver? APAP negative on admission If pain worsens then HIDA  Abdominal exams daily and prn Monitor LFT  Demand ischemia after being found down post narcotic overdose Echo OK, she does not have cardiogenic shock Shock has resolved Monitor hemodynamics tele   Best practice (evaluated daily)  Diet:  Oral Pain/Anxiety/Delirium protocol (if indicated): No VAP protocol (if indicated): Not indicated DVT prophylaxis: Subcutaneous Heparin GI prophylaxis: N/A Glucose control:  SSI No Central venous access:  Yes, and it is still needed Arterial line:   N/A Foley:  Yes, and it is still needed Mobility:  OOB  PT consulted: Yes Last date of multidisciplinary goals of care discussion [3/17] I updated her daughter by phone on 3/19 Code Status:  full code Disposition: remain in ICU  Labs   CBC: Recent Labs  Lab 02/09/21 0830 02/10/21 0506 02/11/21 0402 02/12/21 0500  WBC 30.6* 21.8* 16.2* 18.8*  NEUTROABS 21.0*  --  11.7*  --   HGB 15.9* 12.6 10.5* 9.2*  HCT 49.5* 38.1 31.9* 28.4*  MCV 91.2 87.2 88.1 89.9  PLT 370 201 151 133*    Basic Metabolic Panel: Recent Labs  Lab 02/09/21 0845 02/09/21 1200 02/10/21 0506 02/10/21 1144 02/10/21 1555 02/10/21 2035 02/11/21 0402 02/11/21 1138 02/11/21 1548 02/11/21 1930 02/12/21 0500  NA  --    < > 129*  130*   < > 132*   < > 129*  129* 128* 133* 129* 130*  130*  K  --    < > 5.2*  5.2*   < > 4.8   < > 5.2*  5.2* 5.1 4.6 4.8 4.8  4.8  CL  --    < > 91*  91*   < > 93*   < > 94*  94* 94* 101 97* 97*  97*  CO2  --    < > 25  26   < > 29   < > '27  26 25 25 23 25  24  '$ GLUCOSE  --    < > 166*  166*   < > 173*   < > 153*  154* 129* 115* 112* 130*  129*  BUN  --    < > 36*  34*   < > 29*   < > 23*  23* 26* 27* 24* 20  20  CREATININE  --    < > 2.54*  2.58*   < > 2.18*   < > 2.24*  2.27* 2.91* 2.52* 2.70* 2.46*  2.46*  CALCIUM  --    < > 6.7*  6.8*   < > 6.8*   < > 7.4*  7.4* 7.4* 6.8* 7.4* 7.6*  7.6*  MG 3.1*  --  2.3  --   --   --  2.4  --   --   --  2.4  PHOS 10.7*   < > 5.8*  --  4.4  --  3.4  --  2.5  --  2.7   < > = values in this interval not displayed.   GFR: Estimated Creatinine Clearance: 33.4 mL/min (A) (by C-G formula based on SCr of 2.46 mg/dL (H)). Recent Labs  Lab 02/09/21 0830 02/09/21 0845 02/09/21 1108 02/10/21 0506 02/11/21 0402 02/12/21 0500  WBC 30.6*  --   --  21.8* 16.2* 18.8*  LATICACIDVEN  --  4.4* 4.0*  --   --   --     Liver Function Tests: Recent Labs  Lab 02/09/21 0830 02/09/21 1535 02/10/21 0506 02/10/21 1555  02/11/21 0402 02/11/21 1548 02/12/21 0500  AST 1,411*  --  1,303*  --  808*  --  661*  ALT 467*  --  524*  --  387*  --  307*  ALKPHOS 199*  --  124  --  99  --  90  BILITOT 0.7  --  0.9  --  0.8  --  0.8  PROT 9.6*  --  7.0  --  6.4*  --  6.3*  ALBUMIN 4.0   < > 2.8*  2.9* 2.7* 2.4*  2.4* 2.2* 2.3*  2.4*   < > = values in this interval not displayed.   No results for input(s): LIPASE, AMYLASE in the last 168 hours. No results for input(s): AMMONIA in the last 168 hours.  ABG    Component Value Date/Time   HCO3 14.7 (L) 02/09/2021 1105   ACIDBASEDEF 12.7 (H) 02/09/2021 1105   O2SAT 55.9 02/11/2021 0938     Coagulation Profile: No results for input(s): INR, PROTIME in the last 168 hours.  Cardiac Enzymes: Recent Labs  Lab 02/09/21 0830  CKTOTAL >50,000*    HbA1C: No results found for: HGBA1C  CBG: Recent Labs  Lab 02/09/21 1522  GLUCAP 158*     Critical care time: 35 minutes     [name], MD [facility] Pager: [phone] Cell: [phone] If no response, please call [phone] until 7pm After 7:00 pm call Elink  [phone]

## 2021-02-12 NOTE — Consult Note (Signed)
Reason for Consult:  Umbilical hernia Referring Physician: Marki Frede is an 50 y.o. female.  HPI:  Pt is a 50 yo F who was admitted 02/09/21 for rhabdomyolysis, hyperkalemia and inability to move her legs.  She and spouse used cocaine/oral narcotics and she passed out.  When she awoke, she couldn't move and her husband was dead beside her.  She screamed for 6 hours until she got help.  She had CRRT for several days.  She also had right sided abdominal pain that was felt to either be renal infarct or shock liver.   Given the Right sided pain, RUQ u/s was performed, no GB wall thickening, but stones and sludge present.  Transaminases elevated, but alk phos and t bili were ok.  She had episode of emesis today.   Today lumbar spine CT was done as she still had issues moving LLE.  Upon starting the scan she was seen to have dilated bowel so a CT abdomen was also performed.  She had NGT placed.  She continues to deny pain other than the right lateral abdomen with the RUQ being the worst.    History reviewed. No pertinent past medical history.   No family history on file.  Social History:  H/o homelessness Drug abuse  Allergies: No Known Allergies  Medications:  Prior to Admission:  Medications Prior to Admission  Medication Sig Dispense Refill Last Dose  . ibuprofen (ADVIL) 200 MG tablet Take 200 mg by mouth every 6 (six) hours as needed for mild pain.   Past Week at Unknown time    Results for orders placed or performed during the hospital encounter of 02/09/21 (from the past 48 hour(s))  Basic metabolic panel     Status: Abnormal   Collection Time: 02/10/21  8:35 PM  Result Value Ref Range   Sodium 127 (L) 135 - 145 mmol/L   Potassium 4.9 3.5 - 5.1 mmol/L   Chloride 91 (L) 98 - 111 mmol/L   CO2 25 22 - 32 mmol/L   Glucose, Bld 177 (H) 70 - 99 mg/dL    Comment: Glucose reference range applies only to samples taken after fasting for at least 8 hours.   BUN 26 (H) 6 - 20  mg/dL   Creatinine, Ser 2.46 (H) 0.44 - 1.00 mg/dL   Calcium 7.1 (L) 8.9 - 10.3 mg/dL   GFR, Estimated 23 (L) >60 mL/min    Comment: (NOTE) Calculated using the CKD-EPI Creatinine Equation (2021)    Anion gap 11 5 - 15    Comment: Performed at [facility], [place] [address], [place], [place] [address]  Renal function panel (daily at 0500)     Status: Abnormal   Collection Time: 02/11/21  4:02 AM  Result Value Ref Range   Sodium 129 (L) 135 - 145 mmol/L   Potassium 5.2 (H) 3.5 - 5.1 mmol/L   Chloride 94 (L) 98 - 111 mmol/L   CO2 26 22 - 32 mmol/L   Glucose, Bld 154 (H) 70 - 99 mg/dL    Comment: Glucose reference range applies only to samples taken after fasting for at least 8 hours.   BUN 23 (H) 6 - 20 mg/dL   Creatinine, Ser 2.27 (H) 0.44 - 1.00 mg/dL   Calcium 7.4 (L) 8.9 - 10.3 mg/dL   Phosphorus 3.4 2.5 - 4.6 mg/dL   Albumin 2.4 (L) 3.5 - 5.0 g/dL   GFR, Estimated 26 (L) >60 mL/min    Comment: (  NOTE) Calculated using the CKD-EPI Creatinine Equation (2021)    Anion gap 9 5 - 15    Comment: Performed at [facility], [place] [address], [place], [place] [address]  Magnesium     Status: None   Collection Time: 02/11/21  4:02 AM  Result Value Ref Range   Magnesium 2.4 1.7 - 2.4 mg/dL    Comment: Performed at [facility], [place] [address] [place], [place] [address]  Comprehensive metabolic panel     Status: Abnormal   Collection Time: 02/11/21  4:02 AM  Result Value Ref Range   Sodium 129 (L) 135 - 145 mmol/L   Potassium 5.2 (H) 3.5 - 5.1 mmol/L   Chloride 94 (L) 98 - 111 mmol/L   CO2 27 22 - 32 mmol/L   Glucose, Bld 153 (H) 70 - 99 mg/dL    Comment: Glucose reference range applies only to samples taken after fasting for at least 8 hours.   BUN 23 (H) 6 - 20 mg/dL   Creatinine, Ser 2.24 (H) 0.44 - 1.00 mg/dL   Calcium 7.4 (L) 8.9 - 10.3 mg/dL   Total Protein 6.4 (L) 6.5 - 8.1 g/dL   Albumin 2.4 (L) 3.5 - 5.0 g/dL   AST  808 (H) 15 - 41 U/L   ALT 387 (H) 0 - 44 U/L   Alkaline Phosphatase 99 38 - 126 U/L   Total Bilirubin 0.8 0.3 - 1.2 mg/dL   GFR, Estimated 26 (L) >60 mL/min    Comment: (NOTE) Calculated using the CKD-EPI Creatinine Equation (2021)    Anion gap 8 5 - 15    Comment: Performed at [facility], [place] [address] [place], [place] [address]  CBC with Differential/Platelet     Status: Abnormal   Collection Time: 02/11/21  4:02 AM  Result Value Ref Range   WBC 16.2 (H) 4.0 - 10.5 K/uL   RBC 3.62 (L) 3.87 - 5.11 MIL/uL   Hemoglobin 10.5 (L) 12.0 - 15.0 g/dL   HCT 31.9 (L) 36.0 - 46.0 %   MCV 88.1 80.0 - 100.0 fL   MCH 29.0 26.0 - 34.0 pg   MCHC 32.9 30.0 - 36.0 g/dL   RDW 14.1 11.5 - 15.5 %   Platelets 151 150 - 400 K/uL   nRBC 0.1 0.0 - 0.2 %   Neutrophils Relative % 73 %   Neutro Abs 11.7 (H) 1.7 - 7.7 K/uL   Lymphocytes Relative 17 %   Lymphs Abs 2.8 0.7 - 4.0 K/uL   Monocytes Relative 9 %   Monocytes Absolute 1.4 (H) 0.1 - 1.0 K/uL   Eosinophils Relative 0 %   Eosinophils Absolute 0.0 0.0 - 0.5 K/uL   Basophils Relative 0 %   Basophils Absolute 0.1 0.0 - 0.1 K/uL   Immature Granulocytes 1 %   Abs Immature Granulocytes 0.15 (H) 0.00 - 0.07 K/uL    Comment: Performed at [facility], [place] [address] [place], [place] [address]  APTT     Status: Abnormal   Collection Time: 02/11/21  8:08 AM  Result Value Ref Range   aPTT 38 (H) 24 - 36 seconds    Comment:        IF BASELINE aPTT IS ELEVATED, SUGGEST PATIENT RISK ASSESSMENT BE USED TO DETERMINE APPROPRIATE ANTICOAGULANT THERAPY. Performed at [facility], [place] [address], [place], [place] [address]   .Cooxemetry Panel (carboxy, met, total hgb, O2 sat)  Status: Abnormal   Collection Time: 02/11/21  9:38 AM  Result Value Ref Range   Total hemoglobin 10.0 (L) 12.0 - 16.0 g/dL   O2 Saturation 55.9 %   Carboxyhemoglobin 1.6 (H) 0.5 - 1.5 %   Methemoglobin 0.9 0.0 - 1.5 %     Comment: Performed at [facility], [place] [address] [place], [place] [address]  Basic metabolic panel     Status: Abnormal   Collection Time: 02/11/21 11:38 AM  Result Value Ref Range   Sodium 128 (L) 135 - 145 mmol/L   Potassium 5.1 3.5 - 5.1 mmol/L   Chloride 94 (L) 98 - 111 mmol/L   CO2 25 22 - 32 mmol/L   Glucose, Bld 129 (H) 70 - 99 mg/dL    Comment: Glucose reference range applies only to samples taken after fasting for at least 8 hours.   BUN 26 (H) 6 - 20 mg/dL   Creatinine, Ser 2.91 (H) 0.44 - 1.00 mg/dL   Calcium 7.4 (L) 8.9 - 10.3 mg/dL   GFR, Estimated 19 (L) >60 mL/min    Comment: (NOTE) Calculated using the CKD-EPI Creatinine Equation (2021)    Anion gap 9 5 - 15    Comment: Performed at [facility], [place] [address], [place], [place] [address]  Renal function panel (daily at 1600)     Status: Abnormal   Collection Time: 02/11/21  3:48 PM  Result Value Ref Range   Sodium 133 (L) 135 - 145 mmol/L   Potassium 4.6 3.5 - 5.1 mmol/L   Chloride 101 98 - 111 mmol/L   CO2 25 22 - 32 mmol/L   Glucose, Bld 115 (H) 70 - 99 mg/dL    Comment: Glucose reference range applies only to samples taken after fasting for at least 8 hours.   BUN 27 (H) 6 - 20 mg/dL   Creatinine, Ser 2.52 (H) 0.44 - 1.00 mg/dL   Calcium 6.8 (L) 8.9 - 10.3 mg/dL   Phosphorus 2.5 2.5 - 4.6 mg/dL   Albumin 2.2 (L) 3.5 - 5.0 g/dL   GFR, Estimated 23 (L) >60 mL/min    Comment: (NOTE) Calculated using the CKD-EPI Creatinine Equation (2021)    Anion gap 7 5 - 15    Comment: Performed at [facility], [place] [address] [place], [place] [address]  Basic metabolic panel     Status: Abnormal   Collection Time: 02/11/21  7:30 PM  Result Value Ref Range   Sodium 129 (L) 135 - 145 mmol/L   Potassium 4.8 3.5 - 5.1 mmol/L   Chloride 97 (L) 98 - 111 mmol/L   CO2 23 22 - 32 mmol/L   Glucose, Bld 112 (H) 70 - 99 mg/dL    Comment: Glucose reference range  applies only to samples taken after fasting for at least 8 hours.   BUN 24 (H) 6 - 20 mg/dL   Creatinine, Ser 2.70 (H) 0.44 - 1.00 mg/dL   Calcium 7.4 (L) 8.9 - 10.3 mg/dL   GFR, Estimated 21 (L) >60 mL/min    Comment: (NOTE) Calculated using the CKD-EPI Creatinine Equation (2021)    Anion gap 9 5 - 15    Comment: Performed at [facility], [place] [address], [place], [place] [address]  Renal function panel (daily at 0500)     Status: Abnormal   Collection Time: 02/12/21  5:00 AM  Result Value Ref Range   Sodium 130 (L) 135 - 145 mmol/L  Potassium 4.8 3.5 - 5.1 mmol/L   Chloride 97 (L) 98 - 111 mmol/L   CO2 24 22 - 32 mmol/L   Glucose, Bld 129 (H) 70 - 99 mg/dL    Comment: Glucose reference range applies only to samples taken after fasting for at least 8 hours.   BUN 20 6 - 20 mg/dL   Creatinine, Ser 2.46 (H) 0.44 - 1.00 mg/dL   Calcium 7.6 (L) 8.9 - 10.3 mg/dL   Phosphorus 2.7 2.5 - 4.6 mg/dL   Albumin 2.4 (L) 3.5 - 5.0 g/dL   GFR, Estimated 23 (L) >60 mL/min    Comment: (NOTE) Calculated using the CKD-EPI Creatinine Equation (2021)    Anion gap 9 5 - 15    Comment: Performed at [facility], [place] [address], [place], [place] [address]  Magnesium     Status: None   Collection Time: 02/12/21  5:00 AM  Result Value Ref Range   Magnesium 2.4 1.7 - 2.4 mg/dL    Comment: Performed at [facility], [place] [address] [place], [place] [address]  CBC     Status: Abnormal   Collection Time: 02/12/21  5:00 AM  Result Value Ref Range   WBC 18.8 (H) 4.0 - 10.5 K/uL   RBC 3.16 (L) 3.87 - 5.11 MIL/uL   Hemoglobin 9.2 (L) 12.0 - 15.0 g/dL   HCT 28.4 (L) 36.0 - 46.0 %   MCV 89.9 80.0 - 100.0 fL   MCH 29.1 26.0 - 34.0 pg   MCHC 32.4 30.0 - 36.0 g/dL   RDW 14.1 11.5 - 15.5 %   Platelets 133 (L) 150 - 400 K/uL   nRBC 0.4 (H) 0.0 - 0.2 %    Comment: Performed at [facility], [place] [address] [place], [place] [address]   Comprehensive metabolic panel     Status: Abnormal   Collection Time: 02/12/21  5:00 AM  Result Value Ref Range   Sodium 130 (L) 135 - 145 mmol/L   Potassium 4.8 3.5 - 5.1 mmol/L   Chloride 97 (L) 98 - 111 mmol/L   CO2 25 22 - 32 mmol/L   Glucose, Bld 130 (H) 70 - 99 mg/dL    Comment: Glucose reference range applies only to samples taken after fasting for at least 8 hours.   BUN 20 6 - 20 mg/dL   Creatinine, Ser 2.46 (H) 0.44 - 1.00 mg/dL   Calcium 7.6 (L) 8.9 - 10.3 mg/dL   Total Protein 6.3 (L) 6.5 - 8.1 g/dL   Albumin 2.3 (L) 3.5 - 5.0 g/dL   AST 661 (H) 15 - 41 U/L   ALT 307 (H) 0 - 44 U/L   Alkaline Phosphatase 90 38 - 126 U/L   Total Bilirubin 0.8 0.3 - 1.2 mg/dL   GFR, Estimated 23 (L) >60 mL/min    Comment: (NOTE) Calculated using the CKD-EPI Creatinine Equation (2021)    Anion gap 8 5 - 15    Comment: Performed at [facility], [place] [address] [place], [place] [address]  APTT     Status: Abnormal   Collection Time: 02/12/21  5:00 AM  Result Value Ref Range   aPTT 39 (H) 24 - 36 seconds    Comment:        IF BASELINE aPTT IS ELEVATED, SUGGEST PATIENT RISK ASSESSMENT BE USED TO DETERMINE APPROPRIATE ANTICOAGULANT THERAPY. Performed at [facility], [place] [address] [place],  [address]   Renal  function panel (daily at 0500)     Status: Abnormal   Collection Time: 02/12/21  1:20 PM  Result Value Ref Range   Sodium 129 (L) 135 - 145 mmol/L   Potassium 4.7 3.5 - 5.1 mmol/L   Chloride 96 (L) 98 - 111 mmol/L   CO2 25 22 - 32 mmol/L   Glucose, Bld 127 (H) 70 - 99 mg/dL    Comment: Glucose reference range applies only to samples taken after fasting for at least 8 hours.   BUN 20 6 - 20 mg/dL   Creatinine, Ser 2.72 (H) 0.44 - 1.00 mg/dL   Calcium 7.7 (L) 8.9 - 10.3 mg/dL   Phosphorus 2.7 2.5 - 4.6 mg/dL   Albumin 2.5 (L) 3.5 - 5.0 g/dL   GFR, Estimated 21 (L) >60 mL/min    Comment: (NOTE) Calculated using the CKD-EPI  Creatinine Equation (2021)    Anion gap 8 5 - 15    Comment: Performed at [facility], [place] [address], [place], [place] [address]  Basic metabolic panel     Status: Abnormal   Collection Time: 02/12/21  1:20 PM  Result Value Ref Range   Sodium 131 (L) 135 - 145 mmol/L   Potassium 4.7 3.5 - 5.1 mmol/L   Chloride 97 (L) 98 - 111 mmol/L   CO2 25 22 - 32 mmol/L   Glucose, Bld 129 (H) 70 - 99 mg/dL    Comment: Glucose reference range applies only to samples taken after fasting for at least 8 hours.   BUN 20 6 - 20 mg/dL   Creatinine, Ser 2.56 (H) 0.44 - 1.00 mg/dL   Calcium 7.8 (L) 8.9 - 10.3 mg/dL   GFR, Estimated 22 (L) >60 mL/min    Comment: (NOTE) Calculated using the CKD-EPI Creatinine Equation (2021)    Anion gap 9 5 - 15    Comment: Performed at [facility], [place] [address] [place], [place] [address]  Protime-INR     Status: None   Collection Time: 02/12/21  5:00 PM  Result Value Ref Range   Prothrombin Time 12.7 11.4 - 15.2 seconds   INR 1.0 0.8 - 1.2    Comment: (NOTE) INR goal varies based on device and disease states. Performed at [facility], [place] [address] [place], [place] [address]     CT ABDOMEN PELVIS WO CONTRAST  Result Date: 02/12/2021 CLINICAL DATA:  Low back pain, abdominal pain, acute renal failure. EXAM: CT ABDOMEN AND PELVIS WITHOUT CONTRAST TECHNIQUE: Multidetector CT imaging of the abdomen and pelvis was performed following the standard protocol without IV contrast. COMPARISON:  Abdominal ultrasound dated 02/10/2021 and CT abdomen pelvis dated 02/09/2021. FINDINGS: Lower chest: There is a moderate bibasilar atelectasis. Hepatobiliary: No focal liver abnormality is seen. No gallstones, gallbladder wall thickening, or biliary dilatation. Pancreas: Unremarkable. No pancreatic ductal dilatation or surrounding inflammatory changes. Spleen: Normal in size without focal abnormality. Adrenals/Urinary Tract:  Adrenal glands are unremarkable. Kidneys appear normal on this non contrast exam, without renal calculi, focal lesion, or hydronephrosis. A Foley catheter is in place. Stomach/Bowel: Stomach is within normal limits. There is a high-grade small bowel obstruction with a transition point to decompressed distal small bowel within an infraumbilical ventral abdominal wall hernia containing a loop of small bowel. Multiple dilated loops of small bowel with air-fluid levels and fecalization of bowel contents are seen proximal to the obstruction. The appendix appears normal. No evidence of bowel inflammatory changes. Vascular/Lymphatic: Aortic atherosclerosis. No  enlarged abdominal or pelvic lymph nodes. Reproductive: Uterus and bilateral adnexa are unremarkable. Other: Trace free fluid is seen in the pelvis. Musculoskeletal: Degenerative changes are seen at L5-S1. IMPRESSION: 1. High-grade small bowel obstruction with a transition point within an infraumbilical ventral abdominal wall hernia. Aortic Atherosclerosis (ICD10-I70.0). These results were called by telephone at the time of interpretation on 02/12/2021 at 2:39 pm to provider [facility] , who verbally acknowledged these results. Electronically Signed   By: Zerita Boers M.D.   On: 02/12/2021 14:40   DG Chest 1 View  Result Date: 02/11/2021 CLINICAL DATA:  Central line placement EXAM: CHEST  1 VIEW COMPARISON:  02/09/2021 FINDINGS: Right internal jugular center venous catheter tip: Lower SVC. No pneumothorax. Low lung volumes. Subsegmental atelectasis along both hemidiaphragms. Cardiac and mediastinal margins appear normal. IMPRESSION: 1. Right internal jugular center venous catheter tip: Lower SVC. No pneumothorax. 2. Subsegmental atelectasis along both hemidiaphragms. Electronically Signed   By: Van Clines M.D.   On: 02/11/2021 15:02   DG Abd 1 View  Result Date: 02/12/2021 CLINICAL DATA:  NG tube placement EXAM: ABDOMEN - 1 VIEW COMPARISON:  CT  02/12/2021 FINDINGS: NG tube tip is in the proximal stomach with the side port in the distal esophagus. Dilated small bowel loops compatible with small bowel obstruction. IMPRESSION: NG tube tip in the proximal stomach. Electronically Signed   By: Rolm Baptise M.D.   On: 02/12/2021 15:38   CT THORACIC SPINE WO CONTRAST  Result Date: 02/12/2021 CLINICAL DATA:  Possible IV drug use. Left leg weakness and back pain. EXAM: CT THORACIC SPINE WITHOUT CONTRAST TECHNIQUE: Multidetector CT images of the thoracic were obtained using the standard protocol without intravenous contrast. COMPARISON:  None. FINDINGS: Alignment: Increased kyphotic curvature. Vertebrae: No fracture or primary bone lesion. No finding to suggest bone infection or disc space infection. Paraspinal and other soft tissues: Negative other than atelectasis or scarring in the lower lobes. Disc levels: Chronic disc space narrowing from T3-4 through T9-10. Small endplate osteophytes. No apparent bony stenosis of the canal or foramina. Ordinary mild facet osteoarthritis. No finding to suggest regional infection. IMPRESSION: Increased kyphotic curvature. Chronic degenerative disc disease from T3-4 through T9-10. No apparent bony stenosis of the canal or foramina. No finding to suggest bone infection or disc space infection. Electronically Signed   By: Nelson Chimes M.D.   On: 02/12/2021 13:17   CT LUMBAR SPINE WO CONTRAST  Result Date: 02/12/2021 CLINICAL DATA:  Possible IV drug use. Back pain and left leg numbness. EXAM: CT LUMBAR SPINE WITHOUT CONTRAST TECHNIQUE: Multidetector CT imaging of the lumbar spine was performed without intravenous contrast administration. Multiplanar CT image reconstructions were also generated. COMPARISON:  None. FINDINGS: Segmentation: 5 lumbar type vertebral bodies. Alignment: 2 mm degenerative anterolisthesis L4-5. Vertebrae: No fracture or focal bone lesion. Chronic discogenic endplate sclerotic changes at L5-S1. No bone  finding to suggest infection. Paraspinal and other soft tissues: Negative Disc levels: No significant finding from T12-L1 through L3-4. L4-5: Advanced bilateral facet arthropathy with gaping joints. 2 mm of anterolisthesis, which would likely worsen with standing or flexion. Bulging of the disc. Multifactorial stenosis that would likely worsen with standing or flexion. Neural compression could occur at this level. L5-S1: Chronic disc degeneration with loss of disc height. Endplate osteophytes and bulging of the disc. Vacuum phenomenon. Chronic endplate sclerosis without evidence of erosion. Bilateral foraminal stenosis could affect either L5 nerve. 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 IMPRESSION: 1. L4-5: Advanced bilateral facet arthropathy  with gaping joints. 2 mm of anterolisthesis, which would likely worsen with standing or flexion. Bulging of the disc. Multifactorial stenosis that would likely worsen with standing or flexion. Neural compression could occur at this level. 2. L5-S1: Chronic disc degeneration with loss of disc height. Endplate osteophytes and bulging of the disc. Bilateral foraminal stenosis could affect either L5 nerve. 3. 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 4. No finding to suggest regional spinal infection. Electronically Signed   By: Nelson Chimes M.D.   On: 02/12/2021 13:20   DG CHEST PORT 1 VIEW  Result Date: 02/12/2021 CLINICAL DATA:  Central line complication. Concern for location of central line. EXAM: PORTABLE CHEST 1 VIEW COMPARISON:  Prior chest radiograph 02/11/2021. FINDINGS: Right IJ approach central venous catheter with tip projecting in the region of the mid to upper SVC. Shallow inspiration radiograph. Heart size within normal limits. As before, there is subsegmental atelectasis within both lung bases. No appreciable pleural effusion or evidence of pneumothorax. No acute bony abnormality identified. IMPRESSION: Right IJ approach  central venous catheter with tip projecting in the region of the mid to upper SVC. Shallow inspiration radiograph. Redemonstrated bibasilar subsegmental atelectasis. Electronically Signed   By: Kellie Simmering DO   On: 02/12/2021 13:40   ECHOCARDIOGRAM COMPLETE  Result Date: 02/11/2021    ECHOCARDIOGRAM REPORT   Patient Name:   Renee Wilkinson Date of Exam: 02/11/2021 Medical Rec #:  [id number]       Height:       65.0 in Accession #:    [id number]      Weight:       238.3 lb Date of Birth:  [birth year]       BSA:          2.131 m Patient Age:    15 years        BP:           108/46 mmHg Patient Gender: F               HR:           70 bpm. 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  Sonographer:    Jonelle Sidle Dance Referring Phys: [place] Comments: No subcostal window. IMPRESSIONS  1. Left ventricular ejection fraction, by estimation, is 60 to 65%. The left ventricle has normal function. The left ventricle has no regional wall motion abnormalities. Left ventricular diastolic parameters were normal.  2. Right ventricular systolic function was not well visualized. The right ventricular size is not well visualized. Tricuspid regurgitation signal is inadequate for assessing PA pressure.  3. The mitral valve is normal in structure. No evidence of mitral valve regurgitation. No evidence of mitral stenosis.  4. The aortic valve is normal in structure. Aortic valve regurgitation is not visualized. No aortic stenosis is present.  5. The inferior vena cava is normal in size with greater than 50% respiratory variability, suggesting right atrial pressure of 3 mmHg. FINDINGS  Left Ventricle: Left ventricular ejection fraction, by estimation, is 60 to 65%. The left ventricle has normal function. The  left ventricle has no regional wall motion abnormalities. Definity contrast agent was given IV to delineate the left ventricular  endocardial borders.  The left ventricular internal cavity size was normal in size. There is no left ventricular hypertrophy. Left ventricular diastolic parameters were normal. Normal left ventricular filling pressure. Right Ventricle: The right ventricular size is not well visualized. Right vetricular wall thickness was not assessed. Right ventricular systolic function was not well visualized. Tricuspid regurgitation signal is inadequate for assessing PA pressure. Left Atrium: Left atrial size was normal in size. Right Atrium: Right atrial size was normal in size. Pericardium: There is no evidence of pericardial effusion. Mitral Valve: The mitral valve is normal in structure. No evidence of mitral valve regurgitation. No evidence of mitral valve stenosis. Tricuspid Valve: The tricuspid valve is normal in structure. Tricuspid valve regurgitation is trivial. No evidence of tricuspid stenosis. Aortic Valve: The aortic valve is normal in structure. Aortic valve regurgitation is not visualized. No aortic stenosis is present. Pulmonic Valve: The pulmonic valve was normal in structure. Pulmonic valve regurgitation is not visualized. No evidence of pulmonic stenosis. Aorta: The aortic root is normal in size and structure. Venous: The inferior vena cava was not well visualized. The inferior vena cava is normal in size with greater than 50% respiratory variability, suggesting right atrial pressure of 3 mmHg. IAS/Shunts: No atrial level shunt detected by color flow Doppler.  LEFT VENTRICLE PLAX 2D LVIDd:         4.30 cm  Diastology LVIDs:         3.00 cm  LV e' medial:    8.81 cm/s LV PW:         1.00 cm  LV E/e' medial:  9.5 LV IVS:        0.90 cm  LV e' lateral:   9.79 cm/s LVOT diam:     2.10 cm  LV E/e' lateral: 8.6 LV SV:         74 LV SV Index:   35 LVOT Area:     3.46 cm  LEFT ATRIUM              Index LA diam:        3.50 cm 1.64 cm/m LA Vol (A2C):   45.3 ml 21.26 ml/m LA Vol (A4C):   30.5 ml 14.31 ml/m LA Biplane Vol: 40.4 ml 18.96 ml/m  AORTIC VALVE LVOT Vmax:   133.00 cm/s LVOT Vmean:  78.900 cm/s LVOT VTI:    0.215 m  AORTA Ao Root diam: 3.10 cm Ao Asc diam:  2.80 cm MITRAL VALVE MV Area (PHT): 2.87 cm    SHUNTS MV Decel Time: 264 msec    Systemic VTI:  0.22 m MV E velocity: 83.90 cm/s  Systemic Diam: 2.10 cm MV A velocity: 81.90 cm/s MV E/A ratio:  1.02 Fransico Him MD Electronically signed by Fransico Him MD Signature Date/Time: 02/11/2021/1:42:44 PM    Final     Review of Systems  Constitutional: Negative.   HENT: Positive for dental problem.   Eyes: Negative.   Respiratory: Negative.   Cardiovascular: Negative.   Gastrointestinal: Positive for abdominal pain (right abdomen). Negative for constipation and diarrhea.  Endocrine: Negative.   Genitourinary: Positive for difficulty urinating.  Musculoskeletal: Positive for arthralgias and myalgias.  Skin: Negative.   Neurological: Positive for weakness (BLE upon admission, now RLE).  Psychiatric/Behavioral:       Substance abuse   Blood pressure (!) 185/100, pulse (!) 119, temperature (!) 100.7 F (38.2 C), temperature source Oral, resp. rate (!) 22, height '5\' 5"'  (1.651 m), weight 108 kg, SpO2 93 %. Physical Exam Constitutional:  General: She is in acute distress.     Appearance: She is well-developed. She is ill-appearing. She is not toxic-appearing.  HENT:     Head: No raccoon eyes or Battle's sign.     Comments: Poor dentition     Mouth/Throat:     Mouth: Mucous membranes are moist.     Pharynx: No oropharyngeal exudate.  Eyes:     Extraocular Movements: Extraocular movements intact.     Pupils: Pupils are equal, round, and reactive to light.  Neck:     Thyroid: No thyromegaly.  Cardiovascular:     Rate and Rhythm: Regular rhythm. Tachycardia present.     Pulses: Normal pulses.     Heart sounds: Normal  heart sounds. No murmur heard. No gallop.   Pulmonary:     Effort: Pulmonary effort is normal. No tachypnea or bradypnea.     Breath sounds: Normal breath sounds.  Abdominal:     General: Bowel sounds are normal.     Palpations: Abdomen is soft. There is no hepatomegaly, splenomegaly or mass.     Tenderness: There is abdominal tenderness (RUQ and right lateral abdomen). There is no guarding or rebound.     Comments: Felt reduction of umbilical hernia, bilious NGT output  Musculoskeletal:     Cervical back: Normal range of motion and neck supple.  Lymphadenopathy:     Cervical: No cervical adenopathy.  Skin:    General: Skin is warm and dry.     Capillary Refill: Capillary refill takes more than 3 seconds.     Coloration: Skin is pale. Skin is not cyanotic.     Findings: No ecchymosis, erythema or rash.     Nails: There is no clubbing.  Neurological:     General: No focal deficit present.     Mental Status: She is alert and oriented to person, place, and time.  Psychiatric:        Mood and Affect: Mood is anxious.     Assessment/Plan: Right sided abdominal pain Umbilical hernia - intermittent incarceration with pSBO Gallstones Elevated transaminases Substance abuse Rhabdomyolysis with hyperkalemia ARF with CRRT initially, likely to start HD tomorrow. Leukocytosis RLE weakness. Secondary to lumbar spine stenosis.    Pt will need hernia fixed. Will address timing with DOW tomorrow. May also evaluate GB intraoperatively given severity of pain.  Not sure that HIDA would be helpful in setting of some shock liver.   Continue NGT/NPO   Stark Klein 02/12/2021, 6:13 PM

## 2021-02-12 NOTE — Progress Notes (Signed)
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

## 2021-02-13 ENCOUNTER — Inpatient Hospital Stay (HOSPITAL_COMMUNITY): Payer: Medicaid - Out of State

## 2021-02-13 DIAGNOSIS — M6282 Rhabdomyolysis: Secondary | ICD-10-CM | POA: Diagnosis not present

## 2021-02-13 DIAGNOSIS — R29898 Other symptoms and signs involving the musculoskeletal system: Secondary | ICD-10-CM

## 2021-02-13 DIAGNOSIS — Z452 Encounter for adjustment and management of vascular access device: Secondary | ICD-10-CM | POA: Diagnosis not present

## 2021-02-13 DIAGNOSIS — N179 Acute kidney failure, unspecified: Secondary | ICD-10-CM | POA: Diagnosis not present

## 2021-02-13 LAB — CBC WITH DIFFERENTIAL/PLATELET
Abs Immature Granulocytes: 1.49 10*3/uL — ABNORMAL HIGH (ref 0.00–0.07)
Basophils Absolute: 0.1 10*3/uL (ref 0.0–0.1)
Basophils Relative: 0 %
Eosinophils Absolute: 0.2 10*3/uL (ref 0.0–0.5)
Eosinophils Relative: 1 %
HCT: 24.3 % — ABNORMAL LOW (ref 36.0–46.0)
Hemoglobin: 7.9 g/dL — ABNORMAL LOW (ref 12.0–15.0)
Immature Granulocytes: 7 %
Lymphocytes Relative: 25 %
Lymphs Abs: 5.6 10*3/uL — ABNORMAL HIGH (ref 0.7–4.0)
MCH: 29.3 pg (ref 26.0–34.0)
MCHC: 32.5 g/dL (ref 30.0–36.0)
MCV: 90 fL (ref 80.0–100.0)
Monocytes Absolute: 2.3 10*3/uL — ABNORMAL HIGH (ref 0.1–1.0)
Monocytes Relative: 10 %
Neutro Abs: 12.9 10*3/uL — ABNORMAL HIGH (ref 1.7–7.7)
Neutrophils Relative %: 57 %
Platelets: 145 10*3/uL — ABNORMAL LOW (ref 150–400)
RBC: 2.7 MIL/uL — ABNORMAL LOW (ref 3.87–5.11)
RDW: 14.3 % (ref 11.5–15.5)
WBC: 22.6 10*3/uL — ABNORMAL HIGH (ref 4.0–10.5)
nRBC: 0.4 % — ABNORMAL HIGH (ref 0.0–0.2)

## 2021-02-13 LAB — RENAL FUNCTION PANEL
Albumin: 2.3 g/dL — ABNORMAL LOW (ref 3.5–5.0)
Albumin: 2.2 g/dL — ABNORMAL LOW (ref 3.5–5.0)
Anion gap: 10 (ref 5–15)
Anion gap: 10 (ref 5–15)
BUN: 35 mg/dL — ABNORMAL HIGH (ref 6–20)
BUN: 33 mg/dL — ABNORMAL HIGH (ref 6–20)
CO2: 24 mmol/L (ref 22–32)
CO2: 25 mmol/L (ref 22–32)
Calcium: 7.5 mg/dL — ABNORMAL LOW (ref 8.9–10.3)
Calcium: 7.5 mg/dL — ABNORMAL LOW (ref 8.9–10.3)
Chloride: 99 mmol/L (ref 98–111)
Chloride: 100 mmol/L (ref 98–111)
Creatinine, Ser: 4.22 mg/dL — ABNORMAL HIGH (ref 0.44–1.00)
Creatinine, Ser: 3.91 mg/dL — ABNORMAL HIGH (ref 0.44–1.00)
GFR, Estimated: 12 mL/min — ABNORMAL LOW (ref >60)
GFR, Estimated: 13 mL/min — ABNORMAL LOW (ref >60)
Glucose, Bld: 116 mg/dL — ABNORMAL HIGH (ref 70–99)
Glucose, Bld: 103 mg/dL — ABNORMAL HIGH (ref 70–99)
Phosphorus: 4.4 mg/dL (ref 2.5–4.6)
Phosphorus: 3.6 mg/dL (ref 2.5–4.6)
Potassium: 4.6 mmol/L (ref 3.5–5.1)
Potassium: 4.6 mmol/L (ref 3.5–5.1)
Sodium: 133 mmol/L — ABNORMAL LOW (ref 135–145)
Sodium: 135 mmol/L (ref 135–145)

## 2021-02-13 LAB — MAGNESIUM: Magnesium: 2.7 mg/dL — ABNORMAL HIGH (ref 1.7–2.4)

## 2021-02-13 LAB — COMPREHENSIVE METABOLIC PANEL
ALT: 239 U/L — ABNORMAL HIGH (ref 0–44)
AST: 528 U/L — ABNORMAL HIGH (ref 15–41)
Albumin: 2.3 g/dL — ABNORMAL LOW (ref 3.5–5.0)
Alkaline Phosphatase: 99 U/L (ref 38–126)
Anion gap: 9 (ref 5–15)
BUN: 34 mg/dL — ABNORMAL HIGH (ref 6–20)
CO2: 24 mmol/L (ref 22–32)
Calcium: 7.5 mg/dL — ABNORMAL LOW (ref 8.9–10.3)
Chloride: 99 mmol/L (ref 98–111)
Creatinine, Ser: 4.34 mg/dL — ABNORMAL HIGH (ref 0.44–1.00)
GFR, Estimated: 12 mL/min — ABNORMAL LOW (ref >60)
Glucose, Bld: 116 mg/dL — ABNORMAL HIGH (ref 70–99)
Potassium: 4.6 mmol/L (ref 3.5–5.1)
Sodium: 132 mmol/L — ABNORMAL LOW (ref 135–145)
Total Bilirubin: 0.5 mg/dL (ref 0.3–1.2)
Total Protein: 6.1 g/dL — ABNORMAL LOW (ref 6.5–8.1)

## 2021-02-13 LAB — APTT: aPTT: 38 seconds — ABNORMAL HIGH (ref 24–36)

## 2021-02-13 IMAGING — DX DG CHEST 1V PORT
1 series · 1 of 1 positions shown · non-contrast
Comparison: [DATE] and CT chest [DATE].

CLINICAL DATA: Central line placement.

EXAM:
PORTABLE CHEST 1 VIEW

[chest ap]
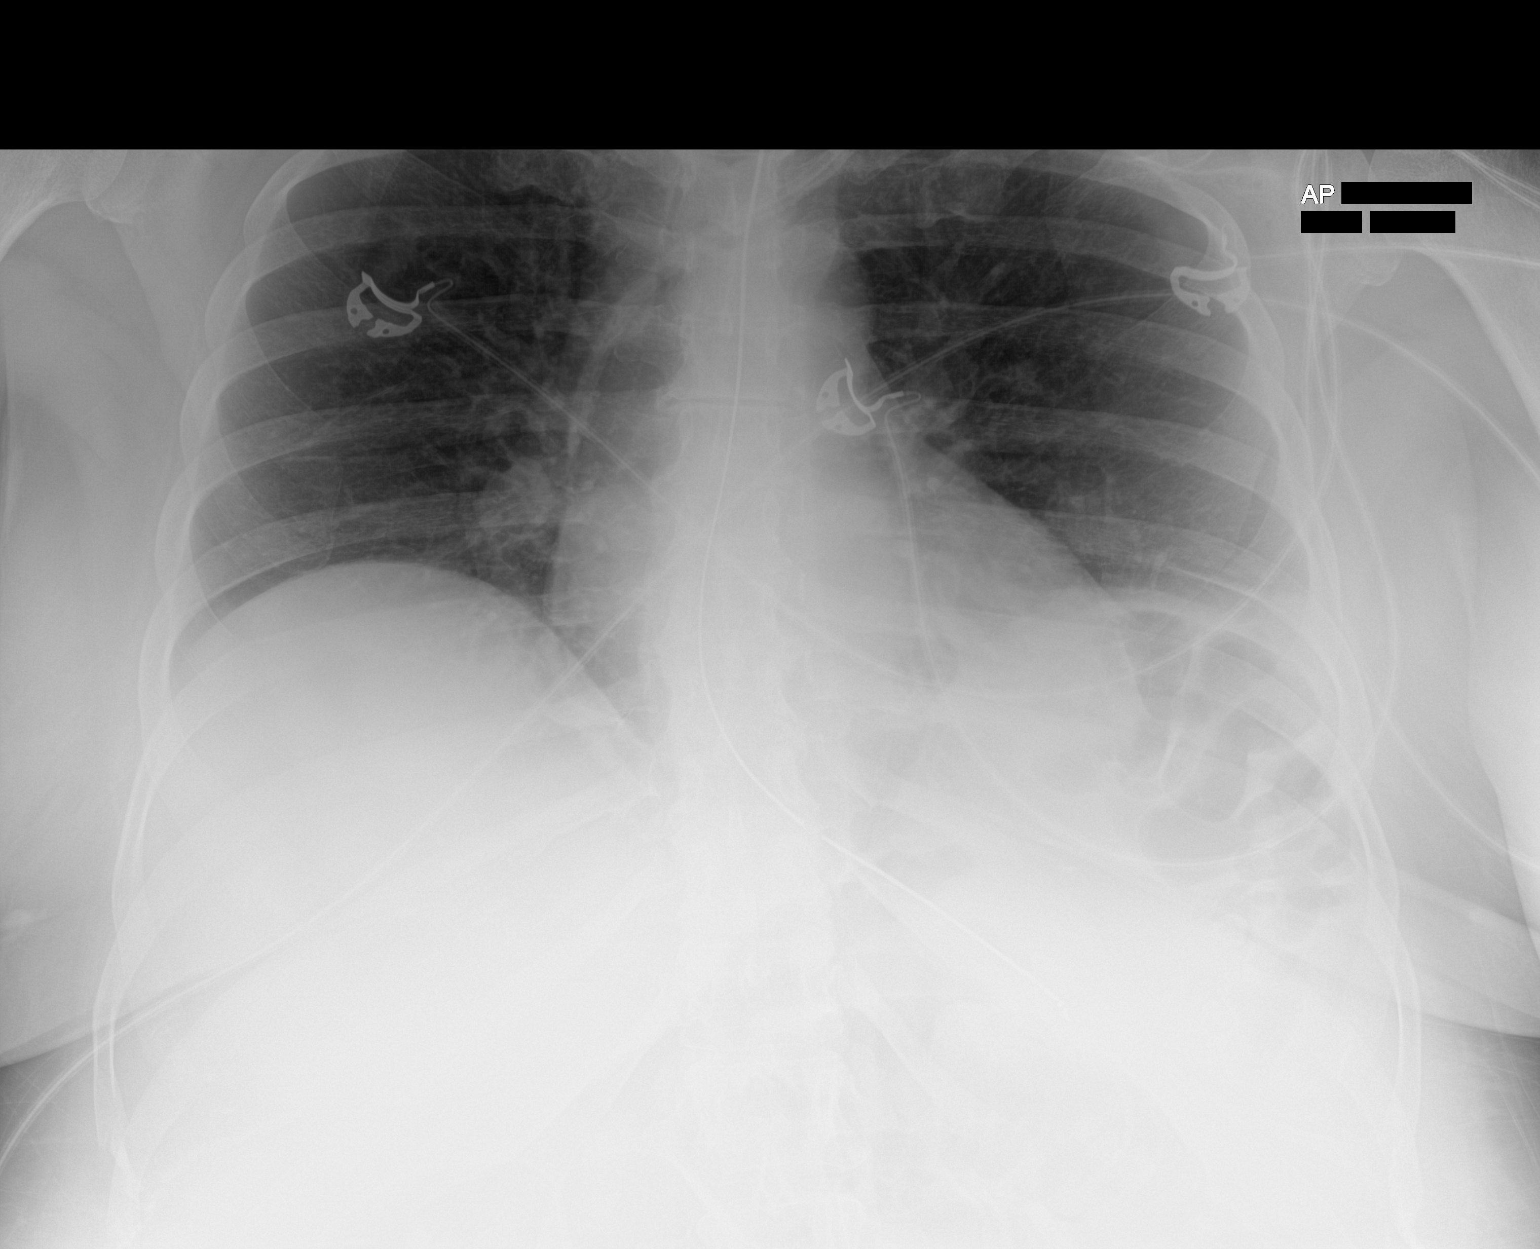

[1 of 1 positions shown; findings below may reference images not displayed]

FINDINGS: Removal of right IJ catheter with placement of a left IJ catheter.
Tip is in the low SVC. Nasogastric tube terminates in the stomach
with the side port in the region of the gastroesophageal junction.

Heart size is accentuated by AP semi upright technique and low lung
volumes. Minimal streaky atelectasis in the left lung base. No
definite pleural fluid. No pneumothorax.
IMPRESSION: 1. Interval left IJ central line placement without complicating
feature.
2. Nasogastric tube could be advanced several cm to better position
the side port below the gastroesophageal junction, as clinically
indicated.
3. Low lung volumes with minimal left basilar subsegmental
atelectasis

## 2021-02-13 IMAGING — US US ABDOMEN LIMITED RUQ/ASCITES
1 series · 14 of 25 positions shown · non-contrast
Comparison: CT abdomen pelvis [DATE].

CLINICAL DATA: Acute cholecystitis.

EXAM:
ULTRASOUND ABDOMEN LIMITED RIGHT UPPER QUADRANT

[Series 1: us abdomen limited ruq/ascites · 14 of 89 slices shown]
[im 1/89]
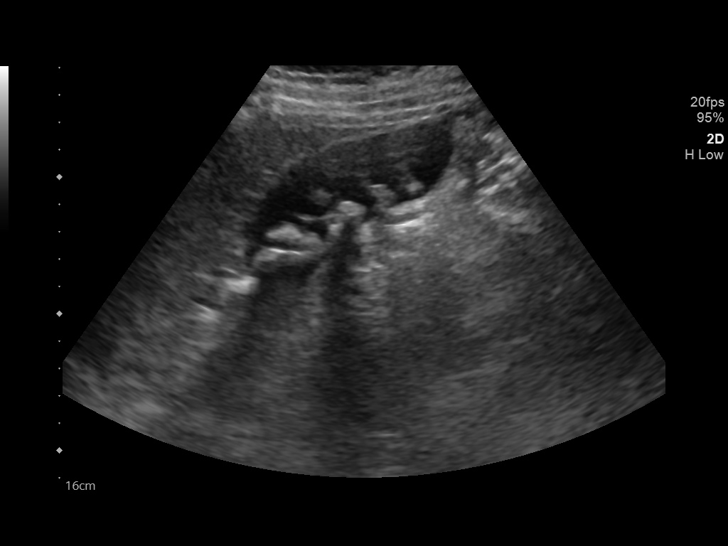
[im 8/89]
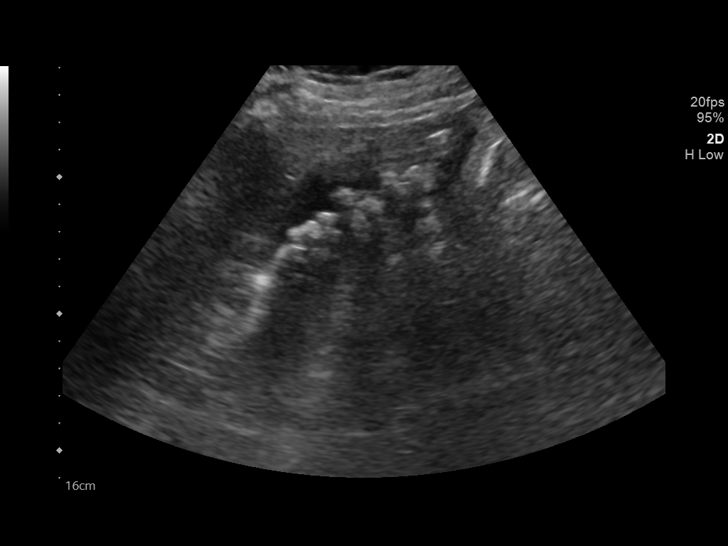
[im 15/89]
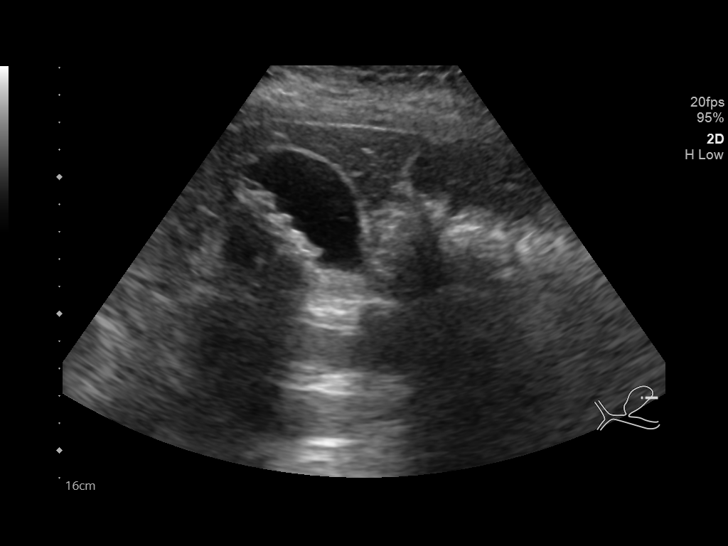
[im 23/89]
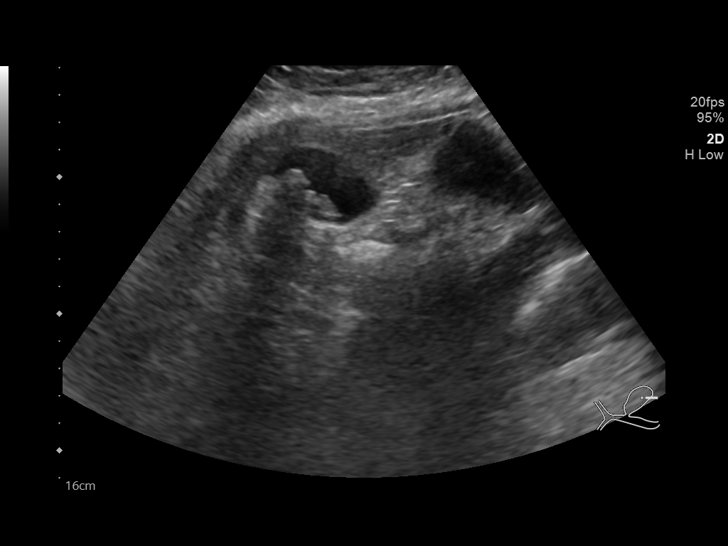
[im 30/89]
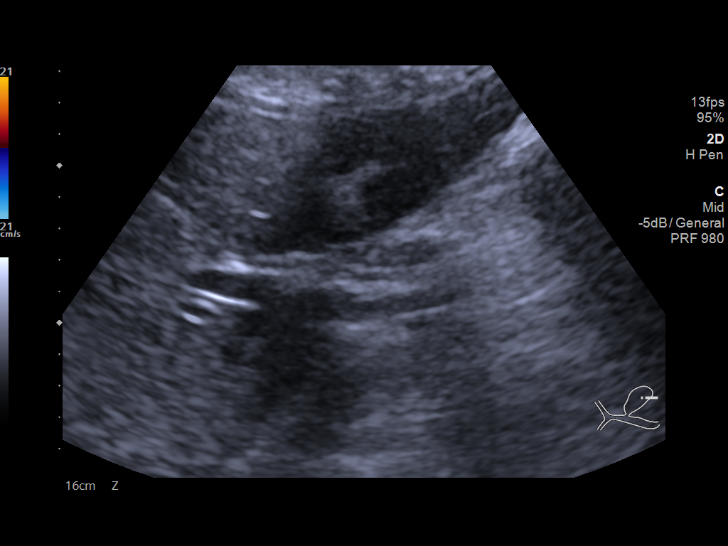
[im 34/89]
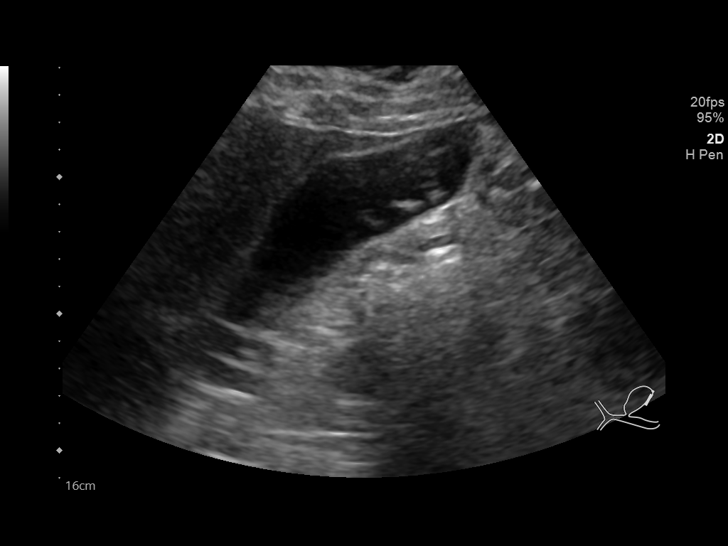
[im 41/89]
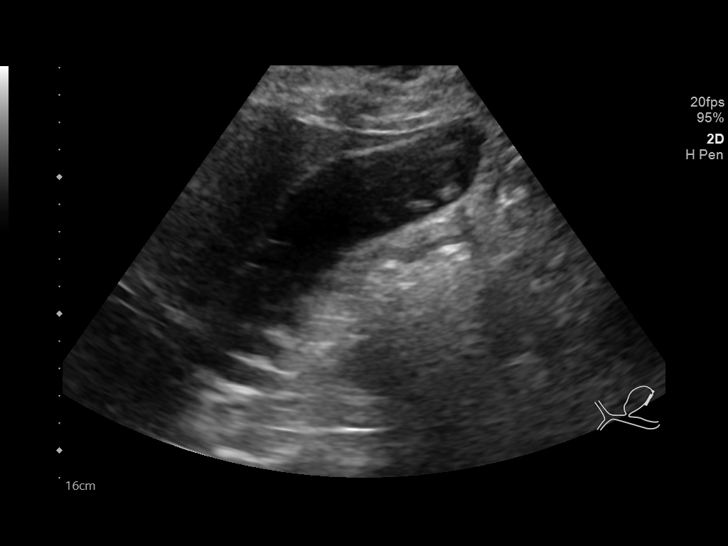
[im 48/89]
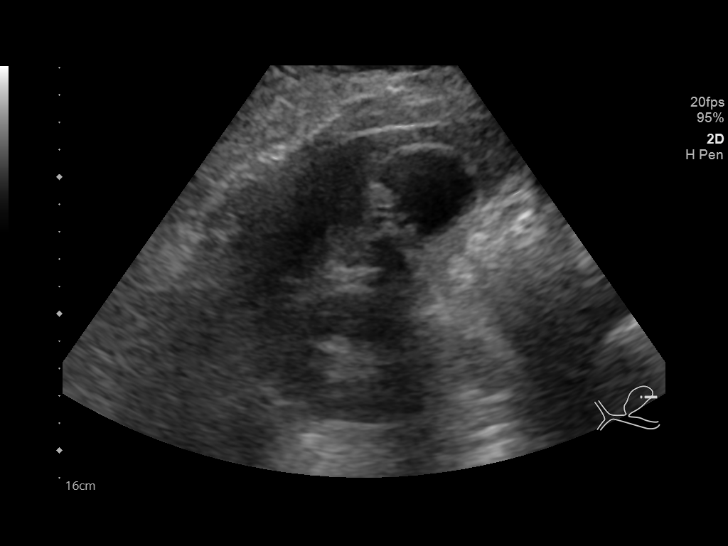
[im 56/89]
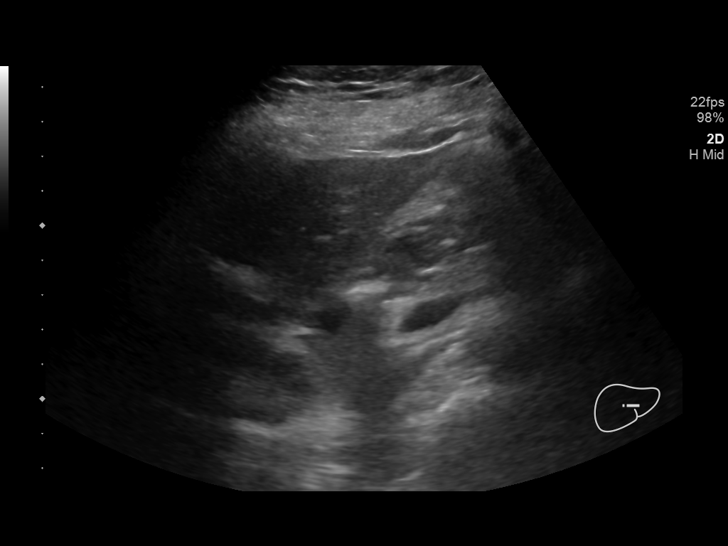
[im 59/89]
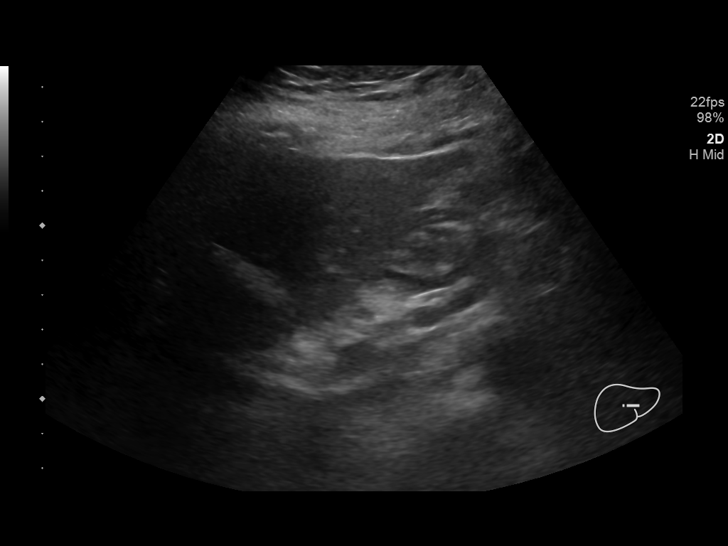
[im 67/89]
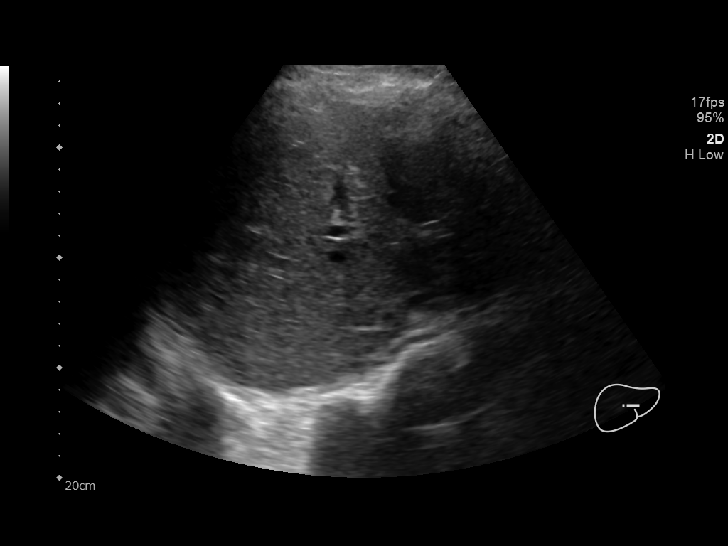
[im 74/89]
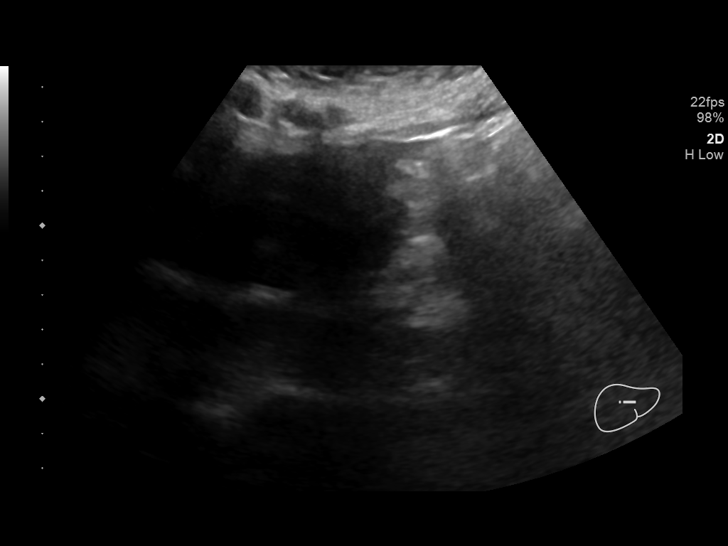
[im 81/89]
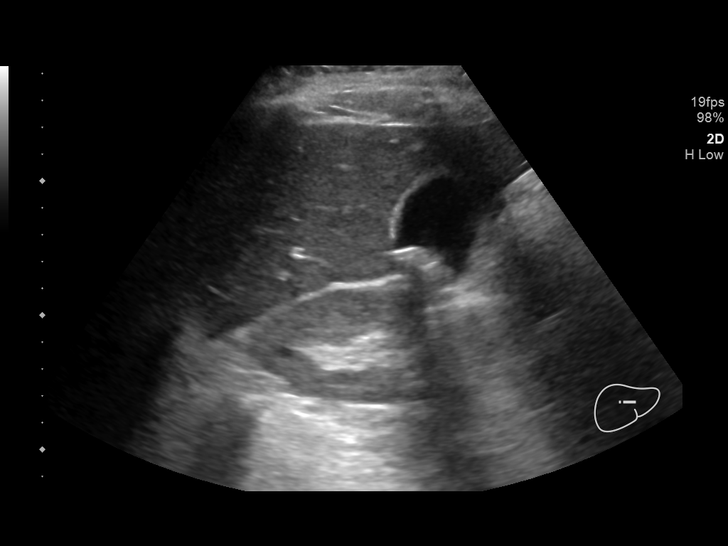
[im 89/89]
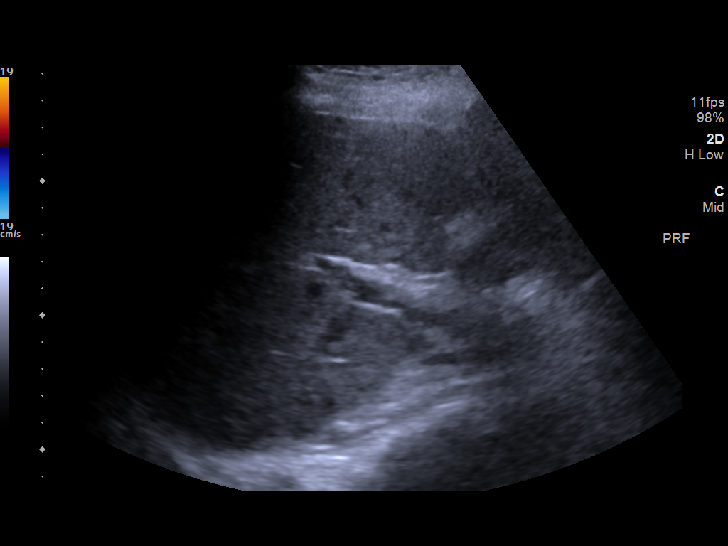

[14 of 25 positions shown; findings below may reference images not displayed]

FINDINGS: Gallbladder:

Numerous shadowing echogenic stones in the gallbladder, measuring up
to 1.2 cm. Sludge is seen as well. No wall thickening. Negative
sonographic Murphy sign.

Common bile duct:

Diameter: Suboptimally visualized due to body habitus, measures
approximately 3 mm.

Liver:

Suboptimally visualized due to body habitus. Diffusely increased in
echogenicity. No definite focal lesion. Portal vein is patent on
color Doppler imaging with normal direction of blood flow towards
the liver.

Other: None.
IMPRESSION: 1. Cholelithiasis and sludge.  No evidence of acute cholecystitis.
2. Hepatic steatosis.

## 2021-02-13 IMAGING — MR MR THORACIC SPINE W/O CM
15 of 16 series · 37 of 48 positions shown · non-contrast
Comparison: None.

CLINICAL DATA: Myelopathy, acute or progressive. History of drug
abuse.

EXAM:
MRI THORACIC SPINE WITHOUT CONTRAST
TECHNIQUE: Multiplanar, multisequence MR imaging of the thoracic spine was
performed. No intravenous contrast was administered.

[Series 5: T1 · sagittal · 3.0mm · 0.69mm/px · 1 of 15 slices shown (1 of 6)]
[im 1/15]
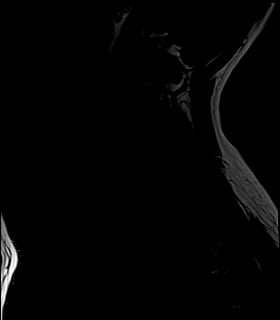

[Series 6: T2 · sagittal · 3.0mm · 0.69mm/px · 2 of 15 slices shown (1 of 5)]
[im 1/15]
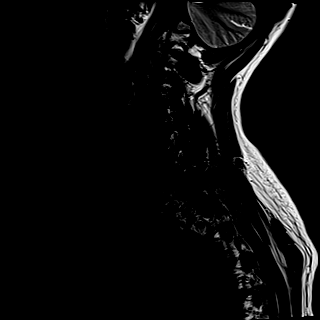
[im 15/15]
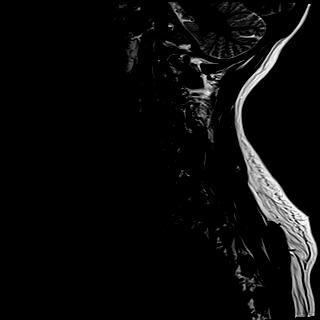

[Series 7: STIR · sagittal · 3.0mm · 0.86mm/px · 2 of 15 slices shown (1 of 3)]
[im 1/15]
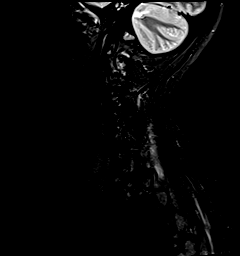
[im 15/15]
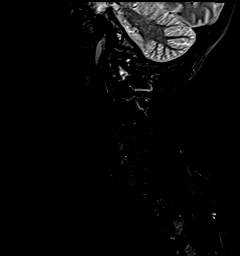

[Series 8: T2 · axial · 3.0mm · 0.70mm/px · z∈[-40,+61]mm · 3 of 30 slices shown (2 of 5)]
[im 1/30]
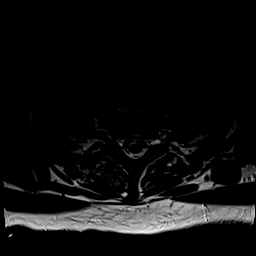
[im 15/30]
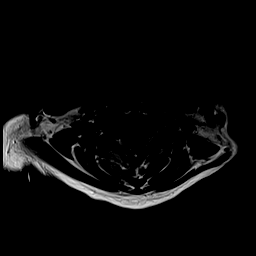
[im 30/30]
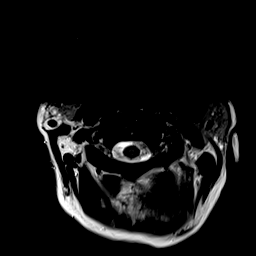

[Series 9: GRE · axial · 3.0mm · 0.47mm/px · z∈[-40,+10]mm · 2 of 31 slices shown]
[im 1/31]
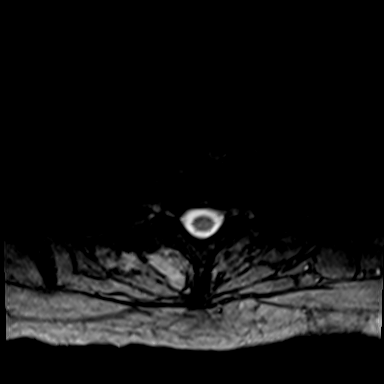
[im 16/31]
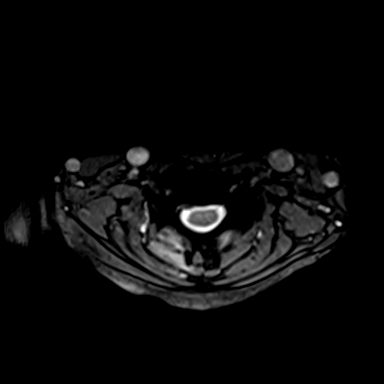

[Series 25: T1 · sagittal · 4.0mm · 1.72mm/px · 1 of 5 slices shown (2 of 6)]
[im 1/5]
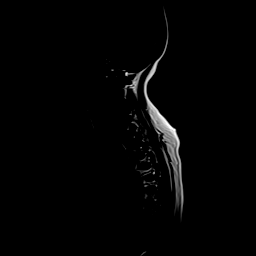

[Series 26: STIR · sagittal · 3.0mm · 1.03mm/px · 2 of 15 slices shown (2 of 3)]
[im 1/15]
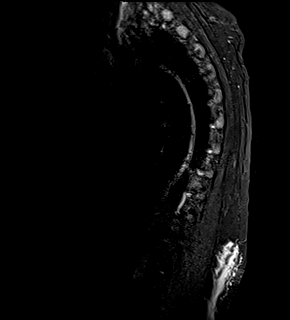
[im 15/15]
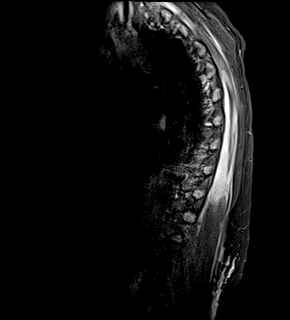

[Series 27: T1 · sagittal · 3.0mm · 1.00mm/px · 2 of 15 slices shown (3 of 6)]
[im 1/15]
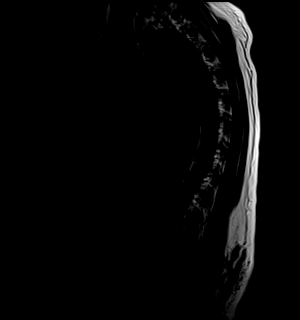
[im 15/15]
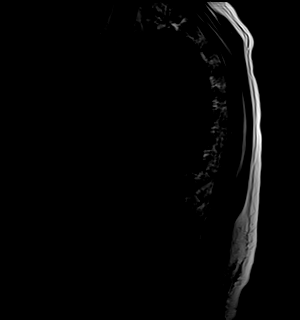

[Series 28: T2 · sagittal · 3.0mm · 0.83mm/px · 2 of 15 slices shown (3 of 5)]
[im 1/15]
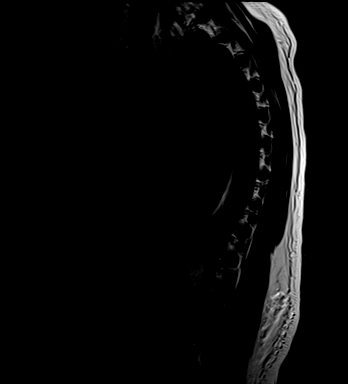
[im 15/15]
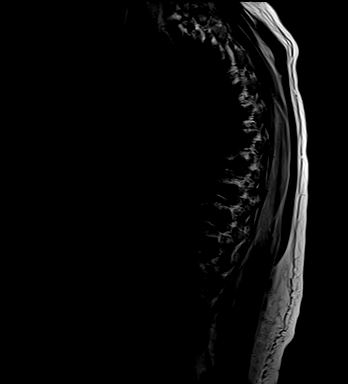

[Series 29: T2 · axial · 4.0mm · 0.78mm/px · z∈[-242,-14]mm · 6 of 55 slices shown (4 of 5)]
[im 1/55]
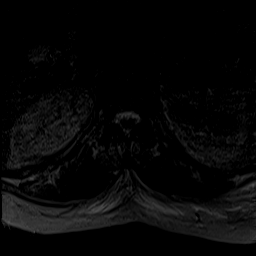
[im 11/55]
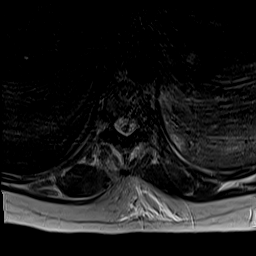
[im 22/55]
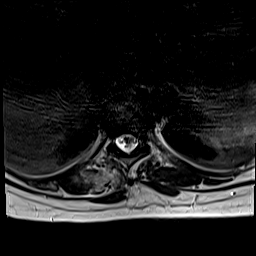
[im 33/55]
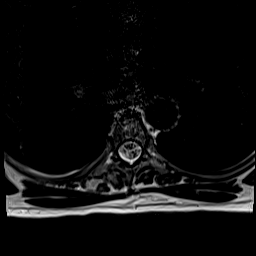
[im 44/55]
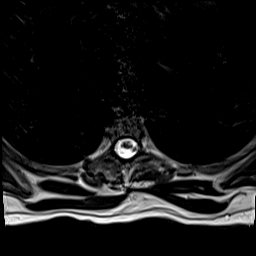
[im 55/55]
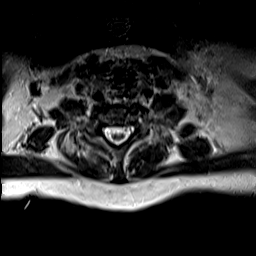

[Series 31: T1 · axial · 4.0mm · 0.39mm/px · z∈[-242,-14]mm · 6 of 55 slices shown (4 of 6)]
[im 1/55]
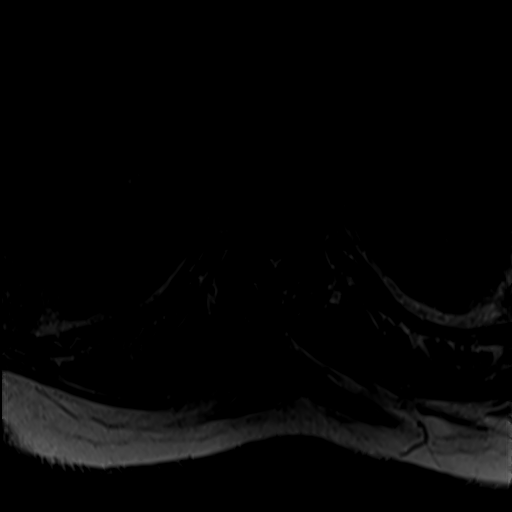
[im 11/55]
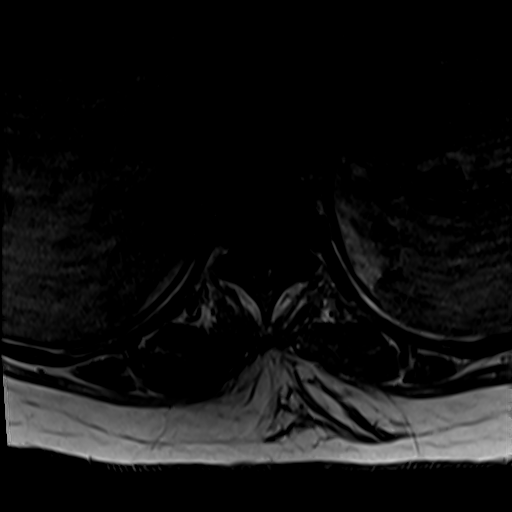
[im 22/55]
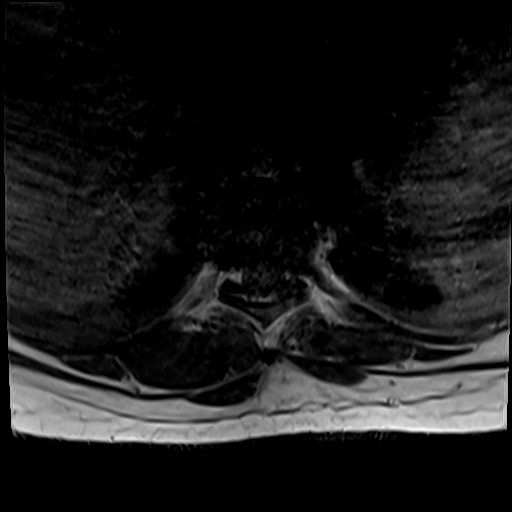
[im 33/55]
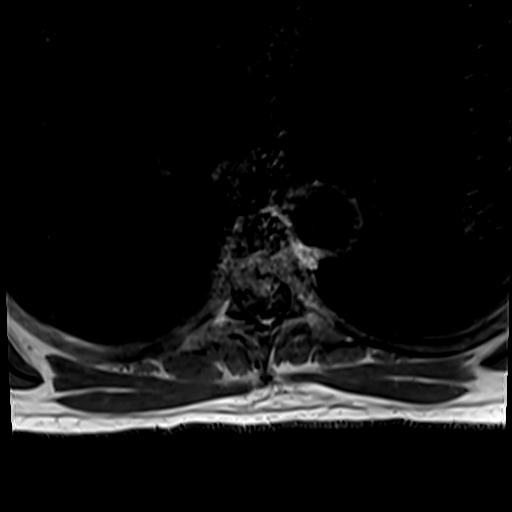
[im 44/55]
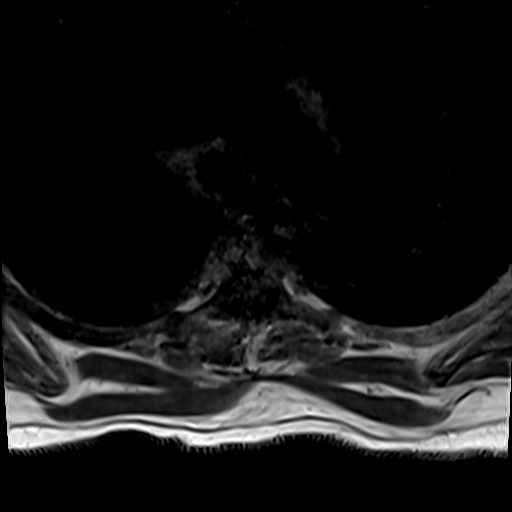
[im 55/55]
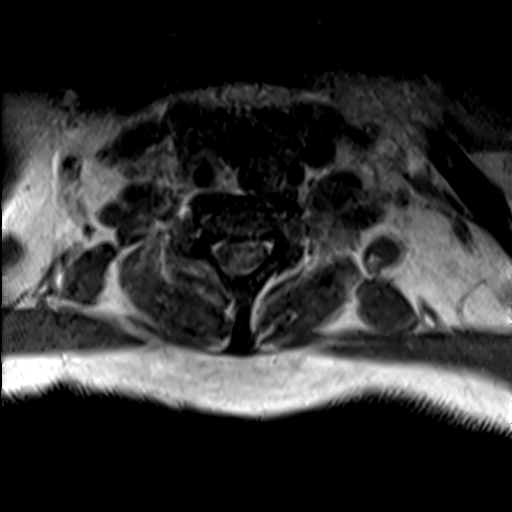

[Series 36: T1 · sagittal · 4.0mm · 0.81mm/px · 2 of 17 slices shown (5 of 6)]
[im 1/17]
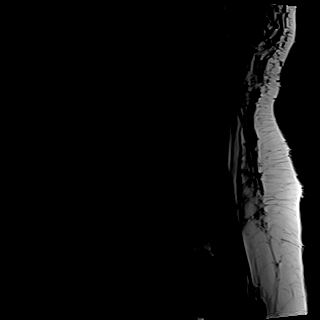
[im 17/17]
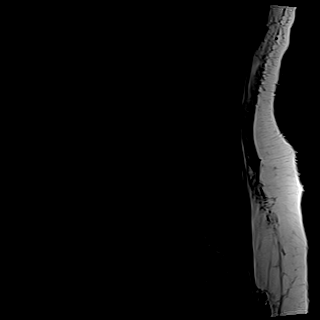

[Series 37: T2 · sagittal · 4.0mm · 0.81mm/px · 2 of 17 slices shown (5 of 5)]
[im 1/17]
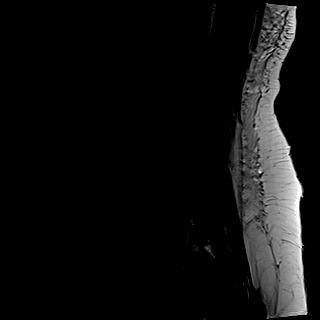
[im 17/17]
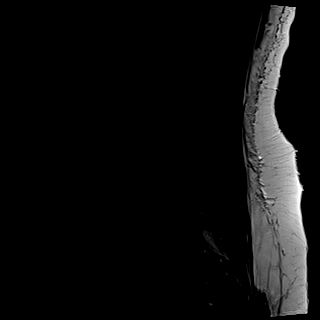

[Series 38: T1 · sagittal · 4.0mm · 0.81mm/px · 2 of 17 slices shown (6 of 6)]
[im 1/17]
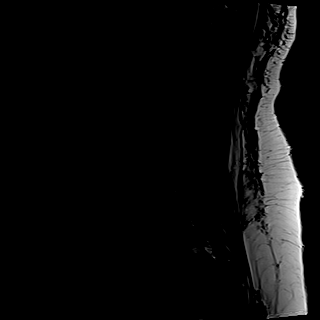
[im 17/17]
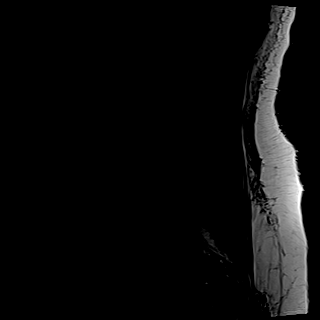

[Series 39: STIR · sagittal · 4.0mm · 0.51mm/px · 2 of 17 slices shown (3 of 3)]
[im 1/17]
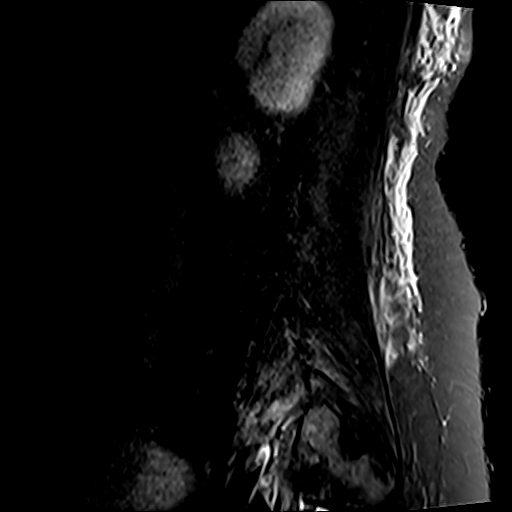
[im 17/17]
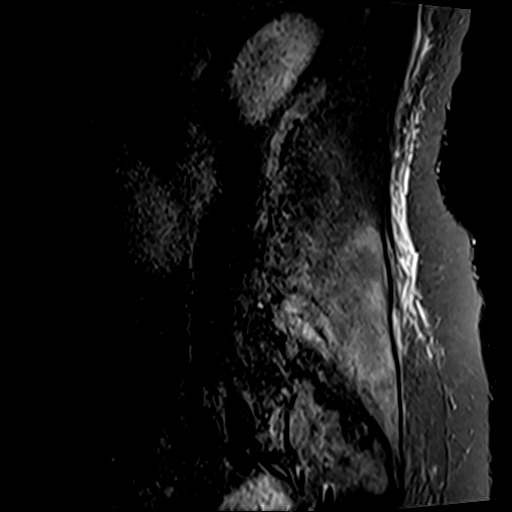

[37 of 48 positions shown; findings below may reference images not displayed]

FINDINGS: Alignment:  Normal alignment.  Mild thoracic kyphosis.

Vertebrae: Normal bone marrow. Negative for fracture, mass, or bone
infection. No evidence of discitis.

Cord: Cord evaluation limited by motion. There is significant CSF
flow artifact in the spinal canal. Allowing for this, no suspicious
cord lesion identified.

Paraspinal and other soft tissues: Extensive edema in the right
paraspinous muscles. This is only seen on the right side. Diffuse
low-density edema is present without focal fluid collection. This
extends into the cervical spine as described separately.

Disc levels:

Multilevel disc degeneration with disc space narrowing and mild
spurring throughout much of the thoracic spine. No focal disc
protrusion or significant spinal stenosis.
IMPRESSION: Extensive muscle edema on the right involving the posterior
paraspinous muscles. Findings most compatible with myositis. No
abscess identified on unenhanced images.

Motion degraded study.

## 2021-02-13 IMAGING — MR MR CERVICAL SPINE W/O CM
16 of 17 series · 37 of 48 positions shown · non-contrast
Comparison: None.

CLINICAL DATA: Myelopathy, acute or progressive. Suspect infection.
Drug abuse.

EXAM:
MRI CERVICAL SPINE WITHOUT CONTRAST
TECHNIQUE: Multiplanar, multisequence MR imaging of the cervical spine was
performed. No intravenous contrast was administered.

[Series 5: T1 · sagittal · 3.0mm · 0.69mm/px · 1 of 15 slices shown (1 of 7)]
[im 1/15]
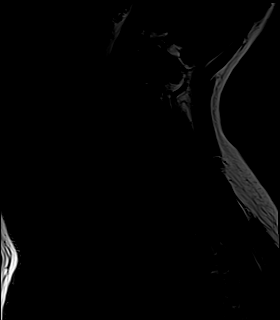

[Series 6: T2 · sagittal · 3.0mm · 0.69mm/px · 1 of 15 slices shown (1 of 5)]
[im 1/15]
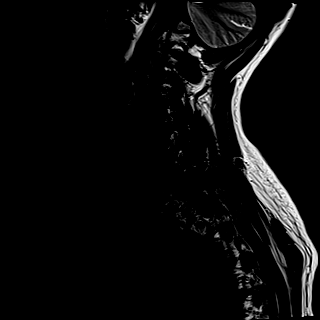

[Series 7: STIR · sagittal · 3.0mm · 0.86mm/px · 2 of 15 slices shown (1 of 3)]
[im 1/15]
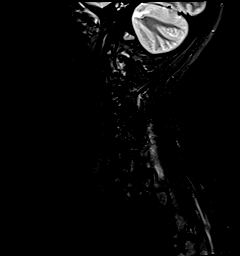
[im 15/15]
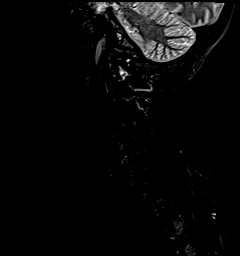

[Series 8: T2 · axial · 3.0mm · 0.70mm/px · z∈[-40,+61]mm · 3 of 30 slices shown (2 of 5)]
[im 1/30]
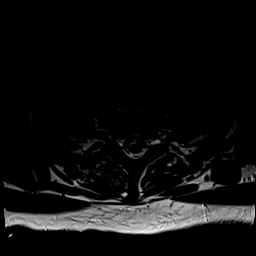
[im 15/30]
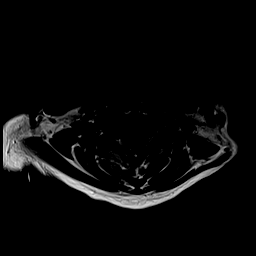
[im 30/30]
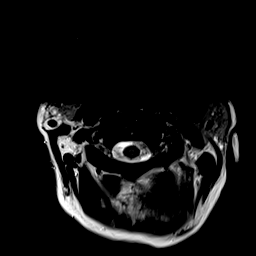

[Series 9: GRE · axial · 3.0mm · 0.47mm/px · 1 of 31 slices shown]
[im 1/31]
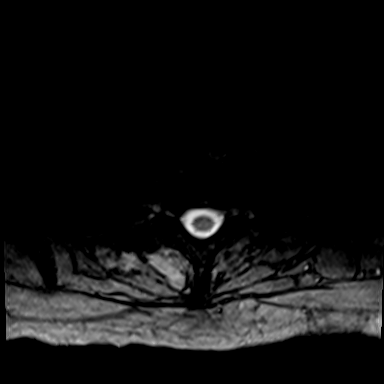

[Series 25: T1 · sagittal · 4.0mm · 1.72mm/px · 1 of 5 slices shown (2 of 7)]
[im 1/5]
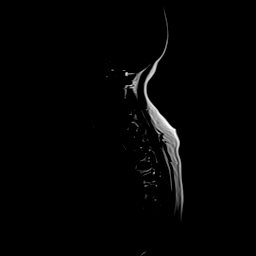

[Series 26: STIR · sagittal · 3.0mm · 1.03mm/px · 2 of 15 slices shown (2 of 3)]
[im 1/15]
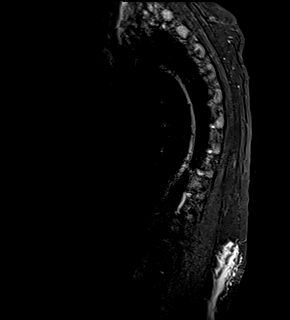
[im 15/15]
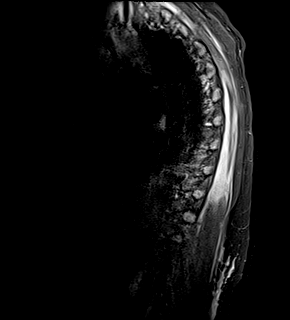

[Series 27: T1 · sagittal · 3.0mm · 1.00mm/px · 2 of 15 slices shown (3 of 7)]
[im 1/15]
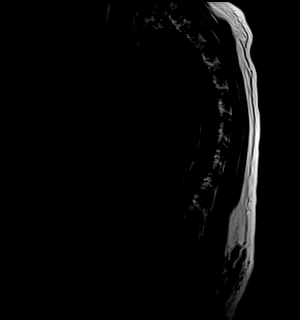
[im 15/15]
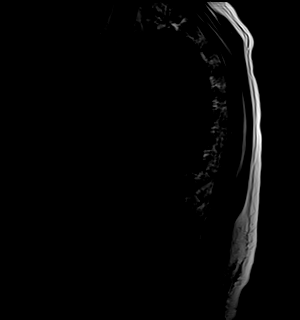

[Series 28: T2 · sagittal · 3.0mm · 0.83mm/px · 2 of 15 slices shown (3 of 5)]
[im 1/15]
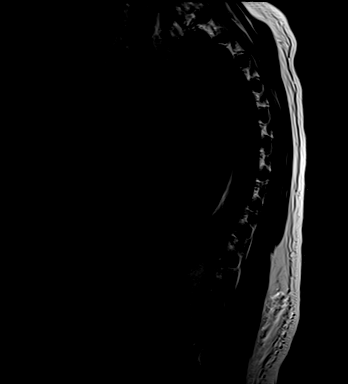
[im 15/15]
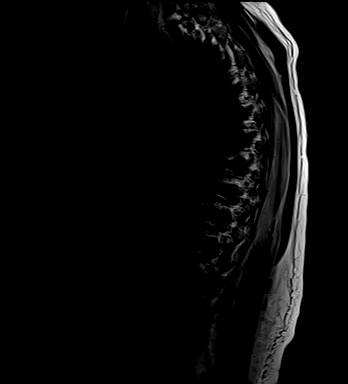

[Series 29: T2 · axial · 4.0mm · 0.78mm/px · z∈[-242,-14]mm · 6 of 55 slices shown (4 of 5)]
[im 1/55]
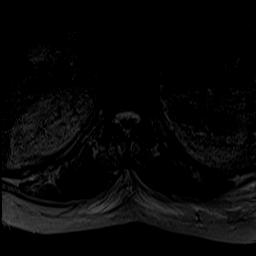
[im 11/55]
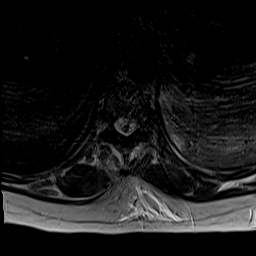
[im 22/55]
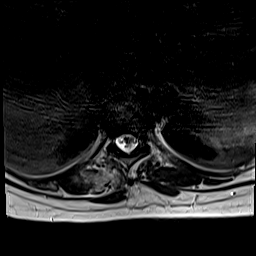
[im 33/55]
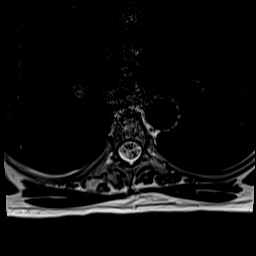
[im 44/55]
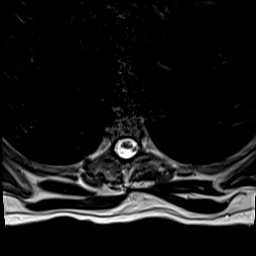
[im 55/55]
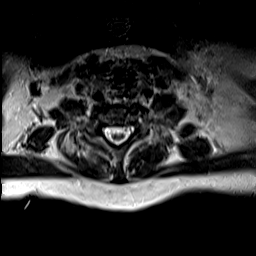

[Series 30: T1 · sagittal · 3.0mm · 1.00mm/px · 2 of 15 slices shown (4 of 7)]
[im 1/15]
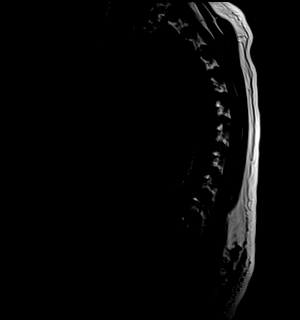
[im 15/15]
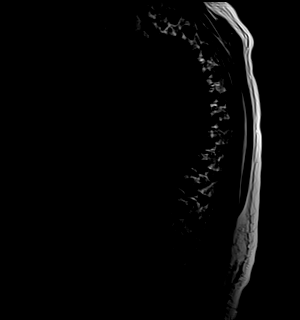

[Series 31: T1 · axial · 4.0mm · 0.39mm/px · z∈[-242,-14]mm · 6 of 55 slices shown (5 of 7)]
[im 1/55]
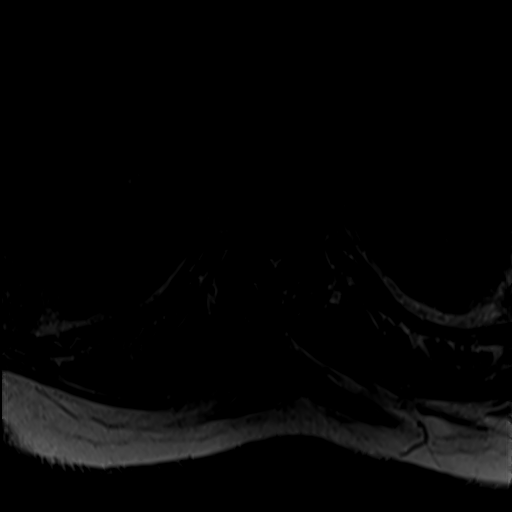
[im 11/55]
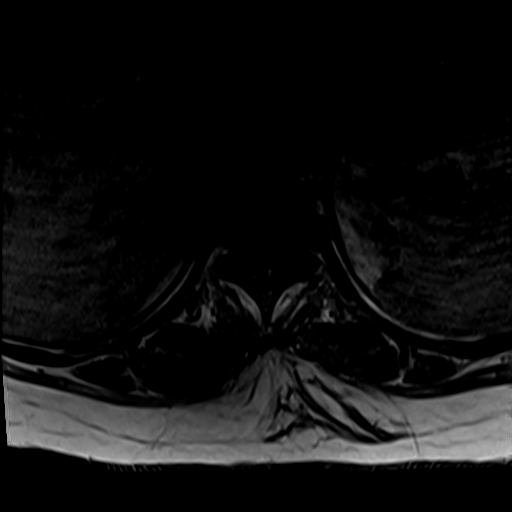
[im 22/55]
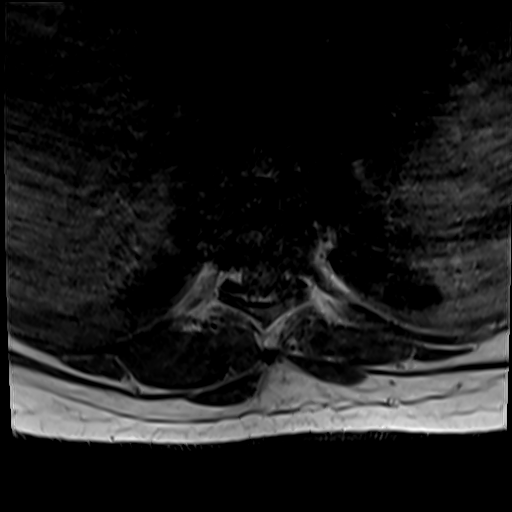
[im 33/55]
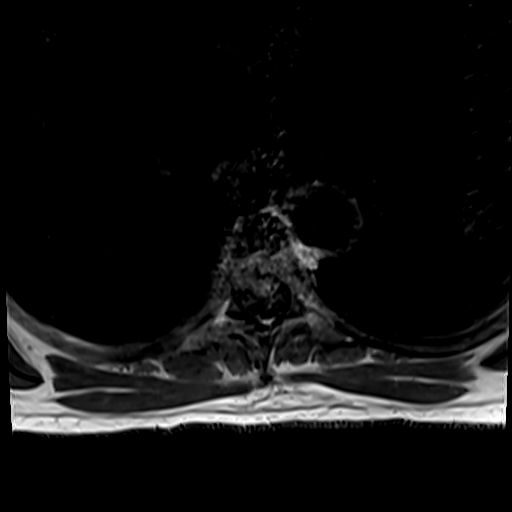
[im 44/55]
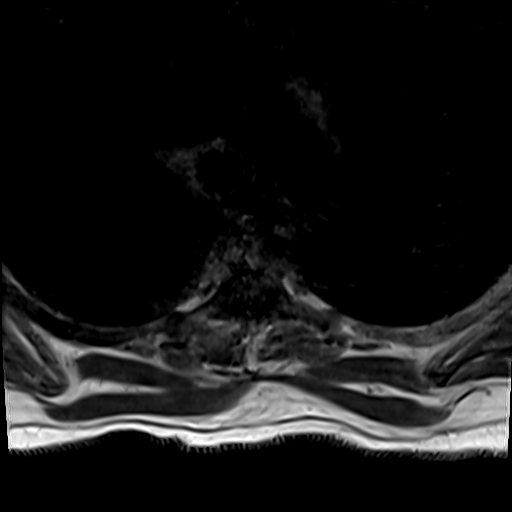
[im 55/55]
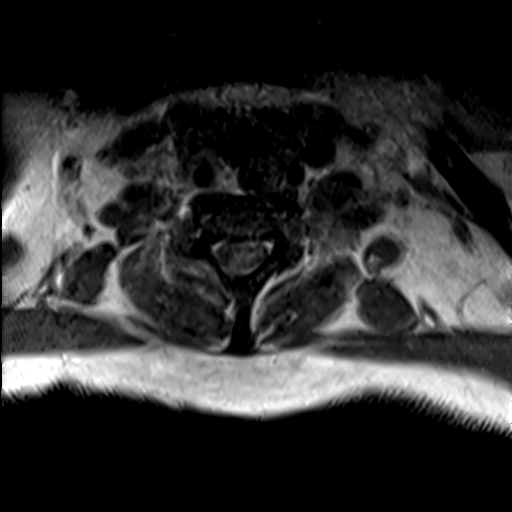

[Series 36: T1 · sagittal · 4.0mm · 0.81mm/px · 2 of 17 slices shown (6 of 7)]
[im 1/17]
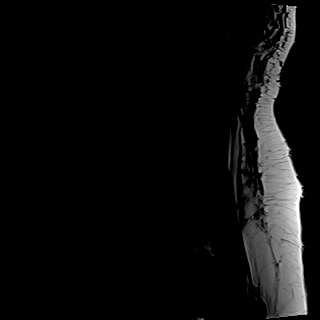
[im 17/17]
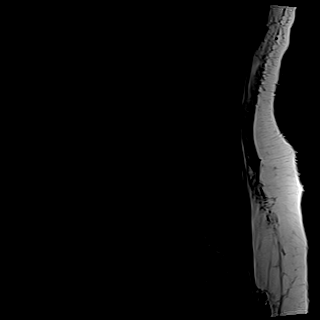

[Series 37: T2 · sagittal · 4.0mm · 0.81mm/px · 2 of 17 slices shown (5 of 5)]
[im 1/17]
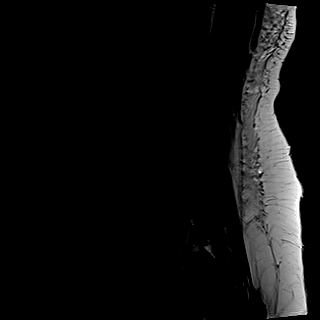
[im 17/17]
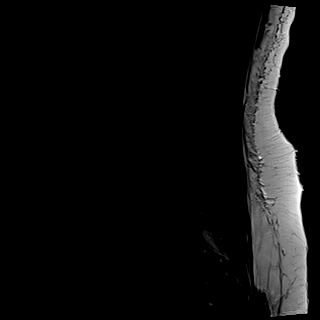

[Series 38: T1 · sagittal · 4.0mm · 0.81mm/px · 2 of 17 slices shown (7 of 7)]
[im 1/17]
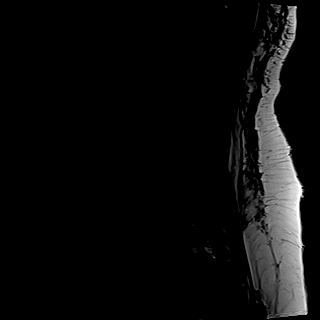
[im 17/17]
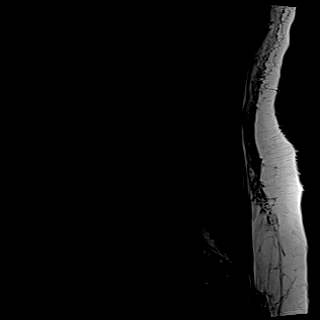

[Series 39: STIR · sagittal · 4.0mm · 0.51mm/px · 2 of 17 slices shown (3 of 3)]
[im 1/17]
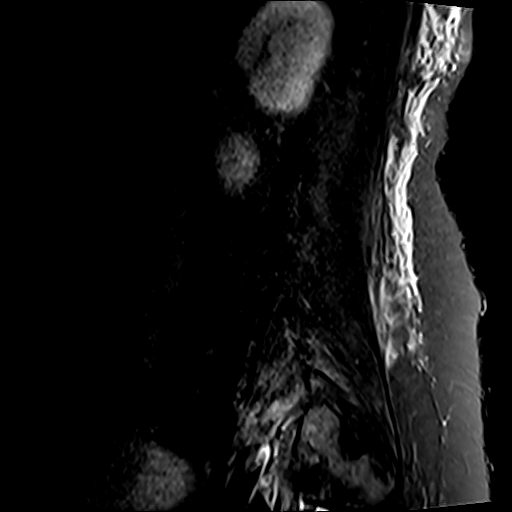
[im 17/17]
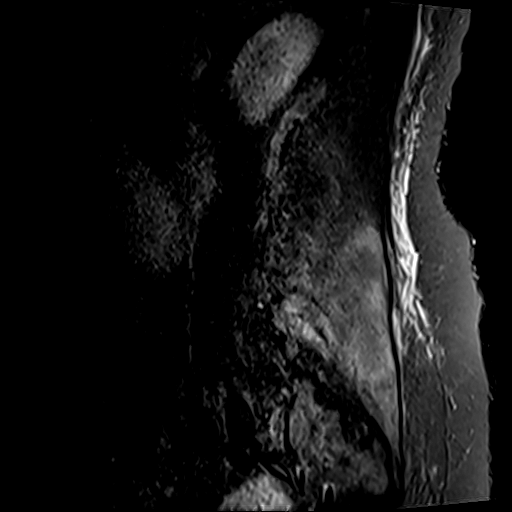

[37 of 48 positions shown; findings below may reference images not displayed]

FINDINGS: Alignment: Mild retrolisthesis C5-6 otherwise normal alignment.
Straightening of the cervical lordosis

Vertebrae: Negative for fracture or mass. No evidence of spinal
infection on unenhanced imaging.

Cord: Normal signal and morphology.

Posterior Fossa, vertebral arteries, paraspinal tissues: Edema in
the right posterior paraspinous muscles extending from C4 through
the upper thoracic spine. No focal fluid collection.

Disc levels:

C2-3: Mild left foraminal narrowing due to facet hypertrophy.

C3-4: Mild facet degeneration bilaterally.  Negative for stenosis

C4-5: Bilateral facet degeneration left greater than right. Disc
degeneration and uncinate spurring. Mild foraminal narrowing
bilaterally.

C5-6: Mild retrolisthesis. Disc degeneration with diffuse uncinate
spurring and bilateral facet degeneration. Moderate to severe
foraminal encroachment bilaterally right greater than left. Mild
spinal stenosis

C6-7: Mild facet degeneration bilaterally. Mild right foraminal
stenosis

C7-T1: Mild right foraminal narrowing due to facet degeneration.
IMPRESSION: Muscle edema in the right posterior paraspinous muscles without
fluid collection. This extends into the thoracic spine and likely is
due to myositis. No abscess

Degenerative changes in the thoracic spine. Multilevel spinal and
foraminal stenosis due to spurring.

## 2021-02-13 IMAGING — MR MR LUMBAR SPINE W/O CM
16 of 17 series · 37 of 48 positions shown · non-contrast
Comparison: CT lumbar spine [DATE]

CLINICAL DATA: Acute myelopathy.  History of drug abuse.

EXAM:
MRI LUMBAR SPINE WITHOUT CONTRAST
TECHNIQUE: Multiplanar, multisequence MR imaging of the lumbar spine was
performed. No intravenous contrast was administered.

[Series 5: T1 · sagittal · 3.0mm · 0.69mm/px · 1 of 15 slices shown (1 of 7)]
[im 1/15]
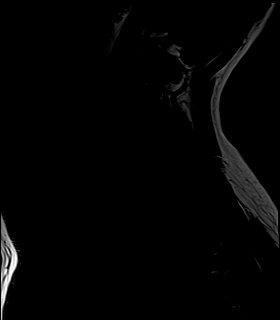

[Series 6: T2 · sagittal · 3.0mm · 0.69mm/px · 1 of 15 slices shown (1 of 5)]
[im 1/15]
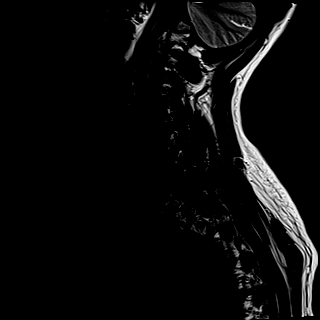

[Series 7: STIR · sagittal · 3.0mm · 0.86mm/px · 2 of 15 slices shown (1 of 3)]
[im 1/15]
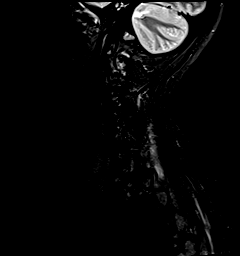
[im 15/15]
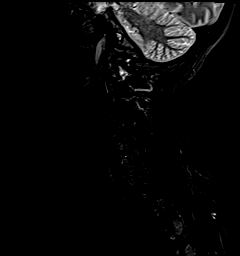

[Series 8: T2 · axial · 3.0mm · 0.70mm/px · z∈[-40,+61]mm · 3 of 30 slices shown (2 of 5)]
[im 1/30]
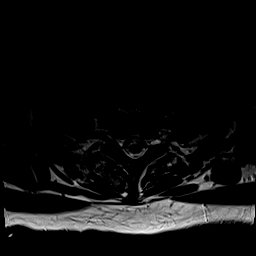
[im 15/30]
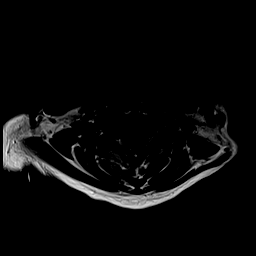
[im 30/30]
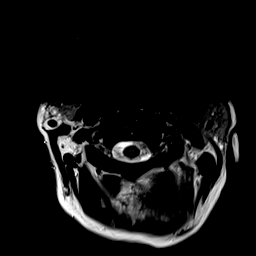

[Series 9: GRE · axial · 3.0mm · 0.47mm/px · 1 of 31 slices shown]
[im 1/31]
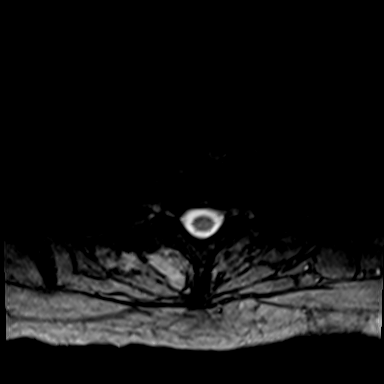

[Series 25: T1 · sagittal · 4.0mm · 1.72mm/px · 1 of 5 slices shown (2 of 7)]
[im 1/5]
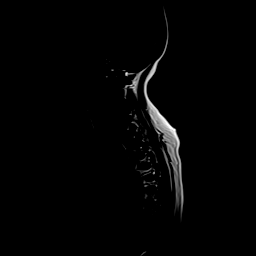

[Series 26: STIR · sagittal · 3.0mm · 1.03mm/px · 2 of 15 slices shown (2 of 3)]
[im 1/15]
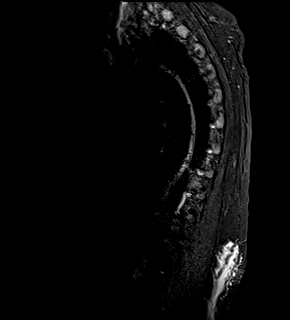
[im 15/15]
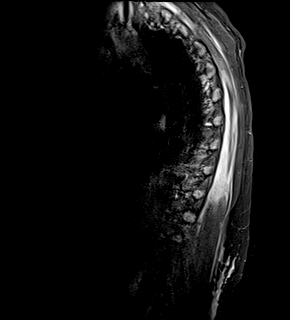

[Series 27: T1 · sagittal · 3.0mm · 1.00mm/px · 2 of 15 slices shown (3 of 7)]
[im 1/15]
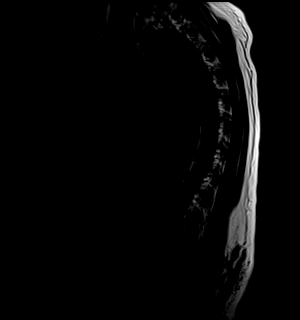
[im 15/15]
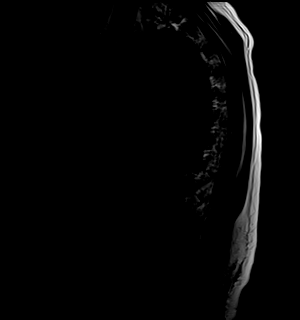

[Series 28: T2 · sagittal · 3.0mm · 0.83mm/px · 2 of 15 slices shown (3 of 5)]
[im 1/15]
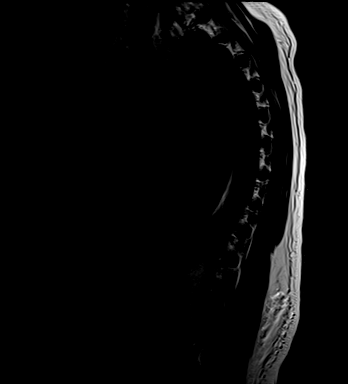
[im 15/15]
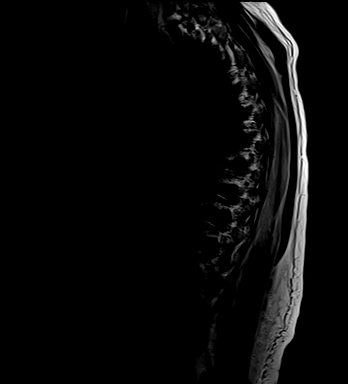

[Series 29: T2 · axial · 4.0mm · 0.78mm/px · z∈[-242,-14]mm · 6 of 55 slices shown (4 of 5)]
[im 1/55]
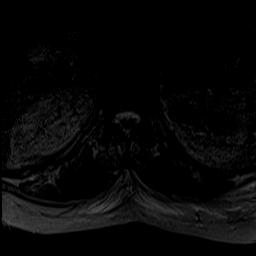
[im 11/55]
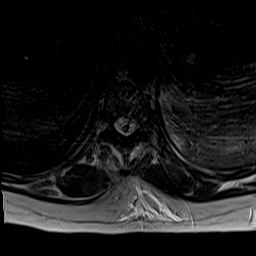
[im 22/55]
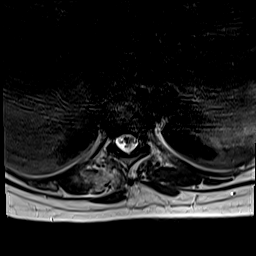
[im 33/55]
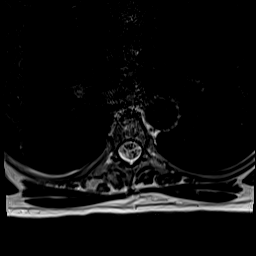
[im 44/55]
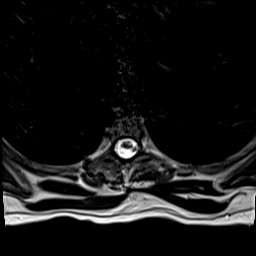
[im 55/55]
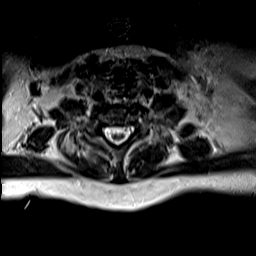

[Series 30: T1 · sagittal · 3.0mm · 1.00mm/px · 2 of 15 slices shown (4 of 7)]
[im 1/15]
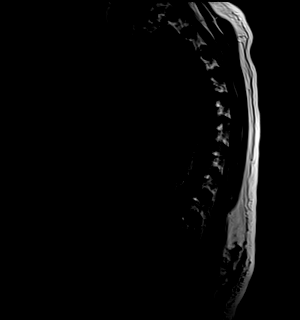
[im 15/15]
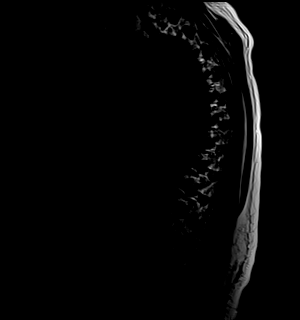

[Series 31: T1 · axial · 4.0mm · 0.39mm/px · z∈[-242,-14]mm · 6 of 55 slices shown (5 of 7)]
[im 1/55]
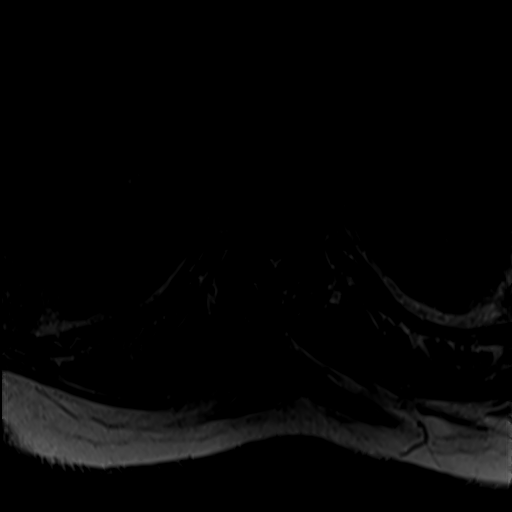
[im 11/55]
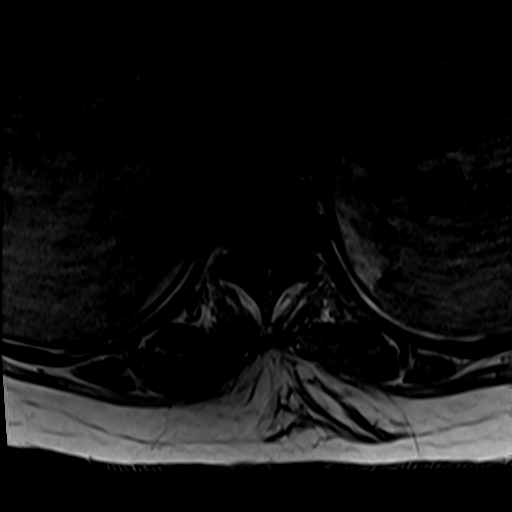
[im 22/55]
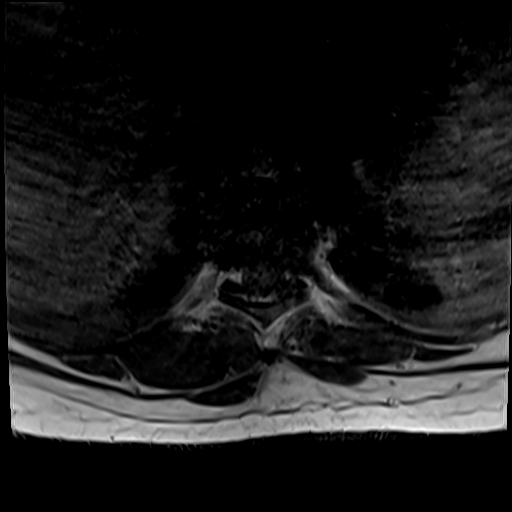
[im 33/55]
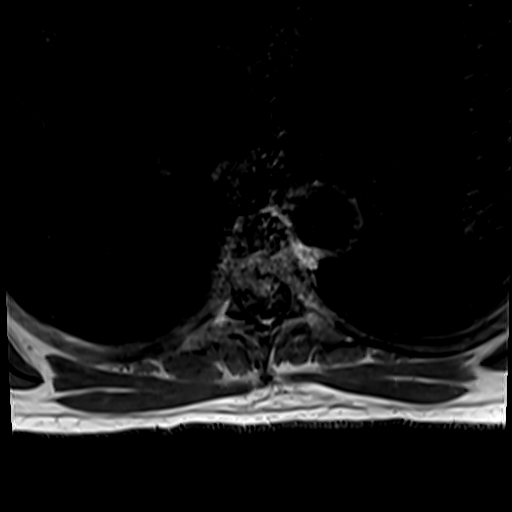
[im 44/55]
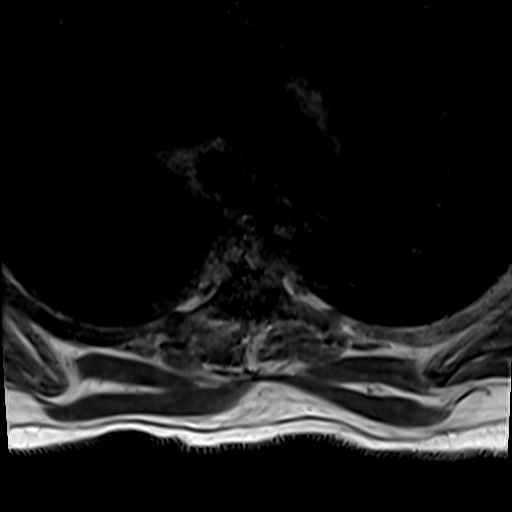
[im 55/55]
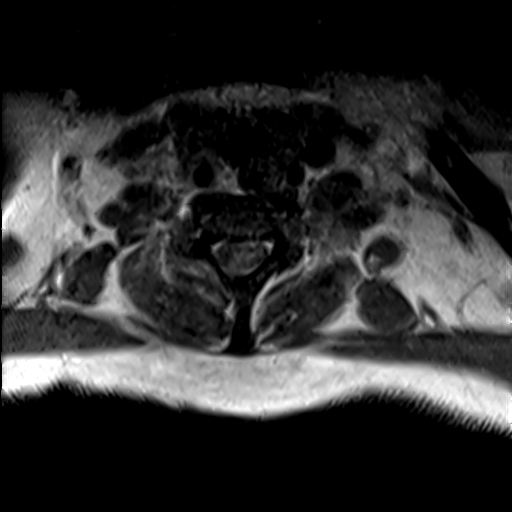

[Series 36: T1 · sagittal · 4.0mm · 0.81mm/px · 2 of 17 slices shown (6 of 7)]
[im 1/17]
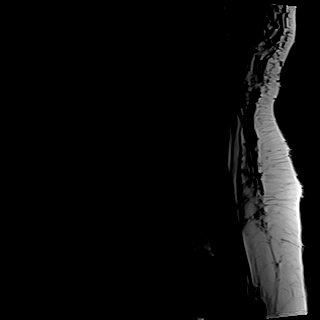
[im 17/17]
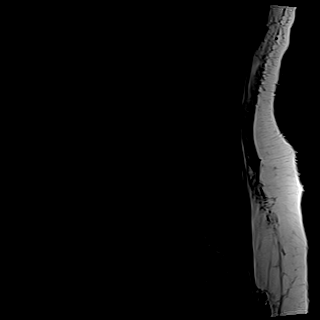

[Series 37: T2 · sagittal · 4.0mm · 0.81mm/px · 2 of 17 slices shown (5 of 5)]
[im 1/17]
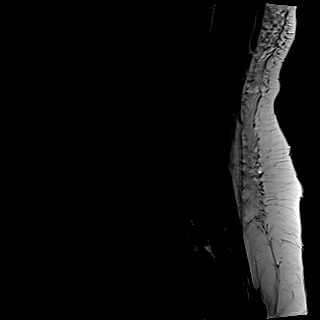
[im 17/17]
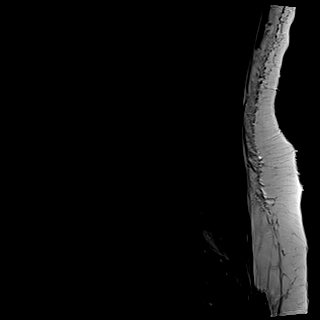

[Series 38: T1 · sagittal · 4.0mm · 0.81mm/px · 2 of 17 slices shown (7 of 7)]
[im 1/17]
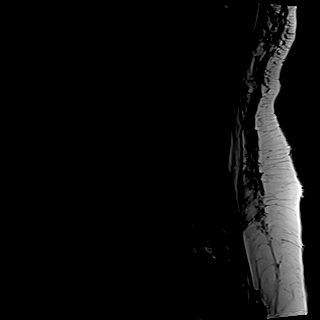
[im 17/17]
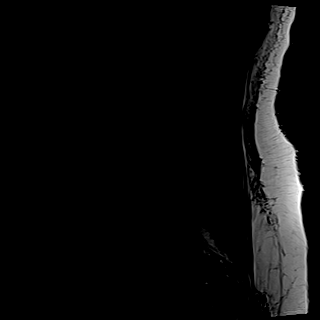

[Series 39: STIR · sagittal · 4.0mm · 0.51mm/px · 2 of 17 slices shown (3 of 3)]
[im 1/17]
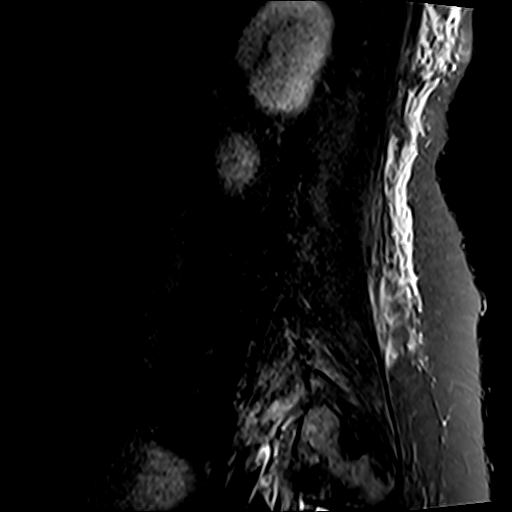
[im 17/17]
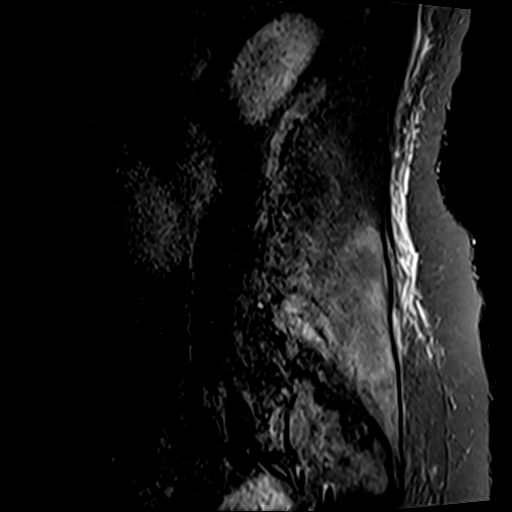

[37 of 48 positions shown; findings below may reference images not displayed]

FINDINGS: Segmentation:  Normal

Motion degraded study. Patient not able to tolerate axial images and
terminated the study early.

Alignment: Slight anterolisthesis L4-5 and mild retrolisthesis L5-S1

Vertebrae: No evidence of fracture or mass. No bone marrow edema. No
evidence of bone infection or discitis.

Conus medullaris and cauda equina: Conus extends to the
approximately L1-2 level. Conus and cauda equina appear normal.

Paraspinal and other soft tissues: Diffuse muscle edema in the right
posterior paraspinous muscles. This extends into the cervical and
thoracic spine as reported separately. No fluid collection
identified.

Disc levels:

L1-2: Negative

L2-3: Negative

L3-4: Negative

L4-5: Mild anterolisthesis. Disc bulging and bilateral facet
degeneration. Moderate spinal stenosis as identified on CT.

L5-S1: Disc degeneration with diffuse posterior endplate spurring.
Moderate subarticular and foraminal stenosis bilaterally due to
spurring.
IMPRESSION: 1. Right posterior muscle edema throughout the lumbar spine
extending into the thoracic and cervical spine most consistent with
myositis. No abscess or fluid collection identified
2. Disc and facet degeneration L4-5 and L5-S1, chronic and unchanged
from recent CT.

## 2021-02-13 MED ORDER — HEPARIN SODIUM (PORCINE) 5000 UNIT/ML IJ SOLN
5000.0000 [IU] | Freq: Three times a day (TID) | INTRAMUSCULAR | Status: DC
  Administered 2021-02-14 – 2021-03-07 (×59): 5000 [IU] via SUBCUTANEOUS
  Filled 2021-02-13 (×59): qty 1

## 2021-02-13 MED ORDER — OLANZAPINE 5 MG PO TABS
5.0000 mg | ORAL_TABLET | Freq: Every day | ORAL | Status: DC
  Administered 2021-02-13 – 2021-02-16 (×4): 5 mg
  Filled 2021-02-13 (×5): qty 1

## 2021-02-13 MED ORDER — LORAZEPAM 2 MG/ML IJ SOLN
2.0000 mg | Freq: Once | INTRAMUSCULAR | Status: AC
  Administered 2021-02-13: 2 mg via INTRAVENOUS
  Filled 2021-02-13: qty 1

## 2021-02-13 MED ORDER — ALPRAZOLAM 0.5 MG PO TABS
0.5000 mg | ORAL_TABLET | Freq: Three times a day (TID) | ORAL | Status: DC | PRN
  Administered 2021-02-14 – 2021-02-16 (×5): 0.5 mg
  Filled 2021-02-13 (×5): qty 1

## 2021-02-13 MED ORDER — PIPERACILLIN-TAZOBACTAM 3.375 G IVPB 30 MIN
3.3750 g | Freq: Once | INTRAVENOUS | Status: AC
  Administered 2021-02-13: 3.375 g via INTRAVENOUS
  Filled 2021-02-13 (×2): qty 50

## 2021-02-13 MED ORDER — PIPERACILLIN-TAZOBACTAM 3.375 G IVPB
3.3750 g | Freq: Three times a day (TID) | INTRAVENOUS | Status: DC
  Administered 2021-02-13 – 2021-02-16 (×8): 3.375 g via INTRAVENOUS
  Filled 2021-02-13 (×8): qty 50

## 2021-02-13 MED ORDER — DOCUSATE SODIUM 50 MG/5ML PO LIQD: 100.0000 mg | Freq: Two times a day (BID) | ORAL | Status: DC | PRN

## 2021-02-13 NOTE — TOC Progression Note (Signed)
Transition of Care [facility]) - Progression Note    Patient Details  Name: Renee Wilkinson MRN: [id number] Date of Birth: May 06, 1971  [facility] CM/SW Contact  Leeroy Cha, RN Phone Number: 02/13/2021, 3:28 PM  Clinical Narrative:    tct-[facility] in [place] where her medicaid benefits are.  Patient will need to have benefits transferred to [place] if she is going to stay in the state.  Number for her to call is [phone] pysch benefits are carved out of her benefits through [place],  Caller not sure if patient has any out of state benefits.   Expected Discharge Plan: Home/Self Care Barriers to Discharge: Continued Medical Work up  Expected Discharge Plan and Services Expected Discharge Plan: Home/Self Care   Discharge Planning Services: CM Consult   Living arrangements for the past 2 months: No permanent address,Homeless Shelter                                       Social Determinants of Health (SDOH) Interventions    Readmission Risk Interventions No flowsheet data found.

## 2021-02-13 NOTE — Procedures (Signed)
Central Venous Catheter Insertion Procedure Note  Tamula Chiappone  [id number]  1971/08/12  Date:02/13/21  Time:10:34 AM   Provider Performing:Pete E Kary Kos   Procedure: Insertion of Non-tunneled Central Venous Catheter(36556)with US guidance [id number])    Indication(s) Hemodialysis  Consent Risks of the procedure as well as the alternatives and risks of each were explained to the patient and/or caregiver.  Consent for the procedure was obtained and is signed in the bedside chart  Anesthesia Topical only with 1% lidocaine   Timeout Verified patient identification, verified procedure, site/side was marked, verified correct patient position, special equipment/implants available, medications/allergies/relevant history reviewed, required imaging and test results available.  Sterile Technique Maximal sterile technique including full sterile barrier drape, hand hygiene, sterile gown, sterile gloves, mask, hair covering, sterile ultrasound probe cover (if used).  Procedure Description Area of catheter insertion was cleaned with chlorhexidine and draped in sterile fashion.   With real-time ultrasound guidance a HD catheter was placed into the left internal jugular vein.  Nonpulsatile blood flow and easy flushing noted in all ports.  The catheter was sutured in place and sterile dressing applied.  Complications/Tolerance None; patient tolerated the procedure well. Chest X-ray is ordered to verify placement for internal jugular or subclavian cannulation.  Chest x-ray is not ordered for femoral cannulation.  EBL Minimal    Specimen(s) None  Erick Colace ACNP-BC [place] Pager # [phone] OR # [phone] if no answer

## 2021-02-13 NOTE — Progress Notes (Signed)
Patient unable to get a HIDA scan as she can't go 6 hrs without pain medication currently.  We repeated an [place] which still just shows gallstones.  Clinically she acts like cholecystitis.  IR doesn't want to pursue perc chole drain without a HIDA as her CT and [place] are unremarkable for cholecystitis.  Difficult to treat her abdominal pain at this time given above circumstances.  Can continue zosyn for now.

## 2021-02-13 NOTE — Progress Notes (Signed)
Subjective: C/o RUQ abdominal pain and still can't move her L left.  Wants to eat, but denies flatus or BM.  NGT with about 1L of output since NGT placement yesterday.  ROS: See above, otherwise other systems negative  Objective: Vital signs in last 24 hours: Temp:  [98.2 F (36.8 C)-100.7 F (38.2 C)] 98.2 F (36.8 C) (03/21 0407) Pulse Rate:  [78-123] 107 (03/21 0700) Resp:  [16-28] 19 (03/21 0700) BP: (114-185)/(55-104) 129/65 (03/21 0700) SpO2:  [90 %-100 %] 95 % (03/21 0700) Weight:  NG:2636742 kg] 108 kg (03/21 0500) Last BM Date:  (PTA)  Intake/Output from previous day: 03/20 0701 - 03/21 0700 In: 1683.8 [P.O.:720; I.V.:963.8] Out: 1763 [Urine:25; Emesis/NG output:850] Intake/Output this shift: No intake/output data recorded.  PE: Gen: anxious, but NAD Heart: regular, but tachy, few ectopic beats Lungs: CTAB Abd: soft, infraumbilical hernia is soft and feels reducible.  NGT in place with bilious output, NGT cannister full, very tender in RUQ, but not so much otherwise.  Lab Results:  Recent Labs    02/12/21 0500 02/13/21 0559  WBC 18.8* 22.6*  HGB 9.2* 7.9*  HCT 28.4* 24.3*  PLT 133* 145*   BMET Recent Labs    02/12/21 2058 02/13/21 0559  NA 131* 132*   133*  K 4.8 4.6   4.6  CL 98 99   99  CO2 '24 24   24  '$ GLUCOSE 137* 116*   116*  BUN 25* 34*   35*  CREATININE 3.44* 4.34*   4.22*  CALCIUM 7.5* 7.5*   7.5*   PT/INR Recent Labs    02/12/21 1700  LABPROT 12.7  INR 1.0   CMP     Component Value Date/Time   NA 132 (L) 02/13/2021 0559   NA 133 (L) 02/13/2021 0559   K 4.6 02/13/2021 0559   K 4.6 02/13/2021 0559   CL 99 02/13/2021 0559   CL 99 02/13/2021 0559   CO2 24 02/13/2021 0559   CO2 24 02/13/2021 0559   GLUCOSE 116 (H) 02/13/2021 0559   GLUCOSE 116 (H) 02/13/2021 0559   BUN 34 (H) 02/13/2021 0559   BUN 35 (H) 02/13/2021 0559   CREATININE 4.34 (H) 02/13/2021 0559   CREATININE 4.22 (H) 02/13/2021 0559   CALCIUM 7.5 (L) 02/13/2021  0559   CALCIUM 7.5 (L) 02/13/2021 0559   PROT 6.1 (L) 02/13/2021 0559   ALBUMIN 2.3 (L) 02/13/2021 0559   ALBUMIN 2.3 (L) 02/13/2021 0559   AST 528 (H) 02/13/2021 0559   ALT 239 (H) 02/13/2021 0559   ALKPHOS 99 02/13/2021 0559   BILITOT 0.5 02/13/2021 0559   GFRNONAA 12 (L) 02/13/2021 0559   GFRNONAA 12 (L) 02/13/2021 0559   Lipase  No results found for: LIPASE     Studies/Results: CT ABDOMEN PELVIS WO CONTRAST  Result Date: 02/12/2021 CLINICAL DATA:  Low back pain, abdominal pain, acute renal failure. EXAM: CT ABDOMEN AND PELVIS WITHOUT CONTRAST TECHNIQUE: Multidetector CT imaging of the abdomen and pelvis was performed following the standard protocol without IV contrast. COMPARISON:  Abdominal ultrasound dated 02/10/2021 and CT abdomen pelvis dated 02/09/2021. FINDINGS: Lower chest: There is a moderate bibasilar atelectasis. Hepatobiliary: No focal liver abnormality is seen. No gallstones, gallbladder wall thickening, or biliary dilatation. Pancreas: Unremarkable. No pancreatic ductal dilatation or surrounding inflammatory changes. Spleen: Normal in size without focal abnormality. Adrenals/Urinary Tract: Adrenal glands are unremarkable. Kidneys appear normal on this non contrast exam, without renal calculi, focal lesion, or  hydronephrosis. A Foley catheter is in place. Stomach/Bowel: Stomach is within normal limits. There is a high-grade small bowel obstruction with a transition point to decompressed distal small bowel within an infraumbilical ventral abdominal wall hernia containing a loop of small bowel. Multiple dilated loops of small bowel with air-fluid levels and fecalization of bowel contents are seen proximal to the obstruction. The appendix appears normal. No evidence of bowel inflammatory changes. Vascular/Lymphatic: Aortic atherosclerosis. No enlarged abdominal or pelvic lymph nodes. Reproductive: Uterus and bilateral adnexa are unremarkable. Other: Trace free fluid is seen in the  pelvis. Musculoskeletal: Degenerative changes are seen at L5-S1. IMPRESSION: 1. High-grade small bowel obstruction with a transition point within an infraumbilical ventral abdominal wall hernia. Aortic Atherosclerosis (ICD10-I70.0). These results were called by telephone at the time of interpretation on 02/12/2021 at 2:39 pm to provider [facility] , who verbally acknowledged these results. Electronically Signed   By: Zerita Boers M.D.   On: 02/12/2021 14:40   DG Chest 1 View  Result Date: 02/11/2021 CLINICAL DATA:  Central line placement EXAM: CHEST  1 VIEW COMPARISON:  02/09/2021 FINDINGS: Right internal jugular center venous catheter tip: Lower SVC. No pneumothorax. Low lung volumes. Subsegmental atelectasis along both hemidiaphragms. Cardiac and mediastinal margins appear normal. IMPRESSION: 1. Right internal jugular center venous catheter tip: Lower SVC. No pneumothorax. 2. Subsegmental atelectasis along both hemidiaphragms. Electronically Signed   By: Van Clines M.D.   On: 02/11/2021 15:02   DG Abd 1 View  Result Date: 02/12/2021 CLINICAL DATA:  NG tube placement EXAM: ABDOMEN - 1 VIEW COMPARISON:  CT 02/12/2021 FINDINGS: NG tube tip is in the proximal stomach with the side port in the distal esophagus. Dilated small bowel loops compatible with small bowel obstruction. IMPRESSION: NG tube tip in the proximal stomach. Electronically Signed   By: Rolm Baptise M.D.   On: 02/12/2021 15:38   CT THORACIC SPINE WO CONTRAST  Result Date: 02/12/2021 CLINICAL DATA:  Possible IV drug use. Left leg weakness and back pain. EXAM: CT THORACIC SPINE WITHOUT CONTRAST TECHNIQUE: Multidetector CT images of the thoracic were obtained using the standard protocol without intravenous contrast. COMPARISON:  None. FINDINGS: Alignment: Increased kyphotic curvature. Vertebrae: No fracture or primary bone lesion. No finding to suggest bone infection or disc space infection. Paraspinal and other soft tissues:  Negative other than atelectasis or scarring in the lower lobes. Disc levels: Chronic disc space narrowing from T3-4 through T9-10. Small endplate osteophytes. No apparent bony stenosis of the canal or foramina. Ordinary mild facet osteoarthritis. No finding to suggest regional infection. IMPRESSION: Increased kyphotic curvature. Chronic degenerative disc disease from T3-4 through T9-10. No apparent bony stenosis of the canal or foramina. No finding to suggest bone infection or disc space infection. Electronically Signed   By: Nelson Chimes M.D.   On: 02/12/2021 13:17   CT LUMBAR SPINE WO CONTRAST  Result Date: 02/12/2021 CLINICAL DATA:  Possible IV drug use. Back pain and left leg numbness. EXAM: CT LUMBAR SPINE WITHOUT CONTRAST TECHNIQUE: Multidetector CT imaging of the lumbar spine was performed without intravenous contrast administration. Multiplanar CT image reconstructions were also generated. COMPARISON:  None. FINDINGS: Segmentation: 5 lumbar type vertebral bodies. Alignment: 2 mm degenerative anterolisthesis L4-5. Vertebrae: No fracture or focal bone lesion. Chronic discogenic endplate sclerotic changes at L5-S1. No bone finding to suggest infection. Paraspinal and other soft tissues: Negative Disc levels: No significant finding from T12-L1 through L3-4. L4-5: Advanced bilateral facet arthropathy with gaping joints. 2 mm of anterolisthesis,  which would likely worsen with standing or flexion. Bulging of the disc. Multifactorial stenosis that would likely worsen with standing or flexion. Neural compression could occur at this level. L5-S1: Chronic disc degeneration with loss of disc height. Endplate osteophytes and bulging of the disc. Vacuum phenomenon. Chronic endplate sclerosis without evidence of erosion. Bilateral foraminal stenosis could affect either L5 nerve. 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 IMPRESSION: 1. L4-5: Advanced bilateral facet arthropathy with gaping joints. 2 mm  of anterolisthesis, which would likely worsen with standing or flexion. Bulging of the disc. Multifactorial stenosis that would likely worsen with standing or flexion. Neural compression could occur at this level. 2. L5-S1: Chronic disc degeneration with loss of disc height. Endplate osteophytes and bulging of the disc. Bilateral foraminal stenosis could affect either L5 nerve. 3. 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 4. No finding to suggest regional spinal infection. Electronically Signed   By: Nelson Chimes M.D.   On: 02/12/2021 13:20   DG CHEST PORT 1 VIEW  Result Date: 02/12/2021 CLINICAL DATA:  Central line complication. Concern for location of central line. EXAM: PORTABLE CHEST 1 VIEW COMPARISON:  Prior chest radiograph 02/11/2021. FINDINGS: Right IJ approach central venous catheter with tip projecting in the region of the mid to upper SVC. Shallow inspiration radiograph. Heart size within normal limits. As before, there is subsegmental atelectasis within both lung bases. No appreciable pleural effusion or evidence of pneumothorax. No acute bony abnormality identified. IMPRESSION: Right IJ approach central venous catheter with tip projecting in the region of the mid to upper SVC. Shallow inspiration radiograph. Redemonstrated bibasilar subsegmental atelectasis. Electronically Signed   By: Kellie Simmering DO   On: 02/12/2021 13:40   ECHOCARDIOGRAM COMPLETE  Result Date: 02/11/2021    ECHOCARDIOGRAM REPORT   Patient Name:   Renee Wilkinson Date of Exam: 02/11/2021 Medical Rec #:  [id number]       Height:       65.0 in Accession #:    [id number]      Weight:       238.3 lb Date of Birth:  [birth year]       BSA:          2.131 m Patient Age:    50 years        BP:           108/46 mmHg Patient Gender: F               HR:           70 bpm. Exam Location:  Inpatient Procedure: 2D Echo, Cardiac Doppler, Color Doppler and Intracardiac            Opacification Agent Indications:    Acute Iscemic  Heart Disease I24.9  History:        Patient has no prior history of Echocardiogram examinations.  Sonographer:    Jonelle Sidle Dance Referring Phys: [place] Comments: No subcostal window. IMPRESSIONS  1. Left ventricular ejection fraction, by estimation, is 60 to 65%. The left ventricle has normal function. The left ventricle has no regional wall motion abnormalities. Left ventricular diastolic parameters were normal.  2. Right ventricular systolic function was not well visualized. The right ventricular size is not well visualized. Tricuspid regurgitation signal is inadequate for assessing PA pressure.  3. The mitral valve is normal in structure. No evidence of mitral valve regurgitation. No evidence of mitral stenosis.  4. The aortic valve is normal in structure. Aortic  valve regurgitation is not visualized. No aortic stenosis is present.  5. The inferior vena cava is normal in size with greater than 50% respiratory variability, suggesting right atrial pressure of 3 mmHg. FINDINGS  Left Ventricle: Left ventricular ejection fraction, by estimation, is 60 to 65%. The left ventricle has normal function. The left ventricle has no regional wall motion abnormalities. Definity contrast agent was given IV to delineate the left ventricular  endocardial borders. The left ventricular internal cavity size was normal in size. There is no left ventricular hypertrophy. Left ventricular diastolic parameters were normal. Normal left ventricular filling pressure. Right Ventricle: The right ventricular size is not well visualized. Right vetricular wall thickness was not assessed. Right ventricular systolic function was not well visualized. Tricuspid regurgitation signal is inadequate for assessing PA pressure. Left Atrium: Left atrial size was normal in size. Right Atrium: Right atrial size was normal in size. Pericardium: There is no evidence of pericardial effusion. Mitral Valve: The mitral valve is normal  in structure. No evidence of mitral valve regurgitation. No evidence of mitral valve stenosis. Tricuspid Valve: The tricuspid valve is normal in structure. Tricuspid valve regurgitation is trivial. No evidence of tricuspid stenosis. Aortic Valve: The aortic valve is normal in structure. Aortic valve regurgitation is not visualized. No aortic stenosis is present. Pulmonic Valve: The pulmonic valve was normal in structure. Pulmonic valve regurgitation is not visualized. No evidence of pulmonic stenosis. Aorta: The aortic root is normal in size and structure. Venous: The inferior vena cava was not well visualized. The inferior vena cava is normal in size with greater than 50% respiratory variability, suggesting right atrial pressure of 3 mmHg. IAS/Shunts: No atrial level shunt detected by color flow Doppler.  LEFT VENTRICLE PLAX 2D LVIDd:         4.30 cm  Diastology LVIDs:         3.00 cm  LV e' medial:    8.81 cm/s LV PW:         1.00 cm  LV E/e' medial:  9.5 LV IVS:        0.90 cm  LV e' lateral:   9.79 cm/s LVOT diam:     2.10 cm  LV E/e' lateral: 8.6 LV SV:         74 LV SV Index:   35 LVOT Area:     3.46 cm  LEFT ATRIUM             Index LA diam:        3.50 cm 1.64 cm/m LA Vol (A2C):   45.3 ml 21.26 ml/m LA Vol (A4C):   30.5 ml 14.31 ml/m LA Biplane Vol: 40.4 ml 18.96 ml/m  AORTIC VALVE LVOT Vmax:   133.00 cm/s LVOT Vmean:  78.900 cm/s LVOT VTI:    0.215 m  AORTA Ao Root diam: 3.10 cm Ao Asc diam:  2.80 cm MITRAL VALVE MV Area (PHT): 2.87 cm    SHUNTS MV Decel Time: 264 msec    Systemic VTI:  0.22 m MV E velocity: 83.90 cm/s  Systemic Diam: 2.10 cm MV A velocity: 81.90 cm/s MV E/A ratio:  1.02 Fransico Him MD Electronically signed by Fransico Him MD Signature Date/Time: 02/11/2021/1:42:44 PM    Final     Anti-infectives: Anti-infectives (From admission, onward)   Start     Dose/Rate Route Frequency Ordered Stop   02/13/21 0815  piperacillin-tazobactam (ZOSYN) IVPB 3.375 g        3.375 g 100 mL/hr  over  30 Minutes Intravenous  Once 02/13/21 0733     02/09/21 1015  ceFEPIme (MAXIPIME) 2 g in sodium chloride 0.9 % 100 mL IVPB        2 g 200 mL/hr over 30 Minutes Intravenous  Once 02/09/21 1010 02/09/21 1112   02/09/21 1015  vancomycin (VANCOCIN) IVPB 1000 mg/200 mL premix        1,000 mg 200 mL/hr over 60 Minutes Intravenous  Once 02/09/21 1010 02/09/21 1901       Assessment/Plan Substance abuse Rhabdomyolysis with hyperkalemia ARF - HD cath came out.  CRRT/HD on hold.  Cr up to 4.22 today.  Per CCM Leukocytosis - 22K RLE weakness. Secondary to lumbar spine stenosis.    Umbilical hernia - intermittent incarceration with pSBO -Cont NGT for today and await bowel function.  Hernia seems soft and reducible.   -cont to follow output. Gallstones/Elevated transaminases/RUQ abdominal pain -WBC back up to 22K today.   -LFTs are still up but downtrending.  May be secondary to some shock liver, but doesn't possibly preclude cholecystitis secondary to cholestasis. -[place] rather unremarkable, but continues to have pain. -will discuss possible HIDA with MD -cont to follow.  FEN - NPO/NGT/IVFs VTE - heparin ID - zosyn   LOS: 4 days    Henreitta Cea , [facility] 02/13/2021, 7:38 AM Please see Amion for pager number during day hours 7:00am-4:30pm or 7:00am -11:30am on weekends

## 2021-02-13 NOTE — Progress Notes (Signed)
Pharmacy Antibiotic Note  Renee Wilkinson is a 50 y.o. female admitted on 02/09/2021 after being found down s/p overdose.  Pt admitted with shock liver, abdominal pain, rhabdo, AKI requiring CRRT. CT abdomen on 3/20 revealed high grade SBO with ventral hernia. WBC elevated, Tmax 100.7 - pharmacy consulted to dose piperacillin/tazobactam for intra-abdominal infection.   Today, 02/13/21  WBC 22.6, increasing  SCr 4.3. Pt has been on CRRT since 3/17. HD cath removed on 3/20 so CRRT currently on hold. Planning to resume today  Tmax 100.7 F  Plan:  Piperacillin/tazobactam 3.375 g IV q8h EI  Follow culture data, renal function/CRRT for necessary dose adjustments  Height: '5\' 5"'$  (165.1 cm) Weight: 108 kg (238 lb 1.6 oz) IBW/kg (Calculated) : 57  Temp (24hrs), Avg:99.4 F (37.4 C), Min:98.2 F (36.8 C), Max:100.7 F (38.2 C)  Recent Labs  Lab 02/09/21 0830 02/09/21 0845 02/09/21 1108 02/09/21 1200 02/10/21 0506 02/10/21 1144 02/11/21 0402 02/11/21 1138 02/12/21 0500 02/12/21 1320 02/12/21 1700 02/12/21 2058 02/13/21 0559  WBC 30.6*  --   --   --  21.8*  --  16.2*  --  18.8*  --   --   --  22.6*  CREATININE 3.39*  --   --    < > 2.54*  2.58*   < > 2.24*  2.27*   < > 2.46*  2.46* 2.72*  2.56* 3.02* 3.44* 4.34*  4.22*  LATICACIDVEN  --  4.4* 4.0*  --   --   --   --   --   --   --   --   --   --    < > = values in this interval not displayed.    Estimated Creatinine Clearance: 18.9 mL/min (A) (by C-G formula based on SCr of 4.34 mg/dL (H)).    No Known Allergies  Antimicrobials this admission: Pip/tazo 3/21 >>  Cefepime/vancomycin x1 in ED 3/17   Dose adjustments this admission:  Microbiology results: 3/17 BCx: ngtd  3/17 MRSA PCR: Positive  Thank you for allowing pharmacy to be a part of this patient's care.  Lenis Noon, PharmD 02/13/2021 8:15 AM

## 2021-02-13 NOTE — Consult Note (Signed)
Chief Complaint: Patient was seen in consultation today for percutaneous cholecystostomy Chief Complaint  Patient presents with  . Shortness of Breath    Referring Physician(s): Jeanmarie Hubert  Supervising Physician: Vernia Buff  Patient Status: [facility] - In-pt  History of Present Illness: Renee Wilkinson is a 50 y.o. female who was admitted to [facility] on 02/09/2021 for rhabdomyolysis, hyperkalemia and inability to move her legs.  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  When she awoke she could not move and her husband was dead beside her.  She has since complained of abdominal pain, more so right-sided and previously had some nausea and vomiting.  She is currently on CRRT and has NG tube in place.  CT abdomen pelvis performed on 3/20 revealed high-grade small bowel obstruction with transition point within an infraumbilical ventral abdominal wall hernia.  Due to persistent right upper quadrant abdominal pain ultrasound was performed today which revealed cholelithiasis and sludge.  There was no evidence of acute cholecystitis.  Fatty liver changes noted.  Myositis as well as disc and facet degeneration noted on spinal imaging.  Patient currently afebrile, WBC 22.6, hemoglobin 7.9, platelets 145k, creatinine 4.22, total bilirubin 0.5, ALT/AST elevated (? shock liver), PT/INR normal, COVID-19 negative.  Request received from surgical team for consideration of possible percutaneous cholecystostomy.    Allergies: Patient has no known allergies.  Medications: Prior to Admission medications   Medication Sig Start Date End Date Taking? Authorizing Provider  ibuprofen (ADVIL) 200 MG tablet Take 200 mg by mouth every 6 (six) hours as needed for mild pain.   Yes [provider]     No family history on file.  Social History   Socioeconomic History  . Marital status: Widowed    Spouse name: Not on file  . Number of children: Not on  file  . Years of education: Not on file  . Highest education level: Not on file  Occupational History  . Not on file  Tobacco Use  . Smoking status: Not on file  . Smokeless tobacco: Not on file  Substance and Sexual Activity  . Alcohol use: Not on file  . Drug use: Not on file  . Sexual activity: Not on file  Other Topics Concern  . Not on file  Social History Narrative  . Not on file   Social Determinants of Health   Financial Resource Strain: Not on file  Food Insecurity: Not on file  Transportation Needs: Not on file  Physical Activity: Not on file  Stress: Not on file  Social Connections: Not on file      Review of Systems see above; currently denies fever, cough or bleeding.  Does have occasional headaches, intermittent chest discomfort and dyspnea as well as persistent abdominal pain, more so right upper quadrant.  Vital Signs: BP (!) 135/52   Pulse (!) 102   Temp 97.9 F (36.6 C) (Oral)   Resp 16   Ht '5\' 5"'$  (1.651 m)   Wt 238 lb 1.6 oz (108 kg)   SpO2 96%   BMI 39.62 kg/m   Physical Exam patient awake, answers questions okay.  Asking for something to drink.  NG tube in place.  CRRT ongoing; chest clear to auscultation bilaterally.  Heart with slightly tachycardic rate, occasional ectopy.  Abdomen soft, some diffuse abdominal tenderness noted, more so in RUQ; some weakness noted in left leg.  Imaging: CT ABDOMEN PELVIS WO CONTRAST  Result Date: 02/12/2021 CLINICAL  DATA:  Low back pain, abdominal pain, acute renal failure. EXAM: CT ABDOMEN AND PELVIS WITHOUT CONTRAST TECHNIQUE: Multidetector CT imaging of the abdomen and pelvis was performed following the standard protocol without IV contrast. COMPARISON:  Abdominal ultrasound dated 02/10/2021 and CT abdomen pelvis dated 02/09/2021. FINDINGS: Lower chest: There is a moderate bibasilar atelectasis. Hepatobiliary: No focal liver abnormality is seen. No gallstones, gallbladder wall thickening, or biliary dilatation.  Pancreas: Unremarkable. No pancreatic ductal dilatation or surrounding inflammatory changes. Spleen: Normal in size without focal abnormality. Adrenals/Urinary Tract: Adrenal glands are unremarkable. Kidneys appear normal on this non contrast exam, without renal calculi, focal lesion, or hydronephrosis. A Foley catheter is in place. Stomach/Bowel: Stomach is within normal limits. There is a high-grade small bowel obstruction with a transition point to decompressed distal small bowel within an infraumbilical ventral abdominal wall hernia containing a loop of small bowel. Multiple dilated loops of small bowel with air-fluid levels and fecalization of bowel contents are seen proximal to the obstruction. The appendix appears normal. No evidence of bowel inflammatory changes. Vascular/Lymphatic: Aortic atherosclerosis. No enlarged abdominal or pelvic lymph nodes. Reproductive: Uterus and bilateral adnexa are unremarkable. Other: Trace free fluid is seen in the pelvis. Musculoskeletal: Degenerative changes are seen at L5-S1. IMPRESSION: 1. High-grade small bowel obstruction with a transition point within an infraumbilical ventral abdominal wall hernia. Aortic Atherosclerosis (ICD10-I70.0). These results were called by telephone at the time of interpretation on 02/12/2021 at 2:39 pm to provider [facility] , who verbally acknowledged these results. Electronically Signed   By: Zerita Boers M.D.   On: 02/12/2021 14:40   DG Chest 1 View  Result Date: 02/11/2021 CLINICAL DATA:  Central line placement EXAM: CHEST  1 VIEW COMPARISON:  02/09/2021 FINDINGS: Right internal jugular center venous catheter tip: Lower SVC. No pneumothorax. Low lung volumes. Subsegmental atelectasis along both hemidiaphragms. Cardiac and mediastinal margins appear normal. IMPRESSION: 1. Right internal jugular center venous catheter tip: Lower SVC. No pneumothorax. 2. Subsegmental atelectasis along both hemidiaphragms. Electronically Signed   By:  Van Clines M.D.   On: 02/11/2021 15:02   DG Abd 1 View  Result Date: 02/12/2021 CLINICAL DATA:  NG tube placement EXAM: ABDOMEN - 1 VIEW COMPARISON:  CT 02/12/2021 FINDINGS: NG tube tip is in the proximal stomach with the side port in the distal esophagus. Dilated small bowel loops compatible with small bowel obstruction. IMPRESSION: NG tube tip in the proximal stomach. Electronically Signed   By: Rolm Baptise M.D.   On: 02/12/2021 15:38   CT Head Wo Contrast  Result Date: 02/09/2021 CLINICAL DATA:  Lower extremity numbness. EXAM: CT HEAD WITHOUT CONTRAST TECHNIQUE: Contiguous axial images were obtained from the base of the skull through the vertex without intravenous contrast. COMPARISON:  None. FINDINGS: Brain: Ventricles and sulci are normal in size and configuration. There is no intracranial mass, hemorrhage, extra-axial fluid collection, or midline shift. The brain parenchyma appears unremarkable. No appreciable acute infarct. Vascular: No hyperdense vessel. There is calcification in each carotid siphon region. Skull: Bony calvarium a appears intact. Sinuses/Orbits: Opacification noted in a posterior ethmoid air cell on the right. Orbits appear symmetric bilaterally. Other: Mastoid air cells are clear. IMPRESSION: Normal appearing brain parenchyma.  No mass or hemorrhage. Foci of arterial vascular calcification noted. Opacification noted in a posterior right-sided ethmoid air cell. Electronically Signed   By: Lowella Grip III M.D.   On: 02/09/2021 11:01   CT THORACIC SPINE WO CONTRAST  Result Date: 02/12/2021 CLINICAL DATA:  Possible  IV drug use. Left leg weakness and back pain. EXAM: CT THORACIC SPINE WITHOUT CONTRAST TECHNIQUE: Multidetector CT images of the thoracic were obtained using the standard protocol without intravenous contrast. COMPARISON:  None. FINDINGS: Alignment: Increased kyphotic curvature. Vertebrae: No fracture or primary bone lesion. No finding to suggest bone  infection or disc space infection. Paraspinal and other soft tissues: Negative other than atelectasis or scarring in the lower lobes. Disc levels: Chronic disc space narrowing from T3-4 through T9-10. Small endplate osteophytes. No apparent bony stenosis of the canal or foramina. Ordinary mild facet osteoarthritis. No finding to suggest regional infection. IMPRESSION: Increased kyphotic curvature. Chronic degenerative disc disease from T3-4 through T9-10. No apparent bony stenosis of the canal or foramina. No finding to suggest bone infection or disc space infection. Electronically Signed   By: Nelson Chimes M.D.   On: 02/12/2021 13:17   CT LUMBAR SPINE WO CONTRAST  Result Date: 02/12/2021 CLINICAL DATA:  Possible IV drug use. Back pain and left leg numbness. EXAM: CT LUMBAR SPINE WITHOUT CONTRAST TECHNIQUE: Multidetector CT imaging of the lumbar spine was performed without intravenous contrast administration. Multiplanar CT image reconstructions were also generated. COMPARISON:  None. FINDINGS: Segmentation: 5 lumbar type vertebral bodies. Alignment: 2 mm degenerative anterolisthesis L4-5. Vertebrae: No fracture or focal bone lesion. Chronic discogenic endplate sclerotic changes at L5-S1. No bone finding to suggest infection. Paraspinal and other soft tissues: Negative Disc levels: No significant finding from T12-L1 through L3-4. L4-5: Advanced bilateral facet arthropathy with gaping joints. 2 mm of anterolisthesis, which would likely worsen with standing or flexion. Bulging of the disc. Multifactorial stenosis that would likely worsen with standing or flexion. Neural compression could occur at this level. L5-S1: Chronic disc degeneration with loss of disc height. Endplate osteophytes and bulging of the disc. Vacuum phenomenon. Chronic endplate sclerosis without evidence of erosion. Bilateral foraminal stenosis could affect either L5 nerve. 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 IMPRESSION:  1. L4-5: Advanced bilateral facet arthropathy with gaping joints. 2 mm of anterolisthesis, which would likely worsen with standing or flexion. Bulging of the disc. Multifactorial stenosis that would likely worsen with standing or flexion. Neural compression could occur at this level. 2. L5-S1: Chronic disc degeneration with loss of disc height. Endplate osteophytes and bulging of the disc. Bilateral foraminal stenosis could affect either L5 nerve. 3. 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 4. No finding to suggest regional spinal infection. Electronically Signed   By: Nelson Chimes M.D.   On: 02/12/2021 13:20   MR CERVICAL SPINE WO CONTRAST  Result Date: 02/13/2021 CLINICAL DATA:  Myelopathy, acute or progressive. Suspect infection. Drug abuse. EXAM: MRI CERVICAL SPINE WITHOUT CONTRAST TECHNIQUE: Multiplanar, multisequence MR imaging of the cervical spine was performed. No intravenous contrast was administered. COMPARISON:  None. FINDINGS: Alignment: Mild retrolisthesis C5-6 otherwise normal alignment. Straightening of the cervical lordosis Vertebrae: Negative for fracture or mass. No evidence of spinal infection on unenhanced imaging. Cord: Normal signal and morphology. Posterior Fossa, vertebral arteries, paraspinal tissues: Edema in the right posterior paraspinous muscles extending from C4 through the upper thoracic spine. No focal fluid collection. Disc levels: C2-3: Mild left foraminal narrowing due to facet hypertrophy. C3-4: Mild facet degeneration bilaterally.  Negative for stenosis C4-5: Bilateral facet degeneration left greater than right. Disc degeneration and uncinate spurring. Mild foraminal narrowing bilaterally. C5-6: Mild retrolisthesis. Disc degeneration with diffuse uncinate spurring and bilateral facet degeneration. Moderate to severe foraminal encroachment bilaterally right greater than left. Mild spinal stenosis C6-7: Mild  facet degeneration bilaterally. Mild right  foraminal stenosis C7-T1: Mild right foraminal narrowing due to facet degeneration. IMPRESSION: Muscle edema in the right posterior paraspinous muscles without fluid collection. This extends into the thoracic spine and likely is due to myositis. No abscess Degenerative changes in the thoracic spine. Multilevel spinal and foraminal stenosis due to spurring. Electronically Signed   By: Franchot Gallo M.D.   On: 02/13/2021 10:03   MR THORACIC SPINE WO CONTRAST  Result Date: 02/13/2021 CLINICAL DATA:  Myelopathy, acute or progressive. History of drug abuse. EXAM: MRI THORACIC SPINE WITHOUT CONTRAST TECHNIQUE: Multiplanar, multisequence MR imaging of the thoracic spine was performed. No intravenous contrast was administered. COMPARISON:  None. FINDINGS: Alignment:  Normal alignment.  Mild thoracic kyphosis. Vertebrae: Normal bone marrow. Negative for fracture, mass, or bone infection. No evidence of discitis. Cord: Cord evaluation limited by motion. There is significant CSF flow artifact in the spinal canal. Allowing for this, no suspicious cord lesion identified. Paraspinal and other soft tissues: Extensive edema in the right paraspinous muscles. This is only seen on the right side. Diffuse low-density edema is present without focal fluid collection. This extends into the cervical spine as described separately. Disc levels: Multilevel disc degeneration with disc space narrowing and mild spurring throughout much of the thoracic spine. No focal disc protrusion or significant spinal stenosis. IMPRESSION: Extensive muscle edema on the right involving the posterior paraspinous muscles. Findings most compatible with myositis. No abscess identified on unenhanced images. Motion degraded study. Electronically Signed   By: Franchot Gallo M.D.   On: 02/13/2021 10:21   MR LUMBAR SPINE WO CONTRAST  Result Date: 02/13/2021 CLINICAL DATA:  Acute myelopathy.  History of drug abuse. EXAM: MRI LUMBAR SPINE WITHOUT CONTRAST  TECHNIQUE: Multiplanar, multisequence MR imaging of the lumbar spine was performed. No intravenous contrast was administered. COMPARISON:  CT lumbar spine 02/12/2021 FINDINGS: Segmentation:  Normal Motion degraded study. Patient not able to tolerate axial images and terminated the study early. Alignment: Slight anterolisthesis L4-5 and mild retrolisthesis L5-S1 Vertebrae: No evidence of fracture or mass. No bone marrow edema. No evidence of bone infection or discitis. Conus medullaris and cauda equina: Conus extends to the approximately L1-2 level. Conus and cauda equina appear normal. Paraspinal and other soft tissues: Diffuse muscle edema in the right posterior paraspinous muscles. This extends into the cervical and thoracic spine as reported separately. No fluid collection identified. Disc levels: L1-2: Negative L2-3: Negative L3-4: Negative L4-5: Mild anterolisthesis. Disc bulging and bilateral facet degeneration. Moderate spinal stenosis as identified on CT. L5-S1: Disc degeneration with diffuse posterior endplate spurring. Moderate subarticular and foraminal stenosis bilaterally due to spurring. IMPRESSION: 1. Right posterior muscle edema throughout the lumbar spine extending into the thoracic and cervical spine most consistent with myositis. No abscess or fluid collection identified 2. 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 Electronically Signed   By: Franchot Gallo M.D.   On: 02/13/2021 10:25   DG Chest Port 1 View  Result Date: 02/13/2021 CLINICAL DATA:  Central line placement. EXAM: PORTABLE CHEST 1 VIEW COMPARISON:  02/12/2021 and CT chest 02/09/2021. FINDINGS: Removal of right IJ catheter with placement of a left IJ catheter. Tip is in the low SVC. Nasogastric tube terminates in the stomach with the side port in the region of the gastroesophageal junction. Heart size is accentuated by AP semi upright technique and low lung volumes. Minimal streaky  atelectasis in the left lung base. No definite pleural fluid. No  pneumothorax. IMPRESSION: 1. Interval left IJ central line placement without complicating feature. 2. Nasogastric tube could be advanced several cm to better position the side port below the gastroesophageal junction, as clinically indicated. 3. Low lung volumes with minimal left basilar subsegmental atelectasis Electronically Signed   By: Lorin Picket M.D.   On: 02/13/2021 12:50   DG CHEST PORT 1 VIEW  Result Date: 02/12/2021 CLINICAL DATA:  Central line complication. Concern for location of central line. EXAM: PORTABLE CHEST 1 VIEW COMPARISON:  Prior chest radiograph 02/11/2021. FINDINGS: Right IJ approach central venous catheter with tip projecting in the region of the mid to upper SVC. Shallow inspiration radiograph. Heart size within normal limits. As before, there is subsegmental atelectasis within both lung bases. No appreciable pleural effusion or evidence of pneumothorax. No acute bony abnormality identified. IMPRESSION: Right IJ approach central venous catheter with tip projecting in the region of the mid to upper SVC. Shallow inspiration radiograph. Redemonstrated bibasilar subsegmental atelectasis. Electronically Signed   By: Kellie Simmering DO   On: 02/12/2021 13:40   DG Chest Port 1 View  Result Date: 02/09/2021 CLINICAL DATA:  Central line placement EXAM: PORTABLE CHEST 1 VIEW COMPARISON:  Portable exam 1346 hours compared to 0842 hours FINDINGS: RIGHT jugular central venous catheter with tip projecting over SVC. Normal heart size, mediastinal contours, and pulmonary vascularity. LEFT basilar atelectasis. Remaining lungs clear. No pulmonary infiltrate, pleural effusion, or pneumothorax. Osseous structures unremarkable. IMPRESSION: No pneumothorax following RIGHT jugular line placement. Subsegmental atelectasis LEFT base. Electronically Signed   By: Lavonia Dana M.D.   On: 02/09/2021 14:26   DG Chest Portable 1 View  Result  Date: 02/09/2021 CLINICAL DATA:  Shortness of breath EXAM: PORTABLE CHEST 1 VIEW COMPARISON:  Field FINDINGS: There is slight atelectasis left base. Lungs otherwise are clear. Heart size and pulmonary vascularity are normal. No adenopathy. No bone lesions. IMPRESSION: Mild left base atelectasis. Lungs elsewhere clear. Heart size normal. Electronically Signed   By: Lowella Grip III M.D.   On: 02/09/2021 08:55   ECHOCARDIOGRAM COMPLETE  Result Date: 02/11/2021    ECHOCARDIOGRAM REPORT   Patient Name:   NEGIN ROURKE Date of Exam: 02/11/2021 Medical Rec #:  [id number]       Height:       65.0 in Accession #:    [id number]      Weight:       238.3 lb Date of Birth:  December 18, 1970       BSA:          2.131 m Patient Age:    62 years        BP:           108/46 mmHg Patient Gender: F               HR:           70 bpm. 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  Sonographer:    Jonelle Sidle Dance Referring Phys: [place] Comments: No subcostal window. IMPRESSIONS  1. Left ventricular ejection fraction, by estimation, is 60 to 65%. The left ventricle has normal function. The left ventricle has no regional wall motion abnormalities. Left ventricular diastolic parameters were normal.  2. Right ventricular systolic function was not well visualized. The right ventricular size is not well visualized. Tricuspid regurgitation signal is inadequate for assessing PA pressure.  3. The mitral valve is normal in structure. No evidence of mitral valve regurgitation. No evidence of mitral stenosis.  4. The aortic valve is normal in structure. Aortic valve regurgitation is not visualized. No aortic stenosis is present.  5. The inferior vena cava is normal in size with greater than 50% respiratory  variability, suggesting right atrial pressure of 3 mmHg. FINDINGS  Left Ventricle: Left ventricular ejection fraction, by estimation, is 60 to 65%. The left ventricle has normal function. The left ventricle has no regional wall motion abnormalities. Definity contrast agent was given IV to delineate the left ventricular  endocardial borders. The left ventricular internal cavity size was normal in size. There is no left ventricular hypertrophy. Left ventricular diastolic parameters were normal. Normal left ventricular filling pressure. Right Ventricle: The right ventricular size is not well visualized. Right vetricular wall thickness was not assessed. Right ventricular systolic function was not well visualized. Tricuspid regurgitation signal is inadequate for assessing PA pressure. Left Atrium: Left atrial size was normal in size. Right Atrium: Right atrial size was normal in size. Pericardium: There is no evidence of pericardial effusion. Mitral Valve: The mitral valve is normal in structure. No evidence of mitral valve regurgitation. No evidence of mitral valve stenosis. Tricuspid Valve: The tricuspid valve is normal in structure. Tricuspid valve regurgitation is trivial. No evidence of tricuspid stenosis. Aortic Valve: The aortic valve is normal in structure. Aortic valve regurgitation is not visualized. No aortic stenosis is present. Pulmonic Valve: The pulmonic valve was normal in structure. Pulmonic valve regurgitation is not visualized. No evidence of pulmonic stenosis. Aorta: The aortic root is normal in size and structure. Venous: The inferior vena cava was not well visualized. The inferior vena cava is normal in size with greater than 50% respiratory variability, suggesting right atrial pressure of 3 mmHg. IAS/Shunts: No atrial level shunt detected by color flow Doppler.  LEFT VENTRICLE PLAX 2D LVIDd:         4.30 cm  Diastology LVIDs:         3.00 cm  LV e' medial:    8.81 cm/s LV PW:         1.00 cm  LV  E/e' medial:  9.5 LV IVS:        0.90 cm  LV e' lateral:   9.79 cm/s LVOT diam:     2.10 cm  LV E/e' lateral: 8.6 LV SV:         74 LV SV Index:   35 LVOT Area:     3.46 cm  LEFT ATRIUM             Index LA diam:        3.50 cm 1.64 cm/m LA Vol (A2C):   45.3 ml 21.26 ml/m LA Vol (A4C):   30.5 ml 14.31 ml/m LA Biplane Vol: 40.4 ml 18.96 ml/m  AORTIC VALVE LVOT Vmax:   133.00 cm/s LVOT Vmean:  78.900 cm/s LVOT VTI:    0.215 m  AORTA Ao Root diam: 3.10 cm Ao Asc diam:  2.80 cm MITRAL VALVE MV Area (PHT): 2.87 cm    SHUNTS MV Decel Time: 264 msec    Systemic VTI:  0.22 m MV E velocity: 83.90 cm/s  Systemic Diam: 2.10 cm MV A velocity: 81.90 cm/s MV E/A ratio:  1.02 Fransico Him MD Electronically  signed by Fransico Him MD Signature Date/Time: 02/11/2021/1:42:44 PM    Final    CT Angio Chest/Abd/Pel for Dissection W and/or W/WO  Result Date: 02/09/2021 CLINICAL DATA:  Lower abdominal pain, left leg numbness. EXAM: CT ANGIOGRAPHY CHEST, ABDOMEN AND PELVIS TECHNIQUE: Non-contrast CT of the chest was initially obtained. Multidetector CT imaging through the chest, abdomen and pelvis was performed using the standard protocol during bolus administration of intravenous contrast. Multiplanar reconstructed images and MIPs were obtained and reviewed to evaluate the vascular anatomy. CONTRAST:  162m OMNIPAQUE IOHEXOL 350 MG/ML SOLN COMPARISON:  None. 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 Heart is normal size. Aorta is normal caliber. No dissection no filling defects in the central pulmonary arteries to suggest pulmonary emboli. Mediastinum/Nodes: No mediastinal, hilar, or axillary adenopathy. Trachea and esophagus are unremarkable. Thyroid unremarkable. Lungs/Pleura: Linear densities at the left base in the lingula, likely scarring. No confluent opacities or effusions. Musculoskeletal: Chest wall soft tissues are unremarkable. No acute bony  abnormality. Review of the MIP images confirms the above findings. CTA ABDOMEN AND PELVIS FINDINGS VASCULAR Aorta: Normal caliber aorta without aneurysm, dissection, vasculitis or significant stenosis. Celiac: Patent without evidence of aneurysm, dissection, vasculitis or significant stenosis. SMA: Patent without evidence of aneurysm, dissection, vasculitis or significant stenosis. Renals: Both renal arteries are patent. No proximal stenosis. Both renal arteries appear small caliber in the renal hila. IMA: Very small, difficult to visualize.  Appears patent. Inflow: Patent without evidence of aneurysm, dissection, vasculitis or significant stenosis. Veins: No obvious venous abnormality within the limitations of this arterial phase study. Review of the MIP images confirms the above findings. NON-VASCULAR Hepatobiliary: No focal hepatic abnormality. Gallbladder unremarkable. Pancreas: No focal abnormality or ductal dilatation. Spleen: No focal abnormality.  Normal size. Adrenals/Urinary Tract: There are areas of wedge-shaped decreased perfusion noted throughout both kidneys. There is no surrounding perinephric stranding. No hydronephrosis. Adrenal glands and urinary bladder unremarkable. Stomach/Bowel: Stomach, large and small bowel grossly unremarkable. Small infraumbilical ventral hernia containing a small bowel loop. No evidence of bowel obstruction. Lymphatic: Aortic atherosclerosis. No evidence of aneurysm or adenopathy. Reproductive: Uterus and adnexa unremarkable.  No mass. Other: No free fluid or free air. Musculoskeletal: No acute bony abnormality. Degenerative changes in the lower lumbar spine. Review of the MIP images confirms the above findings. IMPRESSION: No evidence of aortic aneurysm or dissection. Proximal renal arteries are patent and free of disease. However, the vessels appear small caliber bilaterally within the renal hila. This is associated with extensive wedge-shaped areas of hypoperfusion  throughout both kidneys. This is felt to most likely reflect some form of microvascular disease or vasoconstriction. 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 This is less likely pyelonephritis given the extensive nature of the finding and lack of perinephric stranding. Small infraumbilical ventral hernia containing small bowel loop. No bowel obstruction. These results were called by telephone at the time of interpretation on 02/09/2021 at 11:10 am [facility] who verbally acknowledged these results. Electronically Signed   By: KRolm BaptiseM.D.   On: 02/09/2021 11:10   [place] LIMITED RUQ (LIVER/GB)  Result Date: 02/13/2021 CLINICAL DATA:  Acute cholecystitis. EXAM: ULTRASOUND ABDOMEN LIMITED RIGHT UPPER QUADRANT COMPARISON:  CT abdomen pelvis 02/12/2021. FINDINGS: Gallbladder: Numerous shadowing echogenic stones in the gallbladder, measuring up to 1.2 cm. Sludge is seen as well. No wall thickening. Negative sonographic Murphy sign. Common bile duct: Diameter: Suboptimally visualized due to body habitus, measures  approximately 3 mm. Liver: Suboptimally visualized due to body habitus. Diffusely increased in echogenicity. No definite focal lesion. Portal vein is patent on color Doppler imaging with normal direction of blood flow towards the liver. Other: None. IMPRESSION: 1. Cholelithiasis and sludge.  No evidence of acute cholecystitis. 2. Hepatic steatosis. Electronically Signed   By: Lorin Picket M.D.   On: 02/13/2021 14:16   US Abdomen Limited RUQ (LIVER/GB)  Result Date: 02/10/2021 CLINICAL DATA:  51 year old female with right upper quadrant abdominal pain. Dialysis patient. EXAM: ULTRASOUND ABDOMEN LIMITED RIGHT UPPER QUADRANT COMPARISON:  CTA chest and abdomen yesterday. FINDINGS: Gallbladder: Shadowing echogenic gallstones (image 5) individual stone size estimated at 15-20 mm. Gallbladder wall thickness remains normal. No pericholecystic fluid. Some  superimposed gallbladder sludge. However, positive sonographic Percell Miller sign is reported. Common bile duct: Diameter: 6-7 mm, upper limits of normal. Liver: No focal lesion identified. Within normal limits in parenchymal echogenicity. Portal vein is patent on color Doppler imaging with normal direction of blood flow towards the liver. Other: No free fluid. IMPRESSION: 1. Difficult to exclude acute cholecystitis: positive for gallstones, sludge, and a sonographic [employer]. But there is no evidence of gallbladder wall thickening. 2. Upper limits of normal CBD.  Liver parenchyma appears negative. Electronically Signed   By: Genevie Ann M.D.   On: 02/10/2021 09:45    Labs:  CBC: Recent Labs    02/10/21 0506 02/11/21 0402 02/12/21 0500 02/13/21 0559  WBC 21.8* 16.2* 18.8* 22.6*  HGB 12.6 10.5* 9.2* 7.9*  HCT 38.1 31.9* 28.4* 24.3*  PLT 201 151 133* 145*    COAGS: Recent Labs    02/11/21 0808 02/12/21 0500 02/12/21 1700 02/13/21 0559  INR  --   --  1.0  --   APTT 38* 39*  --  38*    BMP: Recent Labs    02/12/21 1320 02/12/21 1700 02/12/21 2058 02/13/21 0559  NA 129*  131* 131* 131* 132*  133*  K 4.7  4.7 4.9 4.8 4.6  4.6  CL 96*  97* 97* 98 99  99  CO2 '25  25 24 24 24  24  '$ GLUCOSE 127*  129* 141* 137* 116*  116*  BUN 20  20 23* 25* 34*  35*  CALCIUM 7.7*  7.8* 7.6* 7.5* 7.5*  7.5*  CREATININE 2.72*  2.56* 3.02* 3.44* 4.34*  4.22*  GFRNONAA 21*  22* 18* 16* 12*  12*    LIVER FUNCTION TESTS: Recent Labs    02/10/21 0506 02/10/21 1555 02/11/21 0402 02/11/21 1548 02/12/21 0500 02/12/21 1320 02/12/21 1700 02/13/21 0559  BILITOT 0.9  --  0.8  --  0.8  --   --  0.5  AST 1,303*  --  808*  --  661*  --   --  528*  ALT 524*  --  387*  --  307*  --   --  239*  ALKPHOS 124  --  99  --  90  --   --  99  PROT 7.0  --  6.4*  --  6.3*  --   --  6.1*  ALBUMIN 2.8*  2.9*   < > 2.4*  2.4*   < > 2.3*  2.4* 2.5* 2.6* 2.3*  2.3*   < > = values in this interval  not displayed.    TUMOR MARKERS: No results for input(s): AFPTM, CEA, CA199, CHROMGRNA in the last 8760 hours.  Assessment and Plan: 50 y.o. female who was admitted to [facility] on 02/09/2021 for rhabdomyolysis, hyperkalemia and inability to move her legs.  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  When she awoke she could not move and her husband was dead beside her.  She has since complained of abdominal pain, more so right-sided and previously had some nausea and vomiting.  She is currently on CRRT and has NG tube in place.  CT abdomen pelvis performed on 3/20 revealed high-grade small bowel obstruction with transition point within an infraumbilical ventral abdominal wall hernia.  Due to persistent right upper quadrant abdominal pain ultrasound was performed today which revealed cholelithiasis and sludge.  There was no evidence of acute cholecystitis.  Fatty liver changes noted.  Myositis as well as disc and facet degeneration noted on spinal imaging.  Patient currently afebrile, WBC 22.6, hemoglobin 7.9, platelets 145k, creatinine 4.22, total bilirubin 0.5, ALT/AST elevated (? shock liver), PT/INR normal, COVID-19 negative.  Request received from surgical team for consideration of possible percutaneous cholecystostomy. Pt on IV Zosyn.  Imaging studies have been reviewed by Dr. Annamaria Boots. Pt unable to undergo HIDA as she can't go 6 hours without pain meds at this time. C[facility] has high clinical suspicion that pt has acute cholecystitis despite neg imaging. Details/risks of procedure, including but not limited to, internal bleeding, infection, injury to adjacent structures, inability to eradicate abdominal pain, need for long term drainage d/w pt with her understanding and consent. Will cont to monitor pt for now and tent place on schedule/re-evaluate pt on  3/22.    Thank you for this interesting consult.  I greatly enjoyed meeting Renee Wilkinson and look  forward to participating in their care.  A copy of this report was sent to the requesting provider on this date.  Electronically Signed: D. Rowe Robert, PA-C 02/13/2021, 3:42 PM   I spent a total of 30 minutes in face to face in clinical consultation, greater than 50% of which was counseling/coordinating care for possible percutaneous cholecystostomy

## 2021-02-13 NOTE — Progress Notes (Signed)
NAME:  Renee Wilkinson, MRN:  [id number], DOB:  [birth year], LOS: 4 ADMISSION DATE:  02/09/2021, CONSULTATION DATE:  3/17 REFERRING MD:  EDP, CHIEF COMPLAINT:  Abdominal pain   Brief History:  50 year old admits to cocaine use who was found down beside that husband admitted with hyperkalemia, renal failure, shock liver with chief complaint of abdominal pain with imaging consistent with renal infarcts. Admitted to ICU, intubated, CRRT.  Pertinent  Medical History  Psychosis with behavioral health admit in 2021  Significant Hospital Events: Including procedures, antibiotic start and stop dates in addition to other pertinent events   . Admitted to ICU 3/17, VDRF, started CRRT . 3/17 ETT >3/17 . 3/17 R IJ HD cath, exchanged over wire on 3/19>  . 3/18 more awake, mottling of feet, numbness left leg> concern for compartment syndrome, consulted orthopedics, no compartment syndrome, started on neosynephrine overnight, psychiatry consult . 3/18 RUQ ultrasound > positive murphy's but no GB wall thickening; there are stones and sludge present, upper limits of normal CBD . 3/19 coox 55, awake, started on levophed . 3/19 TTE> LVEF 60-65%, RV OK . 3/20 off vasopressors, still has left leg pain/weakness . 3/20 General surgery consult for small bowel obstruction through ventral hernia . 3/20 CT abdomen/pelvis> Ventral hernia, small bowel obstruction . 3/20 CT thoracic/lumbar spine> advanced bilateral facet arthropathy with gaping joints which would worsen with statnding or flexion, could have spinal compression at this level . 3/20 HD catheter pulled out, held CRRT . 3/20 Neuro consult for left leg weakness and numbness> rec MRI lumbar spine  Interim History / Subjective:   . 3/20 off vasopressors, still has left leg pain/weakness . 3/20 General surgery consult for small bowel obstruction through ventral hernia . 3/20 CT abdomen/pelvis> Ventral hernia, small bowel obstruction . 3/20 HD catheter pulled  out, held CRRT . 3/20 Neuro consult for left leg weakness and numbness> rec MRI lumbar spine . Low grade temp again   Objective   Blood pressure 129/65, pulse (!) 107, temperature 98.2 F (36.8 C), temperature source Oral, resp. rate 19, height '5\' 5"'$  (1.651 m), weight 108 kg, SpO2 95 %. CVP:  [8 mmHg] 8 mmHg      Intake/Output Summary (Last 24 hours) at 02/13/2021 0724 Last data filed at 02/13/2021 0400 Gross per 24 hour  Intake 1683.78 ml  Output 1763 ml  Net -79.22 ml   Filed Weights   02/10/21 0500 02/12/21 0448 02/13/21 0500  Weight: 108.1 kg 108 kg 108 kg    Examination:  General:  Resting comfortably in bed HENT: NCAT OP clear PULM: CTA B, normal effort CV: RRR, no mgr GI: no bowel sounds, mildly tender RUQ MSK: normal bulk and tone Neuro: awake, alert, no distress, still has weakness left leg   Labs/imaging personally reviewed   3/17 SARS COV 2/Flu > neg 3/17 blood cx >   Resolved Hospital Problem list   Acute respiratory failure with hypoxemia due to inability to protect airway Mild hypotension/crrt related?  Assessment & Plan:  Acute metabolic encephalopathy due to drug overdose> resolved Need for sedation for mechanical ventilation > resolved Suicidal ideation> contracts for safety, no plan, has been seen by psyche who feels she doesn't need inpatient care Acute grief> husband died Schizophrenia Depression Use home olanzapine, zoloft, trazodone Prn xanax Minimize sedation F/u with psyche after discharge  Fever, rising WBC> related to SBO? GB sludge? Add zosyn Discuss HIDA with general surgery today Planning to go to OR this week  Left leg numbness/weakness  Lumbar spinal stenosis and disc buldging MRI spine F/u neuro recs  AKI, rhabdomyolysis left leg/hip > pulled out HD cath Renal infarct Replace HD cath today Monitor BMET and UOP Replace electrolytes as needed  Shock liver> improving LFT on 3/20 RUQ pain 3/18 > better 3/19, persistent  3/20, due to shock liver? GB stones and sludge APAP negative on admission SBO Discuss GB with surgery, plan for OR this week NG to LWS F/u general surgery recommendations  Demand ischemia after being found down post narcotic overdose Echo OK, she does not have cardiogenic shock Shock has resolved Monitor hemodynamics tele   Best practice (evaluated daily)  Diet:  NPO Pain/Anxiety/Delirium protocol (if indicated): No VAP protocol (if indicated): Not indicated DVT prophylaxis: Subcutaneous Heparin GI prophylaxis: N/A Glucose control:  SSI No Central venous access:  Yes, and it is still needed Arterial line:  N/A Foley:  Yes, and it is still needed Mobility:  OOB  PT consulted: Yes Last date of multidisciplinary goals of care discussion [3/17] I updated her daughter by phone on 3/19 Code Status:  full code Disposition: remain in ICU  Labs   CBC: Recent Labs  Lab 02/09/21 0830 02/10/21 0506 02/11/21 0402 02/12/21 0500 02/13/21 0559  WBC 30.6* 21.8* 16.2* 18.8* 22.6*  NEUTROABS 21.0*  --  11.7*  --  12.9*  HGB 15.9* 12.6 10.5* 9.2* 7.9*  HCT 49.5* 38.1 31.9* 28.4* 24.3*  MCV 91.2 87.2 88.1 89.9 90.0  PLT 370 201 151 133* 145*    Basic Metabolic Panel: Recent Labs  Lab 02/09/21 0845 02/09/21 1200 02/10/21 0506 02/10/21 1144 02/11/21 0402 02/11/21 1138 02/11/21 1548 02/11/21 1930 02/12/21 0500 02/12/21 1320 02/12/21 1700 02/12/21 2058 02/13/21 0559  NA  --    < > 129*  130*   < > 129*  129*   < > 133*   < > 130*  130* 129*  131* 131* 131* 132*  133*  K  --    < > 5.2*  5.2*   < > 5.2*  5.2*   < > 4.6   < > 4.8  4.8 4.7  4.7 4.9 4.8 4.6  4.6  CL  --    < > 91*  91*   < > 94*  94*   < > 101   < > 97*  97* 96*  97* 97* 98 99  99  CO2  --    < > 25  26   < > 27  26   < > 25   < > '25  24 25  25 24 24 24  24  '$ GLUCOSE  --    < > 166*  166*   < > 153*  154*   < > 115*   < > 130*  129* 127*  129* 141* 137* 116*  116*  BUN  --    < > 36*   34*   < > 23*  23*   < > 27*   < > '20  20 20  20 '$ 23* 25* 34*  35*  CREATININE  --    < > 2.54*  2.58*   < > 2.24*  2.27*   < > 2.52*   < > 2.46*  2.46* 2.72*  2.56* 3.02* 3.44* 4.34*  4.22*  CALCIUM  --    < > 6.7*  6.8*   < > 7.4*  7.4*   < > 6.8*   < > 7.6*  7.6* 7.7*  7.8* 7.6* 7.5* 7.5*  7.5*  MG 3.1*  --  2.3  --  2.4  --   --   --  2.4  --   --   --  2.7*  PHOS 10.7*   < > 5.8*   < > 3.4  --  2.5  --  2.7 2.7 3.1  --  4.4   < > = values in this interval not displayed.   GFR: Estimated Creatinine Clearance: 18.9 mL/min (A) (by C-G formula based on SCr of 4.34 mg/dL (H)). Recent Labs  Lab 02/09/21 0845 02/09/21 1108 02/10/21 0506 02/11/21 0402 02/12/21 0500 02/13/21 0559  WBC  --   --  21.8* 16.2* 18.8* 22.6*  LATICACIDVEN 4.4* 4.0*  --   --   --   --     Liver Function Tests: Recent Labs  Lab 02/09/21 0830 02/09/21 1535 02/10/21 0506 02/10/21 1555 02/11/21 0402 02/11/21 1548 02/12/21 0500 02/12/21 1320 02/12/21 1700 02/13/21 0559  AST 1,411*  --  1,303*  --  808*  --  661*  --   --  528*  ALT 467*  --  524*  --  387*  --  307*  --   --  239*  ALKPHOS 199*  --  124  --  99  --  90  --   --  99  BILITOT 0.7  --  0.9  --  0.8  --  0.8  --   --  0.5  PROT 9.6*  --  7.0  --  6.4*  --  6.3*  --   --  6.1*  ALBUMIN 4.0   < > 2.8*  2.9*   < > 2.4*  2.4* 2.2* 2.3*  2.4* 2.5* 2.6* 2.3*  2.3*   < > = values in this interval not displayed.   No results for input(s): LIPASE, AMYLASE in the last 168 hours. No results for input(s): AMMONIA in the last 168 hours.  ABG    Component Value Date/Time   HCO3 14.7 (L) 02/09/2021 1105   ACIDBASEDEF 12.7 (H) 02/09/2021 1105   O2SAT 55.9 02/11/2021 0938     Coagulation Profile: Recent Labs  Lab 02/12/21 1700  INR 1.0    Cardiac Enzymes: Recent Labs  Lab 02/09/21 0830  CKTOTAL >50,000*    HbA1C: No results found for: HGBA1C  CBG: Recent Labs  Lab 02/09/21 1522  GLUCAP 158*     Critical care  time: 35 minutes     [name], MD [place] Pager: [phone] Cell: [phone] If no response, please call [phone] until 7pm After 7:00 pm call Elink  [phone]

## 2021-02-13 NOTE — Consult Note (Signed)
NEUROLOGY CONSULTATION NOTE   Date of service: February 13, 2021 Patient Name: Renee Wilkinson MRN:  [id number] DOB:  July 06, 1971 Reason for consult: "Left lower ext weakness" _ _ _   _ __   _ __ _ _  __ __   _ __   __ _  History of Present Illness  Renee Wilkinson is a 50 y.o. female admitted on 02/09/2021 for rhabdomyolysis with hyperkalemia and inability to move her left leg with purplish discoloration of her left leg.  Patient endorsed cocaine use alongside her husband, she passed out and woke up to find her husband dead next to her. She could not move her legs. Screamed for 6 hours before neighbors called EMS and brought in to the hospital.  Patient reports that she woke up, she found herself pinned between a metal bed and the floor. She reports that the bed was seemed like going into her Right chest and ribs, with her left leg and buttock down on the floor. She tried her best but could not move out. She was lying this way for more than 6 hours before EMS rescued her. Immediately noted sensation of pins and needles in her left leg with pain. Endorses burning with urination. Endorses constipation.  She reports tiredness and generalized weakness for months prior to this but was able to walk fine, able to lift milk gallon jugs and take care of herself fine.  In the ED, noted to have left leg weakness and numbness along with dark purplish left toes. Per EDP Notes, she had decreased sensation in the entire left leg, most notably in the left foot.  Labs with significantly elevated CK > 50,000, hyperkalemia > 7.5, leukocytosis with WBC count, transaminitis and concern for shock liver. Imaging with bilateral renal infarcts likely secondary to microvascular vasoconstriction.  She was started on CRRT.  On 3/18 in the afternoon. She was evaluated by ICU team again for worsening left foot palor, mottling. Per notes, exam with mottling left foot; no sensation to light touch in the left leg, she can feel  deep palpation/pressure, no cutaneous sensation from foot to mid thigh in left leg.  DP/PT pulses left leg weak, still present; skin not taught, significant adipose tissue noted. Ortho evaluated patient and no conerns for compartment syndrome.  On 3/19, notes show an improvement in the numbness of the left leg but continued to have left leg pain and weakness. She had imaging with CT of her T and L spine which was notable for L4-5: Advanced bilateral facet arthropathy with gaping joints. 2 mm of anterolisthesis, which would likely worsen with standing or flexion. Bulging of the disc. Multifactorial stenosis that would likely worsen with standing or flexion. Neural compression could occur at this level.  We were asked to assess her for the noted stenosis potentially contributing to her weakness.   ROS   Constitutional Denies weight loss, fever and chills.  HEENT Denies changes in vision and hearing.  Respiratory Denies SOB and cough.  CV Denies palpitations and CP  GI + abdominal pain, nausea, no vomiting and diarrhea.  GU + dysuria and urinary frequency.   MSK Denies myalgia and joint pain.  Skin Denies rash and pruritus.  Neurological + headache but no syncope.  Psychiatric + recent changes in mood. + anxiety and depression.    Past History  History reviewed. No pertinent past medical history.  No family history on file. Social History   Socioeconomic History  . Marital status: Widowed  Spouse name: Not on file  . Number of children: Not on file  . Years of education: Not on file  . Highest education level: Not on file  Occupational History  . Not on file  Tobacco Use  . Smoking status: Not on file  . Smokeless tobacco: Not on file  Substance and Sexual Activity  . Alcohol use: Not on file  . Drug use: Not on file  . Sexual activity: Not on file  Other Topics Concern  . Not on file  Social History Narrative  . Not on file   Social Determinants of Health   Financial  Resource Strain: Not on file  Food Insecurity: Not on file  Transportation Needs: Not on file  Physical Activity: Not on file  Stress: Not on file  Social Connections: Not on file   No Known Allergies  Medications   Medications Prior to Admission  Medication Sig Dispense Refill Last Dose  . ibuprofen (ADVIL) 200 MG tablet Take 200 mg by mouth every 6 (six) hours as needed for mild pain.   Past Week at Unknown time     Vitals   Vitals:   02/13/21 0000 02/13/21 0100 02/13/21 0200 02/13/21 0300  BP: 140/75 (!) 142/88 (!) 143/70 128/78  Pulse: (!) 111 (!) 113 (!) 108 87  Resp: '19 19 19 '$ (!) 21  Temp:      TempSrc:      SpO2: 97% 91% 93% 90%  Weight:      Height:         Body mass index is 39.62 kg/m.  Physical Exam   General: Laying comfortably in bed; in no acute distress. HENT: Normal oropharynx and mucosa. Normal external appearance of ears and nose. Neck: Supple, no pain or tenderness CV: No JVD. No peripheral edema. Pulmonary: Symmetric Chest rise. Normal respiratory effort. Abdomen: Soft to touch, non-tender. Ext: No cyanosis, edema, or deformity Skin: No rash. Normal palpation of skin.  Musculoskeletal: Normal digits and nails by inspection. No clubbing.  Neurologic Examination  Mental status/Cognition: Alert, oriented to self, place, month and year, good attention. Speech/language: Fluent, comprehension intact, object naming intact, repetition intact. Cranial nerves:   CN II Pupils equal and reactive to light, no VF deficits   CN III,IV,VI EOM intact, no gaze preference or deviation, no nystagmus   CN V normal sensation in V1, V2, and V3 segments bilaterally   CN VII no asymmetry, no nasolabial fold flattening   CN VIII normal hearing to speech   CN IX & X normal palatal elevation, no uvular deviation   CN XI 5/5 head turn and 5/5 shoulder shrug bilaterally   CN XII midline tongue protrusion   Motor:  Muscle bulk: normal, tone normal, pronator drift none  tremor none Mvmt Root Nerve  Muscle Right Left Comments  SA C5/6 Ax Deltoid 4 4   EF C5/6 Mc Biceps 4 4   EE C6/7/8 Rad Triceps 4 4   WF C6/7 Med FCR     WE C7/8 PIN ECU     F Ab C8/T1 U ADM/FDI 4 4   HF L1/2/3 Fem Illopsoas 3 2   KE L2/3/4 Fem Quad 4 3   DF L4/5 D Peron Tib Ant 4 3   PF S1/2 Tibial Grc/Sol 4 3    Reflexes:  Right Left Comments  Pectoralis      Biceps (C5/6) 2+ 2+   Brachioradialis (C5/6) 2+ 2+    Triceps (C6/7) 2+ 2+  Patellar (L3/4) 3 3 croass adductors +   Achilles (S1) 2 2    Hoffman      Plantar equivocal equivocal   Jaw jerk    Sensation:  Light touch Decreased only on the left from T4 below. Intact on the right   Pin prick Decreased only on the left from T4 below. Intact on the right   Temperature    Vibration Decreased in left foot.  Proprioception    Coordination/Complex Motor:  - Finger to Nose intact BL - Heel to shin unable to do. - Rapid alternating movement are slowed - Gait: Unable to assess given the extent of weakness.  Labs   CBC:  Recent Labs  Lab 02/09/21 0830 02/10/21 0506 02/11/21 0402 02/12/21 0500  WBC 30.6*   < > 16.2* 18.8*  NEUTROABS 21.0*  --  11.7*  --   HGB 15.9*   < > 10.5* 9.2*  HCT 49.5*   < > 31.9* 28.4*  MCV 91.2   < > 88.1 89.9  PLT 370   < > 151 133*   < > = values in this interval not displayed.    Basic Metabolic Panel:  Lab Results  Component Value Date   NA 131 (L) 02/12/2021   K 4.8 02/12/2021   CO2 24 02/12/2021   GLUCOSE 137 (H) 02/12/2021   BUN 25 (H) 02/12/2021   CREATININE 3.44 (H) 02/12/2021   CALCIUM 7.5 (L) 02/12/2021   GFRNONAA 16 (L) 02/12/2021   Lipid Panel: No results found for: LDLCALC HgbA1c: No results found for: HGBA1C Urine Drug Screen:     Component Value Date/Time   LABOPIA NONE DETECTED 02/09/2021 1221   COCAINSCRNUR NONE DETECTED 02/09/2021 1221   LABBENZ NONE DETECTED 02/09/2021 1221   AMPHETMU NONE DETECTED 02/09/2021 1221   THCU NONE DETECTED 02/09/2021  1221   LABBARB NONE DETECTED 02/09/2021 1221    Alcohol Level     Component Value Date/Time   ETH <10 02/09/2021 0830    CT Head without contrast: CTH was negative for a large hypodensity concerning for a large territory infarct or hyperdensity concerning for an ICH.  CT T and L spine: Increased kyphotic curvature. Chronic degenerative disc disease from T3-4 through T9-10. No apparent bony stenosis of the canal or foramina. No finding to suggest bone infection or disc space Infection.  1. L4-5: Advanced bilateral facet arthropathy with gaping joints. 2 mm of anterolisthesis, which would likely worsen with standing or flexion. Bulging of the disc. Multifactorial stenosis that would likely worsen with standing or flexion. Neural compression could occur at this level. 2. L5-S1: Chronic disc degeneration with loss of disc height. Endplate osteophytes and bulging of the disc. Bilateral foraminal stenosis could affect either L5 nerve. 3. 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 4. No finding to suggest regional spinal infection.   MRI C, T, L spine w/o contrast: pending.  Impression   Renee Wilkinson is a 50 y.o. female admitted on 02/09/2021 for rhabdomyolysis with hyperkalemia and inability to move her left leg with purplish discoloration of her left leg after she was pinned between a metal bed and the floor. Her neurologic examination is notable for symmetric BL upper ext weakness and left worse than right lower ext weakness. She also has diffuse hyperreflexia and left sided tingling/numbness to touch, pinprick from T4 below. Exam is also complicated by her being in pain and poor effort due to pain and lethargy. CTs of her T and L spine demonstrate kyphosis,  gaping joints at L4-5 with bulging disc and concern that stenosis noted would worsen with standing of flexion.  Difficult to rule out a central cause of her symptoms due to poor ability to participate in full  neuro exam and pain.  Recommendations  - MRI C, T, L spine without contrast. ______________________________________________________________________   Thank you for the opportunity to take part in the care of this patient. If you have any further questions, please contact the neurology consultation attending.  Signed,  [place] Pager Number [phone] _ _ _   _ __   _ __ _ _  __ __   _ __   __ _

## 2021-02-13 NOTE — Progress Notes (Signed)
[facility] Progress Note  Subjective: pt seen in room, no new c/o's, minimal UOP yest  Vitals:   02/13/21 0700 02/13/21 0800 02/13/21 0947 02/13/21 1000  BP: 129/65 122/60 136/69 130/74  Pulse: (!) 107 (!) 104 (!) 105 (!) 103  Resp: 19 19    Temp:  98.2 F (36.8 C)    TempSrc:  Oral    SpO2: 95% 92% 90% 91%  Weight:      Height:        Exam:  alert, nad   no jvd  Chest cta bilat  Cor reg no RG  Abd soft ntnd no ascites   GU foley w/ minimal amts brown urine   Ext 1-2+ LE edema bilat L> R   Alert, NF, ox3     Assessment/ Plan: 1. Renal-Acute Kidney Injury with CK 50 K, CTA abdomen showed likely extensive bilateral renal infarcts. No bacteremia or afib here though. CRRT started here on 2/17. Getting 4K dialysate, heparin at 500 u/ hr flat rate. Making little urine still. Will resume CRRT and consider transfer to [facility] for regular iHD.     2. Hypertension/volume: cont net negative UF 50-100 mL/ hr 3. Metabolic acidosis Improved 4. Rhabdomyolysis: was down for > 6 hrs 5. Abd pain: RUQ US showing gallbladder sludge.  Per primary  Ct abd/ pelvis today to evaluate ongoing abd pain and leg numbess 6. Substance abuse: her husband is deceased following the incident.  Spiritual care involved and psychiatry consulted  7. Shock: resolved, off levophed, trop peak > 4000, TTE with EF 60-65% and no WMA, RV OK 8. Dispo: remains in ICU   Kelly Splinter, MD 02/13/2021, 12:30 PM      Recent Labs  Lab 02/12/21 0500 02/12/21 1320 02/12/21 1700 02/12/21 2058 02/13/21 0559  K 4.8  4.8   < > 4.9 4.8 4.6  4.6  BUN 20  20   < > 23* 25* 34*  35*  CREATININE 2.46*  2.46*   < > 3.02* 3.44* 4.34*  4.22*  CALCIUM 7.6*  7.6*   < > 7.6* 7.5* 7.5*  7.5*  PHOS 2.7   < > 3.1  --  4.4  HGB 9.2*  --   --   --  7.9*   < > = values in this interval not displayed.   Inpatient medications: . Chlorhexidine Gluconate Cloth  6 each Topical Q0600  . famotidine  20 mg Per Tube  Daily  . heparin  5,000 Units Subcutaneous Q8H  . mouth rinse  15 mL Mouth Rinse BID  . mupirocin ointment  1 application Nasal BID  . OLANZapine  5 mg Per Tube QHS  . sertraline  50 mg Oral Daily  . sodium chloride flush  10-40 mL Intracatheter Q12H  . traZODone  150 mg Oral QHS   .  prismasol BGK 4/2.5 400 mL/hr at 02/12/21 0103  .  prismasol BGK 4/2.5 200 mL/hr at 02/12/21 0103  . sodium chloride 75 mL/hr at 02/13/21 1000  . dexmedetomidine (PRECEDEX) IV infusion Stopped (02/12/21 0959)  . heparin 10,000 units/ 20 mL infusion syringe 500 Units/hr (02/11/21 1232)  . norepinephrine (LEVOPHED) Adult infusion Stopped (02/11/21 1407)  . piperacillin-tazobactam (ZOSYN)  IV    . prismasol BGK 4/2.5 2,000 mL/hr at 02/12/21 1123   ALPRAZolam, opium-belladonna, docusate, heparin, HYDROmorphone (DILAUDID) injection, ondansetron (ZOFRAN) IV, oxyCODONE, sodium chloride, sodium chloride flush

## 2021-02-13 NOTE — TOC Progression Note (Signed)
Transition of Care [facility]) - Progression Note    Patient Details  Name: Renee Wilkinson MRN: [id number] Date of Birth: [birth year]  Transition of Care [facility]) CM/SW Contact  Leeroy Cha, RN Phone Number: 02/13/2021, 8:35 AM  Clinical Narrative:     50 yo F who was admitted 02/09/21 for rhabdomyolysis, hyperkalemia and inability to move her legs.  She and spouse used cocaine/oral narcotics and she passed out.  When she awoke, she couldn't move and her husband was dead beside her.  She screamed for 6 hours until she got help.  She had CRRT for several days.  She also had right sided abdominal pain that was felt to either be renal infarct or shock liver.   Given the Right sided pain, RUQ u/s was performed, no GB wall thickening, but stones and sludge present.  Transaminases elevated, but alk phos and t bili were ok.  She had episode of emesis today.   Today lumbar spine CT was done as she still had issues moving LLE.  Upon starting the scan she was seen to have dilated bowel so a CT abdomen was also performed.  She had NGT placed.  She continues to deny pain other than the right lateral abdomen with the RUQ being the worst.   CRRT, Iv precedex, Iv levophed, iv zosyn, wbc-22.6, hgb 7.9, NA132, bun 34, creat 4.34 PLAN:CIR following  Expected Discharge Plan: Home/Self Care Barriers to Discharge: Continued Medical Work up  Expected Discharge Plan and Services Expected Discharge Plan: Home/Self Care   Discharge Planning Services: CM Consult   Living arrangements for the past 2 months: No permanent address,Homeless Shelter                                       Social Determinants of Health (SDOH) Interventions    Readmission Risk Interventions No flowsheet data found.

## 2021-02-14 ENCOUNTER — Inpatient Hospital Stay (HOSPITAL_COMMUNITY): Payer: Medicaid - Out of State

## 2021-02-14 DIAGNOSIS — R1011 Right upper quadrant pain: Secondary | ICD-10-CM

## 2021-02-14 DIAGNOSIS — K810 Acute cholecystitis: Secondary | ICD-10-CM | POA: Diagnosis not present

## 2021-02-14 DIAGNOSIS — Z95828 Presence of other vascular implants and grafts: Secondary | ICD-10-CM | POA: Diagnosis not present

## 2021-02-14 DIAGNOSIS — M6282 Rhabdomyolysis: Secondary | ICD-10-CM | POA: Diagnosis not present

## 2021-02-14 LAB — RENAL FUNCTION PANEL
Albumin: 2.1 g/dL — ABNORMAL LOW (ref 3.5–5.0)
Albumin: 2.1 g/dL — ABNORMAL LOW (ref 3.5–5.0)
Anion gap: 10 (ref 5–15)
Anion gap: 10 (ref 5–15)
BUN: 29 mg/dL — ABNORMAL HIGH (ref 6–20)
BUN: 31 mg/dL — ABNORMAL HIGH (ref 6–20)
CO2: 22 mmol/L (ref 22–32)
CO2: 23 mmol/L (ref 22–32)
Calcium: 7.5 mg/dL — ABNORMAL LOW (ref 8.9–10.3)
Calcium: 7.7 mg/dL — ABNORMAL LOW (ref 8.9–10.3)
Chloride: 102 mmol/L (ref 98–111)
Chloride: 102 mmol/L (ref 98–111)
Creatinine, Ser: 3.26 mg/dL — ABNORMAL HIGH (ref 0.44–1.00)
Creatinine, Ser: 3.55 mg/dL — ABNORMAL HIGH (ref 0.44–1.00)
GFR, Estimated: 17 mL/min — ABNORMAL LOW (ref >60)
GFR, Estimated: 15 mL/min — ABNORMAL LOW
Glucose, Bld: 102 mg/dL — ABNORMAL HIGH (ref 70–99)
Glucose, Bld: 98 mg/dL (ref 70–99)
Phosphorus: 3.2 mg/dL (ref 2.5–4.6)
Phosphorus: 3.8 mg/dL (ref 2.5–4.6)
Potassium: 4.6 mmol/L (ref 3.5–5.1)
Potassium: 4.4 mmol/L (ref 3.5–5.1)
Sodium: 134 mmol/L — ABNORMAL LOW (ref 135–145)
Sodium: 135 mmol/L (ref 135–145)

## 2021-02-14 LAB — HEPATIC FUNCTION PANEL
ALT: 187 U/L — ABNORMAL HIGH (ref 0–44)
AST: 372 U/L — ABNORMAL HIGH (ref 15–41)
Albumin: 2.2 g/dL — ABNORMAL LOW (ref 3.5–5.0)
Alkaline Phosphatase: 89 U/L (ref 38–126)
Bilirubin, Direct: 0.1 mg/dL (ref 0.0–0.2)
Indirect Bilirubin: 1 mg/dL — ABNORMAL HIGH (ref 0.3–0.9)
Total Bilirubin: 1.1 mg/dL (ref 0.3–1.2)
Total Protein: 5.8 g/dL — ABNORMAL LOW (ref 6.5–8.1)

## 2021-02-14 LAB — CBC
HCT: 22.4 % — ABNORMAL LOW (ref 36.0–46.0)
Hemoglobin: 7.1 g/dL — ABNORMAL LOW (ref 12.0–15.0)
MCH: 29.1 pg (ref 26.0–34.0)
MCHC: 31.7 g/dL (ref 30.0–36.0)
MCV: 91.8 fL (ref 80.0–100.0)
Platelets: 151 10*3/uL (ref 150–400)
RBC: 2.44 MIL/uL — ABNORMAL LOW (ref 3.87–5.11)
RDW: 14.5 % (ref 11.5–15.5)
WBC: 19.2 10*3/uL — ABNORMAL HIGH (ref 4.0–10.5)
nRBC: 0.2 % (ref 0.0–0.2)

## 2021-02-14 LAB — CULTURE, BLOOD (SINGLE)
Culture: NO GROWTH
Special Requests: ADEQUATE

## 2021-02-14 LAB — APTT: aPTT: 35 s (ref 24–36)

## 2021-02-14 LAB — MAGNESIUM: Magnesium: 2.5 mg/dL — ABNORMAL HIGH (ref 1.7–2.4)

## 2021-02-14 IMAGING — DX DG CHEST 1V
1 series · 1 of 1 positions shown · non-contrast
Comparison: [AN] hours the same day and earlier.

CLINICAL DATA: 49-year-old female enteric tube advanced.

EXAM:
CHEST  1 VIEW

[abdomen erect]
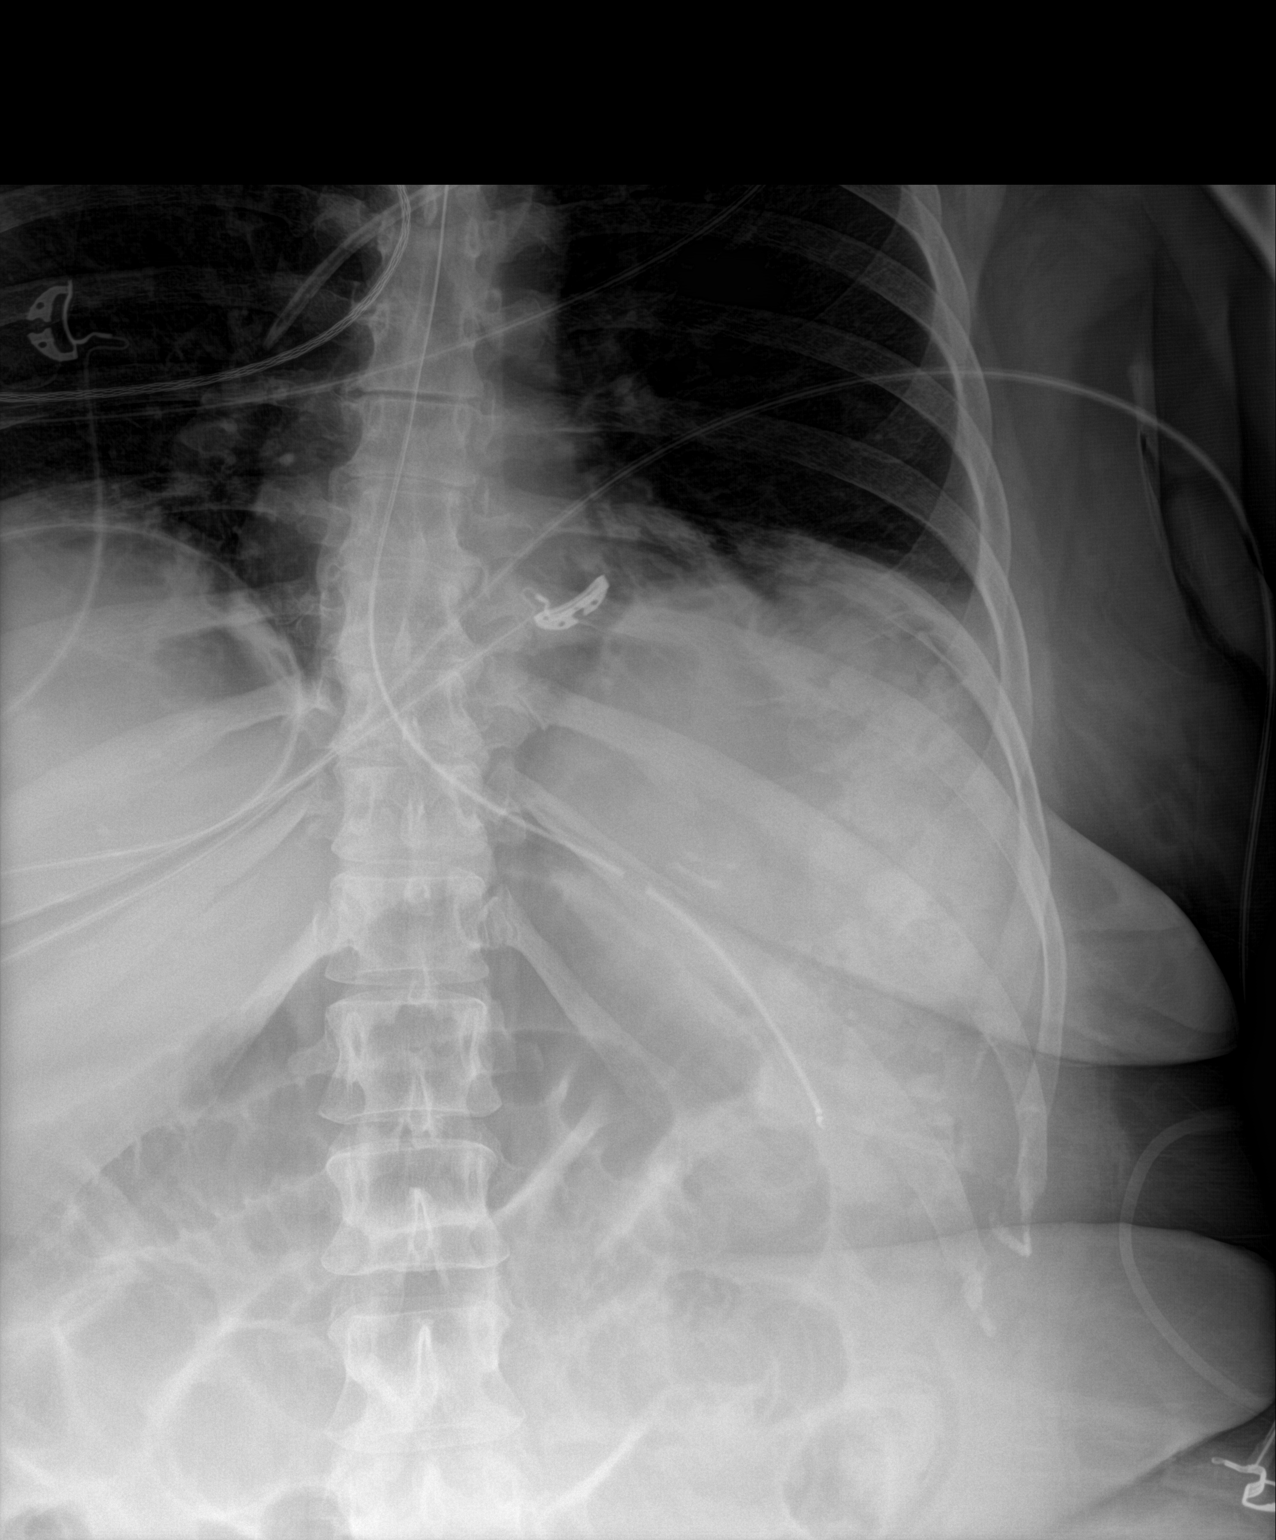

[1 of 1 positions shown; findings below may reference images not displayed]

FINDINGS: Portable AP semi upright view at [AN] hours. Enteric tube advanced
further into the stomach, side hole now the level of the gastric
body. Continued gas-filled small bowel loops in the mid abdomen. Low
lung volumes. Partially visible left neck approach central line. No
acute osseous abnormality identified.
IMPRESSION: 1. Enteric tube advanced with side hole now the level of the gastric
body.
2. Continued gas pattern suspicious for small bowel obstruction or
ileus.

## 2021-02-14 IMAGING — NM NM HEPATOBILIARY IMAGE, INC GB
3 series · 18 of 18 positions shown · non-contrast
Comparison: CT AP [DATE] and right upper quadrant sonogram
[DATE]

CLINICAL DATA: Evaluate for cholecystitis.

EXAM:
NUCLEAR MEDICINE HEPATOBILIARY IMAGING
TECHNIQUE: Sequential images of the abdomen were obtained [DATE] minutes
following intravenous administration of radiopharmaceutical.
RADIOPHARMACEUTICALS:  5.5 mCi [YX]  Choletec IV

[Series 1: morphine 30 min · 3.28mm/px · 6 of 30 frames shown]
[frame 3/30]
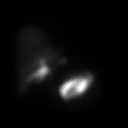
[frame 8/30]
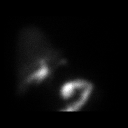
[frame 13/30]
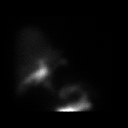
[frame 18/30]
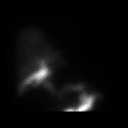
[frame 23/30]
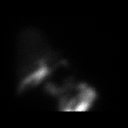
[frame 28/30]
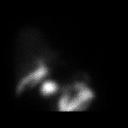

[Series 1: hida scan · 3.28mm/px · 6 of 18 frames shown]
[frame 2/18]
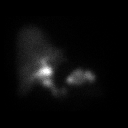
[frame 5/18]
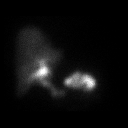
[frame 8/18]
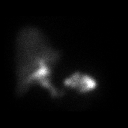
[frame 11/18]
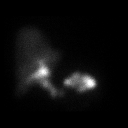
[frame 14/18]
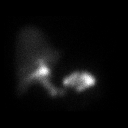
[frame 17/18]
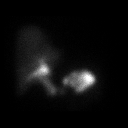

[Series 1: hida scan hr 1 · 3.28mm/px · 6 of 45 frames shown]
[frame 4/45]
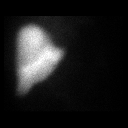
[frame 12/45]
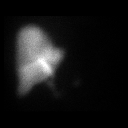
[frame 19/45]
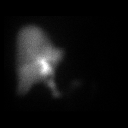
[frame 27/45]
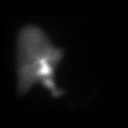
[frame 34/45]
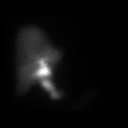
[frame 42/45]
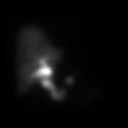

[18 of 18 positions shown; findings below may reference images not displayed]

FINDINGS: Prompt uptake and biliary excretion of activity by the liver is
seen. Prompt biliary activity passes into small bowel, consistent
with patent common bile duct.

After 60 minutes of dynamic imaging there is equivocal early filling
of the gallbladder. Subsequently the patient received 3 mg of
morphine sulfate, IV. Imaging was carried out for an additional 30
minutes with partial filling of the gallbladder confirming patency
of the cystic duct.
IMPRESSION: 1. Delayed filling of the gallbladder following IV administration of
morphine sulfate confirming patency of the cystic duct and excluding
acute cholecystitis. Imaging findings however may reflect chronic
cholecystitis.
2. Patent common bile duct with prompt biliary to bowel activity.

## 2021-02-14 IMAGING — DX DG ABD PORTABLE 1V
2 series · 2 of 2 positions shown · non-contrast
Comparison: Radiograph [DATE]

CLINICAL DATA: Small-bowel obstruction.

EXAM:
PORTABLE ABDOMEN - 1 VIEW

[abdomen kub (1 of 2)]
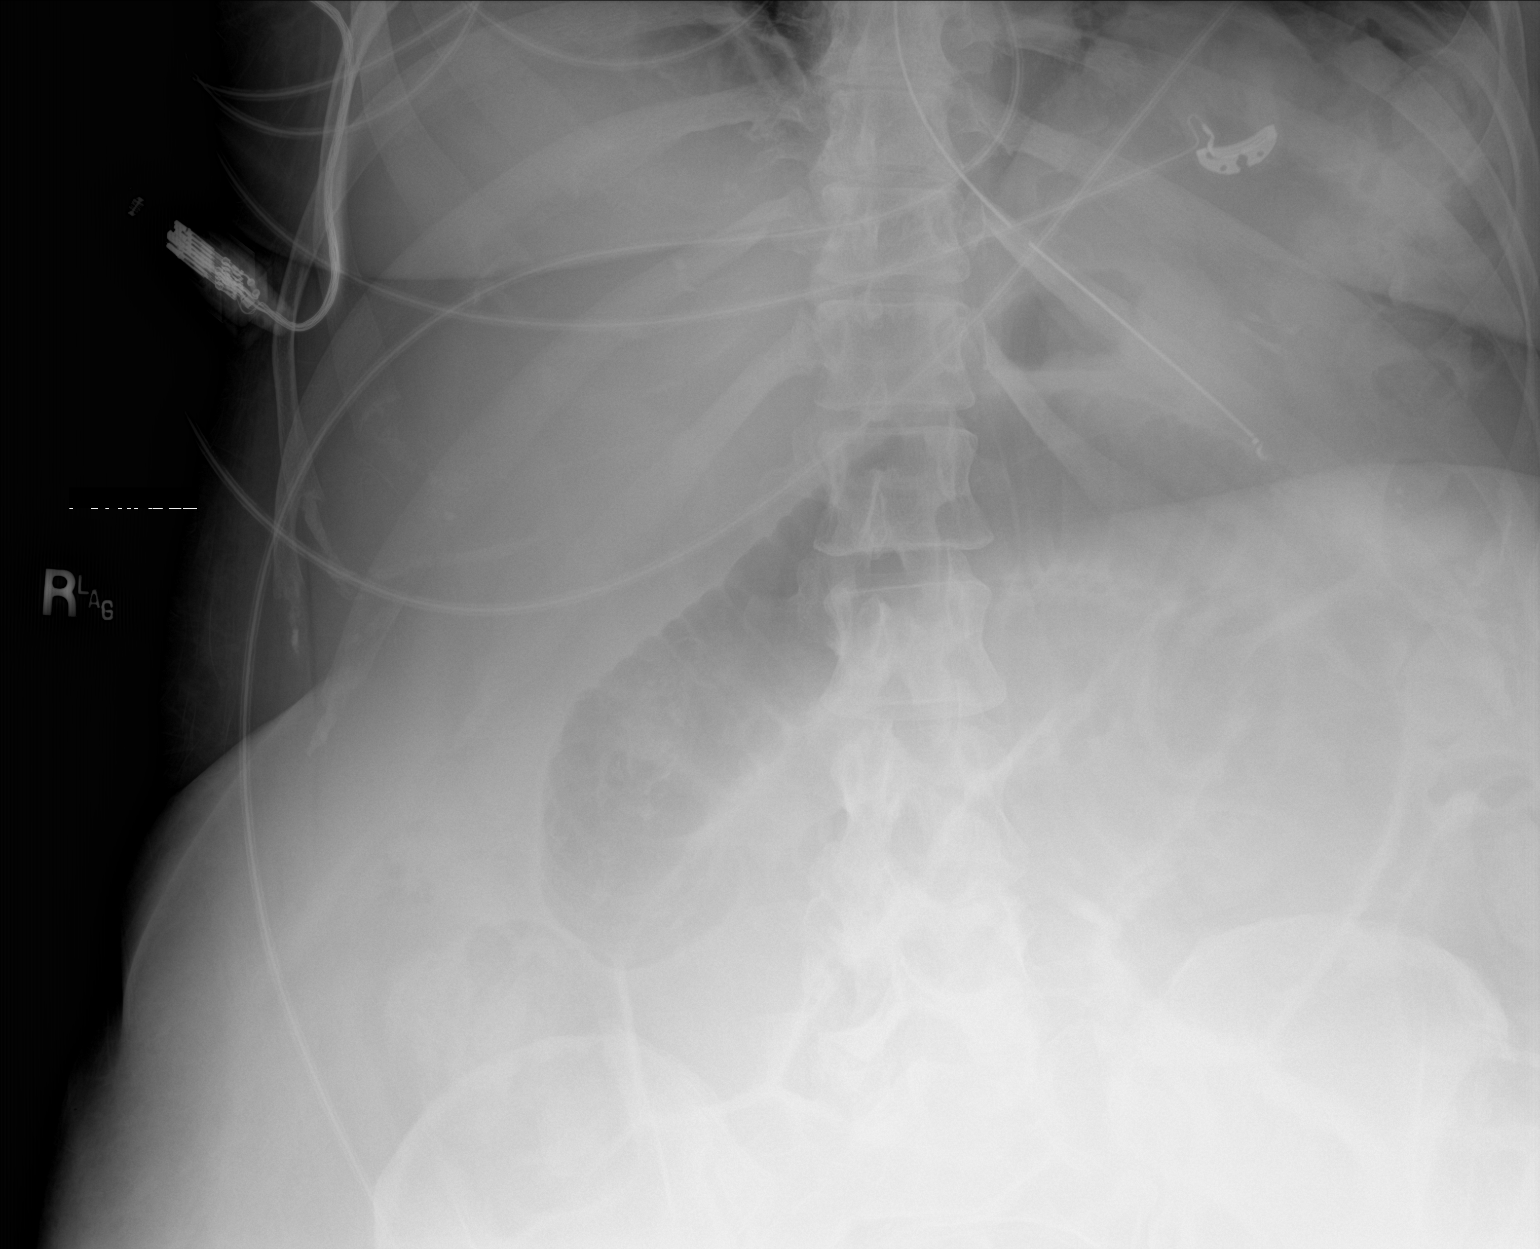

[abdomen kub (2 of 2)]
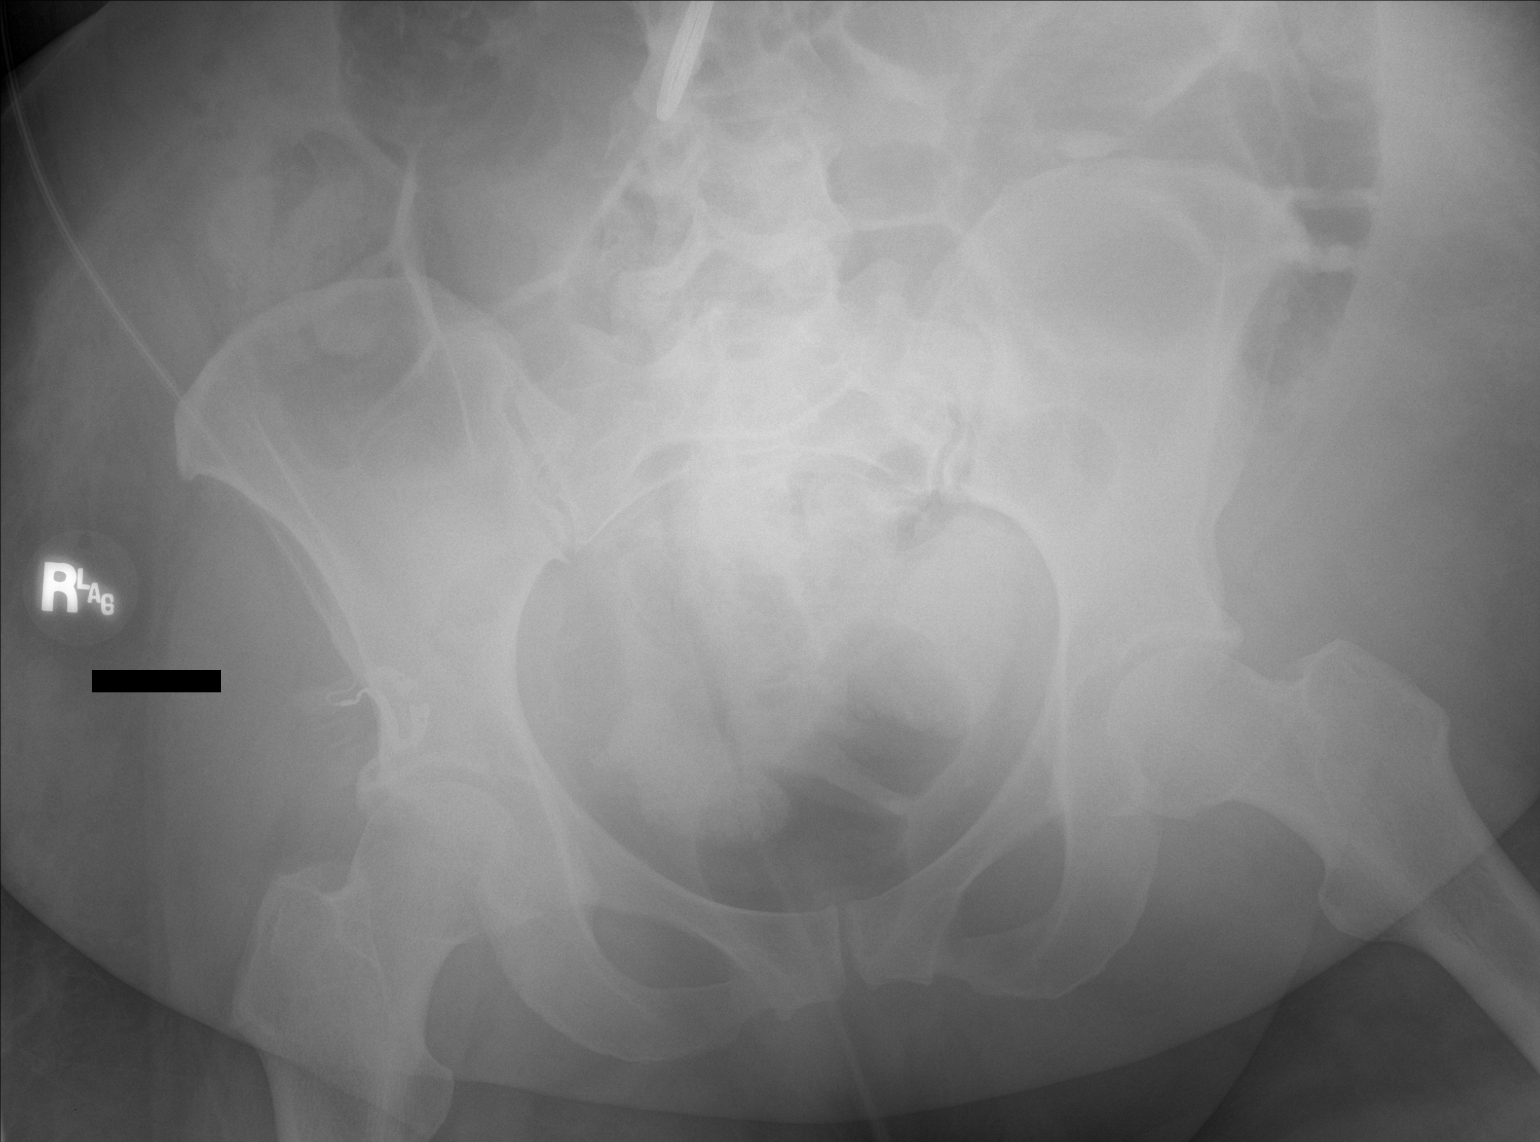

[2 of 2 positions shown; findings below may reference images not displayed]

FINDINGS: Transesophageal tube tip terminates in the left upper quadrant with
the side port near the GE junction. Recommend advancing 3-5 cm for
optimal positioning.

Telemetry leads and external devices overlie the upper abdomen and
lower chest.

Persistently air distended loops of clustered small bowel in the mid
to upper abdomen. Overall degree of distention is not significantly
changed from prior. No suspicious abdominal calcifications. No acute
osseous abnormality or suspicious osseous lesion. Remaining soft
tissues are unremarkable.
IMPRESSION: 1. Unchanged small bowel dilatation compatible with obstruction.
2. Transesophageal tube tip terminates in the left upper quadrant
with the side port near the GE junction. Recommend advancing 3-5 cm
for optimal positioning.

These results will be called to the ordering clinician or
representative by the Radiologist Assistant, and communication
documented in the PACS or [REDACTED].

## 2021-02-14 IMAGING — CT CT ABD-PELV W/O CM
2 of 4 series · 16 of 46 positions shown, 18 images · non-contrast
Comparison: CT AP [DATE]

CLINICAL DATA: Evaluate for bowel obstruction.

EXAM:
CT ABDOMEN AND PELVIS WITHOUT CONTRAST
TECHNIQUE: Multidetector CT imaging of the abdomen and pelvis was performed
following the standard protocol without IV contrast.

[Series 2: axial st · axial · 0.93mm/px · z∈[-739,-284]mm · 13 of 103 slices shown, 15 images]
[im 6/103  soft-tissue]
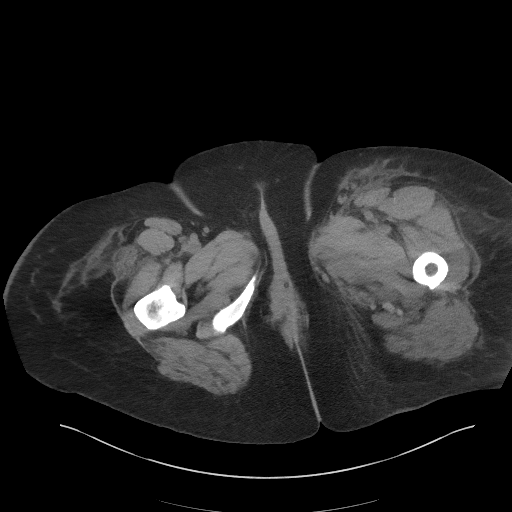
[im 6/103  bone]
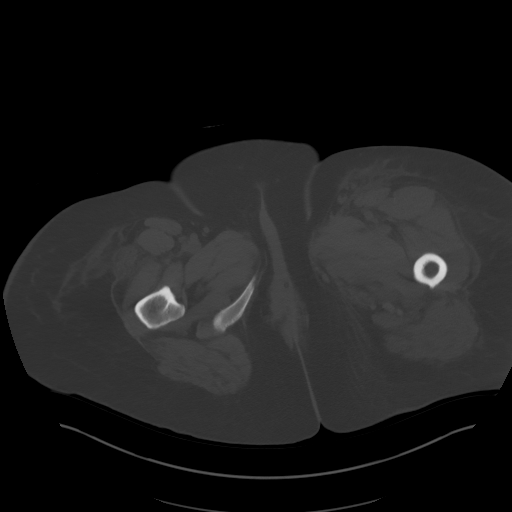
[im 16/103  soft-tissue]
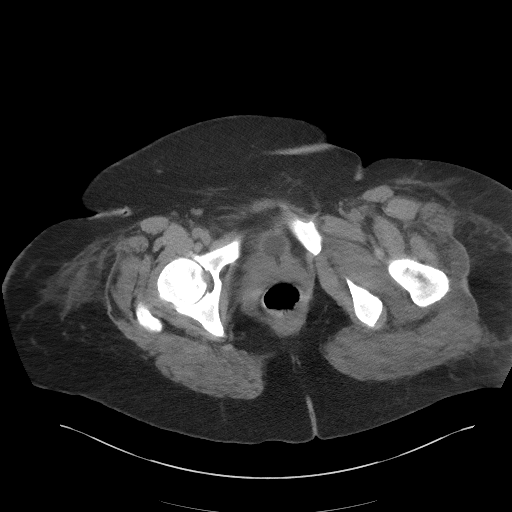
[im 21/103  soft-tissue]
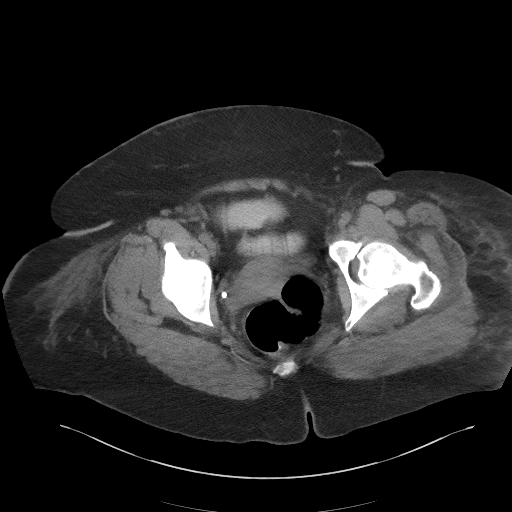
[im 31/103  soft-tissue]
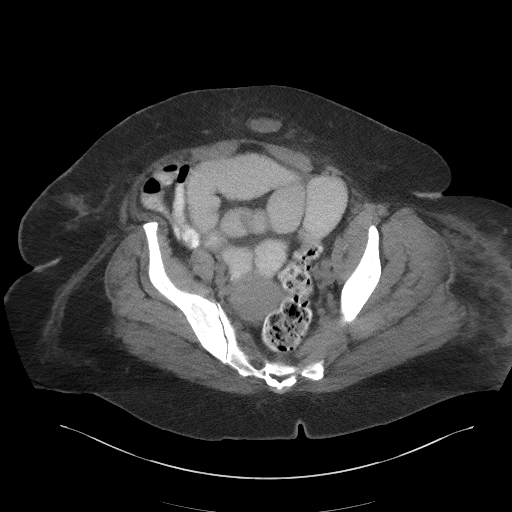
[im 36/103  soft-tissue]
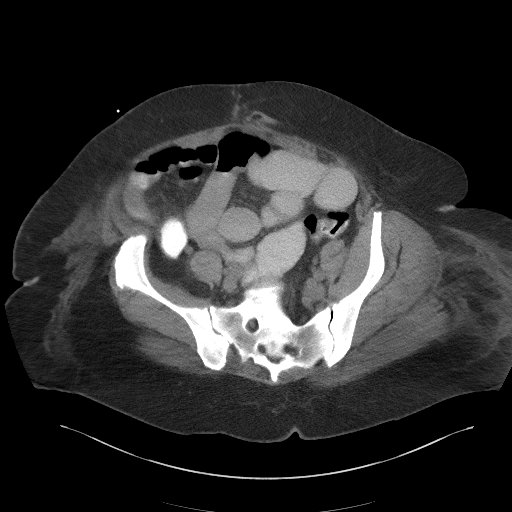
[im 46/103  soft-tissue]
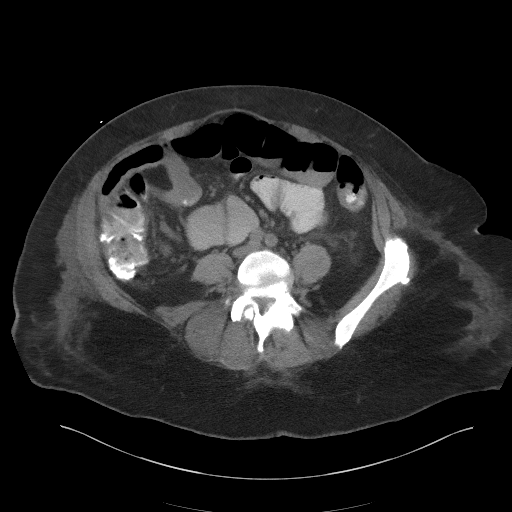
[im 52/103  soft-tissue]
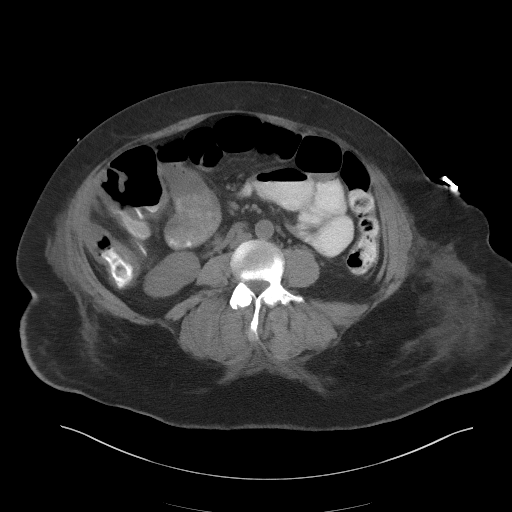
[im 57/103  soft-tissue]
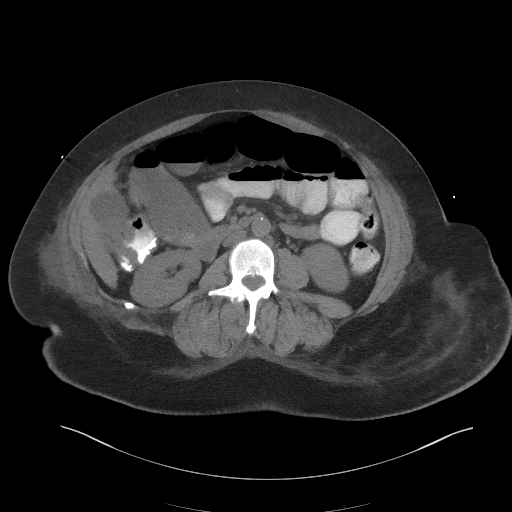
[im 67/103  soft-tissue]
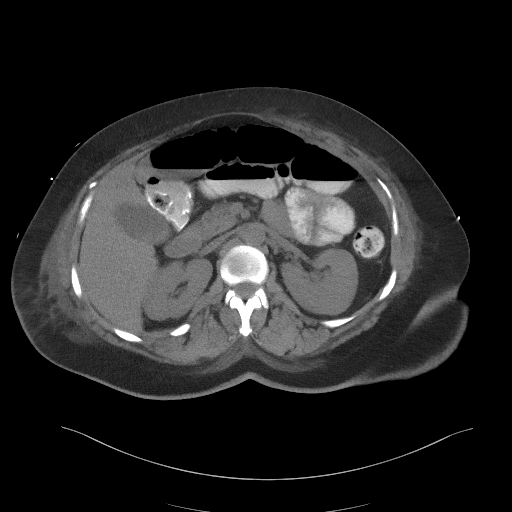
[im 67/103  bone]
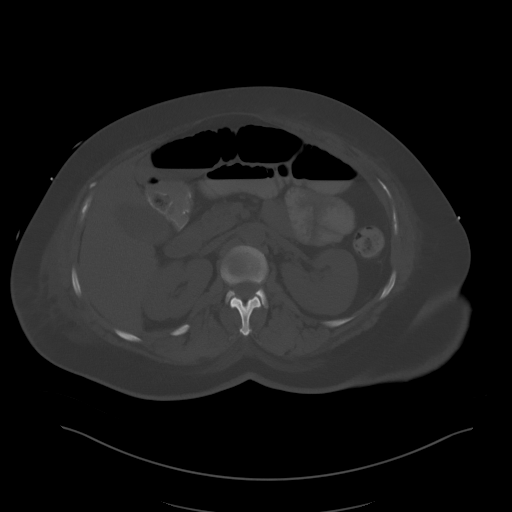
[im 72/103  soft-tissue]
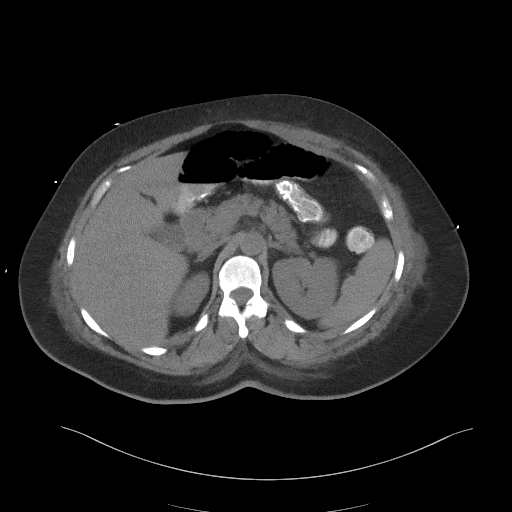
[im 82/103  soft-tissue]
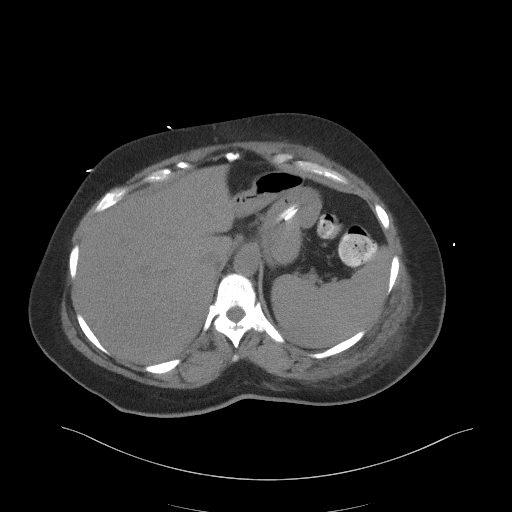
[im 87/103  soft-tissue]
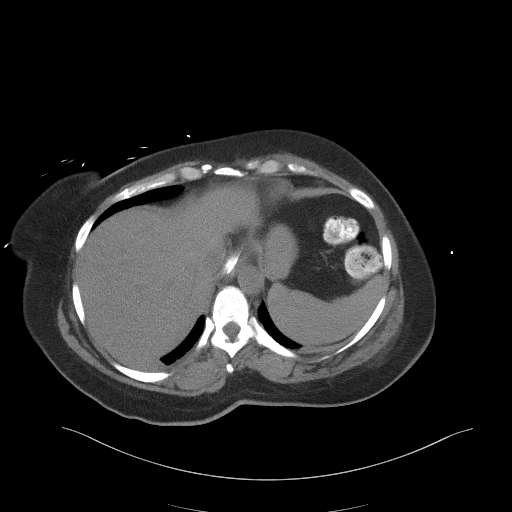
[im 97/103  soft-tissue]
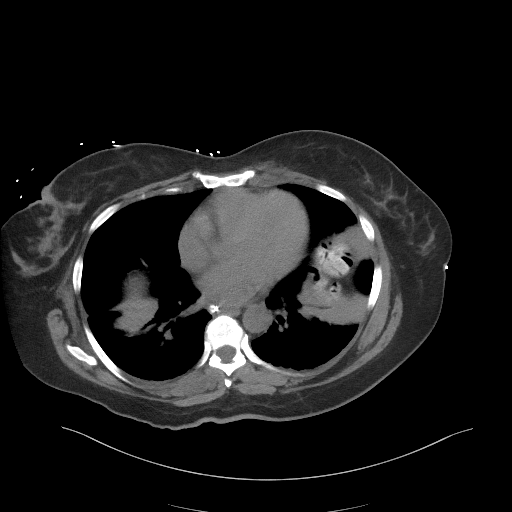

[Series 5: coronal st · coronal · 0.89mm/px · 3 of 110 slices shown]
[im 37/110  soft-tissue]
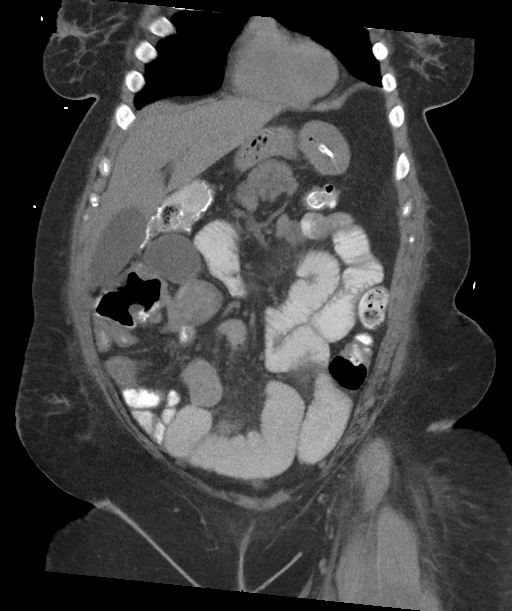
[im 49/110  soft-tissue]
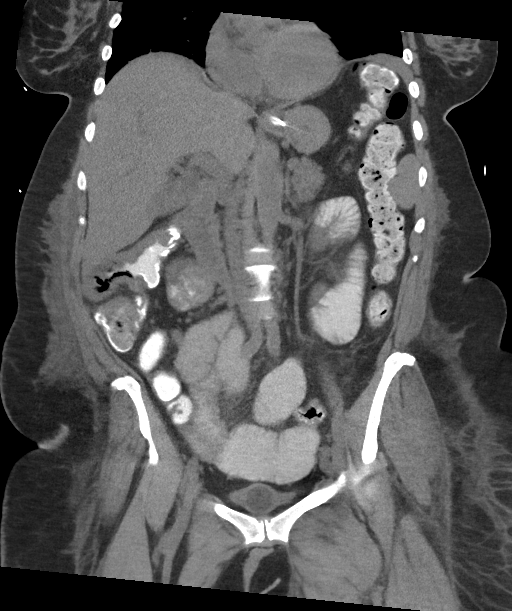
[im 61/110  soft-tissue]
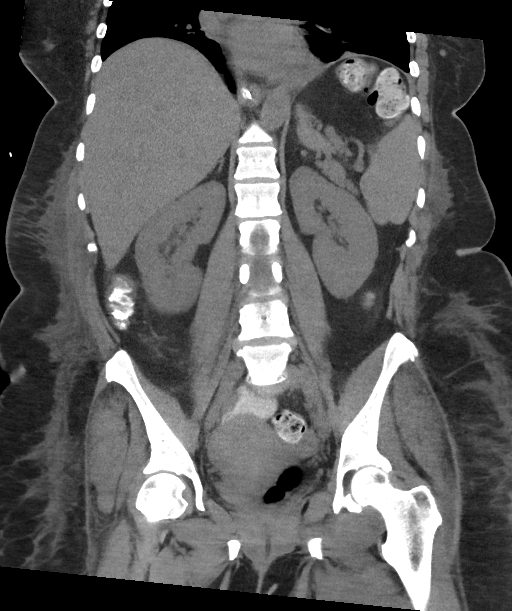

[16 of 46 positions shown; findings below may reference images not displayed]

FINDINGS: Lower chest: Persistent atelectasis within both lung bases. No
pleural effusion identified.

Hepatobiliary: There is no focal liver abnormality. Gallstones are
noted layering within the gallbladder. No signs of gallbladder wall
inflammation or bile duct dilatation.

Pancreas: Unremarkable. No pancreatic ductal dilatation or
surrounding inflammatory changes.

Spleen: Normal in size without focal abnormality.

Adrenals/Urinary Tract: Normal appearance of the adrenal glands. The
kidneys are unremarkable. No urinary tract calculi or
hydronephrosis. The bladder is collapsed around a Foley catheter.

Stomach/Bowel: There is an NG tube with tip in body of stomach. The
stomach appears decompressed. Dilated small bowel loops with
air-fluid levels are again identified. Small bowel loops measure up
to 4.2 cm in diameter. The appendix is visualized and appears
normal. Enteric contrast material is identified throughout the colon
up to the level of the rectum.

Vascular/Lymphatic: Mild aortic atherosclerosis. No aneurysm. No
abdominopelvic adenopathy.

Reproductive: Uterus and bilateral adnexa are unremarkable.

Other: No free fluid or fluid collections. No pneumoperitoneum
identified. Small ventral midline abdominal wall hernia just below
the level of the umbilicus is noted. This contains fat and a small
amount of fluid.

Musculoskeletal: Degenerative disc disease identified at L5-S1. No
acute or suspicious osseous findings.
IMPRESSION: 1. Persistent dilated small bowel loops with air-fluid levels
compatible with partial small bowel obstruction. The transition
point is in the right lower quadrant of the abdomen and may be
secondary to adhesive disease. Enteric contrast material from
previous CT has progressed to the colon up to the level of the
rectum. No signs of pneumatosis or pneumoperitoneum.
2. Gallstones.
3. Aortic atherosclerosis.

Aortic Atherosclerosis ([M3]-[M3]).

## 2021-02-14 MED ORDER — IOHEXOL 9 MG/ML PO SOLN
500.0000 mL | ORAL | Status: AC
  Administered 2021-02-14: 500 mL via ORAL

## 2021-02-14 MED ORDER — IOHEXOL 9 MG/ML PO SOLN
ORAL | Status: AC
  Administered 2021-02-14: 500 mL via ORAL
  Filled 2021-02-14: qty 1000

## 2021-02-14 MED ORDER — CHLORHEXIDINE GLUCONATE CLOTH 2 % EX PADS
6.0000 | MEDICATED_PAD | Freq: Every day | CUTANEOUS | Status: DC
  Administered 2021-02-15 – 2021-03-07 (×21): 6 via TOPICAL

## 2021-02-14 MED ORDER — MORPHINE SULFATE (PF) 4 MG/ML IV SOLN
INTRAVENOUS | Status: AC
  Administered 2021-02-14: 3 mg via INTRAVENOUS
  Filled 2021-02-14: qty 1

## 2021-02-14 MED ORDER — MORPHINE BOLUS VIA INFUSION: 3.0000 mg | Freq: Once | INTRAVENOUS | Status: DC

## 2021-02-14 MED ORDER — MORPHINE SULFATE (PF) 4 MG/ML IV SOLN: 3.0000 mg | Freq: Once | INTRAVENOUS | Status: AC

## 2021-02-14 MED ORDER — TECHNETIUM TC 99M MEBROFENIN IV KIT
5.5000 | PACK | Freq: Once | INTRAVENOUS | Status: AC
  Administered 2021-02-14: 5.5 via INTRAVENOUS

## 2021-02-14 NOTE — Progress Notes (Signed)
NAME:  Renee Wilkinson, MRN:  [id number], DOB:  03/25/1971, LOS: 5 ADMISSION DATE:  02/09/2021, CONSULTATION DATE:  3/17 REFERRING MD:  EDP, CHIEF COMPLAINT:  Abdominal pain   Brief History:  50 year old admits to cocaine use who was found down beside that husband admitted with hyperkalemia, renal failure, shock liver with chief complaint of abdominal pain with imaging consistent with renal infarcts. Admitted to ICU, started CRRT. Has developed small bowel obstruction since admission, has significant myositis of   Pertinent  Medical History  Psychosis with behavioral health admit in 2021  Significant Hospital Events: Including procedures, antibiotic start and stop dates in addition to other pertinent events   . Admitted to ICU 3/17, VDRF, started CRRT . 3/17 R IJ HD cath, exchanged over wire on 3/19>  . 3/18 more awake, mottling of feet, numbness left leg> concern for compartment syndrome, consulted orthopedics, no compartment syndrome, started on neosynephrine overnight, psychiatry consult . 3/18 RUQ ultrasound > positive murphy's but no GB wall thickening; there are stones and sludge present, upper limits of normal CBD . 3/19 coox 55, awake, started on levophed . 3/19 TTE> LVEF 60-65%, RV OK . 3/20 off vasopressors, still has left leg pain/weakness . 3/20 General surgery consult for small bowel obstruction through ventral hernia . 3/20 CT abdomen/pelvis> Ventral hernia, small bowel obstruction . 3/20 CT thoracic/lumbar spine> advanced bilateral facet arthropathy with gaping joints which would worsen with statnding or flexion, could have spinal compression at this level . 3/20 HD catheter pulled out, held CRRT . 3/20 Neuro consult for left leg weakness and numbness> rec MRI lumbar spine . 3/21 remains on CRRT, gen surgery evaluating for perc chole  . 3/21 MRI thoracic and lumbar spine > extensive posterior paraspinous muscles consistent with myositis, no abscess; R posterior muscle edema  thorughout lumbar spine extensing into thoracic and cervical spine consistent with myositis, disc and facet degeneration at L4-5 . 3/22 bowel movement . 3/22 CT ab/pelvis with oral contrast >>>  Interim History / Subjective:   Had a bowel movement overnight Still has RUQ pain Left leg moving a little better Remains on CRRT    Objective   Blood pressure 120/64, pulse 97, temperature 98.6 F (37 C), temperature source Oral, resp. rate (!) 21, height '5\' 5"'$  (1.651 m), weight 107.3 kg, SpO2 97 %.        Intake/Output Summary (Last 24 hours) at 02/14/2021 0815 Last data filed at 02/14/2021 0800 Gross per 24 hour  Intake 2197.21 ml  Output 4003 ml  Net -1805.79 ml   Filed Weights   02/13/21 0500 02/14/21 0055 02/14/21 0433  Weight: 108 kg 107.3 kg 107.3 kg    Examination:  General:  Resting comfortably in bed HENT: NCAT OP clear PULM: CTA B, normal effort CV: RRR, no mgr GI: BS+, soft, tender RUQ, no guarding or rebound MSK: normal bulk and tone Neuro: awake, alert, no distress, MAEW   Labs/imaging personally reviewed   3/17 SARS COV 2/Flu > neg 3/17 blood cx >   Resolved Hospital Problem list   Acute respiratory failure with hypoxemia due to inability to protect airway Mild hypotension/crrt related? Acute metabolic encephalopathy due to drug overdose> resolved Need for sedation for mechanical ventilation > resolved  Assessment & Plan:    Suicidal ideation> contracts for safety, no plan, has been seen by psyche who feels she doesn't need inpatient care Acute grief> husband died Schizophrenia Depression Continue olanzapine, zoloft, trazodone  Prn xanax Minimize sedation F/u with psyche post  discharge  Fever, rising WBC> related to SBO? GB sludge? Clinical concern for acute cholecystitis but imaging doesn't support Continue zosyn Plan repeat CT imaging today  Left leg numbness/weakness L4-5 spinal stenosis and disc bulging Severe myositis of paraspinal  musculature F/u neurology recommendations PT consult  AKI, rhabdomyolysis due to myositis from paraspinal and leg musculature Renal infarct Monitor BMET and UOP Replace electrolytes as needed Switch from CRRT to Intermittent hemodialysis after we sort out what's going on with her gallbladder  Shock liver> improving LFT on 3/21 APAP negative on admission SBO F/u CT abdomen Consider perc chole drain LFT daily at this point NG to [facility]  Demand ischemia after being found down post narcotic overdose Echo OK, she does not have cardiogenic shock Shock has resolved Monitor hemodynamics tele  Best practice (evaluated daily)  Diet:  NPO Pain/Anxiety/Delirium protocol (if indicated): No VAP protocol (if indicated): Not indicated DVT prophylaxis: Subcutaneous Heparin GI prophylaxis: N/A Glucose control:  SSI No Central venous access:  Yes, and it is still needed Arterial line:  N/A Foley:  Yes, and it is still needed Mobility:  OOB  PT consulted: Yes Last date of multidisciplinary goals of care discussion [3/17] I called her daughter on 3/22, no answer Code Status:  full code Disposition: remain in ICU  Labs   CBC: Recent Labs  Lab 02/09/21 0830 02/10/21 0506 02/11/21 0402 02/12/21 0500 02/13/21 0559 02/14/21 0413  WBC 30.6* 21.8* 16.2* 18.8* 22.6* 19.2*  NEUTROABS 21.0*  --  11.7*  --  12.9*  --   HGB 15.9* 12.6 10.5* 9.2* 7.9* 7.1*  HCT 49.5* 38.1 31.9* 28.4* 24.3* 22.4*  MCV 91.2 87.2 88.1 89.9 90.0 91.8  PLT 370 201 151 133* 145* [id number]    Basic Metabolic Panel: Recent Labs  Lab 02/10/21 0506 02/10/21 1144 02/11/21 0402 02/11/21 1138 02/12/21 0500 02/12/21 1320 02/12/21 1700 02/12/21 2058 02/13/21 0559 02/13/21 1600 02/14/21 0413  NA 129*  130*   < > 129*  129*   < > 130*  130* 129*  131* 131* 131* 132*  133* 135 134*  K 5.2*  5.2*   < > 5.2*  5.2*   < > 4.8  4.8 4.7  4.7 4.9 4.8 4.6  4.6 4.6 4.6  CL 91*  91*   < > 94*  94*   < > 97*  97* 96*   97* 97* 98 99  99 100 102  CO2 25  26   < > 27  26   < > '25  24 25  25 24 24 24  24 25 22  '$ GLUCOSE 166*  166*   < > 153*  154*   < > 130*  129* 127*  129* 141* 137* 116*  116* 103* 102*  BUN 36*  34*   < > 23*  23*   < > '20  20 20  20 '$ 23* 25* 34*  35* 33* 29*  CREATININE 2.54*  2.58*   < > 2.24*  2.27*   < > 2.46*  2.46* 2.72*  2.56* 3.02* 3.44* 4.34*  4.22* 3.91* 3.26*  CALCIUM 6.7*  6.8*   < > 7.4*  7.4*   < > 7.6*  7.6* 7.7*  7.8* 7.6* 7.5* 7.5*  7.5* 7.5* 7.5*  MG 2.3  --  2.4  --  2.4  --   --   --  2.7*  --  2.5*  PHOS 5.8*   < > 3.4   < > 2.7  2.7 3.1  --  4.4 3.6 3.2   < > = values in this interval not displayed.   GFR: Estimated Creatinine Clearance: 25.1 mL/min (A) (by C-G formula based on SCr of 3.26 mg/dL (H)). Recent Labs  Lab 02/09/21 0845 02/09/21 1108 02/10/21 0506 02/11/21 0402 02/12/21 0500 02/13/21 0559 02/14/21 0413  WBC  --   --    < > 16.2* 18.8* 22.6* 19.2*  LATICACIDVEN 4.4* 4.0*  --   --   --   --   --    < > = values in this interval not displayed.    Liver Function Tests: Recent Labs  Lab 02/10/21 0506 02/10/21 1555 02/11/21 0402 02/11/21 1548 02/12/21 0500 02/12/21 1320 02/12/21 1700 02/13/21 0559 02/13/21 1600 02/14/21 0413  AST 1,303*  --  808*  --  661*  --   --  528*  --  372*  ALT 524*  --  387*  --  307*  --   --  239*  --  187*  ALKPHOS 124  --  99  --  90  --   --  99  --  89  BILITOT 0.9  --  0.8  --  0.8  --   --  0.5  --  1.1  PROT 7.0  --  6.4*  --  6.3*  --   --  6.1*  --  5.8*  ALBUMIN 2.8*  2.9*   < > 2.4*  2.4*   < > 2.3*  2.4* 2.5* 2.6* 2.3*  2.3* 2.2* 2.2*  2.1*   < > = values in this interval not displayed.   No results for input(s): LIPASE, AMYLASE in the last 168 hours. No results for input(s): AMMONIA in the last 168 hours.  ABG    Component Value Date/Time   HCO3 14.7 (L) 02/09/2021 1105   ACIDBASEDEF 12.7 (H) 02/09/2021 1105   O2SAT 55.9 02/11/2021 0938     Coagulation  Profile: Recent Labs  Lab 02/12/21 1700  INR 1.0    Cardiac Enzymes: Recent Labs  Lab 02/09/21 0830  CKTOTAL >50,000*    HbA1C: No results found for: HGBA1C  CBG: Recent Labs  Lab 02/09/21 1522  GLUCAP 158*     Critical care time: 40 minutes     [name], MD [place] Pager: [phone] Cell: [phone] If no response, please call [phone] until 7pm After 7:00 pm call Elink  [phone]

## 2021-02-14 NOTE — Progress Notes (Signed)
PT Cancellation Note  Patient Details Name: Renee Wilkinson MRN: [id number] DOB: 06-14-1971   Cancelled Treatment:    Reason Eval/Treat Not Completed: Patient at procedure or test/unavailable.     [facility] 02/14/2021, 3:34 PM

## 2021-02-14 NOTE — Progress Notes (Signed)
Patient ID: Renee Wilkinson, female   DOB: 09-24-1971, 50 y.o.   MRN: [id number] Latest CT scan of abdomen pelvis performed today was reviewed by Dr. Dwaine Gale.  Gallstones are noted layering within the gallbladder.  There are no signs of gallbladder wall inflammation or bile duct dilatation.  There is some wall thickening of the right upper portion of the colon.  Additional finding includes partial small bowel obstruction with transition point in the right lower quadrant. Above discussed directly with Dr. Donne Hazel by Dr. Dwaine Gale. Will plan to hold on perc cholecystostomy at this time.

## 2021-02-14 NOTE — Progress Notes (Signed)
Subjective: C/o RUQ and right sided abdominal pain.  Has a small BM overnight, but still with high NGT output.   ROS: See above, otherwise other systems negative  Objective: Vital signs in last 24 hours: Temp:  [97.9 F (36.6 C)-98.7 F (37.1 C)] 98.6 F (37 C) (03/22 0433) Pulse Rate:  [95-109] 97 (03/22 0700) Resp:  [16-23] 21 (03/22 0700) BP: (95-158)/(40-78) 120/64 (03/22 0700) SpO2:  [90 %-98 %] 97 % (03/22 0700) Weight:  [107.3 kg] 107.3 kg (03/22 0433) Last BM Date:  (pta)  Intake/Output from previous day: 03/21 0701 - 03/22 0700 In: 2092.2 [I.V.:1881.6; NG/GT:30; IV Piggyback:180.7] Out: 3852 [Urine:43; Emesis/NG output:1250] Intake/Output this shift: No intake/output data recorded.  PE: Gen: anxious, but NAD Heart: regular, but tachy, multiple ectopic beats Lungs: CTAB Abd: soft, infraumbilical hernia is not palpable this morning and feels reduced but she is very tender in this area today when I try and press.  NGT in place with bilious output, very tender in RUQ with voluntary guarding and + Murphy's sign still.  Lab Results:  Recent Labs    02/13/21 0559 02/14/21 0413  WBC 22.6* 19.2*  HGB 7.9* 7.1*  HCT 24.3* 22.4*  PLT 145* 151   BMET Recent Labs    02/13/21 1600 02/14/21 0413  NA 135 134*  K 4.6 4.6  CL 100 102  CO2 25 22  GLUCOSE 103* 102*  BUN 33* 29*  CREATININE 3.91* 3.26*  CALCIUM 7.5* 7.5*   PT/INR Recent Labs    02/12/21 1700  LABPROT 12.7  INR 1.0   CMP     Component Value Date/Time   NA 134 (L) 02/14/2021 0413   K 4.6 02/14/2021 0413   CL 102 02/14/2021 0413   CO2 22 02/14/2021 0413   GLUCOSE 102 (H) 02/14/2021 0413   BUN 29 (H) 02/14/2021 0413   CREATININE 3.26 (H) 02/14/2021 0413   CALCIUM 7.5 (L) 02/14/2021 0413   PROT 5.8 (L) 02/14/2021 0413   ALBUMIN 2.1 (L) 02/14/2021 0413   ALBUMIN 2.2 (L) 02/14/2021 0413   AST 372 (H) 02/14/2021 0413   ALT 187 (H) 02/14/2021 0413   ALKPHOS 89 02/14/2021 0413    BILITOT 1.1 02/14/2021 0413   GFRNONAA 17 (L) 02/14/2021 0413   Lipase  No results found for: LIPASE     Studies/Results: CT ABDOMEN PELVIS WO CONTRAST  Result Date: 02/12/2021 CLINICAL DATA:  Low back pain, abdominal pain, acute renal failure. EXAM: CT ABDOMEN AND PELVIS WITHOUT CONTRAST TECHNIQUE: Multidetector CT imaging of the abdomen and pelvis was performed following the standard protocol without IV contrast. COMPARISON:  Abdominal ultrasound dated 02/10/2021 and CT abdomen pelvis dated 02/09/2021. FINDINGS: Lower chest: There is a moderate bibasilar atelectasis. Hepatobiliary: No focal liver abnormality is seen. No gallstones, gallbladder wall thickening, or biliary dilatation. Pancreas: Unremarkable. No pancreatic ductal dilatation or surrounding inflammatory changes. Spleen: Normal in size without focal abnormality. Adrenals/Urinary Tract: Adrenal glands are unremarkable. Kidneys appear normal on this non contrast exam, without renal calculi, focal lesion, or hydronephrosis. A Foley catheter is in place. Stomach/Bowel: Stomach is within normal limits. There is a high-grade small bowel obstruction with a transition point to decompressed distal small bowel within an infraumbilical ventral abdominal wall hernia containing a loop of small bowel. Multiple dilated loops of small bowel with air-fluid levels and fecalization of bowel contents are seen proximal to the obstruction. The appendix appears normal. No evidence of bowel inflammatory changes. Vascular/Lymphatic: Aortic atherosclerosis. No  enlarged abdominal or pelvic lymph nodes. Reproductive: Uterus and bilateral adnexa are unremarkable. Other: Trace free fluid is seen in the pelvis. Musculoskeletal: Degenerative changes are seen at L5-S1. IMPRESSION: 1. High-grade small bowel obstruction with a transition point within an infraumbilical ventral abdominal wall hernia. Aortic Atherosclerosis (ICD10-I70.0). These results were called by telephone  at the time of interpretation on 02/12/2021 at 2:39 pm to provider [employer] , who verbally acknowledged these results. Electronically Signed   By: Zerita Boers M.D.   On: 02/12/2021 14:40   DG Chest 1 View  Result Date: 02/14/2021 CLINICAL DATA:  50 year old female enteric tube advanced. EXAM: CHEST  1 VIEW COMPARISON:  0442 hours the same day and earlier. FINDINGS: Portable AP semi upright view at 0659 hours. Enteric tube advanced further into the stomach, side hole now the level of the gastric body. Continued gas-filled small bowel loops in the mid abdomen. Low lung volumes. Partially visible left neck approach central line. No acute osseous abnormality identified. IMPRESSION: 1. Enteric tube advanced with side hole now the level of the gastric body. 2. Continued gas pattern suspicious for small bowel obstruction or ileus. Electronically Signed   By: Genevie Ann M.D.   On: 02/14/2021 07:23   DG Abd 1 View  Result Date: 02/12/2021 CLINICAL DATA:  NG tube placement EXAM: ABDOMEN - 1 VIEW COMPARISON:  CT 02/12/2021 FINDINGS: NG tube tip is in the proximal stomach with the side port in the distal esophagus. Dilated small bowel loops compatible with small bowel obstruction. IMPRESSION: NG tube tip in the proximal stomach. Electronically Signed   By: Rolm Baptise M.D.   On: 02/12/2021 15:38   CT THORACIC SPINE WO CONTRAST  Result Date: 02/12/2021 CLINICAL DATA:  Possible IV drug use. Left leg weakness and back pain. EXAM: CT THORACIC SPINE WITHOUT CONTRAST TECHNIQUE: Multidetector CT images of the thoracic were obtained using the standard protocol without intravenous contrast. COMPARISON:  None. FINDINGS: Alignment: Increased kyphotic curvature. Vertebrae: No fracture or primary bone lesion. No finding to suggest bone infection or disc space infection. Paraspinal and other soft tissues: Negative other than atelectasis or scarring in the lower lobes. Disc levels: Chronic disc space narrowing from T3-4  through T9-10. Small endplate osteophytes. No apparent bony stenosis of the canal or foramina. Ordinary mild facet osteoarthritis. No finding to suggest regional infection. IMPRESSION: Increased kyphotic curvature. Chronic degenerative disc disease from T3-4 through T9-10. No apparent bony stenosis of the canal or foramina. No finding to suggest bone infection or disc space infection. Electronically Signed   By: Nelson Chimes M.D.   On: 02/12/2021 13:17   CT LUMBAR SPINE WO CONTRAST  Result Date: 02/12/2021 CLINICAL DATA:  Possible IV drug use. Back pain and left leg numbness. EXAM: CT LUMBAR SPINE WITHOUT CONTRAST TECHNIQUE: Multidetector CT imaging of the lumbar spine was performed without intravenous contrast administration. Multiplanar CT image reconstructions were also generated. COMPARISON:  None. FINDINGS: Segmentation: 5 lumbar type vertebral bodies. Alignment: 2 mm degenerative anterolisthesis L4-5. Vertebrae: No fracture or focal bone lesion. Chronic discogenic endplate sclerotic changes at L5-S1. No bone finding to suggest infection. Paraspinal and other soft tissues: Negative Disc levels: No significant finding from T12-L1 through L3-4. L4-5: Advanced bilateral facet arthropathy with gaping joints. 2 mm of anterolisthesis, which would likely worsen with standing or flexion. Bulging of the disc. Multifactorial stenosis that would likely worsen with standing or flexion. Neural compression could occur at this level. L5-S1: Chronic disc degeneration with loss of disc height.  Endplate osteophytes and bulging of the disc. Vacuum phenomenon. Chronic endplate sclerosis without evidence of erosion. Bilateral foraminal stenosis could affect either L5 nerve. 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 IMPRESSION: 1. L4-5: Advanced bilateral facet arthropathy with gaping joints. 2 mm of anterolisthesis, which would likely worsen with standing or flexion. Bulging of the disc. Multifactorial  stenosis that would likely worsen with standing or flexion. Neural compression could occur at this level. 2. L5-S1: Chronic disc degeneration with loss of disc height. Endplate osteophytes and bulging of the disc. Bilateral foraminal stenosis could affect either L5 nerve. 3. 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 4. No finding to suggest regional spinal infection. Electronically Signed   By: Nelson Chimes M.D.   On: 02/12/2021 13:20   MR CERVICAL SPINE WO CONTRAST  Result Date: 02/13/2021 CLINICAL DATA:  Myelopathy, acute or progressive. Suspect infection. Drug abuse. EXAM: MRI CERVICAL SPINE WITHOUT CONTRAST TECHNIQUE: Multiplanar, multisequence MR imaging of the cervical spine was performed. No intravenous contrast was administered. COMPARISON:  None. FINDINGS: Alignment: Mild retrolisthesis C5-6 otherwise normal alignment. Straightening of the cervical lordosis Vertebrae: Negative for fracture or mass. No evidence of spinal infection on unenhanced imaging. Cord: Normal signal and morphology. Posterior Fossa, vertebral arteries, paraspinal tissues: Edema in the right posterior paraspinous muscles extending from C4 through the upper thoracic spine. No focal fluid collection. Disc levels: C2-3: Mild left foraminal narrowing due to facet hypertrophy. C3-4: Mild facet degeneration bilaterally.  Negative for stenosis C4-5: Bilateral facet degeneration left greater than right. Disc degeneration and uncinate spurring. Mild foraminal narrowing bilaterally. C5-6: Mild retrolisthesis. Disc degeneration with diffuse uncinate spurring and bilateral facet degeneration. Moderate to severe foraminal encroachment bilaterally right greater than left. Mild spinal stenosis C6-7: Mild facet degeneration bilaterally. Mild right foraminal stenosis C7-T1: Mild right foraminal narrowing due to facet degeneration. IMPRESSION: Muscle edema in the right posterior paraspinous muscles without fluid collection. This  extends into the thoracic spine and likely is due to myositis. No abscess Degenerative changes in the thoracic spine. Multilevel spinal and foraminal stenosis due to spurring. Electronically Signed   By: Franchot Gallo M.D.   On: 02/13/2021 10:03   MR THORACIC SPINE WO CONTRAST  Result Date: 02/13/2021 CLINICAL DATA:  Myelopathy, acute or progressive. History of drug abuse. EXAM: MRI THORACIC SPINE WITHOUT CONTRAST TECHNIQUE: Multiplanar, multisequence MR imaging of the thoracic spine was performed. No intravenous contrast was administered. COMPARISON:  None. FINDINGS: Alignment:  Normal alignment.  Mild thoracic kyphosis. Vertebrae: Normal bone marrow. Negative for fracture, mass, or bone infection. No evidence of discitis. Cord: Cord evaluation limited by motion. There is significant CSF flow artifact in the spinal canal. Allowing for this, no suspicious cord lesion identified. Paraspinal and other soft tissues: Extensive edema in the right paraspinous muscles. This is only seen on the right side. Diffuse low-density edema is present without focal fluid collection. This extends into the cervical spine as described separately. Disc levels: Multilevel disc degeneration with disc space narrowing and mild spurring throughout much of the thoracic spine. No focal disc protrusion or significant spinal stenosis. IMPRESSION: Extensive muscle edema on the right involving the posterior paraspinous muscles. Findings most compatible with myositis. No abscess identified on unenhanced images. Motion degraded study. Electronically Signed   By: Franchot Gallo M.D.   On: 02/13/2021 10:21   MR LUMBAR SPINE WO CONTRAST  Result Date: 02/13/2021 CLINICAL DATA:  Acute myelopathy.  History of drug abuse. EXAM: MRI LUMBAR SPINE WITHOUT CONTRAST TECHNIQUE: Multiplanar, multisequence MR  imaging of the lumbar spine was performed. No intravenous contrast was administered. COMPARISON:  CT lumbar spine 02/12/2021 FINDINGS: Segmentation:   Normal Motion degraded study. Patient not able to tolerate axial images and terminated the study early. Alignment: Slight anterolisthesis L4-5 and mild retrolisthesis L5-S1 Vertebrae: No evidence of fracture or mass. No bone marrow edema. No evidence of bone infection or discitis. Conus medullaris and cauda equina: Conus extends to the approximately L1-2 level. Conus and cauda equina appear normal. Paraspinal and other soft tissues: Diffuse muscle edema in the right posterior paraspinous muscles. This extends into the cervical and thoracic spine as reported separately. No fluid collection identified. Disc levels: L1-2: Negative L2-3: Negative L3-4: Negative L4-5: Mild anterolisthesis. Disc bulging and bilateral facet degeneration. Moderate spinal stenosis as identified on CT. L5-S1: Disc degeneration with diffuse posterior endplate spurring. Moderate subarticular and foraminal stenosis bilaterally due to spurring. IMPRESSION: 1. Right posterior muscle edema throughout the lumbar spine extending into the thoracic and cervical spine most consistent with myositis. No abscess or fluid collection identified 2. 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 Electronically Signed   By: Franchot Gallo M.D.   On: 02/13/2021 10:25   DG Chest Port 1 View  Result Date: 02/13/2021 CLINICAL DATA:  Central line placement. EXAM: PORTABLE CHEST 1 VIEW COMPARISON:  02/12/2021 and CT chest 02/09/2021. FINDINGS: Removal of right IJ catheter with placement of a left IJ catheter. Tip is in the low SVC. Nasogastric tube terminates in the stomach with the side port in the region of the gastroesophageal junction. Heart size is accentuated by AP semi upright technique and low lung volumes. Minimal streaky atelectasis in the left lung base. No definite pleural fluid. No pneumothorax. IMPRESSION: 1. Interval left IJ central line placement without complicating feature. 2. Nasogastric tube could be  advanced several cm to better position the side port below the gastroesophageal junction, as clinically indicated. 3. Low lung volumes with minimal left basilar subsegmental atelectasis Electronically Signed   By: Lorin Picket M.D.   On: 02/13/2021 12:50   DG CHEST PORT 1 VIEW  Result Date: 02/12/2021 CLINICAL DATA:  Central line complication. Concern for location of central line. EXAM: PORTABLE CHEST 1 VIEW COMPARISON:  Prior chest radiograph 02/11/2021. FINDINGS: Right IJ approach central venous catheter with tip projecting in the region of the mid to upper SVC. Shallow inspiration radiograph. Heart size within normal limits. As before, there is subsegmental atelectasis within both lung bases. No appreciable pleural effusion or evidence of pneumothorax. No acute bony abnormality identified. IMPRESSION: Right IJ approach central venous catheter with tip projecting in the region of the mid to upper SVC. Shallow inspiration radiograph. Redemonstrated bibasilar subsegmental atelectasis. Electronically Signed   By: Kellie Simmering DO   On: 02/12/2021 13:40   DG Abd Portable 1V  Result Date: 02/14/2021 CLINICAL DATA:  Small-bowel obstruction. EXAM: PORTABLE ABDOMEN - 1 VIEW COMPARISON:  Radiograph 02/12/2021 FINDINGS: Transesophageal tube tip terminates in the left upper quadrant with the side port near the GE junction. Recommend advancing 3-5 cm for optimal positioning. Telemetry leads and external devices overlie the upper abdomen and lower chest. Persistently air distended loops of clustered small bowel in the mid to upper abdomen. Overall degree of distention is not significantly changed from prior. No suspicious abdominal calcifications. No acute osseous abnormality or suspicious osseous lesion. Remaining soft tissues are unremarkable. IMPRESSION: 1. Unchanged small bowel dilatation compatible with obstruction. 2. Transesophageal tube tip terminates in the left  upper quadrant with the side port near the GE  junction. Recommend advancing 3-5 cm for optimal positioning. These results will be called to the ordering clinician or representative by the Radiologist Assistant, and communication documented in the PACS or [employer]. Electronically Signed   By: Lovena Le M.D.   On: 02/14/2021 05:27   US ABDOMEN LIMITED RUQ (LIVER/GB)  Result Date: 02/13/2021 CLINICAL DATA:  Acute cholecystitis. EXAM: ULTRASOUND ABDOMEN LIMITED RIGHT UPPER QUADRANT COMPARISON:  CT abdomen pelvis 02/12/2021. FINDINGS: Gallbladder: Numerous shadowing echogenic stones in the gallbladder, measuring up to 1.2 cm. Sludge is seen as well. No wall thickening. Negative sonographic Murphy sign. Common bile duct: Diameter: Suboptimally visualized due to body habitus, measures approximately 3 mm. Liver: Suboptimally visualized due to body habitus. Diffusely increased in echogenicity. No definite focal lesion. Portal vein is patent on color Doppler imaging with normal direction of blood flow towards the liver. Other: None. IMPRESSION: 1. Cholelithiasis and sludge.  No evidence of acute cholecystitis. 2. Hepatic steatosis. Electronically Signed   By: Lorin Picket M.D.   On: 02/13/2021 14:16    Anti-infectives: Anti-infectives (From admission, onward)   Start     Dose/Rate Route Frequency Ordered Stop   02/13/21 1400  piperacillin-tazobactam (ZOSYN) IVPB 3.375 g        3.375 g 12.5 mL/hr over 240 Minutes Intravenous Every 8 hours 02/13/21 0826     02/13/21 0815  piperacillin-tazobactam (ZOSYN) IVPB 3.375 g        3.375 g 100 mL/hr over 30 Minutes Intravenous  Once 02/13/21 0733 02/13/21 0820   02/09/21 1015  ceFEPIme (MAXIPIME) 2 g in sodium chloride 0.9 % 100 mL IVPB        2 g 200 mL/hr over 30 Minutes Intravenous  Once 02/09/21 1010 02/09/21 1112   02/09/21 1015  vancomycin (VANCOCIN) IVPB 1000 mg/200 mL premix        1,000 mg 200 mL/hr over 60 Minutes Intravenous  Once 02/09/21 1010 02/09/21 1901        Assessment/Plan Substance abuse Rhabdomyolysis with hyperkalemia ARF - HD cath came out.  CRRT/HD on hold.  Cr up to 4.22 today.  Per CCM Leukocytosis - 22K RLE weakness. Secondary to lumbar spine stenosis.    Umbilical hernia - intermittent incarceration with pSBO -Cont NGT.  Plain film still appears like patient has an SBO; however, hernia not palpable today c/w reduction.  Will repeat a CT scan to further figure out if now she just has an ileus vs a persistent problem that we can't feel due to body habitus. Gallstones/Elevated transaminases/RUQ abdominal pain -WBC 19K today -LFTs are still up but downtrending.  May be secondary to some shock liver, but doesn't possibly preclude cholecystitis secondary to cholestasis. -unable to do HIDA as she can't go 6 hrs without pain medication -she is still VERY tender in her RUQ c/w cholecystitis.  Will await CT scan today to further evaluate her whole abdomen and reengage with IR to further at that time.  FEN - NPO/NGT/IVFs VTE - heparin ID - [email or web address]   LOS: 5 days    Henreitta Cea , [facility] Surgery 02/14/2021, 7:47 AM Please see Amion for pager number during day hours 7:00am-4:30pm or 7:00am -11:30am on weekends

## 2021-02-14 NOTE — Progress Notes (Addendum)
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 No cord compression or radiculopathy seen on the scans. The extensive edema/myopathy best explains her weakness. Can consider obtaining an MRI of her thigh musculature to determine if muscle edema is present in her leg extensors as well. Neurohospitalist service will sign off. Please call if there are additional questions.   Electronically signed: Dr. Kerney Elbe

## 2021-02-14 NOTE — Progress Notes (Signed)
[facility] Progress Note  Subjective: pt not in room, off getting scans/ xrays. No UOP yesterday. I/O about =.   Vitals:   02/14/21 0900 02/14/21 1000 02/14/21 1100 02/14/21 1200  BP: (!) 119/52 110/65 (!) 112/36 106/60  Pulse: 93 95 93 92  Resp: '20 18 19 20  '$ Temp:    98.2 F (36.8 C)  TempSrc:    Oral  SpO2: 99% 100% 96% 96%  Weight:      Height:        Exam:  alert, nad   no jvd  Chest cta bilat  Cor reg no RG  Abd soft ntnd no ascites   GU foley w/ minimal amts brown urine   Ext 1-2+ LE edema bilat L> R   Alert, NF, ox3     Assessment/ Plan: 1. Renal- AKI with CK 50 K, CTA abdomen showed likely extensive bilateral renal infarcts. Suspected toxic ATN. No bacteremia or afib. CRRT started here 2/17. Getting 4K dialysate. Increasing IV hep to 750u/hr d/t clotting of filters. PTT is wnl. Cont CRRT.     2. Hypertension/volume: cont net negative UF 50-100 mL/ hr 3. Metabolic acidosis Improved w RRT 4. Rhabdomyolysis: was down for > 6 hrs, peak CPK > 50K. Will repeat in am.  5. Abd pain: RUQ US showing gallbladder sludge. Gen surgery evaluating. ^LFT's are likely muscle enzymes associated w/ her rhabdo incident. Coming down.  6. Substance abuse: her husband is deceased following the incident.  Spiritual care involved and psychiatry consulted  7. Shock: resolved, off levophed, trop peak > 4000, TTE with EF 60-65% and no WMA, RV OK 8. Dispo: remains in ICU   Kelly Splinter, MD 02/14/2021, 12:50 PM      Recent Labs  Lab 02/13/21 0559 02/13/21 1600 02/14/21 0413  K 4.6  4.6 4.6 4.6  BUN 34*  35* 33* 29*  CREATININE 4.34*  4.22* 3.91* 3.26*  CALCIUM 7.5*  7.5* 7.5* 7.5*  PHOS 4.4 3.6 3.2  HGB 7.9*  --  7.1*   Inpatient medications: . Chlorhexidine Gluconate Cloth  6 each Topical Q0600  . famotidine  20 mg Per Tube Daily  . heparin  5,000 Units Subcutaneous Q8H  . mouth rinse  15 mL Mouth Rinse BID  . OLANZapine  5 mg Per Tube QHS  . sertraline   50 mg Oral Daily  . sodium chloride flush  10-40 mL Intracatheter Q12H  . traZODone  150 mg Oral QHS   .  prismasol BGK 4/2.5 400 mL/hr at 02/13/21 1225  .  prismasol BGK 4/2.5 200 mL/hr at 02/13/21 1226  . sodium chloride 75 mL/hr at 02/14/21 1200  . dexmedetomidine (PRECEDEX) IV infusion Stopped (02/12/21 0959)  . heparin 10,000 units/ 20 mL infusion syringe 500 Units/hr (02/14/21 0631)  . norepinephrine (LEVOPHED) Adult infusion Stopped (02/11/21 1407)  . piperacillin-tazobactam (ZOSYN)  IV Stopped (02/14/21 0913)  . prismasol BGK 4/2.5 2,000 mL/hr at 02/14/21 0959   ALPRAZolam, opium-belladonna, docusate, heparin, HYDROmorphone (DILAUDID) injection, ondansetron (ZOFRAN) IV, oxyCODONE, sodium chloride, sodium chloride flush

## 2021-02-15 ENCOUNTER — Inpatient Hospital Stay (HOSPITAL_COMMUNITY): Payer: Medicaid - Out of State

## 2021-02-15 DIAGNOSIS — K56609 Unspecified intestinal obstruction, unspecified as to partial versus complete obstruction: Secondary | ICD-10-CM

## 2021-02-15 DIAGNOSIS — R1011 Right upper quadrant pain: Secondary | ICD-10-CM | POA: Diagnosis not present

## 2021-02-15 DIAGNOSIS — N179 Acute kidney failure, unspecified: Secondary | ICD-10-CM | POA: Diagnosis not present

## 2021-02-15 DIAGNOSIS — M6282 Rhabdomyolysis: Secondary | ICD-10-CM | POA: Diagnosis not present

## 2021-02-15 LAB — CBC WITH DIFFERENTIAL/PLATELET
Abs Immature Granulocytes: 1.82 10*3/uL — ABNORMAL HIGH (ref 0.00–0.07)
Basophils Absolute: 0.1 10*3/uL (ref 0.0–0.1)
Basophils Relative: 0 %
Eosinophils Absolute: 0.2 10*3/uL (ref 0.0–0.5)
Eosinophils Relative: 1 %
HCT: 20.1 % — ABNORMAL LOW (ref 36.0–46.0)
Hemoglobin: 6.3 g/dL — CL (ref 12.0–15.0)
Immature Granulocytes: 13 %
Lymphocytes Relative: 25 %
Lymphs Abs: 3.5 10*3/uL (ref 0.7–4.0)
MCH: 28.6 pg (ref 26.0–34.0)
MCHC: 31.3 g/dL (ref 30.0–36.0)
MCV: 91.4 fL (ref 80.0–100.0)
Monocytes Absolute: 1.4 10*3/uL — ABNORMAL HIGH (ref 0.1–1.0)
Monocytes Relative: 10 %
Neutro Abs: 6.9 10*3/uL (ref 1.7–7.7)
Neutrophils Relative %: 51 %
Platelets: 136 10*3/uL — ABNORMAL LOW (ref 150–400)
RBC: 2.2 MIL/uL — ABNORMAL LOW (ref 3.87–5.11)
RDW: 14.3 % (ref 11.5–15.5)
WBC: 13.9 10*3/uL — ABNORMAL HIGH (ref 4.0–10.5)
nRBC: 0.3 % — ABNORMAL HIGH (ref 0.0–0.2)

## 2021-02-15 LAB — HEPATIC FUNCTION PANEL
ALT: 139 U/L — ABNORMAL HIGH (ref 0–44)
AST: 228 U/L — ABNORMAL HIGH (ref 15–41)
Albumin: 1.9 g/dL — ABNORMAL LOW (ref 3.5–5.0)
Alkaline Phosphatase: 105 U/L (ref 38–126)
Bilirubin, Direct: 0.2 mg/dL (ref 0.0–0.2)
Indirect Bilirubin: 0.6 mg/dL (ref 0.3–0.9)
Total Bilirubin: 0.8 mg/dL (ref 0.3–1.2)
Total Protein: 5.2 g/dL — ABNORMAL LOW (ref 6.5–8.1)

## 2021-02-15 LAB — URINALYSIS, ROUTINE W REFLEX MICROSCOPIC
Bilirubin Urine: NEGATIVE
Glucose, UA: 50 mg/dL — AB
Ketones, ur: 5 mg/dL — AB
Nitrite: NEGATIVE
Protein, ur: 100 mg/dL — AB
Specific Gravity, Urine: 1.024 (ref 1.005–1.030)
WBC, UA: >50 WBC/hpf — ABNORMAL HIGH (ref 0–5)
pH: 5 (ref 5.0–8.0)

## 2021-02-15 LAB — CBC
HCT: 26.3 % — ABNORMAL LOW (ref 36.0–46.0)
Hemoglobin: 8.4 g/dL — ABNORMAL LOW (ref 12.0–15.0)
MCH: 29.2 pg (ref 26.0–34.0)
MCHC: 31.9 g/dL (ref 30.0–36.0)
MCV: 91.3 fL (ref 80.0–100.0)
Platelets: 163 10*3/uL (ref 150–400)
RBC: 2.88 MIL/uL — ABNORMAL LOW (ref 3.87–5.11)
RDW: 14.1 % (ref 11.5–15.5)
WBC: 15.1 10*3/uL — ABNORMAL HIGH (ref 4.0–10.5)
nRBC: 0.3 % — ABNORMAL HIGH (ref 0.0–0.2)

## 2021-02-15 LAB — RENAL FUNCTION PANEL
Albumin: 1.9 g/dL — ABNORMAL LOW (ref 3.5–5.0)
Anion gap: 9 (ref 5–15)
BUN: 27 mg/dL — ABNORMAL HIGH (ref 6–20)
CO2: 23 mmol/L (ref 22–32)
Calcium: 7.4 mg/dL — ABNORMAL LOW (ref 8.9–10.3)
Chloride: 103 mmol/L (ref 98–111)
Creatinine, Ser: 2.76 mg/dL — ABNORMAL HIGH (ref 0.44–1.00)
GFR, Estimated: 20 mL/min — ABNORMAL LOW (ref >60)
Glucose, Bld: 94 mg/dL (ref 70–99)
Phosphorus: 2.4 mg/dL — ABNORMAL LOW (ref 2.5–4.6)
Potassium: 4.4 mmol/L (ref 3.5–5.1)
Sodium: 135 mmol/L (ref 135–145)

## 2021-02-15 LAB — GLUCOSE, CAPILLARY: Glucose-Capillary: 83 mg/dL (ref 70–99)

## 2021-02-15 LAB — ABO/RH: ABO/RH(D): O POS

## 2021-02-15 LAB — MAGNESIUM: Magnesium: 2.5 mg/dL — ABNORMAL HIGH (ref 1.7–2.4)

## 2021-02-15 LAB — SODIUM, URINE, RANDOM: Sodium, Ur: 44 mmol/L

## 2021-02-15 LAB — APTT: aPTT: 39 seconds — ABNORMAL HIGH (ref 24–36)

## 2021-02-15 LAB — CREATININE, URINE, RANDOM: Creatinine, Urine: 137.51 mg/dL

## 2021-02-15 LAB — CK: Total CK: 3229 U/L — ABNORMAL HIGH (ref 38–234)

## 2021-02-15 LAB — PREPARE RBC (CROSSMATCH)

## 2021-02-15 IMAGING — US US RENAL
1 series · 14 of 25 positions shown · non-contrast
Comparison: None.

CLINICAL DATA: Acute renal insufficiency

EXAM:
RENAL / URINARY TRACT ULTRASOUND COMPLETE

[Series 1: us renal · 14 of 44 slices shown]
[im 1/44]
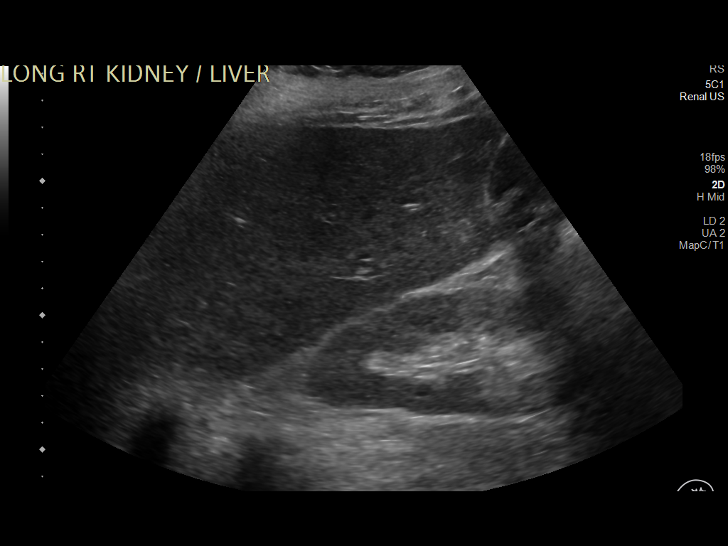
[im 4/44]
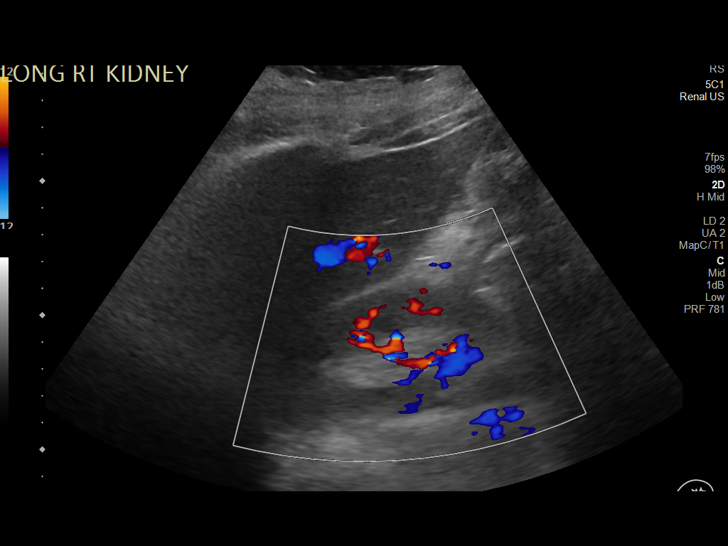
[im 8/44]
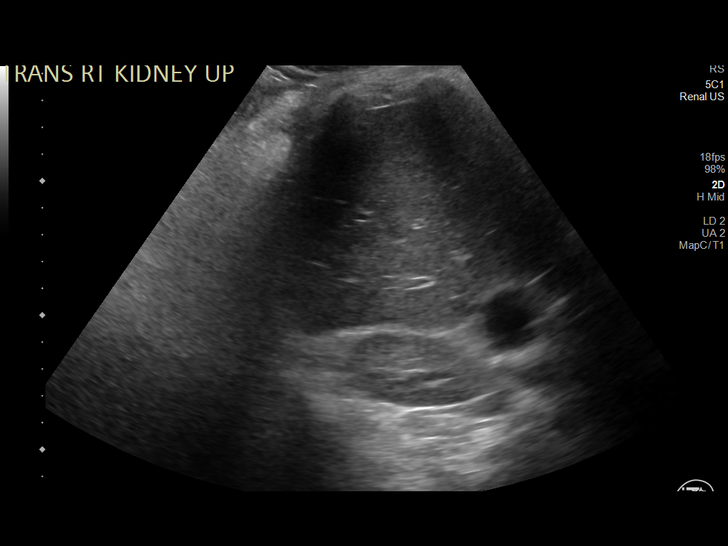
[im 11/44]
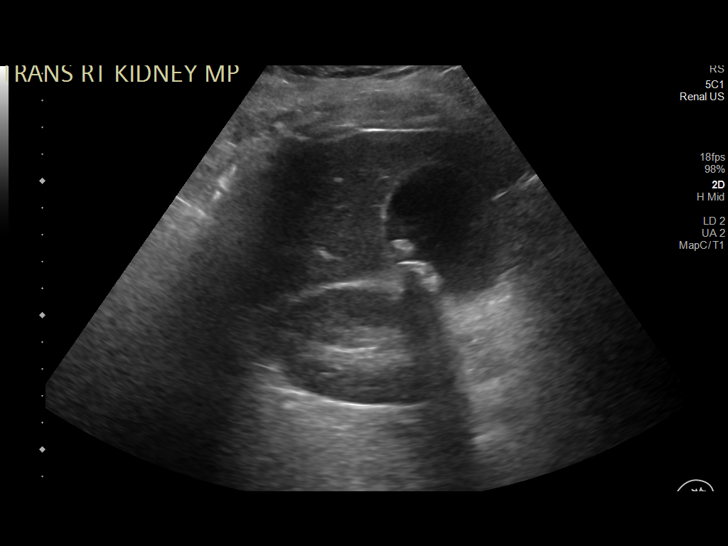
[im 15/44]
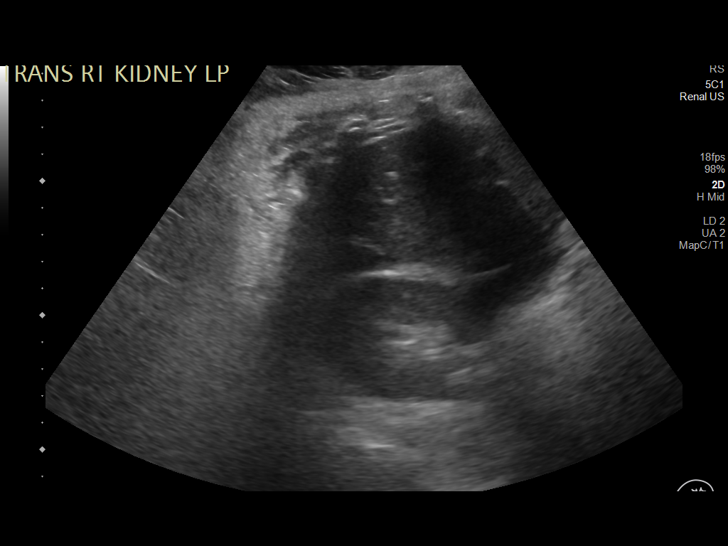
[im 17/44]
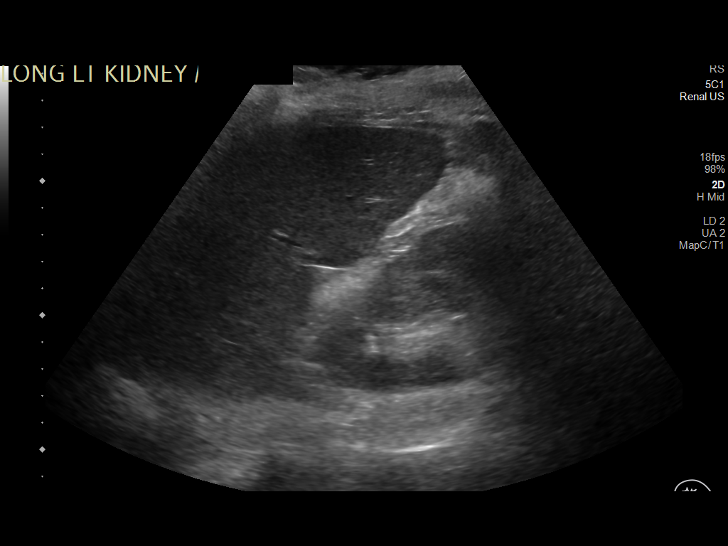
[im 20/44]
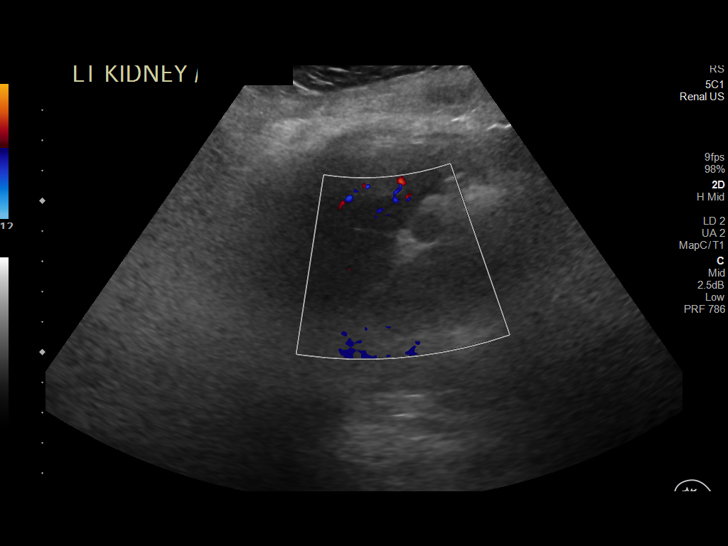
[im 24/44]
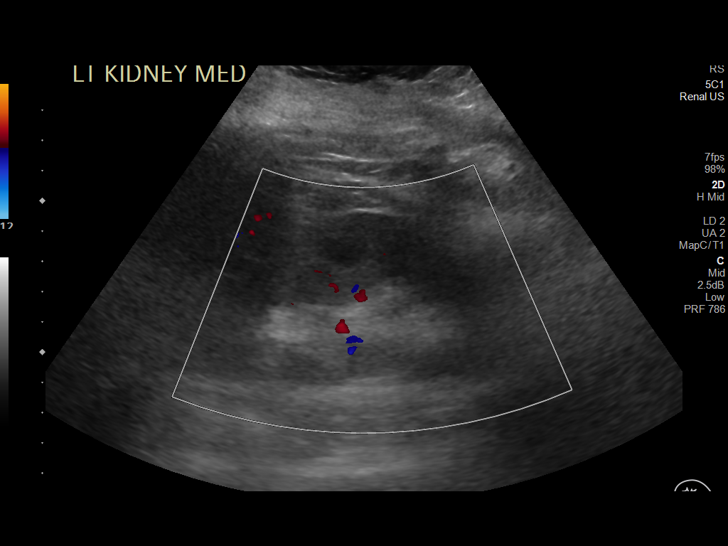
[im 27/44]
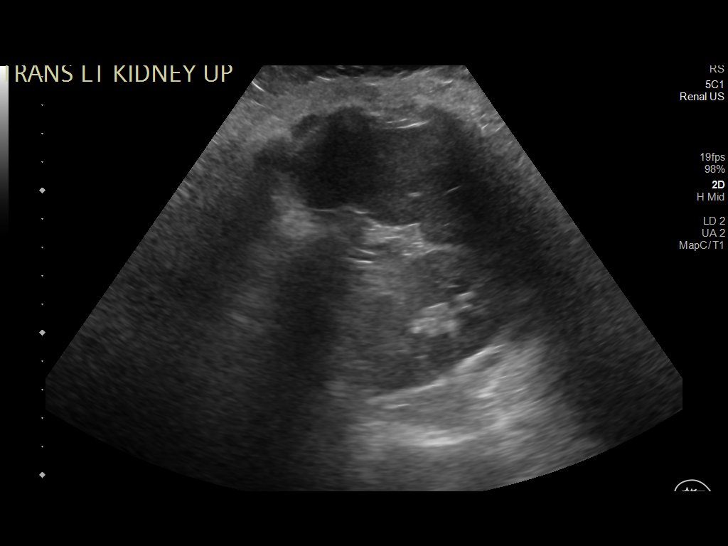
[im 29/44]
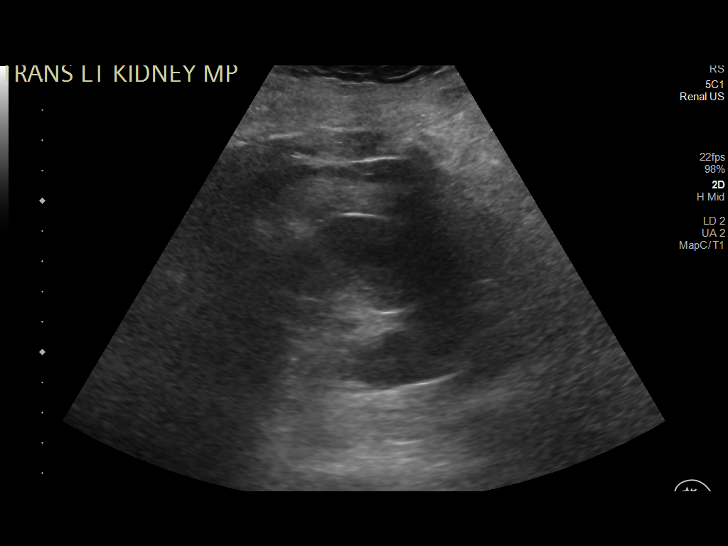
[im 33/44]
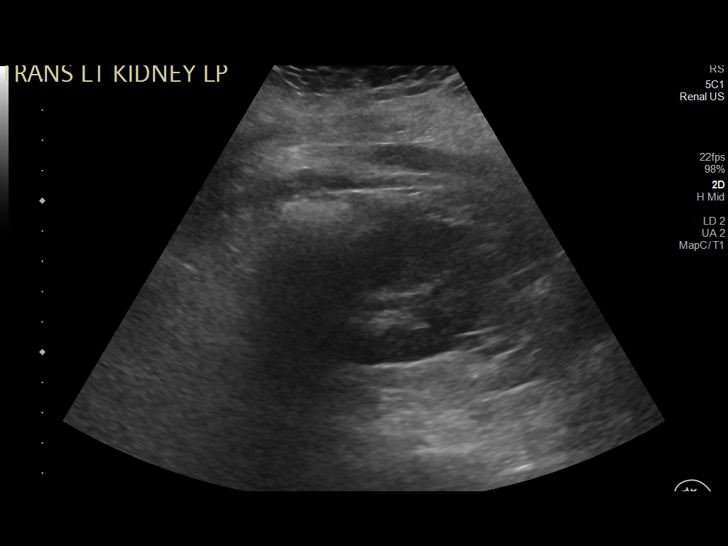
[im 36/44]
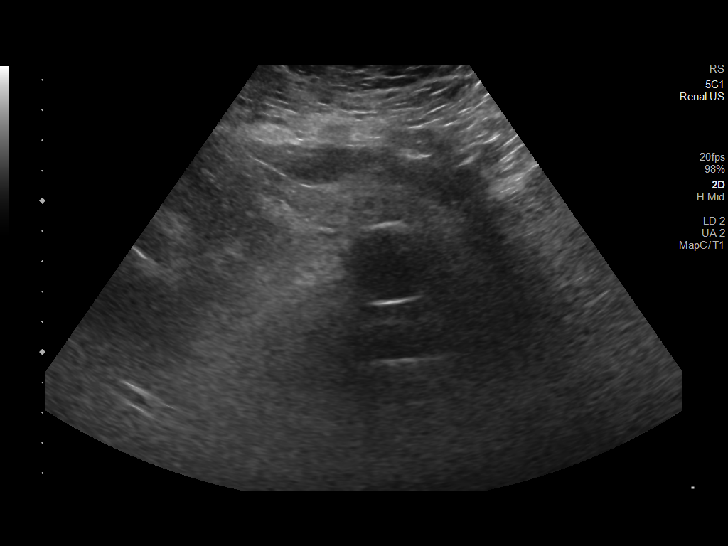
[im 40/44]
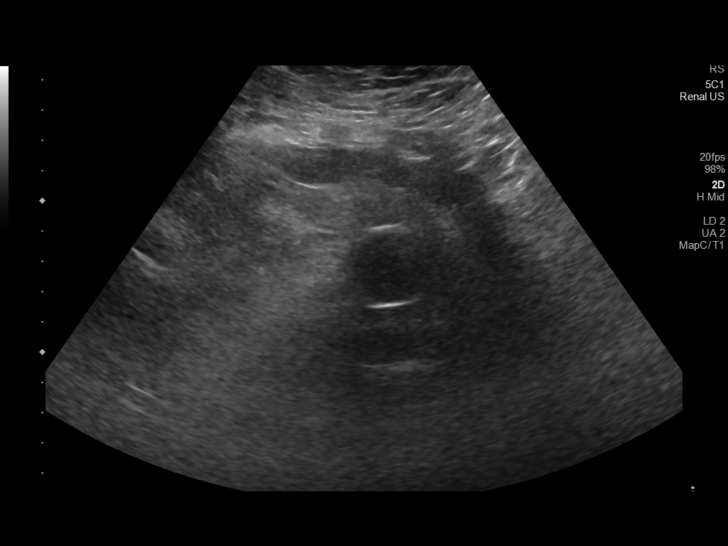
[im 44/44]
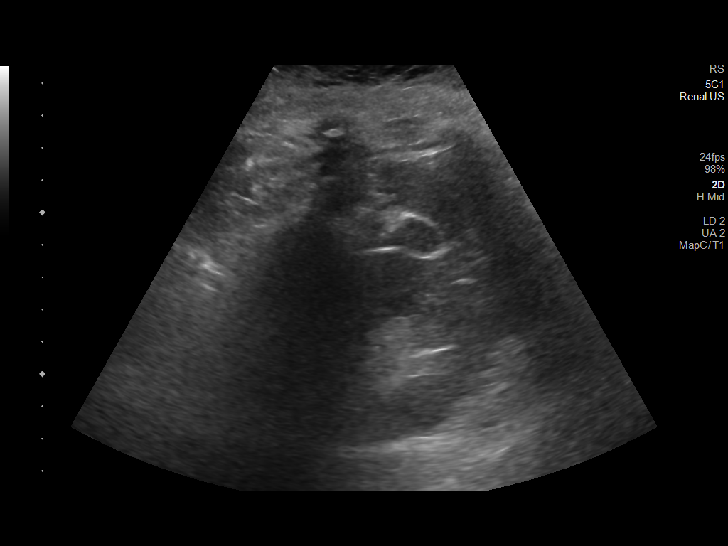

[14 of 25 positions shown; findings below may reference images not displayed]

FINDINGS: Right Kidney:

Renal measurements: 11.2 x 5.2 x 5.2 cm = volume: 159 mL.
Echogenicity within normal limits. No mass or hydronephrosis
visualized. Slightly limited evaluation of the lower pole secondary
to overlying bowel gas.

Left Kidney:

Renal measurements: 10.8 x 5.8 x 5.7 cm = volume: 186 mL.
Echogenicity within normal limits. No mass or hydronephrosis
visualized. Slightly limited evaluation of the lower pole secondary
to overlying bowel gas.

Bladder:

A Foley catheter balloon is seen within the pelvis with complete
decompression of the bladder.

Other:

Cholelithiasis is incidentally noted. Small splenule noted adjacent
to the inferior pole of the spleen.
IMPRESSION: Slightly limited but unremarkable renal sonogram.

Cholelithiasis

## 2021-02-15 MED ORDER — PHENOL 1.4 % MT LIQD
1.0000 | OROMUCOSAL | Status: DC | PRN
  Administered 2021-02-15: 1 via OROMUCOSAL
  Filled 2021-02-15: qty 177

## 2021-02-15 MED ORDER — SODIUM CHLORIDE 0.9 % IV BOLUS
1000.0000 mL | Freq: Once | INTRAVENOUS | Status: AC
  Administered 2021-02-15: 1000 mL via INTRAVENOUS

## 2021-02-15 MED ORDER — SODIUM CHLORIDE 0.9 % IV SOLN: INTRAVENOUS | Status: DC

## 2021-02-15 MED ORDER — SODIUM CHLORIDE 0.9% IV SOLUTION: Freq: Once | INTRAVENOUS | Status: AC

## 2021-02-15 NOTE — Progress Notes (Signed)
Notified pts point of contact, Kayla, that pt is transferring to [facility]

## 2021-02-15 NOTE — Progress Notes (Addendum)
Date and time results received: 02/15/21 0523  Test: Hgb Critical Value: 6.3  Name of Provider Notified: Natale Lay MD  Orders Received? Or Actions Taken?: type & screen, transfuse 1 unit RBC

## 2021-02-15 NOTE — Progress Notes (Signed)
Interventional Radiology Brief Note:  Patient transferred to [facility] for ongoing need of iHD.  IR previously following at [facility] for possible acute cholecystitis and consult for percutaneous cholecystostomy tube placement, however with positive GB filling on HIDA and no signs of acute cholecystitis/inflammation on [place].   VSS. Afebrile for the past 24 hrs.  WBC improved to 13.9 today.   No plans for intervention in IR at this time.  Order canceled. Please consult again if patient condition changes.   Brynda Greathouse, MS RD PA-C

## 2021-02-15 NOTE — Progress Notes (Signed)
eLink Physician-Brief Progress Note Patient Name: Renee Wilkinson DOB: 10/10/1971 MRN: [id number]   Date of Service  02/15/2021  HPI/Events of Note  Notified of Hgb of 6.3 (not in Epic yet) No report of active bleed but did have to change filters on CRRT  eICU Interventions  Ordered to transfuse 1 unit PRBC     Intervention Category Intermediate Interventions: Bleeding - evaluation and treatment with blood products  Judd Lien 02/15/2021, 5:38 AM

## 2021-02-15 NOTE — Progress Notes (Addendum)
[facility] Progress Note  Subjective: pt seen in room, off CRRT.  Is thirsty. Has NG tube in.    Vitals:   02/15/21 1000 02/15/21 1041 02/15/21 1100 02/15/21 1155  BP: (!) 124/46 (!) 142/61 (!) 121/92   Pulse: (!) 33 (!) 104 (!) 105   Resp: '19 18 15   '$ Temp:  99.5 F (37.5 C)  98.4 F (36.9 C)  TempSrc:  Axillary  Oral  SpO2: (!) 83% 95% 98%   Weight:      Height:        Exam:  alert, nad   no jvd  Chest cta bilat  Cor reg no RG  Abd soft ntnd no ascites   GU foley w/ minimal amts brown urine   Ext 1-2+ LE edema bilat L> R   Alert, NF, ox3   UA 3/17 - red, large H, 5 ketone, >50 rbc, 0-5 wbc  CXR 3/22 - no edema  total I/O today = 17 L in and 17 L out  wt's = 104kg admit, peak 108kg, today 105kg   Assessment/ Plan: 1. Renal- AKI w/ CPK > 50,000 on admission. 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 No bacteremia or afib. SP CRRT 3/17- 3/23. No urine output yet.  Transferred to [facility] now will have iHD as needed until kidneys recover. Next HD prob on Friday.  2. Hypertension/volume: BP's good, no vol excess, minimal LE edema. At admit weight. Since NPO will start IVF"s.  3. Rhabdomyolysis: was down for > 6 hrs, peak CPK > 50K on admit 3/17.  Much better, CPK down to 3 K today.  4. Abd pain: RUQ US showing gallbladder sludge. Gen surgery evaluating. ^LFT's are likely muscle enzymes associated w/ her rhabdo incident. Coming down.  5. Substance abuse: her husband is deceased following the incident.  Spiritual care involved and psychiatry consulted  6. Shock: resolved, off levophed, trop peak > 4000, TTE with EF 60-65% and no WMA, RV OK. Resolved and off pressors since 3/19.  7. Dispo: remains in ICU   Kelly Splinter, MD 02/15/2021, 2:34 PM      Recent Labs  Lab 02/14/21 0413 02/14/21 1713 02/15/21 0458  K 4.6 4.4 4.4  BUN 29* 31* 27*  CREATININE 3.26* 3.55* 2.76*  CALCIUM 7.5* 7.7* 7.4*  PHOS  3.2 3.8 2.4*  HGB 7.1*  --  6.3*   Inpatient medications: . sodium chloride   Intravenous Once  . Chlorhexidine Gluconate Cloth  6 each Topical Daily  . famotidine  20 mg Per Tube Daily  . heparin  5,000 Units Subcutaneous Q8H  . mouth rinse  15 mL Mouth Rinse BID  . OLANZapine  5 mg Per Tube QHS  . sertraline  50 mg Oral Daily  . sodium chloride flush  10-40 mL Intracatheter Q12H  . traZODone  150 mg Oral QHS   . piperacillin-tazobactam (ZOSYN)  IV Stopped (02/15/21 1154)   ALPRAZolam, opium-belladonna, docusate, HYDROmorphone (DILAUDID) injection, ondansetron (ZOFRAN) IV, oxyCODONE, phenol, sodium chloride flush

## 2021-02-15 NOTE — TOC Progression Note (Signed)
Transition of Care [facility]) - Progression Note    Patient Details  Name: Renee Wilkinson MRN: [id number] Date of Birth: [birth year]  Transition of [facility]) CM/SW Contact  Leeroy Cha, RN Phone Number: 02/15/2021, 7:37 AM  Clinical Narrative:    Significant Hospital Events: Including procedures, antibiotic start and stop dates in addition to other pertinent events    Admitted to ICU 3/17, VDRF, started CRRT  3/17 R IJ HD cath, exchanged over wire on 3/19>   3/18 more awake, mottling of feet, numbness left leg> concern for compartment syndrome, consulted orthopedics, no compartment syndrome, started on neosynephrine overnight, psychiatry consult  3/18 RUQ ultrasound > positive murphy's but no GB wall thickening; there are stones and sludge present, upper limits of normal CBD  3/19 coox 55, awake, started on levophed  3/19 TTE> LVEF 60-65%, RV OK  3/20 off vasopressors, still has left leg pain/weakness  3/20 General surgery consult for small bowel obstruction through ventral hernia  3/20 CT abdomen/pelvis> Ventral hernia, small bowel obstruction  3/20 CT thoracic/lumbar spine> advanced bilateral facet arthropathy with gaping joints which would worsen with statnding or flexion, could have spinal compression at this level  3/20 HD catheter pulled out, held CRRT  3/20 Neuro consult for left leg weakness and numbness> rec MRI lumbar spine  3/21 remains on CRRT, gen surgery evaluating for perc chole          3/21 MRI thoracic and lumbar spine > extensive posterior paraspinous muscles consistent with myositis, no abscess; R posterior muscle edema thorughout lumbar spine extensing into thoracic and cervical spine consistent with myositis, disc and facet degeneration at L4-5  3/22 bowel movement  3/22 CT ab/pelvis with oral contrast >>>  Plan is for homeless shelter,Remains on CRRT,iv precedex,levophed,heparin,iv zosyn, bun down to 27, creat 2.76, wbc 13.9, hgb this am 6.3 md  aware.   Expected Discharge Plan: Home/Self Care Barriers to Discharge: Continued Medical Work up  Expected Discharge Plan and Services Expected Discharge Plan: Home/Self Care   Discharge Planning Services: CM Consult   Living arrangements for the past 2 months: No permanent address,Homeless Shelter                                       Social Determinants of Health (SDOH) Interventions    Readmission Risk Interventions No flowsheet data found.

## 2021-02-15 NOTE — Progress Notes (Signed)
PT Cancellation Note  Patient Details Name: Colista Mccrobie MRN: [id number] DOB: Feb 23, 1971   Cancelled Treatment:    Reason Eval/Treat Not Completed: Medical issues which prohibited therapy (pt's hemoglobin/hematocrit levels below threshold for participation in PT. Will follow up as pt appropriate and schedule allows.)  Elna Breslow, SPT  Acute rehab  Elna Breslow 02/15/2021, 11:16 AM

## 2021-02-15 NOTE — Progress Notes (Signed)
Progress Note     Subjective: Patient reports pain in RUQ and also increased pain in LLE. She wants something to drink. Had a BM and passing some flatus.   Objective: Vital signs in last 24 hours: Temp:  [97.9 F (36.6 C)-98.6 F (37 C)] 98.3 F (36.8 C) (03/23 0800) Pulse Rate:  [87-106] 102 (03/23 0800) Resp:  [13-24] 13 (03/23 0800) BP: (99-130)/(36-111) 118/60 (03/23 0800) SpO2:  [93 %-100 %] 99 % (03/23 0800) Weight:  [105.7 kg] 105.7 kg (03/23 0500) Last BM Date: 02/14/21  Intake/Output from previous day: 03/22 0701 - 03/23 0700 In: 1750.3 [P.O.:60; I.V.:604.5; NG/GT:955; IV Piggyback:130.8] Out: 2219 [Urine:20; Emesis/NG output:150] Intake/Output this shift: Total I/O In: 0.4 [IV Piggyback:0.4] Out: 77 [Other:50]  PE: General: pleasant, WD, obese female who is laying in bed and appears uncomfortable HEENT: sclera anicteric Heart: sinus tachycardia in the low 100s Lungs: CTAB, no wheezes, rhonchi, or rales noted.  Respiratory effort nonlabored Abd: soft, generalized ttp with more focally pronounced ttp in RUQ, +Murphy sign, ND, +BS, reduced umbilical hernia    Lab Results:  Recent Labs    02/14/21 0413 02/15/21 0458  WBC 19.2* 13.9*  HGB 7.1* 6.3*  HCT 22.4* 20.1*  PLT 151 136*   BMET Recent Labs    02/14/21 1713 02/15/21 0458  NA 135 135  K 4.4 4.4  CL 102 103  CO2 23 23  GLUCOSE 98 94  BUN 31* 27*  CREATININE 3.55* 2.76*  CALCIUM 7.7* 7.4*   PT/INR Recent Labs    02/12/21 1700  LABPROT 12.7  INR 1.0   CMP     Component Value Date/Time   NA 135 02/15/2021 0458   K 4.4 02/15/2021 0458   CL 103 02/15/2021 0458   CO2 23 02/15/2021 0458   GLUCOSE 94 02/15/2021 0458   BUN 27 (H) 02/15/2021 0458   CREATININE 2.76 (H) 02/15/2021 0458   CALCIUM 7.4 (L) 02/15/2021 0458   PROT 5.2 (L) 02/15/2021 0458   ALBUMIN 1.9 (L) 02/15/2021 0458   ALBUMIN 1.9 (L) 02/15/2021 0458   AST 228 (H) 02/15/2021 0458   ALT 139 (H) 02/15/2021 0458    ALKPHOS 105 02/15/2021 0458   BILITOT 0.8 02/15/2021 0458   GFRNONAA 20 (L) 02/15/2021 0458   Lipase  No results found for: LIPASE     Studies/Results: CT ABDOMEN PELVIS WO CONTRAST  Result Date: 02/14/2021 CLINICAL DATA:  Evaluate for bowel obstruction. EXAM: CT ABDOMEN AND PELVIS WITHOUT CONTRAST TECHNIQUE: Multidetector CT imaging of the abdomen and pelvis was performed following the standard protocol without IV contrast. COMPARISON:  CT AP 02/12/2021 FINDINGS: Lower chest: Persistent atelectasis within both lung bases. No pleural effusion identified. Hepatobiliary: There is no focal liver abnormality. Gallstones are noted layering within the gallbladder. No signs of gallbladder wall inflammation or bile duct dilatation. Pancreas: Unremarkable. No pancreatic ductal dilatation or surrounding inflammatory changes. Spleen: Normal in size without focal abnormality. Adrenals/Urinary Tract: Normal appearance of the adrenal glands. The kidneys are unremarkable. No urinary tract calculi or hydronephrosis. The bladder is collapsed around a Foley catheter. Stomach/Bowel: There is an NG tube with tip in body of stomach. The stomach appears decompressed. Dilated small bowel loops with air-fluid levels are again identified. Small bowel loops measure up to 4.2 cm in diameter. The appendix is visualized and appears normal. Enteric contrast material is identified throughout the colon up to the level of the rectum. Vascular/Lymphatic: Mild aortic atherosclerosis. No aneurysm. No abdominopelvic adenopathy. Reproductive: Uterus and  bilateral adnexa are unremarkable. Other: No free fluid or fluid collections. No pneumoperitoneum identified. Small ventral midline abdominal wall hernia just below the level of the umbilicus is noted. This contains fat and a small amount of fluid. Musculoskeletal: Degenerative disc disease identified at L5-S1. No acute or suspicious osseous findings. IMPRESSION: 1. Persistent dilated small  bowel loops with air-fluid levels compatible with partial small bowel obstruction. The transition point is in the right lower quadrant of the abdomen and may be secondary to adhesive disease. Enteric contrast material from previous CT has progressed to the colon up to the level of the rectum. No signs of pneumatosis or pneumoperitoneum. 2. Gallstones. 3. Aortic atherosclerosis. Aortic Atherosclerosis (ICD10-I70.0). Electronically Signed   By: Kerby Moors M.D.   On: 02/14/2021 12:31   DG Chest 1 View  Result Date: 02/14/2021 CLINICAL DATA:  50 year old female enteric tube advanced. EXAM: CHEST  1 VIEW COMPARISON:  0442 hours the same day and earlier. FINDINGS: Portable AP semi upright view at 0659 hours. Enteric tube advanced further into the stomach, side hole now the level of the gastric body. Continued gas-filled small bowel loops in the mid abdomen. Low lung volumes. Partially visible left neck approach central line. No acute osseous abnormality identified. IMPRESSION: 1. Enteric tube advanced with side hole now the level of the gastric body. 2. Continued gas pattern suspicious for small bowel obstruction or ileus. Electronically Signed   By: Genevie Ann M.D.   On: 02/14/2021 07:23   MR CERVICAL SPINE WO CONTRAST  Result Date: 02/13/2021 CLINICAL DATA:  Myelopathy, acute or progressive. Suspect infection. Drug abuse. EXAM: MRI CERVICAL SPINE WITHOUT CONTRAST TECHNIQUE: Multiplanar, multisequence MR imaging of the cervical spine was performed. No intravenous contrast was administered. COMPARISON:  None. FINDINGS: Alignment: Mild retrolisthesis C5-6 otherwise normal alignment. Straightening of the cervical lordosis Vertebrae: Negative for fracture or mass. No evidence of spinal infection on unenhanced imaging. Cord: Normal signal and morphology. Posterior Fossa, vertebral arteries, paraspinal tissues: Edema in the right posterior paraspinous muscles extending from C4 through the upper thoracic spine. No  focal fluid collection. Disc levels: C2-3: Mild left foraminal narrowing due to facet hypertrophy. C3-4: Mild facet degeneration bilaterally.  Negative for stenosis C4-5: Bilateral facet degeneration left greater than right. Disc degeneration and uncinate spurring. Mild foraminal narrowing bilaterally. C5-6: Mild retrolisthesis. Disc degeneration with diffuse uncinate spurring and bilateral facet degeneration. Moderate to severe foraminal encroachment bilaterally right greater than left. Mild spinal stenosis C6-7: Mild facet degeneration bilaterally. Mild right foraminal stenosis C7-T1: Mild right foraminal narrowing due to facet degeneration. IMPRESSION: Muscle edema in the right posterior paraspinous muscles without fluid collection. This extends into the thoracic spine and likely is due to myositis. No abscess Degenerative changes in the thoracic spine. Multilevel spinal and foraminal stenosis due to spurring. Electronically Signed   By: Franchot Gallo M.D.   On: 02/13/2021 10:03   MR THORACIC SPINE WO CONTRAST  Result Date: 02/13/2021 CLINICAL DATA:  Myelopathy, acute or progressive. History of drug abuse. EXAM: MRI THORACIC SPINE WITHOUT CONTRAST TECHNIQUE: Multiplanar, multisequence MR imaging of the thoracic spine was performed. No intravenous contrast was administered. COMPARISON:  None. FINDINGS: Alignment:  Normal alignment.  Mild thoracic kyphosis. Vertebrae: Normal bone marrow. Negative for fracture, mass, or bone infection. No evidence of discitis. Cord: Cord evaluation limited by motion. There is significant CSF flow artifact in the spinal canal. Allowing for this, no suspicious cord lesion identified. Paraspinal and other soft tissues: Extensive edema in the right paraspinous  muscles. This is only seen on the right side. Diffuse low-density edema is present without focal fluid collection. This extends into the cervical spine as described separately. Disc levels: Multilevel disc degeneration with  disc space narrowing and mild spurring throughout much of the thoracic spine. No focal disc protrusion or significant spinal stenosis. IMPRESSION: Extensive muscle edema on the right involving the posterior paraspinous muscles. Findings most compatible with myositis. No abscess identified on unenhanced images. Motion degraded study. Electronically Signed   By: Franchot Gallo M.D.   On: 02/13/2021 10:21   MR LUMBAR SPINE WO CONTRAST  Result Date: 02/13/2021 CLINICAL DATA:  Acute myelopathy.  History of drug abuse. EXAM: MRI LUMBAR SPINE WITHOUT CONTRAST TECHNIQUE: Multiplanar, multisequence MR imaging of the lumbar spine was performed. No intravenous contrast was administered. COMPARISON:  CT lumbar spine 02/12/2021 FINDINGS: Segmentation:  Normal Motion degraded study. Patient not able to tolerate axial images and terminated the study early. Alignment: Slight anterolisthesis L4-5 and mild retrolisthesis L5-S1 Vertebrae: No evidence of fracture or mass. No bone marrow edema. No evidence of bone infection or discitis. Conus medullaris and cauda equina: Conus extends to the approximately L1-2 level. Conus and cauda equina appear normal. Paraspinal and other soft tissues: Diffuse muscle edema in the right posterior paraspinous muscles. This extends into the cervical and thoracic spine as reported separately. No fluid collection identified. Disc levels: L1-2: Negative L2-3: Negative L3-4: Negative L4-5: Mild anterolisthesis. Disc bulging and bilateral facet degeneration. Moderate spinal stenosis as identified on CT. L5-S1: Disc degeneration with diffuse posterior endplate spurring. Moderate subarticular and foraminal stenosis bilaterally due to spurring. IMPRESSION: 1. Right posterior muscle edema throughout the lumbar spine extending into the thoracic and cervical spine most consistent with myositis. No abscess or fluid collection identified 2. Disc and facet degeneration L4-5 and L5-S1, chronic and unchanged from  recent CT. Electronically Signed   By: Franchot Gallo M.D.   On: 02/13/2021 10:25   DG Chest Port 1 View  Result Date: 02/13/2021 CLINICAL DATA:  Central line placement. EXAM: PORTABLE CHEST 1 VIEW COMPARISON:  02/12/2021 and CT chest 02/09/2021. FINDINGS: Removal of right IJ catheter with placement of a left IJ catheter. Tip is in the low SVC. Nasogastric tube terminates in the stomach with the side port in the region of the gastroesophageal junction. Heart size is accentuated by AP semi upright technique and low lung volumes. Minimal streaky atelectasis in the left lung base. No definite pleural fluid. No pneumothorax. IMPRESSION: 1. Interval left IJ central line placement without complicating feature. 2. Nasogastric tube could be advanced several cm to better position the side port below the gastroesophageal junction, as clinically indicated. 3. Low lung volumes with minimal left basilar subsegmental atelectasis Electronically Signed   By: Lorin Picket M.D.   On: 02/13/2021 12:50   DG Abd Portable 1V  Result Date: 02/14/2021 CLINICAL DATA:  Small-bowel obstruction. EXAM: PORTABLE ABDOMEN - 1 VIEW COMPARISON:  Radiograph 02/12/2021 FINDINGS: Transesophageal tube tip terminates in the left upper quadrant with the side port near the GE junction. Recommend advancing 3-5 cm for optimal positioning. Telemetry leads and external devices overlie the upper abdomen and lower chest. Persistently air distended loops of clustered small bowel in the mid to upper abdomen. Overall degree of distention is not significantly changed from prior. No suspicious abdominal calcifications. No acute osseous abnormality or suspicious osseous lesion. Remaining soft tissues are unremarkable. IMPRESSION: 1. Unchanged small bowel dilatation compatible with obstruction. 2. Transesophageal tube tip terminates in the left  upper quadrant with the side port near the GE junction. Recommend advancing 3-5 cm for optimal positioning. These  results will be called to the ordering clinician or representative by the Radiologist Assistant, and communication documented in the PACS or [employer]. Electronically Signed   By: Lovena Le M.D.   On: 02/14/2021 05:27   US ABDOMEN LIMITED RUQ (LIVER/GB)  Result Date: 02/13/2021 CLINICAL DATA:  Acute cholecystitis. EXAM: ULTRASOUND ABDOMEN LIMITED RIGHT UPPER QUADRANT COMPARISON:  CT abdomen pelvis 02/12/2021. FINDINGS: Gallbladder: Numerous shadowing echogenic stones in the gallbladder, measuring up to 1.2 cm. Sludge is seen as well. No wall thickening. Negative sonographic Murphy sign. Common bile duct: Diameter: Suboptimally visualized due to body habitus, measures approximately 3 mm. Liver: Suboptimally visualized due to body habitus. Diffusely increased in echogenicity. No definite focal lesion. Portal vein is patent on color Doppler imaging with normal direction of blood flow towards the liver. Other: None. IMPRESSION: 1. Cholelithiasis and sludge.  No evidence of acute cholecystitis. 2. Hepatic steatosis. Electronically Signed   By: Lorin Picket M.D.   On: 02/13/2021 14:16    Anti-infectives: Anti-infectives (From admission, onward)   Start     Dose/Rate Route Frequency Ordered Stop   02/13/21 1400  piperacillin-tazobactam (ZOSYN) IVPB 3.375 g        3.375 g 12.5 mL/hr over 240 Minutes Intravenous Every 8 hours 02/13/21 0826     02/13/21 0815  piperacillin-tazobactam (ZOSYN) IVPB 3.375 g        3.375 g 100 mL/hr over 30 Minutes Intravenous  Once 02/13/21 0733 02/13/21 0820   02/09/21 1015  ceFEPIme (MAXIPIME) 2 g in sodium chloride 0.9 % 100 mL IVPB        2 g 200 mL/hr over 30 Minutes Intravenous  Once 02/09/21 1010 02/09/21 1112   02/09/21 1015  vancomycin (VANCOCIN) IVPB 1000 mg/200 mL premix        1,000 mg 200 mL/hr over 60 Minutes Intravenous  Once 02/09/21 1010 02/09/21 1901       Assessment/Plan Substance abuse Rhabdomyolysis with hyperkalemia ARF - HD cath  came out.  CRRT/HD.  Cr 2.76.  Per CCM RLE weakness. Secondary to lumbar spine stenosis.   Umbilical hernia - intermittent incarceration with pSBO - CT yesterday with some persistently dilated small bowel loops. Patient having bowel function, hernia remains reduced on exam - ok to trial NGT clamping this AM and allow ice chips and sips of clears as tolerated Gallstones/Elevated transaminases/RUQ abdominal pain - WBC 13.9K today - LFTs are still up but downtrending.  May be secondary to some shock liver, but doesn't possibly preclude cholecystitis secondary to cholestasis. - HIDA done yesterday but not yet read, she is still VERY tender in her RUQ c/w cholecystitis - if HIDA is positive, I would recommend percutaneous cholecystostomy placement   FEN - NGT clamping, sips and chips VTE - heparin ID - zosyn  LOS: 6 days    Norm Parcel , [facility] Surgery 02/15/2021, 8:25 AM Please see Amion for pager number during day hours 7:00am-4:30pm

## 2021-02-15 NOTE — Progress Notes (Signed)
NAME:  Renee Wilkinson, MRN:  [id number], DOB:  1971/01/27, LOS: 6 ADMISSION DATE:  02/09/2021, CONSULTATION DATE:  3/17 REFERRING MD:  EDP, CHIEF COMPLAINT:  Abdominal pain   Brief History:  50 year old admits to cocaine use who was found down beside that husband admitted with hyperkalemia, renal failure, shock liver with chief complaint of abdominal pain with imaging consistent with renal infarcts. Admitted to ICU, started CRRT. Has developed small bowel obstruction since admission, has significant myositis of   Pertinent  Medical History  Psychosis with behavioral health admit in 2021  Significant Hospital Events: Including procedures, antibiotic start and stop dates in addition to other pertinent events   . Admitted to ICU 3/17, VDRF, started CRRT . 3/17 R IJ HD cath, exchanged over wire on 3/19>  . 3/18 more awake, mottling of feet, numbness left leg> concern for compartment syndrome, consulted orthopedics, no compartment syndrome, started on neosynephrine overnight, psychiatry consult . 3/18 RUQ ultrasound > positive murphy's but no GB wall thickening; there are stones and sludge present, upper limits of normal CBD . 3/19 coox 55, awake, started on levophed . 3/19 TTE> LVEF 60-65%, RV OK . 3/20 off vasopressors, still has left leg pain/weakness . 3/20 General surgery consult for small bowel obstruction through ventral hernia . 3/20 CT abdomen/pelvis> Ventral hernia, small bowel obstruction . 3/20 CT thoracic/lumbar spine> advanced bilateral facet arthropathy with gaping joints which would worsen with statnding or flexion, could have spinal compression at this level . 3/20 HD catheter pulled out, held CRRT . 3/20 Neuro consult for left leg weakness and numbness> rec MRI lumbar spine . 3/21 remains on CRRT, gen surgery evaluating for perc chole  . 3/21 MRI thoracic and lumbar spine > extensive posterior paraspinous muscles consistent with myositis, no abscess; R posterior muscle edema  thorughout lumbar spine extensing into thoracic and cervical spine consistent with myositis, disc and facet degeneration at L4-5 . 3/22 bowel movement . 3/22 CT ab/pelvis with oral contrast >>> persistently dilated small bowel loops, partial SBO . 3/22 HIDA scan >>> delayed GB filling, excluding acute cholecystitis.   Interim History / Subjective:   Overnight no issues. She is having BM and passing gas below. Received 1 unit prbc this morning for anemia. On CRRT. Has had filter clot twice already.   Objective   Blood pressure (!) 106/29, pulse (!) 105, temperature 98.4 F (36.9 C), temperature source Oral, resp. rate 19, height '5\' 5"'$  (1.651 m), weight 105.7 kg, SpO2 98 %.        Intake/Output Summary (Last 24 hours) at 02/15/2021 0851 Last data filed at 02/15/2021 0800 Gross per 24 hour  Intake 1645.74 ml  Output 2118 ml  Net -472.26 ml   Filed Weights   02/14/21 0055 02/14/21 0433 02/15/21 0500  Weight: 107.3 kg 107.3 kg 105.7 kg    Examination:  General:  No distress, resting comfortably HENT: mmm PULM: CTBA, no wheezes or crackles CV: RRR GI: hypoactive bowel sounds, soft, no guarding, diffuse ttp MSK: normal bulk and tone Neuro: awake, alert, no distress, MAEW   Labs/imaging personally reviewed   3/17 SARS COV 2/Flu > neg 3/17 blood cx >   Resolved Hospital Problem list   Acute respiratory failure with hypoxemia due to inability to protect airway Mild hypotension/crrt related? Acute metabolic encephalopathy due to drug overdose> resolved Need for sedation for mechanical ventilation > resolved Shock liver  Assessment & Plan:   Anuric AKI, rhabdomyolysis due to myositis from paraspinal and leg musculature Renal  infarct Dependent on CRRT Monitor BMET and UOP Replace electrolytes as needed Continue CRRT for now, will switch to iHD when able. Still awaiting uop with foley.   Chronic Cholecystitis HIDA scan and CT imaging negative for acute  cholecystitis Continue zosyn per surgery No plans for percutaneous drain placement or surgery at this time Will need continued monitoring and outpatient follow up  Small Bowel Obstruction - partial, resolving slowly - NG tube clamped this morning - appreciate surgery recommendations, may be able to d/c ng tube today.  Left leg numbness/weakness L4-5 spinal stenosis and disc bulging Severe myositis of paraspinal musculature F/u neurology recommendations PT consult  Acute opioid overdose with suicidal ideation Acute grief> husband died Schizophrenia Depression Continue olanzapine, zoloft, trazodone  Has contracted for safety, no current plans for self harm. Prn xanax Minimize sedation F/u with psych post discharge  Demand ischemia after being found down post narcotic overdose Echo OK, she does not have cardiogenic shock Shock has resolved Monitor hemodynamics tele  Best practice (evaluated daily)  Diet:  NPO Pain/Anxiety/Delirium protocol (if indicated): No VAP protocol (if indicated): Not indicated DVT prophylaxis: Subcutaneous Heparin GI prophylaxis: N/A Glucose control:  SSI No Central venous access:  Yes, and it is still needed Arterial line:  N/A Foley:  Yes, and it is still needed Mobility:  OOB  PT consulted: Yes Last date of multidisciplinary goals of care discussion [3/17] I called her daughter on 3/22, no answer Code Status:  full code Disposition: remain in ICU  Labs   CBC: Recent Labs  Lab 02/09/21 0830 02/10/21 0506 02/11/21 0402 02/12/21 0500 02/13/21 0559 02/14/21 0413 02/15/21 0458  WBC 30.6*   < > 16.2* 18.8* 22.6* 19.2* 13.9*  NEUTROABS 21.0*  --  11.7*  --  12.9*  --  6.9  HGB 15.9*   < > 10.5* 9.2* 7.9* 7.1* 6.3*  HCT 49.5*   < > 31.9* 28.4* 24.3* 22.4* 20.1*  MCV 91.2   < > 88.1 89.9 90.0 91.8 91.4  PLT 370   < > 151 133* 145* 151 136*   < > = values in this interval not displayed.    Basic Metabolic Panel: Recent Labs  Lab  02/11/21 0402 02/11/21 1138 02/12/21 0500 02/12/21 1320 02/13/21 0559 02/13/21 1600 02/14/21 0413 02/14/21 1713 02/15/21 0458  NA 129*  129*   < > 130*  130*   < > 132*  133* 135 134* 135 135  K 5.2*  5.2*   < > 4.8  4.8   < > 4.6  4.6 4.6 4.6 4.4 4.4  CL 94*  94*   < > 97*  97*   < > 99  99 100 102 102 103  CO2 27  26   < > 25  24   < > '24  24 25 22 23 23  '$ GLUCOSE 153*  154*   < > 130*  129*   < > 116*  116* 103* 102* 98 94  BUN 23*  23*   < > 20  20   < > 34*  35* 33* 29* 31* 27*  CREATININE 2.24*  2.27*   < > 2.46*  2.46*   < > 4.34*  4.22* 3.91* 3.26* 3.55* 2.76*  CALCIUM 7.4*  7.4*   < > 7.6*  7.6*   < > 7.5*  7.5* 7.5* 7.5* 7.7* 7.4*  MG 2.4  --  2.4  --  2.7*  --  2.5*  --  2.5*  PHOS 3.4   < > 2.7   < > 4.4 3.6 3.2 3.8 2.4*   < > = values in this interval not displayed.   GFR: Estimated Creatinine Clearance: 29.4 mL/min (A) (by C-G formula based on SCr of 2.76 mg/dL (H)). Recent Labs  Lab 02/09/21 0845 02/09/21 1108 02/10/21 0506 02/12/21 0500 02/13/21 0559 02/14/21 0413 02/15/21 0458  WBC  --   --    < > 18.8* 22.6* 19.2* 13.9*  LATICACIDVEN 4.4* 4.0*  --   --   --   --   --    < > = values in this interval not displayed.    Liver Function Tests: Recent Labs  Lab 02/11/21 0402 02/11/21 1548 02/12/21 0500 02/12/21 1320 02/13/21 0559 02/13/21 1600 02/14/21 0413 02/14/21 1713 02/15/21 0458  AST 808*  --  661*  --  528*  --  372*  --  228*  ALT 387*  --  307*  --  239*  --  187*  --  139*  ALKPHOS 99  --  90  --  99  --  89  --  105  BILITOT 0.8  --  0.8  --  0.5  --  1.1  --  0.8  PROT 6.4*  --  6.3*  --  6.1*  --  5.8*  --  5.2*  ALBUMIN 2.4*  2.4*   < > 2.3*  2.4*   < > 2.3*  2.3* 2.2* 2.2*  2.1* 2.1* 1.9*  1.9*   < > = values in this interval not displayed.   No results for input(s): LIPASE, AMYLASE in the last 168 hours. No results for input(s): AMMONIA in the last 168 hours.  ABG    Component Value Date/Time    HCO3 14.7 (L) 02/09/2021 1105   ACIDBASEDEF 12.7 (H) 02/09/2021 1105   O2SAT 55.9 02/11/2021 0938     Coagulation Profile: Recent Labs  Lab 02/12/21 1700  INR 1.0    Cardiac Enzymes: Recent Labs  Lab 02/09/21 0830 02/15/21 0458  CKTOTAL >50,000* 3,229*    HbA1C: No results found for: HGBA1C  CBG: Recent Labs  Lab 02/09/21 1522  GLUCAP 158*     Critical care time: 35 minutes    The patient is critically ill due to renal failure.  Critical care was necessary to treat or prevent imminent or life-threatening deterioration.  Critical care was time spent personally by me on the following activities: development of treatment plan with patient and/or surrogate as well as nursing, discussions with consultants, evaluation of patient's response to treatment, examination of patient, obtaining history from patient or surrogate, ordering and performing treatments and interventions, ordering and review of laboratory studies, ordering and review of radiographic studies, pulse oximetry, re-evaluation of patient's condition and participation in multidisciplinary rounds.   Critical Care Time devoted to patient care services described in this note is 35 minutes. This time reflects time of care of this [place] . This critical care time does not reflect separately billable procedures or procedure time, teaching time or supervisory time of PA/NP/Med student/Med Resident etc but could involve care discussion time.       [facility] 02/15/2021 8:51 AM  Pager: see AMION After hours pager: [phone]  If no response to pager , please call [phone] until 7pm After 7:00 pm call Elink  [phone]

## 2021-02-16 ENCOUNTER — Encounter (HOSPITAL_COMMUNITY): Payer: Medicaid - Out of State

## 2021-02-16 ENCOUNTER — Inpatient Hospital Stay (HOSPITAL_COMMUNITY): Payer: Medicaid - Out of State

## 2021-02-16 DIAGNOSIS — M79605 Pain in left leg: Secondary | ICD-10-CM

## 2021-02-16 DIAGNOSIS — N179 Acute kidney failure, unspecified: Secondary | ICD-10-CM | POA: Diagnosis not present

## 2021-02-16 DIAGNOSIS — M7989 Other specified soft tissue disorders: Secondary | ICD-10-CM | POA: Diagnosis not present

## 2021-02-16 LAB — TYPE AND SCREEN
ABO/RH(D): O POS
Antibody Screen: NEGATIVE
Unit division: 0

## 2021-02-16 LAB — RENAL FUNCTION PANEL
Albumin: 1.6 g/dL — ABNORMAL LOW (ref 3.5–5.0)
Anion gap: 10 (ref 5–15)
BUN: 38 mg/dL — ABNORMAL HIGH (ref 6–20)
CO2: 21 mmol/L — ABNORMAL LOW (ref 22–32)
Calcium: 7.5 mg/dL — ABNORMAL LOW (ref 8.9–10.3)
Chloride: 103 mmol/L (ref 98–111)
Creatinine, Ser: 4.35 mg/dL — ABNORMAL HIGH (ref 0.44–1.00)
GFR, Estimated: 12 mL/min — ABNORMAL LOW (ref >60)
Glucose, Bld: 94 mg/dL (ref 70–99)
Phosphorus: 3.8 mg/dL (ref 2.5–4.6)
Potassium: 4.1 mmol/L (ref 3.5–5.1)
Sodium: 134 mmol/L — ABNORMAL LOW (ref 135–145)

## 2021-02-16 LAB — CBC
HCT: 23.2 % — ABNORMAL LOW (ref 36.0–46.0)
Hemoglobin: 7.4 g/dL — ABNORMAL LOW (ref 12.0–15.0)
MCH: 29.2 pg (ref 26.0–34.0)
MCHC: 31.9 g/dL (ref 30.0–36.0)
MCV: 91.7 fL (ref 80.0–100.0)
Platelets: 158 10*3/uL (ref 150–400)
RBC: 2.53 MIL/uL — ABNORMAL LOW (ref 3.87–5.11)
RDW: 14.5 % (ref 11.5–15.5)
WBC: 13.1 10*3/uL — ABNORMAL HIGH (ref 4.0–10.5)
nRBC: 0.2 % (ref 0.0–0.2)

## 2021-02-16 LAB — GLUCOSE, CAPILLARY: Glucose-Capillary: 151 mg/dL — ABNORMAL HIGH (ref 70–99)

## 2021-02-16 LAB — BPAM RBC
Blood Product Expiration Date: 202204192359
ISSUE DATE / TIME: 202203230824
Unit Type and Rh: 5100

## 2021-02-16 LAB — MRSA PCR SCREENING: MRSA by PCR: POSITIVE — AB

## 2021-02-16 MED ORDER — PIPERACILLIN-TAZOBACTAM IN DEX 2-0.25 GM/50ML IV SOLN
2.2500 g | Freq: Three times a day (TID) | INTRAVENOUS | Status: DC
  Administered 2021-02-16 – 2021-02-17 (×4): 2.25 g via INTRAVENOUS
  Filled 2021-02-16 (×4): qty 50

## 2021-02-16 MED ORDER — BOOST / RESOURCE BREEZE PO LIQD CUSTOM
1.0000 | Freq: Three times a day (TID) | ORAL | Status: DC
  Administered 2021-02-16: 1 via ORAL

## 2021-02-16 NOTE — Progress Notes (Signed)
Initial Nutrition Assessment  DOCUMENTATION CODES:   Obesity unspecified  INTERVENTION:   - Boost Breeze po TID, each supplement provides 250 kcal and 9 grams of protein  - RD will monitor for diet advancement and adjust oral nutrition supplement regimen as appropriate  NUTRITION DIAGNOSIS:   Inadequate oral intake related to altered GI function as evidenced by other (clear liquid diet).  GOAL:   Patient will meet greater than or equal to 90% of their needs  MONITOR:   PO intake,Supplement acceptance,Diet advancement,Labs,Weight trends,I & O's  REASON FOR ASSESSMENT:   Rounds    ASSESSMENT:   50 year old female who presented to the ED on 3/17 with SOB and abdominal pain after being found down beside her husband who was deceased. PMH of polysubstance abuse, schizophrenia, depression, anxiety. Pt admitted with AKI, hyperkalemia, rhabdomyolysis, shock liver.   3/17 - CRRT initiated 3/20 - pt found to have umbilical hernia with intermittent incarceration with partial SBO, NG tube placed, CRRT held 3/21 - CRRT resumed 3/23 - HIDA negative for acute cholecystitis, NG tube clamped, CRRT stopped, transferred to [facility] 3/24 - NG tube removed, clear liquid diet  Noted plan for iHD on Friday.  Spoke with pt at bedside. Pt drinking from cup at time of visit and reports that she mixed apple juice, cranberry juice, and ginger ale together. Pt is very happy to be able to drink liquids again. She is looking forward to lunch tray coming as she really wants to have some broth. Pt tolerating current clear liquids without issue and denies nausea at this time. Pt willing to consume clear liquid oral nutrition supplement. RD to order [facility].  Pt tearful at times regarding death of her husband but able to provide RD with diet and weight history. Pt reports her UBW is 225 lbs. Pt is unsure whether she has lost weight recently. No weight history available in chart.  Pt reports fair appetite  PTA. Pt reports typically consuming 1 meal daily. She states that if she tried to eat more, she would feel very full and vomit. Pt reports that this has been a big issue for her over the last month but that it began as far back as October 2021. She reports decreased PO intake related to vomiting and abdominal pain. Pt describes experience of vomiting at the grocery store twice due to such intense abdominal pain.  RD will monitor for tolerance of clear liquids and diet advancement vs need for nutrition support. Pt has been NPO or on clear liquids since 3/20.  Admit weight: 108.1 kg Current weight: 113 kg Lowest weight: 105.7 kg  Medications reviewed and include: pepcid, zyprexa, IV abx IVF: NS @ 75 ml/hr  Labs reviewed: sodium 134, BUN 38, creatinine 4.35, hemoglobin 8.4  UOP: 0 ml x 24 hours I/O's: +1.2 L since admit  NUTRITION - FOCUSED PHYSICAL EXAM:  Flowsheet Row Most Recent Value  Orbital Region Mild depletion  Upper Arm Region No depletion  Thoracic and Lumbar Region No depletion  Buccal Region No depletion  Temple Region No depletion  Clavicle Bone Region Mild depletion  Clavicle and Acromion Bone Region Mild depletion  Scapular Bone Region No depletion  Dorsal Hand Mild depletion  Patellar Region No depletion  Anterior Thigh Region No depletion  Posterior Calf Region No depletion  Edema (RD Assessment) Moderate  [BUE, BLE]  Hair Reviewed  Eyes Reviewed  Mouth Reviewed  Skin Reviewed  Nails Reviewed       Diet Order:  Diet Order            Diet clear liquid Room service appropriate? Yes; Fluid consistency: Thin  Diet effective now                 EDUCATION NEEDS:   No education needs have been identified at this time  Skin:  Skin Assessment: Skin Integrity Issues: DTI: left great toe Other: MASD to abdomen  Last BM:  02/15/21  Height:   Ht Readings from Last 1 Encounters:  02/09/21 '5\' 5"'$  (1.651 m)    Weight:   Wt Readings from Last 1  Encounters:  02/16/21 113 kg    BMI:  Body mass index is 41.46 kg/m.  Estimated Nutritional Needs:   Kcal:  1800-2000  Protein:  85-100 grams  Fluid:  1.8-2.0 L    Gustavus Bryant, MS, RD, LDN Inpatient Clinical Dietitian Please see AMiON for contact information.

## 2021-02-16 NOTE — Progress Notes (Signed)
CC: Found down, renal failure, liver shock  Subjective: Patient is lying in bed.  She is fairly comfortable.  She says she has had no nausea or discomfort with the NG clamped.  She notices no difference from when it was on suction.  She is still somewhat tender in the right upper quadrant.  Otherwise exam is unremarkable. She reports passing flatus and a small BM yesterday.  She reports her tenderness is still primary the upper abdomen on the right side. Objective: Vital signs in last 24 hours: Temp:  [98.3 F (36.8 C)-99.5 F (37.5 C)] 98.7 F (37.1 C) (03/24 0746) Pulse Rate:  [33-105] 83 (03/24 0800) Resp:  [14-24] 21 (03/24 0800) BP: (105-149)/(29-92) 122/51 (03/24 0800) SpO2:  [83 %-100 %] 98 % (03/24 0800) Weight:  [105.7 kg-113 kg] 113 kg (03/24 0454) Last BM Date: 02/15/21 30 p.o. recorded  1660 IV 0 urine recorded Nothing recorded through the NG/emesis. No BM recorded. Afebrile T-max 99.5, blood pressure is stable.  Tachycardia, slightly improved Sats 90 to 98% on room air. No CBC this AM. NA 134, potassium 4.1, CO2 21, BUN 38, creatinine 4.35, albumin 1.6.  GFR is 12  Intake/Output from previous day: 03/23 0701 - 03/24 0700 In: 1692.1 [P.O.:30; I.V.:446.9; Blood:325; IV Piggyback:890.2] Out: 134  Intake/Output this shift: No intake/output data recorded.  General appearance: alert, cooperative and no distress Resp: clear to auscultation bilaterally GI: Soft, tender right upper quadrant right upper mid abdomen.  She reports BM, positive flatus.  No nausea or distention with the NG clamped.  Hernia remains reduced.  Lab Results:  Recent Labs    02/15/21 0458 02/15/21 1355  WBC 13.9* 15.1*  HGB 6.3* 8.4*  HCT 20.1* 26.3*  PLT 136* 163    BMET Recent Labs    02/15/21 0458 02/16/21 0450  NA 135 134*  K 4.4 4.1  CL 103 103  CO2 23 21*  GLUCOSE 94 94  BUN 27* 38*  CREATININE 2.76* 4.35*  CALCIUM 7.4* 7.5*   PT/INR No results for input(s):  LABPROT, INR in the last 72 hours.  Recent Labs  Lab 02/11/21 0402 02/11/21 1548 02/12/21 0500 02/12/21 1320 02/13/21 0559 02/13/21 1600 02/14/21 0413 02/14/21 1713 02/15/21 0458 02/16/21 0450  AST 808*  --  661*  --  528*  --  372*  --  228*  --   ALT 387*  --  307*  --  239*  --  187*  --  139*  --   ALKPHOS 99  --  90  --  99  --  89  --  105  --   BILITOT 0.8  --  0.8  --  0.5  --  1.1  --  0.8  --   PROT 6.4*  --  6.3*  --  6.1*  --  5.8*  --  5.2*  --   ALBUMIN 2.4*  2.4*   < > 2.3*  2.4*   < > 2.3*  2.3* 2.2* 2.2*  2.1* 2.1* 1.9*  1.9* 1.6*   < > = values in this interval not displayed.     Lipase  No results found for: LIPASE   Medications: . Chlorhexidine Gluconate Cloth  6 each Topical Daily  . famotidine  20 mg Per Tube Daily  . heparin  5,000 Units Subcutaneous Q8H  . mouth rinse  15 mL Mouth Rinse BID  . OLANZapine  5 mg Per Tube QHS  . sertraline  50 mg  Oral Daily  . sodium chloride flush  10-40 mL Intracatheter Q12H  . traZODone  150 mg Oral QHS    Assessment/Plan Substance abuse Rhabdomyolysis with hyperkalemia ARF - HD cath came out. CRRT>> HD. Cr 2.58>>3.91>>3.26>>2.76>>4.35 RLE weakness. Secondary to lumbar spine stenosis.  Anemia - transfused 02/15/21 Obesity BMI [id number]  Umbilical hernia - intermittent incarceration with pSBO - CT yesterday with some persistently dilated small bowel loops. Patient having bowel function, hernia remains reduced on exam - ok to trial NGT clamping this AM and allow ice chips and sips of clears as tolerated Gallstones/Elevated transaminases/RUQ abdominal pain - WBC13.9K today - LFTs are still up but downtrending. May be secondary to some shock liver, but doesn't possibly preclude cholecystitis secondary to cholestasis.  - AST 1303>>808>>528>>228(3/23)  - ALT 524>>387>>293>>139(3/23)  - TBil 0.9>>0.8(3/23) - HIDA done yesterday:  Delayed filling of gallbladder, following IV administration of morphine  confirming patency of cystic duct and excluding acute cholecystitis.  May reflect chronic cholecystitis.  Patent common bile duct with prompt biliary to bowel activity.  FEN - NGT clamping, sips and chips VTE - heparin ID - zosyn   Plan: We will remove the NG and start her on clears, continue to follow with you.      LOS: 7 days    JENNINGS,WILLARD 02/16/2021 Please see Amion

## 2021-02-16 NOTE — Progress Notes (Addendum)
Pharmacy Antibiotic Note  Renee Wilkinson is a 50 y.o. female admitted on 02/09/2021 after being found unresponsive due to toxic ingestion. Patient with acute renal failure and renal infarcts in rhabdo, was on CRRT, now on HD. Patient found to have partial small bowel obstruction. NG tube removed and patient tolerating clear liquid diet. No perforation suspected. HIDA scan on 3/24 showed delayed filling of gallbladder, no acute cholecystitis. Pharmacy has been consulted for Zosyn dosing. WBC 13.1, afebrile.  Plan:  Reduce dose of piperacillin/tazobactam to 2.25 g q8h for transition from CRRT to HD  Monitor WBC, temperatures, and length of therapy  Height: '5\' 5"'$  (165.1 cm) Weight: 113 kg (249 lb 1.9 oz) IBW/kg (Calculated) : 57  Temp (24hrs), Avg:98.6 F (37 C), Min:97.8 F (36.6 C), Max:99.2 F (37.3 C)  Recent Labs  Lab 02/13/21 0559 02/13/21 1600 02/14/21 0413 02/14/21 1713 02/15/21 0458 02/15/21 1355 02/16/21 0450 02/16/21 1011  WBC 22.6*  --  19.2*  --  13.9* 15.1*  --  13.1*  CREATININE 4.34*  4.22* 3.91* 3.26* 3.55* 2.76*  --  4.35*  --     Estimated Creatinine Clearance: 19.4 mL/min (A) (by C-G formula based on SCr of 4.35 mg/dL (H)).    No Known Allergies  Antimicrobials this admission: Zosyn 3/21 >>   Dose adjustments this admission: Zosyn 3.375 g q6h >> 2.5 g q8h  Microbiology results: 3/17 MRSA PCR - positive 3/17 BCx - negative  Thank you for allowing pharmacy to be a part of this patient's care.  Benna Dunks 02/16/2021 1:35 PM

## 2021-02-16 NOTE — Progress Notes (Signed)
Physical Therapy Treatment Patient Details Name: Renee Wilkinson MRN: [id number] DOB: 13-Nov-1971 Today's Date: 02/16/2021    History of Present Illness Pt found down (next to deceased spouse) after smoking crack cocaine and now with AKI, hyperkalemia, rhabdomyolysis, and leukocytosis.  Pt with hx of substance abuse    PT Comments    Pt admitted with above diagnosis. Pt was able to be Egressed to full chair position and sit in this position for 10 minutes.  Pt sat with back unsupported but was using UEs for support.  Pt performed LE exercises.  Fatigues quickly.  LEFt LE very edematous and pt to get [place] today per PA.  Will continue to progress pt as ble.  Pt currently with functional limitations due to balance and endurance deficits. Pt will benefit from skilled PT to increase their independence and safety with mobility to allow discharge to the venue listed below.     Follow Up Recommendations  CIR     Equipment Recommendations  None recommended by PT    Recommendations for Other Services OT consult     Precautions / Restrictions Precautions Precautions: Fall Restrictions Weight Bearing Restrictions: No    Mobility  Bed Mobility Overal bed mobility: Needs Assistance             General bed mobility comments: Used Bed egress to sit pt up. Pt then used rails to pull herself into sitting. Scooted to edge of chair position.  Performed LE exercises as well.    Transfers                 General transfer comment: NT - pt limited by fatigue, abdominal pain and left LE with no active movement in sitting and would not support pts weight to stand from egress position  Ambulation/Gait                 Stairs             Wheelchair Mobility    Modified Rankin (Stroke Patients Only)       Balance Overall balance assessment: Needs assistance Sitting-balance support: Bilateral upper extremity supported;Feet supported Sitting balance-Leahy Scale:  Poor Sitting balance - Comments: relies on UE support to sit unsupported.  Pt performed LAQ right and hip flexion right but could not move left LE sitting EOB.                                    Cognition Arousal/Alertness: Awake/alert Behavior During Therapy: Restless Overall Cognitive Status: No family/caregiver present to determine baseline cognitive functioning                                 General Comments: Anxious and with slow processing.  Inconsistent following cues.      Exercises General Exercises - Lower Extremity Ankle Circles/Pumps: Both;AAROM;5 reps;Supine Long Arc Quad: AROM;Right;15 reps;Seated Heel Slides: AAROM;Left;5 reps;Supine Hip Flexion/Marching: AROM;15 reps;Right;Seated    General Comments General comments (skin integrity, edema, etc.): VSS on 3LO2      Pertinent Vitals/Pain Pain Assessment: Faces Faces Pain Scale: Hurts whole lot Pain Location: abdomen, left LE Pain Descriptors / Indicators: Guarding;Grimacing;Sore;Moaning Pain Intervention(s): Monitored during session;Limited activity within patient's tolerance;Repositioned;Premedicated before session    Home Living                      Prior  Function            PT Goals (current goals can now be found in the care plan section) Acute Rehab PT Goals Patient Stated Goal: Get better Progress towards PT goals: Progressing toward goals    Frequency    Min 3X/week      PT Plan Current plan remains appropriate    Co-evaluation              AM-PAC PT "6 Clicks" Mobility   Outcome Measure  Help needed turning from your back to your side while in a flat bed without using bedrails?: A Lot Help needed moving from lying on your back to sitting on the side of a flat bed without using bedrails?: A Lot Help needed moving to and from a bed to a chair (including a wheelchair)?: Total Help needed standing up from a chair using your arms (e.g., wheelchair  or bedside chair)?: Total Help needed to walk in hospital room?: Total Help needed climbing 3-5 steps with a railing? : Total 6 Click Score: 8    End of Session Equipment Utilized During Treatment: Oxygen Activity Tolerance: Patient limited by fatigue;Patient limited by pain Patient left: in bed;with call bell/phone within reach;with bed alarm set Nurse Communication: Mobility status PT Visit Diagnosis: Muscle weakness (generalized) (M62.81);Pain;History of falling (Z91.81);Difficulty in walking, not elsewhere classified (R26.2) Pain - part of body:  (abdomen)     Time: [id number] PT Time Calculation (min) (ACUTE ONLY): 26 min  Charges:  $Therapeutic Exercise: 8-22 mins $Therapeutic Activity: 8-22 mins                    Renee Wilkinson,PT Acute Rehab Services [phone] (pager)   Renee Wilkinson 02/16/2021, 1:37 PM

## 2021-02-16 NOTE — Progress Notes (Signed)
NAME:  Renee Wilkinson, MRN:  [id number], DOB:  1971/07/07, LOS: 7 ADMISSION DATE:  02/09/2021, CONSULTATION DATE:  3/17 REFERRING MD:  EDP, CHIEF COMPLAINT:  Abdominal pain   Brief History:  50 year old admits to cocaine use who was found down beside that husband admitted with hyperkalemia, renal failure, shock liver with chief complaint of abdominal pain with imaging consistent with renal infarcts. Admitted to ICU, started CRRT. Has developed small bowel obstruction since admission, has significant myositis.  Pertinent  Medical History  Psychosis with behavioral health admit in 2021  Significant Hospital Events: Including procedures, antibiotic start and stop dates in addition to other pertinent events   . Admitted to ICU 3/17, VDRF, started CRRT . 3/17 R IJ HD cath, exchanged over wire on 3/19>  . 3/18 more awake, mottling of feet, numbness left leg> concern for compartment syndrome, consulted orthopedics, no compartment syndrome, started on neosynephrine overnight, psychiatry consult . 3/18 RUQ ultrasound > positive murphy's but no GB wall thickening; there are stones and sludge present, upper limits of normal CBD . 3/19 coox 55, awake, started on levophed . 3/19 TTE> LVEF 60-65%, RV OK . 3/20 off vasopressors, still has left leg pain/weakness . 3/20 General surgery consult for small bowel obstruction through ventral hernia . 3/20 CT abdomen/pelvis> Ventral hernia, small bowel obstruction . 3/20 CT thoracic/lumbar spine> advanced bilateral facet arthropathy with gaping joints which would worsen with statnding or flexion, could have spinal compression at this level . 3/20 HD catheter pulled out, held CRRT . 3/20 Neuro consult for left leg weakness and numbness> rec MRI lumbar spine . 3/21 remains on CRRT, gen surgery evaluating for perc chole  . 3/21 MRI thoracic and lumbar spine > extensive posterior paraspinous muscles consistent with myositis, no abscess; R posterior muscle edema  thorughout lumbar spine extensing into thoracic and cervical spine consistent with myositis, disc and facet degeneration at L4-5 . 3/22 bowel movement . 3/22 CT ab/pelvis with oral contrast >>> persistently dilated small bowel loops, partial SBO . 3/22 HIDA scan >>> delayed GB filling, excluding acute cholecystitis.   Interim History / Subjective:  Tx [facility] yesterday for [facility]HD  NG out, starting clears per surgery Still c/o abd pain but improving.  C/o LLE pain  Objective   Blood pressure (!) 122/51, pulse (!) 105, temperature 98.7 F (37.1 C), temperature source Oral, resp. rate 19, height '5\' 5"'$  (1.651 m), weight 113 kg, SpO2 97 %.        Intake/Output Summary (Last 24 hours) at 02/16/2021 0950 Last data filed at 02/16/2021 0800 Gross per 24 hour  Intake 1724.45 ml  Output 12 ml  Net 1712.45 ml   Filed Weights   02/15/21 0500 02/15/21 1443 02/16/21 0454  Weight: 105.7 kg 105.7 kg 113 kg    Examination:  General:  wdwn female, NAD  HENT: mmm, no JVD, NG out PULM: CTBA, no wheezes or crackles, resps even non labored on RA CV: RRR GI: hypoactive bowel sounds, soft, tender diffusely but no guarding MSK: normal bulk and tone Neuro: awake, alert, no distress, MAEW   Labs/imaging personally reviewed   3/17 SARS COV 2/Flu > neg 3/17 blood cx > NEG  Resolved Hospital Problem list   Acute respiratory failure with hypoxemia due to inability to protect airway Mild hypotension/crrt related? Acute metabolic encephalopathy due to drug overdose> resolved Need for sedation for mechanical ventilation > resolved Shock liver  Assessment & Plan:   Anuric AKI, rhabdomyolysis due to myositis from paraspinal and  leg musculature Renal infarct PLAN  Monitor BMET and UOP Replace electrolytes as needed Off cvvhd Renal following - plan for iHD Friday   Chronic Cholecystitis HIDA scan and CT imaging negative for acute cholecystitis Continue zosyn per surgery No plans for percutaneous  drain placement or surgery at this time Will need continued monitoring and outpatient follow up  Small Bowel Obstruction - partial, resolving slowly - NG tube clamped this morning - appreciate surgery recommendations, NG out, starting clears slowly  Left leg numbness/weakness L4-5 spinal stenosis and disc bulging Severe myositis of paraspinal musculature F/u neurology recommendations PT consult Venous doppler   Acute opioid overdose with suicidal ideation Acute grief> husband died Schizophrenia Depression Continue olanzapine, zoloft, trazodone  Prn xanax Minimize sedation F/u with psych post discharge  Demand ischemia after being found down post narcotic overdose Echo OK, she does not have cardiogenic shock Shock has resolved Monitor hemodynamics tele  Best practice (evaluated daily)  Diet:  Clear liquids Pain/Anxiety/Delirium protocol (if indicated): No VAP protocol (if indicated): Not indicated DVT prophylaxis: Subcutaneous Heparin GI prophylaxis: N/A Glucose control:  SSI No Central venous access:  Yes, and it is still needed Arterial line:  N/A Foley:  Yes, and it is still needed Mobility:  OOB  PT consulted: Yes Last date of multidisciplinary goals of care discussion [3/17] I called her daughter on 3/22, no answer Code Status:  full code Disposition: tx PCU, TRH to assume care 3/25  Labs   CBC: Recent Labs  Lab 02/11/21 0402 02/12/21 0500 02/13/21 0559 02/14/21 0413 02/15/21 0458 02/15/21 1355  WBC 16.2* 18.8* 22.6* 19.2* 13.9* 15.1*  NEUTROABS 11.7*  --  12.9*  --  6.9  --   HGB 10.5* 9.2* 7.9* 7.1* 6.3* 8.4*  HCT 31.9* 28.4* 24.3* 22.4* 20.1* 26.3*  MCV 88.1 89.9 90.0 91.8 91.4 91.3  PLT 151 133* 145* 151 136* [id number]    Basic Metabolic Panel: Recent Labs  Lab 02/11/21 0402 02/11/21 1138 02/12/21 0500 02/12/21 1320 02/13/21 0559 02/13/21 1600 02/14/21 0413 02/14/21 1713 02/15/21 0458 02/16/21 0450  NA 129*  129*   < > 130*  130*   < >  132*  133* 135 134* 135 135 134*  K 5.2*  5.2*   < > 4.8  4.8   < > 4.6  4.6 4.6 4.6 4.4 4.4 4.1  CL 94*  94*   < > 97*  97*   < > 99  99 100 102 102 103 103  CO2 27  26   < > 25  24   < > '24  24 25 22 23 23 '$ 21*  GLUCOSE 153*  154*   < > 130*  129*   < > 116*  116* 103* 102* 98 94 94  BUN 23*  23*   < > 20  20   < > 34*  35* 33* 29* 31* 27* 38*  CREATININE 2.24*  2.27*   < > 2.46*  2.46*   < > 4.34*  4.22* 3.91* 3.26* 3.55* 2.76* 4.35*  CALCIUM 7.4*  7.4*   < > 7.6*  7.6*   < > 7.5*  7.5* 7.5* 7.5* 7.7* 7.4* 7.5*  MG 2.4  --  2.4  --  2.7*  --  2.5*  --  2.5*  --   PHOS 3.4   < > 2.7   < > 4.4 3.6 3.2 3.8 2.4* 3.8   < > = values in this interval not displayed.  GFR: Estimated Creatinine Clearance: 19.4 mL/min (A) (by C-G formula based on SCr of 4.35 mg/dL (H)). Recent Labs  Lab 02/09/21 1108 02/10/21 0506 02/13/21 0559 02/14/21 0413 02/15/21 0458 02/15/21 1355  WBC  --    < > 22.6* 19.2* 13.9* 15.1*  LATICACIDVEN 4.0*  --   --   --   --   --    < > = values in this interval not displayed.    Liver Function Tests: Recent Labs  Lab 02/11/21 0402 02/11/21 1548 02/12/21 0500 02/12/21 1320 02/13/21 0559 02/13/21 1600 02/14/21 0413 02/14/21 1713 02/15/21 0458 02/16/21 0450  AST 808*  --  661*  --  528*  --  372*  --  228*  --   ALT 387*  --  307*  --  239*  --  187*  --  139*  --   ALKPHOS 99  --  90  --  99  --  89  --  105  --   BILITOT 0.8  --  0.8  --  0.5  --  1.1  --  0.8  --   PROT 6.4*  --  6.3*  --  6.1*  --  5.8*  --  5.2*  --   ALBUMIN 2.4*  2.4*   < > 2.3*  2.4*   < > 2.3*  2.3* 2.2* 2.2*  2.1* 2.1* 1.9*  1.9* 1.6*   < > = values in this interval not displayed.   No results for input(s): LIPASE, AMYLASE in the last 168 hours. No results for input(s): AMMONIA in the last 168 hours.  ABG    Component Value Date/Time   HCO3 14.7 (L) 02/09/2021 1105   ACIDBASEDEF 12.7 (H) 02/09/2021 1105   O2SAT 55.9 02/11/2021 0938      Coagulation Profile: Recent Labs  Lab 02/12/21 1700  INR 1.0    Cardiac Enzymes: Recent Labs  Lab 02/15/21 0458  CKTOTAL 3,229*    HbA1C: No results found for: HGBA1C  CBG: Recent Labs  Lab 02/09/21 1522 02/15/21 [place], NP Pulmonary/Critical Care Medicine  02/16/2021  9:50 AM

## 2021-02-17 DIAGNOSIS — J9601 Acute respiratory failure with hypoxia: Secondary | ICD-10-CM

## 2021-02-17 DIAGNOSIS — G9340 Encephalopathy, unspecified: Secondary | ICD-10-CM

## 2021-02-17 DIAGNOSIS — M6282 Rhabdomyolysis: Secondary | ICD-10-CM | POA: Diagnosis not present

## 2021-02-17 DIAGNOSIS — N179 Acute kidney failure, unspecified: Secondary | ICD-10-CM | POA: Diagnosis not present

## 2021-02-17 LAB — RENAL FUNCTION PANEL
Albumin: 1.6 g/dL — ABNORMAL LOW (ref 3.5–5.0)
Anion gap: 9 (ref 5–15)
BUN: 49 mg/dL — ABNORMAL HIGH (ref 6–20)
CO2: 20 mmol/L — ABNORMAL LOW (ref 22–32)
Calcium: 7.4 mg/dL — ABNORMAL LOW (ref 8.9–10.3)
Chloride: 100 mmol/L (ref 98–111)
Creatinine, Ser: 5.58 mg/dL — ABNORMAL HIGH (ref 0.44–1.00)
GFR, Estimated: 9 mL/min — ABNORMAL LOW (ref >60)
Glucose, Bld: 109 mg/dL — ABNORMAL HIGH (ref 70–99)
Phosphorus: 4 mg/dL (ref 2.5–4.6)
Potassium: 3.9 mmol/L (ref 3.5–5.1)
Sodium: 129 mmol/L — ABNORMAL LOW (ref 135–145)

## 2021-02-17 LAB — CBC
HCT: 20.7 % — ABNORMAL LOW (ref 36.0–46.0)
Hemoglobin: 6.6 g/dL — CL (ref 12.0–15.0)
MCH: 29.3 pg (ref 26.0–34.0)
MCHC: 31.9 g/dL (ref 30.0–36.0)
MCV: 92 fL (ref 80.0–100.0)
Platelets: 181 10*3/uL (ref 150–400)
RBC: 2.25 MIL/uL — ABNORMAL LOW (ref 3.87–5.11)
RDW: 14.3 % (ref 11.5–15.5)
WBC: 13.9 10*3/uL — ABNORMAL HIGH (ref 4.0–10.5)
nRBC: 0.1 % (ref 0.0–0.2)

## 2021-02-17 LAB — MAGNESIUM: Magnesium: 2.3 mg/dL (ref 1.7–2.4)

## 2021-02-17 MED ORDER — DOCUSATE SODIUM 50 MG/5ML PO LIQD
100.0000 mg | Freq: Two times a day (BID) | ORAL | Status: DC | PRN
  Administered 2021-02-27 (×2): 100 mg via ORAL
  Filled 2021-02-17 (×3): qty 10

## 2021-02-17 MED ORDER — FAMOTIDINE 20 MG PO TABS
20.0000 mg | ORAL_TABLET | Freq: Every day | ORAL | Status: DC
  Administered 2021-02-18 – 2021-03-07 (×18): 20 mg via ORAL
  Filled 2021-02-17 (×18): qty 1

## 2021-02-17 MED ORDER — SODIUM CHLORIDE 0.9% IV SOLUTION: Freq: Once | INTRAVENOUS | Status: DC

## 2021-02-17 MED ORDER — OLANZAPINE 5 MG PO TABS
5.0000 mg | ORAL_TABLET | Freq: Every day | ORAL | Status: DC
  Administered 2021-02-17 – 2021-02-19 (×3): 5 mg via ORAL
  Filled 2021-02-17 (×5): qty 1

## 2021-02-17 MED ORDER — ALPRAZOLAM 0.5 MG PO TABS
0.5000 mg | ORAL_TABLET | Freq: Three times a day (TID) | ORAL | Status: DC | PRN
  Administered 2021-02-17 – 2021-03-07 (×32): 0.5 mg via ORAL
  Filled 2021-02-17 (×33): qty 1

## 2021-02-17 MED ORDER — ENSURE ENLIVE PO LIQD
237.0000 mL | Freq: Three times a day (TID) | ORAL | Status: DC
  Administered 2021-02-17 – 2021-03-01 (×30): 237 mL via ORAL

## 2021-02-17 NOTE — Progress Notes (Signed)
Patient ID: Renee Wilkinson, female   DOB: 1971-08-15, 50 y.o.   MRN: [id number]   PROGRESS NOTE    Renee Wilkinson  [id number] DOB: 01-05-1971 DOA: 02/09/2021 PCP: Patient, No Pcp Per   Brief Narrative:  50 year old female with history of psychosis, cocaine use was found unresponsive and was admitted with hyperkalemia, renal failure, shock liver with imaging consistent with renal infarcts.  She was intubated and admitted to ICU and started on CRRT.  She required pressors.  She was found to have significant myositis.  There was a concern for compartment syndrome for which orthopedics was consulted who stated that she did not have compartment syndrome.  Right upper quadrant ultrasound was positive for Murphy sign but there was no gallbladder wall thickening but there were stones and sludge present.  She was started on IV antibiotics.  2D echo showed EF of 60 to 65%.  She was subsequently extubated.  She also developed small bowel obstruction for which general surgery was consulted and patient was managed conservatively with NG tube.  Neurology was also subsequently consulted for left leg weakness and numbness: MRI of thoracic and lumbar spine showed extensive myositis with no abscess.  HIDA scan showed delayed gallbladder wall feeling but was negative for acute cholecystitis.  Patient was switched to intermittent hemodialysis and was transferred to [facility].  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 -Nephrology following: Status post CRRT from 02/09/2021-02/15/2021.  Transferred to [facility] for intermittent hemodialysis. -Still on IV fluids.  Urine output very minimal.  Chronic cholecystitis Cholelithiasis with elevated LFTs -HIDA scan and CT imaging negative for acute cholecystitis.  Currently on Zosyn as  per general surgery.  No plans for percutaneous drain placement or surgery at this time.  We will follow-up with general surgery regarding duration of IV antibiotics. -LFTs improving.  Check LFTs again in a.m.  Partial small bowel obstruction secondary to intermittent incarceration of umbilical hernia -General surgery following.  Treated with NG tube and bowel rest.  NG tube has been removed.  Bowel function improving.  Diet being advanced by general surgery.  Left leg numbness/weakness L4-5 spinal stenosis and disc bulging Severe myositis of paraspinal musculature -Neurology evaluated the patient and has signed off. -PT recommended CIR placement.  Consult CR.  Acute opiate overdose Acute grief Schizophrenia/depression -Psychiatry evaluated the patient during this hospitalization: Patient does not need inpatient psychiatric hospitalization.  Outpatient follow-up with psychiatry -Continue olanzapine/Zoloft/trazodone.  As needed Xanax.  Minimize sedation  Demand ischemia -Probably from narcotic overdose.  Echo looked okay.  No further work-up needed  Acute hypoxic respiratory failure -Status post intubation and extubation.  Resolved.  Currently on room air  Acute toxic encephalopathy -Probably from drug overdose.  Mental status has much improved.  Monitor.  Shock liver -LFTs improving.  Monitor intermittently  Shock: Present on admission; possibly from acute kidney injury and overdose -Sepsis was ruled out.  -Needed pressors initially which was subsequently discontinued. -Shock has resolved.  Hemodynamically stable currently.  Leukocytosis -Improving.  Monitor  Anemia of chronic disease -From renal failure.  Hemoglobin 6.6 this morning.  Transfuse 1 unit packed red cells.  Monitor  Thrombocytopenia -Resolved  Hyponatremia  -Nephrology following.  Currently being managed by dialysis.    DVT prophylaxis: Heparin Code Status: Full Family Communication: None at  bedside Disposition Plan: Status is: Inpatient  Remains inpatient appropriate because:Inpatient level of care appropriate due to severity of illness   Dispo: The patient is from: Home              Anticipated d/c is to: CIR              Patient currently is not medically stable to d/c.   Difficult to place patient No   Consultants: PCCM/psychiatry/nephrology/neurology/general surgery/orthopedics  Procedures: Intubation/extubation Right IJ hemodialysis catheter  Antimicrobials:  Anti-infectives (From admission, onward)   Start     Dose/Rate Route Frequency Ordered Stop   02/16/21 1000  piperacillin-tazobactam (ZOSYN) IVPB 2.25 g        2.25 g 100 mL/hr over 30 Minutes Intravenous Every 8 hours 02/16/21 0720     02/13/21 1400  piperacillin-tazobactam (ZOSYN) IVPB 3.375 g  Status:  Discontinued        3.375 g 12.5 mL/hr over 240 Minutes Intravenous Every 8 hours 02/13/21 0826 02/16/21 0720   02/13/21 0815  piperacillin-tazobactam (ZOSYN) IVPB 3.375 g        3.375 g 100 mL/hr over 30 Minutes Intravenous  Once 02/13/21 0733 02/13/21 0820   02/09/21 1015  ceFEPIme (MAXIPIME) 2 g in sodium chloride 0.9 % 100 mL IVPB        2 g 200 mL/hr over 30 Minutes Intravenous  Once 02/09/21 1010 02/09/21 1112   02/09/21 1015  vancomycin (VANCOCIN) IVPB 1000 mg/200 mL premix        1,000 mg 200 mL/hr over 60 Minutes Intravenous  Once 02/09/21 1010 02/09/21 1901       Subjective: Patient seen and examined at bedside.  Had bowel movement this morning.  Complains of lower extremity pain.  Denies worsening shortness of breath or fever.  Objective: Vitals:   02/17/21 0700 02/17/21 0752 02/17/21 0800 02/17/21 0900  BP: (!) 148/89  (!) 158/102 130/71  Pulse: 94  (!) 103 98  Resp: '18  15 15  '$ Temp:  98.2 F (36.8 C)    TempSrc:  Oral    SpO2: 97%  97% 96%  Weight:      Height:        Intake/Output Summary (Last 24 hours) at 02/17/2021 1006 Last data filed at 02/17/2021 0900 Gross per 24  hour  Intake 2532.02 ml  Output 505 ml  Net 2027.02 ml   Filed Weights   02/15/21 1443 02/16/21 0454 02/17/21 0340  Weight: 105.7 kg 113 kg 117.7 kg    Examination:  General exam: Chronically ill looking female.  Looks older than stated age.  Currently on room air.  No distress. Respiratory system: Bilateral decreased breath sounds at bases with scattered crackles  cardiovascular system: S1 & S2 heard, intermittently tachycardic Gastrointestinal system: Abdomen is distended, soft and nontender.  Bowel sounds are heard  extremities: No cyanosis, clubbing; bilateral lower extremity edema present  Central nervous system: Alert and oriented. No focal neurological deficits. Moving extremities Skin: No rashes, lesions or ulcers Psychiatry: Flat affect.  Intermittently looks anxious.    Data Reviewed: I have personally reviewed following labs and imaging studies  CBC: Recent Labs  Lab 02/11/21 0402 02/12/21 0500 02/13/21 0559 02/14/21 0413 02/15/21 0458 02/15/21 1355 02/16/21 1011 02/17/21 0336  WBC 16.2*   < > 22.6* 19.2* 13.9* 15.1* 13.1* 13.9*  NEUTROABS 11.7*  --  12.9*  --  6.9  --   --   --   HGB 10.5*   < > 7.9* 7.1* 6.3* 8.4* 7.4* 6.6*  HCT  31.9*   < > 24.3* 22.4* 20.1* 26.3* 23.2* 20.7*  MCV 88.1   < > 90.0 91.8 91.4 91.3 91.7 92.0  PLT 151   < > 145* 151 136* 163 158 181   < > = values in this interval not displayed.   Basic Metabolic Panel: Recent Labs  Lab 02/12/21 0500 02/12/21 1320 02/13/21 0559 02/13/21 1600 02/14/21 0413 02/14/21 1713 02/15/21 0458 02/16/21 0450 02/17/21 0336  NA 130*  130*   < > 132*  133*   < > 134* 135 135 134* 129*  K 4.8  4.8   < > 4.6  4.6   < > 4.6 4.4 4.4 4.1 3.9  CL 97*  97*   < > 99  99   < > 102 102 103 103 100  CO2 25  24   < > 24  24   < > '22 23 23 '$ 21* 20*  GLUCOSE 130*  129*   < > 116*  116*   < > 102* 98 94 94 109*  BUN 20  20   < > 34*  35*   < > 29* 31* 27* 38* 49*  CREATININE 2.46*  2.46*   < >  4.34*  4.22*   < > 3.26* 3.55* 2.76* 4.35* 5.58*  CALCIUM 7.6*  7.6*   < > 7.5*  7.5*   < > 7.5* 7.7* 7.4* 7.5* 7.4*  MG 2.4  --  2.7*  --  2.5*  --  2.5*  --  2.3  PHOS 2.7   < > 4.4   < > 3.2 3.8 2.4* 3.8 4.0   < > = values in this interval not displayed.   GFR: Estimated Creatinine Clearance: 15.5 mL/min (A) (by C-G formula based on SCr of 5.58 mg/dL (H)). Liver Function Tests: Recent Labs  Lab 02/11/21 0402 02/11/21 1548 02/12/21 0500 02/12/21 1320 02/13/21 0559 02/13/21 1600 02/14/21 0413 02/14/21 1713 02/15/21 0458 02/16/21 0450 02/17/21 0336  AST 808*  --  661*  --  528*  --  372*  --  228*  --   --   ALT 387*  --  307*  --  239*  --  187*  --  139*  --   --   ALKPHOS 99  --  90  --  99  --  89  --  105  --   --   BILITOT 0.8  --  0.8  --  0.5  --  1.1  --  0.8  --   --   PROT 6.4*  --  6.3*  --  6.1*  --  5.8*  --  5.2*  --   --   ALBUMIN 2.4*  2.4*   < > 2.3*  2.4*   < > 2.3*  2.3*   < > 2.2*  2.1* 2.1* 1.9*  1.9* 1.6* 1.6*   < > = values in this interval not displayed.   No results for input(s): LIPASE, AMYLASE in the last 168 hours. No results for input(s): AMMONIA in the last 168 hours. Coagulation Profile: Recent Labs  Lab 02/12/21 1700  INR 1.0   Cardiac Enzymes: Recent Labs  Lab 02/15/21 0458  CKTOTAL 3,229*   BNP (last 3 results) No results for input(s): PROBNP in the last 8760 hours. HbA1C: No results for input(s): HGBA1C in the last 72 hours. CBG: Recent Labs  Lab 02/15/21 1239 02/16/21 2016  GLUCAP 83 151*   Lipid Profile: No results  for input(s): CHOL, HDL, LDLCALC, TRIG, CHOLHDL, LDLDIRECT in the last 72 hours. Thyroid Function Tests: No results for input(s): TSH, T4TOTAL, FREET4, T3FREE, THYROIDAB in the last 72 hours. Anemia Panel: No results for input(s): VITAMINB12, FOLATE, FERRITIN, TIBC, IRON, RETICCTPCT in the last 72 hours. Sepsis Labs: No results for input(s): PROCALCITON, LATICACIDVEN in the last 168 hours.  Recent  Results (from the past 240 hour(s))  Culture, blood (single)     Status: None   Collection Time: 02/09/21 11:09 AM   Specimen: BLOOD  Result Value Ref Range Status   Specimen Description   Final    BLOOD LEFT ANTECUBITAL Performed at [facility], [place] [address] [place], [place] [address]    Special Requests   Final    BOTTLES DRAWN AEROBIC AND ANAEROBIC Blood Culture adequate volume Performed at [facility] [address] [place], [place] [address]    Culture   Final    NO GROWTH 5 DAYS Performed at [facility], [place] [address] [place], [place] [address]    Report Status 02/14/2021 FINAL  Final  Resp Panel by RT-PCR (Flu A&B, Covid) Nasopharyngeal Swab     Status: None   Collection Time: 02/09/21 12:08 PM   Specimen: Nasopharyngeal Swab; Nasopharyngeal(NP) swabs in vial transport medium  Result Value Ref Range Status   SARS Coronavirus 2 by RT PCR NEGATIVE NEGATIVE Final    Comment: (NOTE) SARS-CoV-2 target nucleic acids are NOT DETECTED.  The SARS-CoV-2 RNA is generally detectable in upper respiratory specimens during the acute phase of infection. The lowest concentration of SARS-CoV-2 viral copies this assay can detect is 138 copies/mL. A negative result does not preclude SARS-Cov-2 infection and should not be used as the sole basis for treatment or other patient management decisions. A negative result may occur with  improper specimen collection/handling, submission of specimen other than nasopharyngeal swab, presence of viral mutation(s) within the areas targeted by this assay, and inadequate number of viral copies(<138 copies/mL). A negative result must be combined with clinical observations, patient history, and epidemiological information. The expected result is Negative.  Fact Sheet for Patients:  EntrepreneurPulse.com.au  Fact Sheet for Healthcare Providers:  IncredibleEmployment.be  This  test is no t yet approved or cleared by the [place] FDA and  has been authorized for detection and/or diagnosis of SARS-CoV-2 by FDA under an Emergency Use Authorization (EUA). This EUA will remain  in effect (meaning this test can be used) for the duration of the COVID-19 declaration under Section 564(b)(1) of the Act, 21 U.S.C.section 360bbb-3(b)(1), unless the authorization is terminated  or revoked sooner.       Influenza A by PCR NEGATIVE NEGATIVE Final   Influenza B by PCR NEGATIVE NEGATIVE Final    Comment: (NOTE) The Xpert Xpress SARS-CoV-2/FLU/RSV plus assay is intended as an aid in the diagnosis of influenza from Nasopharyngeal swab specimens and should not be used as a sole basis for treatment. Nasal washings and aspirates are unacceptable for Xpert Xpress SARS-CoV-2/FLU/RSV testing.  Fact Sheet for Patients: EntrepreneurPulse.com.au  Fact Sheet for Healthcare Providers: IncredibleEmployment.be  This test is not yet approved or cleared by the [place] FDA and has been authorized for detection and/or diagnosis of SARS-CoV-2 by FDA under an Emergency Use Authorization (EUA). This EUA will remain in effect (meaning this test can be used) for the duration of the COVID-19 declaration under Section 564(b)(1) of the Act, 21 U.S.C. section 360bbb-3(b)(1), unless the authorization is terminated or  revoked.  Performed at [facility], [place] [address], [place], [place] [address]   MRSA PCR Screening     Status: Abnormal   Collection Time: 02/09/21  1:36 PM   Specimen: Nasopharyngeal  Result Value Ref Range Status   MRSA by PCR POSITIVE (A) NEGATIVE Final    Comment:        The GeneXpert MRSA Assay (FDA approved for NASAL specimens only), is one component of a comprehensive MRSA colonization surveillance program. It is not intended to diagnose MRSA infection nor to guide or monitor treatment for MRSA  infections. RESULT CALLED TO, READ BACK BY AND VERIFIED WITH: T.OLEARY, RN AT 1653 ON 03.17.22 BY N.THOMPSON Performed at [facility] [address] [place], [place] [address]   MRSA PCR Screening     Status: Abnormal   Collection Time: 02/16/21  4:50 AM   Specimen: Nasopharyngeal  Result Value Ref Range Status   MRSA by PCR POSITIVE (A) NEGATIVE Final    Comment:        The GeneXpert MRSA Assay (FDA approved for NASAL specimens only), is one component of a comprehensive MRSA colonization surveillance program. It is not intended to diagnose MRSA infection nor to guide or monitor treatment for MRSA infections. RESULT CALLED TO, READ BACK BY AND VERIFIED WITH: RN K.FRPOUST AT 0830 ON 02/16/2021 BY T.SAAD Performed at [facility], [place] [address] [place], [place] [address]          Radiology Studies: US RENAL  Result Date: 02/15/2021 CLINICAL DATA:  Acute renal insufficiency EXAM: RENAL / URINARY TRACT ULTRASOUND COMPLETE COMPARISON:  None. FINDINGS: Right Kidney: Renal measurements: 11.2 x 5.2 x 5.2 cm = volume: 159 mL. Echogenicity within normal limits. No mass or hydronephrosis visualized. Slightly limited evaluation of the lower pole secondary to overlying bowel gas. Left Kidney: Renal measurements: 10.8 x 5.8 x 5.7 cm = volume: 186 mL. Echogenicity within normal limits. No mass or hydronephrosis visualized. Slightly limited evaluation of the lower pole secondary to overlying bowel gas. Bladder: A Foley catheter balloon is seen within the pelvis with complete decompression of the bladder. Other: Cholelithiasis is incidentally noted. Small splenule noted adjacent to the inferior pole of the spleen. IMPRESSION: Slightly limited but unremarkable renal sonogram. Cholelithiasis Electronically Signed   By: Fidela Salisbury MD   On: 02/15/2021 17:26   VAS [place] LOWER EXTREMITY VENOUS (DVT)  Result Date: 02/16/2021  Lower Venous DVT Study Indications: Swelling,  and Pain.  Comparison Study: No prior. Performing Technologist: Oda Cogan RDMS, RVT  Examination Guidelines: A complete evaluation includes B-mode imaging, spectral Doppler, color Doppler, and power Doppler as needed of all accessible portions of each vessel. Bilateral testing is considered an integral part of a complete examination. Limited examinations for reoccurring indications may be performed as noted. The reflux portion of the exam is performed with the patient in reverse Trendelenburg.  +---------+---------------+---------+-----------+----------+--------------+ LEFT     CompressibilityPhasicitySpontaneityPropertiesThrombus Aging +---------+---------------+---------+-----------+----------+--------------+ CFV      Full           Yes      Yes                                 +---------+---------------+---------+-----------+----------+--------------+ SFJ      Full                                                        +---------+---------------+---------+-----------+----------+--------------+  FV Prox  Full                                                        +---------+---------------+---------+-----------+----------+--------------+ FV Mid   Full                                                        +---------+---------------+---------+-----------+----------+--------------+ FV DistalFull                                                        +---------+---------------+---------+-----------+----------+--------------+ PFV      Full                                                        +---------+---------------+---------+-----------+----------+--------------+ POP      Full           Yes      Yes                                 +---------+---------------+---------+-----------+----------+--------------+ PTV      Full                                                        +---------+---------------+---------+-----------+----------+--------------+  PERO     Full                                                        +---------+---------------+---------+-----------+----------+--------------+     Summary: LEFT: - There is no evidence of deep vein thrombosis in the lower extremity.  - No cystic structure found in the popliteal fossa.  *See table(s) above for measurements and observations. Electronically signed by Monica Martinez MD on 02/16/2021 at 6:26:12 PM.    Final         Scheduled Meds: . sodium chloride   Intravenous Once  . Chlorhexidine Gluconate Cloth  6 each Topical Daily  . famotidine  20 mg Per Tube Daily  . feeding supplement  237 mL Oral TID BM  . heparin  5,000 Units Subcutaneous Q8H  . mouth rinse  15 mL Mouth Rinse BID  . OLANZapine  5 mg Per Tube QHS  . sertraline  50 mg Oral Daily  . sodium chloride flush  10-40 mL Intracatheter Q12H  . traZODone  150 mg Oral QHS   Continuous Infusions: . sodium chloride 75 mL/hr at 02/17/21 0900  . piperacillin-tazobactam (ZOSYN)  IV Stopped (02/17/21 0404)  Aline August, MD [facility] 02/17/2021, 10:07 AM

## 2021-02-17 NOTE — Progress Notes (Signed)
Patient arrived to the unit via bed. She is a&o x4. Respirations even and unlabored. No s/s of distress. VSS. Patient complaining of 8/10 pain to left leg and ankle. She rec'd dilaudid IV 1 hour prior. Explained to patient she may have p.o. oxycodone in 15 minutes. She then asked if she could have dilaudid as well in 1 hour. Patient educated that oxy IR would begin to work within 30 minutes to 1 hour and dilaudid IV should not be given that close. She agreed to repositioning and trying to wait until dilaudid available.  Patient is very emotional and anxious in response to recent passing of her husband. Patients daughter is at bedside.  Tele monitor verified. She is able to make needs known. Call light and possessions in reach. Will continue to monitor.

## 2021-02-17 NOTE — Progress Notes (Addendum)
eLink Physician-Brief Progress Note Patient Name: Renee Wilkinson DOB: 05-29-1971 MRN: [id number]   Date of Service  02/17/2021  HPI/Events of Note  Hemoglobin 6.6 gm / dl. No overt bleeding focus.  eICU Interventions  One unit of PRBC's ordered to be transfused during dialysis. Informed consent was obtained from the patient, witnessed by the bedside RN Marrion Coy.        Kerry Kass Davontay Watlington 02/17/2021, 5:35 AM

## 2021-02-17 NOTE — Progress Notes (Signed)
CC: Found down, renal failure, liver shock  Subjective: Tolerating clears no nausea or vomiting, ongoing discomfort right upper quadrant and left thigh.  Asking for pain medicines.  Objective: Vital signs in last 24 hours: Temp:  [97.7 F (36.5 C)-99 F (37.2 C)] 98.2 F (36.8 C) (03/25 0752) Pulse Rate:  [88-111] 94 (03/25 0700) Resp:  [13-25] 18 (03/25 0700) BP: (106-160)/(62-89) 148/89 (03/25 0700) SpO2:  [91 %-99 %] 97 % (03/25 0700) Weight:  [117.7 kg] 117.7 kg (03/25 0340) Last BM Date: 02/15/21 820 p.o. 1800 IV 530 urine No BM recorded T-max 99.3, vital signs are stable. Sodium is 129, potassium 3.9, creatinine 5.58, WBC 13.9 WBC 13.9 H/H 7.4/23.2>> 6.6/20.7(3/25)   Intake/Output from previous day: 03/24 0701 - 03/25 0700 In: 2619.9 [P.O.:820; I.V.:1649.9; IV Piggyback:150] Out: 530 [Urine:530] Intake/Output this shift: No intake/output data recorded.   General appearance: alert, cooperative and no distress Resp: clear to auscultation bilaterally GI: Soft, sore, tender in the right upper quadrant and left thigh.  Tolerating clear liquids no nausea or discomfort with p.o. so far.  Lab Results:  Recent Labs    02/16/21 1011 02/17/21 0336  WBC 13.1* 13.9*  HGB 7.4* 6.6*  HCT 23.2* 20.7*  PLT 158 181    BMET Recent Labs    02/16/21 0450 02/17/21 0336  NA 134* 129*  K 4.1 3.9  CL 103 100  CO2 21* 20*  GLUCOSE 94 109*  BUN 38* 49*  CREATININE 4.35* 5.58*  CALCIUM 7.5* 7.4*   PT/INR No results for input(s): LABPROT, INR in the last 72 hours.  Recent Labs  Lab 02/11/21 0402 02/11/21 1548 02/12/21 0500 02/12/21 1320 02/13/21 0559 02/13/21 1600 02/14/21 0413 02/14/21 1713 02/15/21 0458 02/16/21 0450 02/17/21 0336  AST 808*  --  661*  --  528*  --  372*  --  228*  --   --   ALT 387*  --  307*  --  239*  --  187*  --  139*  --   --   ALKPHOS 99  --  90  --  99  --  89  --  105  --   --   BILITOT 0.8  --  0.8  --  0.5  --  1.1  --   0.8  --   --   PROT 6.4*  --  6.3*  --  6.1*  --  5.8*  --  5.2*  --   --   ALBUMIN 2.4*  2.4*   < > 2.3*  2.4*   < > 2.3*  2.3*   < > 2.2*  2.1* 2.1* 1.9*  1.9* 1.6* 1.6*   < > = values in this interval not displayed.     Lipase  No results found for: LIPASE   Medications: . sodium chloride   Intravenous Once  . Chlorhexidine Gluconate Cloth  6 each Topical Daily  . famotidine  20 mg Per Tube Daily  . feeding supplement  1 Container Oral TID BM  . heparin  5,000 Units Subcutaneous Q8H  . mouth rinse  15 mL Mouth Rinse BID  . OLANZapine  5 mg Per Tube QHS  . sertraline  50 mg Oral Daily  . sodium chloride flush  10-40 mL Intracatheter Q12H  . traZODone  150 mg Oral QHS   . sodium chloride 75 mL/hr at 02/17/21 0700  . piperacillin-tazobactam (ZOSYN)  IV Stopped (02/17/21 0404)    Assessment/Plan Substance abuse Rhabdomyolysis with  hyperkalemia ARF - HD cath came out. CRRT>> HD. Cr2.58>>3.91>>3.26>>2.76>>4.35>>5.58 RLE weakness. Secondary to lumbar spine stenosis.  Anemia - transfused 02/15/21 Obesity BMI [id number]  Umbilical hernia - intermittent incarceration with pSBO - CT yesterday with some persistently dilated small bowel loops. Patient having bowel function, hernia remains reduced on exam - ok to trial NGT clamping this AM and allow ice chips and sips of clears as tolerated Gallstones/Elevated transaminases/RUQ abdominal pain - WBC13.9K today - LFTs are still up but downtrending. May be secondary to some shock liver, but doesn't possibly preclude cholecystitis secondary to cholestasis.  - AST 1303>>808>>528>>228(3/23)  - ALT 524>>387>>293>>139(3/23)  - TBil 0.9>>0.8(3/23) -HIDA done yesterday:  Delayed filling of gallbladder, following IV administration of morphine confirming patency of cystic duct and excluding acute cholecystitis.  May reflect chronic cholecystitis.  Patent common bile duct with prompt biliary to bowel activity.  FEN -IV fluids/clear  liquids VTE - heparin ID -vancomycin x 1 02/09/21;  zosyn 3/21-3/24 Pain: Morphine 0.5x5; oxycodone 5 mg x 3  Plan: I will advance her diet as tolerated.  She does not appear to have acute cholecystitis we will be available as needed.  Please call if we can be of further assistance.       LOS: 8 days    JENNINGS,WILLARD 02/17/2021 Please see Amion

## 2021-02-17 NOTE — Progress Notes (Signed)
[facility] Progress Note  Subjective: pt seen in ICU, starting to have some movt in her lle. Her biggest issue is RUQ abd pain which actually is impeding her breathing. Unable to take a deep breath due to pain. She started eating slowly now and endorses thirst and appetite. She does report some nausea and dysgeusia. Denies SOB at rest, pruritis, hiccups, brain fog.  Vitals:   02/17/21 1200 02/17/21 1300 02/17/21 1331 02/17/21 1400  BP: 115/73 139/69 (!) 142/72 135/81  Pulse: (!) 102 99 (!) 103 (!) 102  Resp: 20 17 (!) 22 17  Temp:   98.2 F (36.8 C)   TempSrc:   Oral   SpO2: 97% 98% 96% 96%  Weight:      Height:        Exam: Gen: nad, sitting up in recliner CV: s1s2, rrr Resp: cta b/l Abd: soft Ext: tense edema bilateral LEs Neuro: awake, alert, speech clear and coherent Dialysis Access: Left IJ temp line    Assessment/ Plan: 1. AKI w/ CPK > 50,000 on admission. 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 No bacteremia or afib. SP CRRT 3/17- 3/23.  Had about 500cc of urine output, monitor closely. Holding on HD today, tentatively planning for HD tomorrow via left ij temp cath as she has not shown any signs of renal recovery. Stop IVF. 2. Hypertension/volume: BP's good, b/l LE's edematous, UF as tolerated w/ HD  3. Rhabdomyolysis: was down for > 6 hrs, peak CPK > 50K on admit 3/17.  improved 4. Abd pain: RUQ US showing gallbladder sludge. Gen surgery evaluating. ^LFT's are likely muscle enzymes associated w/ her rhabdo incident. Does have signs of choleycystitis on imaging--mgmt per primary 5. Substance abuse: her husband is deceased following the incident.  Spiritual care involved and psychiatry consulted  6. Shock: resolved, off levophed, trop peak > 4000, TTE with EF 60-65% and no WMA, RV OK. Resolved and off pressors since 3/19.  7. Dispo: remains in ICU, transferring to floor   Gean Quint, MD [place] 02/17/2021, 3:07 PM      Recent Labs  Lab 02/16/21 0450 02/16/21 1011 02/17/21 0336  K 4.1  --  3.9  BUN 38*  --  49*  CREATININE 4.35*  --  5.58*  CALCIUM 7.5*  --  7.4*  PHOS 3.8  --  4.0  HGB  --  7.4* 6.6*   Inpatient medications: . sodium chloride   Intravenous Once  . Chlorhexidine Gluconate Cloth  6 each Topical Daily  . [START ON 02/18/2021] famotidine  20 mg Oral Daily  . feeding supplement  237 mL Oral TID BM  . heparin  5,000 Units Subcutaneous Q8H  . mouth rinse  15 mL Mouth Rinse BID  . OLANZapine  5 mg Oral QHS  . sertraline  50 mg Oral Daily  . sodium chloride flush  10-40 mL Intracatheter Q12H  . traZODone  150 mg Oral QHS    ALPRAZolam, opium-belladonna, docusate, HYDROmorphone (DILAUDID) injection, ondansetron (ZOFRAN) IV, oxyCODONE, phenol, sodium chloride flush

## 2021-02-17 NOTE — Progress Notes (Signed)
Physical Therapy Treatment Patient Details Name: Renee Wilkinson MRN: [id number] DOB: 1971/05/28 Today's Date: 02/17/2021    History of Present Illness Pt found down (next to deceased spouse) after smoking crack cocaine and now with AKI, hyperkalemia, rhabdomyolysis, and leukocytosis.  Pt with hx of substance abuse    PT Comments    Pt admitted with above diagnosis. Pt was able to scoot to the recliner with +2 mod assist with incr assist due to pt fearful and difficulty placing feet in good positions during the transfer. Pt very motivated and is progressing and wants to get better.  She did get tearful talking about her husband.   Pt currently with functional limitations due to balance and endurance deficits. Pt will benefit from skilled PT to increase their independence and safety with mobility to allow discharge to the venue listed below.     Follow Up Recommendations  CIR     Equipment Recommendations  None recommended by PT    Recommendations for Other Services OT consult     Precautions / Restrictions Precautions Precautions: Fall Restrictions Weight Bearing Restrictions: No    Mobility  Bed Mobility Overal bed mobility: Needs Assistance Bed Mobility: Rolling;Sidelying to Sit;Sit to Supine Rolling: Mod assist Sidelying to sit: Mod assist;+2 for safety/equipment   Removed bed pan and cleaned pt initially prior to mobilizing.      General bed mobility comments: Pt mod assist to elevated trunk and to help LES to EOB.  Use of pad to scoot out.    Transfers Overall transfer level: Needs assistance   Transfers: Lateral/Scoot Transfers          Lateral/Scoot Transfers: Mod assist;Min assist;From elevated surface;+2 safety/equipment General transfer comment: Pad was not under thept well enought to use.  Dropped arm of recliner and had pt scoot to the chair.  Blocked pts feet/knees so that her feet would be planted and pt scooted in increments.  Pt needed cues to stop and  reposition her feet as they needed to be repositioned frequently for optimal position for scooting.  Mod assist and mod cues for safe technique as pt at times fearful and not following PT instruction.  Did instruct nursing that if they drop arm of the chair and make it level to bed and use the pad to scoot back keeping pts feet in good position, pt will do well getting back to bed.  Ambulation/Gait                 Stairs             Wheelchair Mobility    Modified Rankin (Stroke Patients Only)       Balance Overall balance assessment: Needs assistance Sitting-balance support: Bilateral upper extremity supported;Feet supported Sitting balance-Leahy Scale: Poor Sitting balance - Comments: relies on UE support to sit unsupported.  min guard to sit EOB                                    Cognition Arousal/Alertness: Awake/alert Behavior During Therapy: Restless Overall Cognitive Status: No family/caregiver present to determine baseline cognitive functioning                                 General Comments: Anxious and with slow processing.  Inconsistent following cues.      Exercises General Exercises - Lower Extremity Ankle Circles/Pumps:  Both;AAROM;5 reps;Supine Long Arc Quad: AROM;Right;15 reps;Seated Heel Slides: AAROM;Left;5 reps;Supine Hip Flexion/Marching: AROM;15 reps;Right;Seated    General Comments General comments (skin integrity, edema, etc.): VSS on RA      Pertinent Vitals/Pain Pain Assessment: Faces Faces Pain Scale: Hurts whole lot Pain Location: abdomen, left LE Pain Descriptors / Indicators: Guarding;Grimacing;Sore;Moaning Pain Intervention(s): Limited activity within patient's tolerance;Monitored during session;Repositioned    Home Living                      Prior Function            PT Goals (current goals can now be found in the care plan section) Acute Rehab PT Goals Patient Stated Goal: Get  better Progress towards PT goals: Progressing toward goals    Frequency    Min 3X/week      PT Plan Current plan remains appropriate    Co-evaluation              AM-PAC PT "6 Clicks" Mobility   Outcome Measure  Help needed turning from your back to your side while in a flat bed without using bedrails?: A Lot Help needed moving from lying on your back to sitting on the side of a flat bed without using bedrails?: A Lot Help needed moving to and from a bed to a chair (including a wheelchair)?: A Lot Help needed standing up from a chair using your arms (e.g., wheelchair or bedside chair)?: Total Help needed to walk in hospital room?: Total Help needed climbing 3-5 steps with a railing? : Total 6 Click Score: 9    End of Session Equipment Utilized During Treatment: Oxygen;Gait belt Activity Tolerance: Patient limited by fatigue;Patient limited by pain Patient left: with call bell/phone within reach;in chair;with chair alarm set Nurse Communication: Mobility status PT Visit Diagnosis: Muscle weakness (generalized) (M62.81);Pain;History of falling (Z91.81);Difficulty in walking, not elsewhere classified (R26.2) Pain - part of body:  (abdomen)     Time: 1340-1403 PT Time Calculation (min) (ACUTE ONLY): 23 min  Charges:  $Therapeutic Exercise: 8-22 mins $Therapeutic Activity: 8-22 mins                     Naveh Rickles M,PT Acute Rehab Services [phone] (pager)   [name] 02/17/2021, 4:14 PM

## 2021-02-18 DIAGNOSIS — M6282 Rhabdomyolysis: Secondary | ICD-10-CM | POA: Diagnosis not present

## 2021-02-18 DIAGNOSIS — M609 Myositis, unspecified: Secondary | ICD-10-CM | POA: Diagnosis not present

## 2021-02-18 DIAGNOSIS — N179 Acute kidney failure, unspecified: Secondary | ICD-10-CM | POA: Diagnosis not present

## 2021-02-18 LAB — CBC WITH DIFFERENTIAL/PLATELET
Abs Immature Granulocytes: 1.38 10*3/uL — ABNORMAL HIGH (ref 0.00–0.07)
Basophils Absolute: 0.1 10*3/uL (ref 0.0–0.1)
Basophils Relative: 1 %
Eosinophils Absolute: 0.2 10*3/uL (ref 0.0–0.5)
Eosinophils Relative: 1 %
HCT: 24.3 % — ABNORMAL LOW (ref 36.0–46.0)
Hemoglobin: 8.5 g/dL — ABNORMAL LOW (ref 12.0–15.0)
Immature Granulocytes: 9 %
Lymphocytes Relative: 32 %
Lymphs Abs: 4.8 10*3/uL — ABNORMAL HIGH (ref 0.7–4.0)
MCH: 30.2 pg (ref 26.0–34.0)
MCHC: 35 g/dL (ref 30.0–36.0)
MCV: 86.5 fL (ref 80.0–100.0)
Monocytes Absolute: 1 10*3/uL (ref 0.1–1.0)
Monocytes Relative: 7 %
Neutro Abs: 7.6 10*3/uL (ref 1.7–7.7)
Neutrophils Relative %: 50 %
Platelets: 231 10*3/uL (ref 150–400)
RBC: 2.81 MIL/uL — ABNORMAL LOW (ref 3.87–5.11)
RDW: 14.3 % (ref 11.5–15.5)
WBC: 15.1 10*3/uL — ABNORMAL HIGH (ref 4.0–10.5)
nRBC: 0 % (ref 0.0–0.2)

## 2021-02-18 LAB — COMPREHENSIVE METABOLIC PANEL
ALT: 86 U/L — ABNORMAL HIGH (ref 0–44)
AST: 103 U/L — ABNORMAL HIGH (ref 15–41)
Albumin: 1.8 g/dL — ABNORMAL LOW (ref 3.5–5.0)
Alkaline Phosphatase: 96 U/L (ref 38–126)
Anion gap: 10 (ref 5–15)
BUN: 60 mg/dL — ABNORMAL HIGH (ref 6–20)
CO2: 19 mmol/L — ABNORMAL LOW (ref 22–32)
Calcium: 7.4 mg/dL — ABNORMAL LOW (ref 8.9–10.3)
Chloride: 100 mmol/L (ref 98–111)
Creatinine, Ser: 6.83 mg/dL — ABNORMAL HIGH (ref 0.44–1.00)
GFR, Estimated: 7 mL/min — ABNORMAL LOW (ref >60)
Glucose, Bld: 122 mg/dL — ABNORMAL HIGH (ref 70–99)
Potassium: 4.1 mmol/L (ref 3.5–5.1)
Sodium: 129 mmol/L — ABNORMAL LOW (ref 135–145)
Total Bilirubin: 0.8 mg/dL (ref 0.3–1.2)
Total Protein: 5.2 g/dL — ABNORMAL LOW (ref 6.5–8.1)

## 2021-02-18 LAB — TYPE AND SCREEN
ABO/RH(D): O POS
Antibody Screen: NEGATIVE
Unit division: 0

## 2021-02-18 LAB — BPAM RBC
Blood Product Expiration Date: 202204012359
ISSUE DATE / TIME: 202203251027
Unit Type and Rh: 5100

## 2021-02-18 LAB — PHOSPHORUS: Phosphorus: 4.5 mg/dL (ref 2.5–4.6)

## 2021-02-18 LAB — HEPATITIS B SURFACE ANTIGEN: Hepatitis B Surface Ag: NONREACTIVE

## 2021-02-18 LAB — MAGNESIUM: Magnesium: 2.2 mg/dL (ref 1.7–2.4)

## 2021-02-18 NOTE — Progress Notes (Signed)
[facility] Progress Note  Subjective: pt seen and examined bedside prior to HD. Still has issues with pain in her legs and inability to nmove them around which is frustrating for her understandably. Otherwise no other complaints. --Was informed by HD staff just now that her catheter is not functioning well despite reversing lines. Flow rates are very low.  Seems to be more of a positional issue.  Will complete treatment.  Vitals:   02/18/21 1312 02/18/21 1315 02/18/21 1330 02/18/21 1400  BP: 117/74 113/72 110/61 126/64  Pulse: 92 70 76 61  Resp: '20 20 20 20  '$ Temp: 98.2 F (36.8 C)     TempSrc: Oral     SpO2: 95% 95%    Weight: 118.6 kg     Height:        Exam: Gen: nad, sitting up in bed CV: s1s2, rrr Resp: cta b/l Abd: soft Ext: tense edema bilateral LEs Neuro: awake, alert, speech clear and coherent Dialysis Access: Left IJ temp line    Assessment/ Plan: 1. AKI w/ CPK > 50,000 on admission. 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 No bacteremia or afib. SP CRRT 3/17- 3/23.  Unfortunately, she has not had any renal recovery.  We will provide intermittent HD.  Given catheter issues today, she may very well need to run on Monday again 2. Hypertension/volume: BP's good, b/l LE's edematous, UF as tolerated w/ HD  3. Rhabdomyolysis: was down for > 6 hrs, peak CPK > 50K on admit 3/17.  improved 4. Abd pain: RUQ US showing gallbladder sludge. Gen surgery evaluating. ^LFT's are likely muscle enzymes associated w/ her rhabdo incident. Does have signs of choleycystitis on imaging--mgmt per primary 5. Substance abuse: her husband is deceased following the incident.  Spiritual care involved and psychiatry consulted  6. Shock: resolved, off levophed, trop peak > 4000, TTE with EF 60-65% and no WMA, RV OK. Resolved and off pressors since 3/19.  7. Access: lij temp line. Completing treatment, will likely need an  exchange vs TDC. Will consult IR in the AM.   Gean Quint, MD [place] 02/18/2021, 3:10 PM      Recent Labs  Lab 02/17/21 0336 02/18/21 0102  K 3.9 4.1  BUN 49* 60*  CREATININE 5.58* 6.83*  CALCIUM 7.4* 7.4*  PHOS 4.0 4.5  HGB 6.6* 8.5*   Inpatient medications: . sodium chloride   Intravenous Once  . Chlorhexidine Gluconate Cloth  6 each Topical Daily  . famotidine  20 mg Oral Daily  . feeding supplement  237 mL Oral TID BM  . heparin  5,000 Units Subcutaneous Q8H  . mouth rinse  15 mL Mouth Rinse BID  . OLANZapine  5 mg Oral QHS  . sertraline  50 mg Oral Daily  . sodium chloride flush  10-40 mL Intracatheter Q12H  . traZODone  150 mg Oral QHS    ALPRAZolam, opium-belladonna, docusate, HYDROmorphone (DILAUDID) injection, ondansetron (ZOFRAN) IV, oxyCODONE, phenol, sodium chloride flush

## 2021-02-18 NOTE — Progress Notes (Signed)
Patient ID: Renee Wilkinson, female   DOB: 1971-05-18, 50 y.o.   MRN: [id number]   PROGRESS NOTE    Kaiyla France  [id number] DOB: 10-Sep-1971 DOA: 02/09/2021 PCP: Patient, No Pcp Per   Brief Narrative:  50 year old female with history of psychosis, cocaine use was found unresponsive and was admitted with hyperkalemia, renal failure, shock liver with imaging consistent with renal infarcts.  She was intubated and admitted to ICU and started on CRRT.  She required pressors.  She was found to have significant myositis.  There was a concern for compartment syndrome for which orthopedics was consulted who stated that she did not have compartment syndrome.  Right upper quadrant ultrasound was positive for Murphy sign but there was no gallbladder wall thickening but there were stones and sludge present.  She was started on IV antibiotics.  2D echo showed EF of 60 to 65%.  She was subsequently extubated.  She also developed small bowel obstruction for which general surgery was consulted and patient was managed conservatively with NG tube.  Neurology was also subsequently consulted for left leg weakness and numbness: MRI of thoracic and lumbar spine showed extensive myositis with no abscess.  HIDA scan showed delayed gallbladder wall feeling but was negative for acute cholecystitis.  Patient was switched to intermittent hemodialysis and was transferred to [facility].  She was transferred to [facility] service on 02/17/2021.  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 -Nephrology following: Status post CRRT from 02/09/2021-02/15/2021.  Transferred to [facility] for intermittent hemodialysis. -Dialysis as per nephrology schedule.  IV fluids discontinued on 02/17/2021.  Chronic cholecystitis Cholelithiasis with elevated LFTs -HIDA  scan and CT imaging negative for acute cholecystitis.  No plans for percutaneous drain placement or surgery at this time.  Zosyn discontinued on 02/17/2021 after discussion with general surgery -LFTs improving.  Monitor intermittently.  Partial small bowel obstruction secondary to intermittent incarceration of umbilical hernia -General surgery following.  Treated with NG tube and bowel rest.  NG tube has been removed.  Bowel function improving.   -Tolerating full liquid diet.  Will advance to soft diet today.  Left leg numbness/weakness L4-5 spinal stenosis and disc bulging Severe myositis of paraspinal musculature -Neurology evaluated the patient and has signed off. -PT recommended CIR placement.  CIR consultation pending.  Acute opiate overdose Acute grief Schizophrenia/depression -Psychiatry evaluated the patient during this hospitalization: Patient does not need inpatient psychiatric hospitalization.  Outpatient follow-up with psychiatry -Continue olanzapine/Zoloft/trazodone.  As needed Xanax.  Minimize sedation  Demand ischemia -Probably from narcotic overdose.  Echo looked okay.  No further work-up needed  Acute hypoxic respiratory failure -Status post intubation and extubation.  Resolved.  Currently on room air  Acute toxic encephalopathy -Probably from drug overdose.  Mental status has much improved.  Monitor.  Shock liver -LFTs improving.  Monitor intermittently  Shock: Present on admission; possibly from acute kidney injury and overdose -Sepsis was ruled out.  -Needed pressors initially which was subsequently discontinued. -Shock has resolved.  Hemodynamically stable currently.  Leukocytosis -Improving.  Monitor  Anemia of chronic disease -From renal failure.  Hemoglobin 8.5 this morning.  Status post 1 unit packed red cell transfusion on 02/17/2021 for hemoglobin of 6.6.  Thrombocytopenia -Resolved  Hyponatremia  -Nephrology following.  Currently being managed  by dialysis.    DVT prophylaxis: Heparin Code Status:  Full Family Communication: None at bedside Disposition Plan: Status is: Inpatient  Remains inpatient appropriate because:Inpatient level of care appropriate due to severity of illness   Dispo: The patient is from: Home              Anticipated d/c is to: CIR              Patient currently is medically stable to d/c.   Difficult to place patient No   Consultants: PCCM/psychiatry/nephrology/neurology/general surgery/orthopedics  Procedures: Intubation/extubation Right IJ hemodialysis catheter  Antimicrobials:  Anti-infectives (From admission, onward)   Start     Dose/Rate Route Frequency Ordered Stop   02/16/21 1000  piperacillin-tazobactam (ZOSYN) IVPB 2.25 g  Status:  Discontinued        2.25 g 100 mL/hr over 30 Minutes Intravenous Every 8 hours 02/16/21 0720 02/17/21 1047   02/13/21 1400  piperacillin-tazobactam (ZOSYN) IVPB 3.375 g  Status:  Discontinued        3.375 g 12.5 mL/hr over 240 Minutes Intravenous Every 8 hours 02/13/21 0826 02/16/21 0720   02/13/21 0815  piperacillin-tazobactam (ZOSYN) IVPB 3.375 g        3.375 g 100 mL/hr over 30 Minutes Intravenous  Once 02/13/21 0733 02/13/21 0820   02/09/21 1015  ceFEPIme (MAXIPIME) 2 g in sodium chloride 0.9 % 100 mL IVPB        2 g 200 mL/hr over 30 Minutes Intravenous  Once 02/09/21 1010 02/09/21 1112   02/09/21 1015  vancomycin (VANCOCIN) IVPB 1000 mg/200 mL premix        1,000 mg 200 mL/hr over 60 Minutes Intravenous  Once 02/09/21 1010 02/09/21 1901       Subjective: Patient seen and examined at bedside.  No overnight fever, vomiting, worsening abdominal pain reported.  Tolerating full liquid diet.  Had bowel movement this morning as well.  Objective: Vitals:   02/17/21 2000 02/17/21 2354 02/18/21 0426 02/18/21 0736  BP: (!) 142/75 (!) 143/81 (!) 130/58 135/75  Pulse: 99 99 99 (!) 103  Resp: '19 16 12 20  '$ Temp: 98.7 F (37.1 C) 98.8 F (37.1 C) 99.5  F (37.5 C) 98.9 F (37.2 C)  TempSrc: Oral Oral Oral Oral  SpO2: 96% 98% 92% 97%  Weight:   117.5 kg   Height:        Intake/Output Summary (Last 24 hours) at 02/18/2021 0758 Last data filed at 02/18/2021 0100 Gross per 24 hour  Intake 570.67 ml  Output 150 ml  Net 420.67 ml   Filed Weights   02/16/21 0454 02/17/21 0340 02/18/21 0426  Weight: 113 kg 117.7 kg 117.5 kg    Examination:  General exam: Chronically ill looking female.  Looks older than stated age.  No acute distress.  On room air currently.   Respiratory system: Decreased breath sounds at bases bilaterally with some scattered crackles  cardiovascular system: Intermittent tachycardia; S1-S2 positive Gastrointestinal system: Abdomen is slightly distended, soft and nontender.  Bowel sounds heard  extremities: Lower extremity edema present bilaterally; no clubbing Central nervous system: Awake and alert; poor historian.  Slow to respond.  No focal neurological deficits.  Moves extremities Skin: No obvious ecchymosis/rashes Psychiatry: Anxious looking intermittently.    Data Reviewed: I have personally reviewed following labs and imaging studies  CBC: Recent Labs  Lab 02/13/21 0559 02/14/21 0413 02/15/21 0458 02/15/21 1355 02/16/21 1011 02/17/21 0336 02/18/21 0102  WBC 22.6*   < > 13.9* 15.1* 13.1* 13.9* 15.1*  NEUTROABS 12.9*  --  6.9  --   --   --  7.6  HGB 7.9*   < > 6.3* 8.4* 7.4* 6.6* 8.5*  HCT 24.3*   < > 20.1* 26.3* 23.2* 20.7* 24.3*  MCV 90.0   < > 91.4 91.3 91.7 92.0 86.5  PLT 145*   < > 136* 163 158 181 231   < > = values in this interval not displayed.   Basic Metabolic Panel: Recent Labs  Lab 02/13/21 0559 02/13/21 1600 02/14/21 0413 02/14/21 1713 02/15/21 0458 02/16/21 0450 02/17/21 0336 02/18/21 0102  NA 132*  133*   < > 134* 135 135 134* 129* 129*  K 4.6  4.6   < > 4.6 4.4 4.4 4.1 3.9 4.1  CL 99  99   < > 102 102 103 103 100 100  CO2 24  24   < > '22 23 23 '$ 21* 20* 19*   GLUCOSE 116*  116*   < > 102* 98 94 94 109* 122*  BUN 34*  35*   < > 29* 31* 27* 38* 49* 60*  CREATININE 4.34*  4.22*   < > 3.26* 3.55* 2.76* 4.35* 5.58* 6.83*  CALCIUM 7.5*  7.5*   < > 7.5* 7.7* 7.4* 7.5* 7.4* 7.4*  MG 2.7*  --  2.5*  --  2.5*  --  2.3 2.2  PHOS 4.4   < > 3.2 3.8 2.4* 3.8 4.0 4.5   < > = values in this interval not displayed.   GFR: Estimated Creatinine Clearance: 12.6 mL/min (A) (by C-G formula based on SCr of 6.83 mg/dL (H)). Liver Function Tests: Recent Labs  Lab 02/12/21 0500 02/12/21 1320 02/13/21 0559 02/13/21 1600 02/14/21 0413 02/14/21 1713 02/15/21 0458 02/16/21 0450 02/17/21 0336 02/18/21 0102  AST 661*  --  528*  --  372*  --  228*  --   --  103*  ALT 307*  --  239*  --  187*  --  139*  --   --  86*  ALKPHOS 90  --  99  --  89  --  105  --   --  96  BILITOT 0.8  --  0.5  --  1.1  --  0.8  --   --  0.8  PROT 6.3*  --  6.1*  --  5.8*  --  5.2*  --   --  5.2*  ALBUMIN 2.3*  2.4*   < > 2.3*  2.3*   < > 2.2*  2.1* 2.1* 1.9*  1.9* 1.6* 1.6* 1.8*   < > = values in this interval not displayed.   No results for input(s): LIPASE, AMYLASE in the last 168 hours. No results for input(s): AMMONIA in the last 168 hours. Coagulation Profile: Recent Labs  Lab 02/12/21 1700  INR 1.0   Cardiac Enzymes: Recent Labs  Lab 02/15/21 0458  CKTOTAL 3,229*   BNP (last 3 results) No results for input(s): PROBNP in the last 8760 hours. HbA1C: No results for input(s): HGBA1C in the last 72 hours. CBG: Recent Labs  Lab 02/15/21 1239 02/16/21 2016  GLUCAP 83 151*   Lipid Profile: No results for input(s): CHOL, HDL, LDLCALC, TRIG, CHOLHDL, LDLDIRECT in the last 72 hours. Thyroid Function Tests: No results for input(s): TSH, T4TOTAL, FREET4, T3FREE, THYROIDAB in the last 72 hours. Anemia Panel: No results for input(s): VITAMINB12, FOLATE, FERRITIN, TIBC, IRON, RETICCTPCT in the last 72 hours. Sepsis Labs: No results for input(s): PROCALCITON,  LATICACIDVEN in the last 168 hours.  Recent Results (from the past 240  hour(s))  Culture, blood (single)     Status: None   Collection Time: 02/09/21 11:09 AM   Specimen: BLOOD  Result Value Ref Range Status   Specimen Description   Final    BLOOD LEFT ANTECUBITAL Performed at [facility], [place] [address] [place], [place] [address]    Special Requests   Final    BOTTLES DRAWN AEROBIC AND ANAEROBIC Blood Culture adequate volume Performed at [facility] [address] [place], [place] [address]    Culture   Final    NO GROWTH 5 DAYS Performed at [facility], [place] [address], [place], [place] [address]    Report Status 02/14/2021 FINAL  Final  Resp Panel by RT-PCR (Flu A&B, Covid) Nasopharyngeal Swab     Status: None   Collection Time: 02/09/21 12:08 PM   Specimen: Nasopharyngeal Swab; Nasopharyngeal(NP) swabs in vial transport medium  Result Value Ref Range Status   SARS Coronavirus 2 by RT PCR NEGATIVE NEGATIVE Final    Comment: (NOTE) SARS-CoV-2 target nucleic acids are NOT DETECTED.  The SARS-CoV-2 RNA is generally detectable in upper respiratory specimens during the acute phase of infection. The lowest concentration of SARS-CoV-2 viral copies this assay can detect is 138 copies/mL. A negative result does not preclude SARS-Cov-2 infection and should not be used as the sole basis for treatment or other patient management decisions. A negative result may occur with  improper specimen collection/handling, submission of specimen other than nasopharyngeal swab, presence of viral mutation(s) within the areas targeted by this assay, and inadequate number of viral copies(<138 copies/mL). A negative result must be combined with clinical observations, patient history, and epidemiological information. The expected result is Negative.  Fact Sheet for Patients:  EntrepreneurPulse.com.au  Fact Sheet for Healthcare Providers:   IncredibleEmployment.be  This test is no t yet approved or cleared by the [place] FDA and  has been authorized for detection and/or diagnosis of SARS-CoV-2 by FDA under an Emergency Use Authorization (EUA). This EUA will remain  in effect (meaning this test can be used) for the duration of the COVID-19 declaration under Section 564(b)(1) of the Act, 21 U.S.C.section 360bbb-3(b)(1), unless the authorization is terminated  or revoked sooner.       Influenza A by PCR NEGATIVE NEGATIVE Final   Influenza B by PCR NEGATIVE NEGATIVE Final    Comment: (NOTE) The Xpert Xpress SARS-CoV-2/FLU/RSV plus assay is intended as an aid in the diagnosis of influenza from Nasopharyngeal swab specimens and should not be used as a sole basis for treatment. Nasal washings and aspirates are unacceptable for Xpert Xpress SARS-CoV-2/FLU/RSV testing.  Fact Sheet for Patients: EntrepreneurPulse.com.au  Fact Sheet for Healthcare Providers: IncredibleEmployment.be  This test is not yet approved or cleared by the [place] FDA and has been authorized for detection and/or diagnosis of SARS-CoV-2 by FDA under an Emergency Use Authorization (EUA). This EUA will remain in effect (meaning this test can be used) for the duration of the COVID-19 declaration under Section 564(b)(1) of the Act, 21 U.S.C. section 360bbb-3(b)(1), unless the authorization is terminated or revoked.  Performed at [facility], [place] [address] [place], [place] [address]   MRSA PCR Screening     Status: Abnormal   Collection Time: 02/09/21  1:36 PM   Specimen: Nasopharyngeal  Result Value Ref Range Status   MRSA by PCR POSITIVE (A) NEGATIVE Final    Comment:        The GeneXpert MRSA Assay (  FDA approved for NASAL specimens only), is one component of a comprehensive MRSA colonization surveillance program. It is not intended to diagnose  MRSA infection nor to guide or monitor treatment for MRSA infections. RESULT CALLED TO, READ BACK BY AND VERIFIED WITH: T.OLEARY, RN AT 1653 ON 03.17.22 BY N.[email or web address] Performed at [facility] [address], [place], [place] [address]   MRSA PCR Screening     Status: Abnormal   Collection Time: 02/16/21  4:50 AM   Specimen: Nasopharyngeal  Result Value Ref Range Status   MRSA by PCR POSITIVE (A) NEGATIVE Final    Comment:        The GeneXpert MRSA Assay (FDA approved for NASAL specimens only), is one component of a comprehensive MRSA colonization surveillance program. It is not intended to diagnose MRSA infection nor to guide or monitor treatment for MRSA infections. RESULT CALLED TO, READ BACK BY AND VERIFIED WITH: RN K.FRPOUST AT 0830 ON 02/16/2021 BY T.SAAD Performed at [facility], [place] [address], [place], [place] [address]          Radiology Studies: VAS [place] LOWER EXTREMITY VENOUS (DVT)  Result Date: 02/16/2021  Lower Venous DVT Study Indications: Swelling, and Pain.  Comparison Study: No prior. Performing Technologist: Oda Cogan RDMS, RVT  Examination Guidelines: A complete evaluation includes B-mode imaging, spectral Doppler, color Doppler, and power Doppler as needed of all accessible portions of each vessel. Bilateral testing is considered an integral part of a complete examination. Limited examinations for reoccurring indications may be performed as noted. The reflux portion of the exam is performed with the patient in reverse Trendelenburg.  +---------+---------------+---------+-----------+----------+--------------+ LEFT     CompressibilityPhasicitySpontaneityPropertiesThrombus Aging +---------+---------------+---------+-----------+----------+--------------+ CFV      Full           Yes      Yes                                 +---------+---------------+---------+-----------+----------+--------------+ SFJ      Full                                                         +---------+---------------+---------+-----------+----------+--------------+ FV Prox  Full                                                        +---------+---------------+---------+-----------+----------+--------------+ FV Mid   Full                                                        +---------+---------------+---------+-----------+----------+--------------+ FV DistalFull                                                        +---------+---------------+---------+-----------+----------+--------------+ PFV      Full                                                        +---------+---------------+---------+-----------+----------+--------------+  POP      Full           Yes      Yes                                 +---------+---------------+---------+-----------+----------+--------------+ PTV      Full                                                        +---------+---------------+---------+-----------+----------+--------------+ PERO     Full                                                        +---------+---------------+---------+-----------+----------+--------------+     Summary: LEFT: - There is no evidence of deep vein thrombosis in the lower extremity.  - No cystic structure found in the popliteal fossa.  *See table(s) above for measurements and observations. Electronically signed by Monica Martinez MD on 02/16/2021 at 6:26:12 PM.    Final         Scheduled Meds: . sodium chloride   Intravenous Once  . Chlorhexidine Gluconate Cloth  6 each Topical Daily  . famotidine  20 mg Oral Daily  . feeding supplement  237 mL Oral TID BM  . heparin  5,000 Units Subcutaneous Q8H  . mouth rinse  15 mL Mouth Rinse BID  . OLANZapine  5 mg Oral QHS  . sertraline  50 mg Oral Daily  . sodium chloride flush  10-40 mL Intracatheter Q12H  . traZODone  150 mg Oral QHS   Continuous  Infusions:         Aline August, MD [facility] 02/18/2021, 7:58 AM

## 2021-02-18 NOTE — Plan of Care (Signed)
  Problem: Education: Goal: Knowledge of disease and its progression will improve Outcome: Progressing   Problem: Clinical Measurements: Goal: Complications related to the disease process or treatment will be avoided or minimized Outcome: Progressing Goal: Dialysis access will remain free of complications Outcome: Progressing   Problem: Activity: Goal: Activity intolerance will improve Outcome: Progressing   Problem: Pain Managment: Goal: General experience of comfort will improve Outcome: Progressing   Problem: Safety: Goal: Ability to remain free from injury will improve Outcome: Progressing

## 2021-02-18 NOTE — Progress Notes (Signed)
Inpatient Rehab Admissions Coordinator:   I spoke with Pt. to discuss potential CIR admission. Pt. Stated interest in CIR. Prior to admission, Pt.was living in an extended stay hotel. She states that she may be able to live with one of her adult children following discharge from CIR. She will need a home d/c plan in place before transfer to CIR. I will follow for potential admission pending insurance auth, confirmation of dispo, and bed availability. Please contact me with any questions.   Clemens Catholic, [place], [place] Admissions Coordinator  [phone] ([place]) [phone] (office)

## 2021-02-19 DIAGNOSIS — N179 Acute kidney failure, unspecified: Secondary | ICD-10-CM | POA: Diagnosis not present

## 2021-02-19 DIAGNOSIS — M6282 Rhabdomyolysis: Secondary | ICD-10-CM | POA: Diagnosis not present

## 2021-02-19 DIAGNOSIS — M609 Myositis, unspecified: Secondary | ICD-10-CM | POA: Diagnosis not present

## 2021-02-19 LAB — CBC WITH DIFFERENTIAL/PLATELET
Abs Immature Granulocytes: 0.97 10*3/uL — ABNORMAL HIGH (ref 0.00–0.07)
Basophils Absolute: 0.1 10*3/uL (ref 0.0–0.1)
Basophils Relative: 0 %
Eosinophils Absolute: 0.2 10*3/uL (ref 0.0–0.5)
Eosinophils Relative: 1 %
HCT: 21.3 % — ABNORMAL LOW (ref 36.0–46.0)
Hemoglobin: 7.2 g/dL — ABNORMAL LOW (ref 12.0–15.0)
Immature Granulocytes: 7 %
Lymphocytes Relative: 37 %
Lymphs Abs: 5.1 10*3/uL — ABNORMAL HIGH (ref 0.7–4.0)
MCH: 29.6 pg (ref 26.0–34.0)
MCHC: 33.8 g/dL (ref 30.0–36.0)
MCV: 87.7 fL (ref 80.0–100.0)
Monocytes Absolute: 1 10*3/uL (ref 0.1–1.0)
Monocytes Relative: 7 %
Neutro Abs: 6.8 10*3/uL (ref 1.7–7.7)
Neutrophils Relative %: 48 %
Platelets: 229 10*3/uL (ref 150–400)
RBC: 2.43 MIL/uL — ABNORMAL LOW (ref 3.87–5.11)
RDW: 14.6 % (ref 11.5–15.5)
WBC: 14.1 10*3/uL — ABNORMAL HIGH (ref 4.0–10.5)
nRBC: 0 % (ref 0.0–0.2)

## 2021-02-19 LAB — BASIC METABOLIC PANEL
Anion gap: 10 (ref 5–15)
BUN: 56 mg/dL — ABNORMAL HIGH (ref 6–20)
CO2: 21 mmol/L — ABNORMAL LOW (ref 22–32)
Calcium: 7.4 mg/dL — ABNORMAL LOW (ref 8.9–10.3)
Chloride: 100 mmol/L (ref 98–111)
Creatinine, Ser: 6.75 mg/dL — ABNORMAL HIGH (ref 0.44–1.00)
GFR, Estimated: 7 mL/min — ABNORMAL LOW (ref >60)
Glucose, Bld: 114 mg/dL — ABNORMAL HIGH (ref 70–99)
Potassium: 4.3 mmol/L (ref 3.5–5.1)
Sodium: 131 mmol/L — ABNORMAL LOW (ref 135–145)

## 2021-02-19 LAB — HEPATITIS B SURFACE ANTIBODY, QUANTITATIVE: Hep B S AB Quant (Post): <3.1 m[IU]/mL — ABNORMAL LOW (ref >9.9)

## 2021-02-19 MED ORDER — METHOCARBAMOL 500 MG PO TABS
500.0000 mg | ORAL_TABLET | Freq: Four times a day (QID) | ORAL | Status: DC | PRN
  Administered 2021-02-19: 500 mg via ORAL
  Filled 2021-02-19: qty 1

## 2021-02-19 MED ORDER — GABAPENTIN 100 MG PO CAPS
100.0000 mg | ORAL_CAPSULE | Freq: Two times a day (BID) | ORAL | Status: DC
  Administered 2021-02-19 – 2021-03-01 (×21): 100 mg via ORAL
  Filled 2021-02-19 (×22): qty 1

## 2021-02-19 NOTE — Plan of Care (Signed)
Alert and oriented x 4. C/o ongoing leg and right abdominal pain. Pain medication given with short lived relief. Needs assistance with toilet, bed mobility and transfers. Central line with no blood return per ICURN. Will continue to use PIV.No other concerns at this time.  Problem: Education: Goal: Knowledge of disease and its progression will improve Outcome: Progressing   Problem: Health Behavior/Discharge Planning: Goal: Ability to manage health-related needs will improve Outcome: Progressing   Problem: Clinical Measurements: Goal: Complications related to the disease process or treatment will be avoided or minimized Outcome: Progressing Goal: Dialysis access will remain free of complications Outcome: Progressing   Problem: Activity: Goal: Activity intolerance will improve Outcome: Progressing   Problem: Fluid Volume: Goal: Fluid volume balance will be maintained or improved Outcome: Progressing   Problem: Nutritional: Goal: Ability to make appropriate dietary choices will improve Outcome: Progressing   Problem: Respiratory: Goal: Respiratory symptoms related to disease process will be avoided Outcome: Progressing   Problem: Self-Concept: Goal: Body image disturbance will be avoided or minimized Outcome: Progressing   Problem: Urinary Elimination: Goal: Progression of disease will be identified and treated Outcome: Progressing   Problem: Education: Goal: Knowledge of General Education information will improve Description: Including pain rating scale, medication(s)/side effects and non-pharmacologic comfort measures Outcome: Progressing   Problem: Health Behavior/Discharge Planning: Goal: Ability to manage health-related needs will improve Outcome: Progressing   Problem: Clinical Measurements: Goal: Ability to maintain clinical measurements within normal limits will improve Outcome: Progressing Goal: Will remain free from infection Outcome: Progressing Goal:  Diagnostic test results will improve Outcome: Progressing Goal: Respiratory complications will improve Outcome: Progressing Goal: Cardiovascular complication will be avoided Outcome: Progressing   Problem: Activity: Goal: Risk for activity intolerance will decrease Outcome: Progressing   Problem: Nutrition: Goal: Adequate nutrition will be maintained Outcome: Progressing   Problem: Coping: Goal: Level of anxiety will decrease Outcome: Progressing   Problem: Elimination: Goal: Will not experience complications related to bowel motility Outcome: Progressing Goal: Will not experience complications related to urinary retention Outcome: Progressing   Problem: Pain Managment: Goal: General experience of comfort will improve Outcome: Progressing   Problem: Safety: Goal: Ability to remain free from injury will improve Outcome: Progressing   Problem: Skin Integrity: Goal: Risk for impaired skin integrity will decrease Outcome: Progressing

## 2021-02-19 NOTE — TOC Progression Note (Signed)
Transition of Care [facility]) - Progression Note    Patient Details  Name: Renee Wilkinson MRN: [id number] Date of Birth: 15-Sep-1971  Transition of Care [facility]) CM/SW Contact  Carles Collet, RN Phone Number: 02/19/2021, 11:58 AM  Clinical Narrative:    [name] w patient at bedside. She shares that she and her spouse moved from [place] to [place] in October 2021. They then came to [facility] in January 2022.  While in [place] she applied in [place] for [facility]. They told her that she had an outstanding, pending, disability case and the transfer of her Medicaid to [place] is in a "standstill." She later spoke w [facility] offices and they could not open another request to have it transferred to [place] since one was already open in [facility].   Her and her spouse were most recently living in an Extended Stay hotel in [place] while they were looking for an apartment to rent. They came to [facility] to be closer their daughter [name] who is 64, [redacted] weeks pregnant, lives in a home w her BF and is employed. [name] states that she also has a daughter [name] in [place], and a son [place] in [place].  [name] shares with me that her spouse died earlier this month of kidney failure, they were married 42 years.   [name] is currently on iHD, she anticipates to need chronic HD, in which case she would need clipped. This is causing her anxiety given her spouse's recent unexpected death.   She demonstrates that she cannot raise her R leg, which is also very swollen. She understands that PT is recommending 24 hour supervision. We discussed this may change if she is able to safely get out of bed and ambulate. [name] explains that her daughter [name] very much wants her to stay with her after DC. [name] agrees that would be the most logical plan, however she would prefer to be ambulatory before she goes to her daughter's house, as her daughter is pregnant and also works and unable to be there 24/7.  Therefore, she is agreeable to go to short term for physical therapy.   We discussed that her out of state Medicaid will be a barrier, but as her treatment plan for HD becomes more clear, we can work on her potentially getting to rehab.     Expected Discharge Plan: [place] Barriers to Discharge: Continued Medical Work up,Inadequate or no insurance,Waiting for outpatient dialysis  Expected Discharge Plan and Services Expected Discharge Plan: [place]   Discharge Planning Services: CM Consult   Living arrangements for the past 2 months: Hotel/Motel                                       Social Determinants of Health (SDOH) Interventions    Readmission Risk Interventions No flowsheet data found.

## 2021-02-19 NOTE — Progress Notes (Signed)
Patient ID: Renee Wilkinson, female   DOB: [birth year], 50 y.o.   MRN: [id number]   PROGRESS NOTE    Renee Wilkinson  [id number] DOB: Mar 29, 1971 DOA: 02/09/2021 PCP: Patient, No Pcp Per   Brief Narrative:  50 year old female with history of psychosis, cocaine use was found unresponsive and was admitted with hyperkalemia, renal failure, shock liver with imaging consistent with renal infarcts.  She was intubated and admitted to ICU and started on CRRT.  She required pressors.  She was found to have significant myositis.  There was a concern for compartment syndrome for which orthopedics was consulted who stated that she did not have compartment syndrome.  Right upper quadrant ultrasound was positive for Murphy sign but there was no gallbladder wall thickening but there were stones and sludge present.  She was started on IV antibiotics.  2D echo showed EF of 60 to 65%.  She was subsequently extubated.  She also developed small bowel obstruction for which general surgery was consulted and patient was managed conservatively with NG tube.  Neurology was also subsequently consulted for left leg weakness and numbness: MRI of thoracic and lumbar spine showed extensive myositis with no abscess.  HIDA scan showed delayed gallbladder wall feeling but was negative for acute cholecystitis.  Patient was switched to intermittent hemodialysis and was transferred to [facility].  She was transferred to [facility] service on 02/17/2021.  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 -Nephrology following: Status post CRRT from 02/09/2021-02/15/2021.  Transferred to [facility] for intermittent hemodialysis. -Dialysis as per nephrology schedule.  IV fluids discontinued on 02/17/2021.  Chronic cholecystitis Cholelithiasis with elevated LFTs -HIDA  scan and CT imaging negative for acute cholecystitis.  No plans for percutaneous drain placement or surgery at this time.  Zosyn discontinued on 02/17/2021 after discussion with general surgery -LFTs improving.  Monitor intermittently.  Partial small bowel obstruction secondary to intermittent incarceration of umbilical hernia -General surgery following.  Treated with NG tube and bowel rest.  NG tube has been removed.  Bowel function improving.   -Tolerating advanced diet.  Left leg numbness/weakness L4-5 spinal stenosis and disc bulging Severe myositis of paraspinal musculature -Neurology evaluated the patient and has signed off. -PT recommended CIR placement.  CIR following.  Acute opiate overdose Acute grief Schizophrenia/depression -Psychiatry evaluated the patient during this hospitalization: Patient does not need inpatient psychiatric hospitalization.  Outpatient follow-up with psychiatry -Continue olanzapine/Zoloft/trazodone.  As needed Xanax.  Minimize sedation  Demand ischemia -Probably from narcotic overdose.  Echo looked okay.  No further work-up needed  Acute hypoxic respiratory failure -Status post intubation and extubation.  Resolved.  Currently on room air  Acute toxic encephalopathy -Probably from drug overdose.  Mental status has much improved.  Monitor.  Shock liver -LFTs improving.  Monitor intermittently  Shock: Present on admission; possibly from acute kidney injury and overdose -Sepsis was ruled out.  -Needed pressors initially which was subsequently discontinued. -Shock has resolved.  Hemodynamically stable currently.  Leukocytosis -Improving.  Monitor  Anemia of chronic disease -From renal failure.  Hemoglobin 7.2 this morning.  Status post 1 unit packed red cell transfusion on 02/17/2021 for hemoglobin of 6.6.  Thrombocytopenia -Resolved  Hyponatremia  -Nephrology following.  Currently being managed by dialysis.    DVT prophylaxis:  Heparin Code Status: Full Family Communication: None at bedside Disposition Plan: Status  is: Inpatient  Remains inpatient appropriate because:Inpatient level of care appropriate due to severity of illness   Dispo: The patient is from: Home              Anticipated d/c is to: CIR              Patient currently is medically stable to d/c.   Difficult to place patient No   Consultants: PCCM/psychiatry/nephrology/neurology/general surgery/orthopedics  Procedures: Intubation/extubation Right IJ hemodialysis catheter  Antimicrobials:  Anti-infectives (From admission, onward)   Start     Dose/Rate Route Frequency Ordered Stop   02/16/21 1000  piperacillin-tazobactam (ZOSYN) IVPB 2.25 g  Status:  Discontinued        2.25 g 100 mL/hr over 30 Minutes Intravenous Every 8 hours 02/16/21 0720 02/17/21 1047   02/13/21 1400  piperacillin-tazobactam (ZOSYN) IVPB 3.375 g  Status:  Discontinued        3.375 g 12.5 mL/hr over 240 Minutes Intravenous Every 8 hours 02/13/21 0826 02/16/21 0720   02/13/21 0815  piperacillin-tazobactam (ZOSYN) IVPB 3.375 g        3.375 g 100 mL/hr over 30 Minutes Intravenous  Once 02/13/21 0733 02/13/21 0820   02/09/21 1015  ceFEPIme (MAXIPIME) 2 g in sodium chloride 0.9 % 100 mL IVPB        2 g 200 mL/hr over 30 Minutes Intravenous  Once 02/09/21 1010 02/09/21 1112   02/09/21 1015  vancomycin (VANCOCIN) IVPB 1000 mg/200 mL premix        1,000 mg 200 mL/hr over 60 Minutes Intravenous  Once 02/09/21 1010 02/09/21 1901       Subjective: Patient seen and examined at bedside.  Complains of lower extremity pain.  Tolerating diet.  No overnight fever, vomiting or worsening abdominal pain reported. Objective: Vitals:   02/19/21 0427 02/19/21 0500 02/19/21 0600 02/19/21 0722  BP: 121/61   104/63  Pulse: 98 100 97 100  Resp: 16 (!) 23 (!) 23 (!) 21  Temp: 99 F (37.2 C)   98.6 F (37 C)  TempSrc: Oral   Tympanic  SpO2: 96% 95% 97% 97%  Weight:  117.4 kg     Height:        Intake/Output Summary (Last 24 hours) at 02/19/2021 0829 Last data filed at 02/18/2021 1630 Gross per 24 hour  Intake -  Output 1664 ml  Net -1664 ml   Filed Weights   02/18/21 0426 02/18/21 1312 02/19/21 0500  Weight: 117.5 kg 118.6 kg 117.4 kg    Examination:  General exam: Chronically ill looking female.  Looks older than stated age.  On room air currently.  No distress.   Respiratory system: Bilateral decreased breath sounds at bases with some crackles cardiovascular system: S1-S2 positive, currently rate controlled  gastrointestinal system: Abdomen is still distended mildly, soft and nontender.  Bowel sounds are heard  extremities: No cyanosis; mild lower extremity edema present  Central nervous system: Poor historian.  Awake.  Still slow to respond.  No focal neurological deficits.  Moving extremities Skin: No obvious petechiae/lesions Psychiatry: Looks anxious intermittently.    Data Reviewed: I have personally reviewed following labs and imaging studies  CBC: Recent Labs  Lab 02/13/21 0559 02/14/21 0413 02/15/21 0458 02/15/21 1355 02/16/21 1011 02/17/21 0336 02/18/21 0102 02/19/21 0240  WBC 22.6*   < > 13.9* 15.1* 13.1* 13.9* 15.1* 14.1*  NEUTROABS 12.9*  --  6.9  --   --   --  7.6 6.8  HGB 7.9*   < >  6.3* 8.4* 7.4* 6.6* 8.5* 7.2*  HCT 24.3*   < > 20.1* 26.3* 23.2* 20.7* 24.3* 21.3*  MCV 90.0   < > 91.4 91.3 91.7 92.0 86.5 87.7  PLT 145*   < > 136* 163 158 181 231 229   < > = values in this interval not displayed.   Basic Metabolic Panel: Recent Labs  Lab 02/13/21 0559 02/13/21 1600 02/14/21 0413 02/14/21 1713 02/15/21 0458 02/16/21 0450 02/17/21 0336 02/18/21 0102 02/19/21 0240  NA 132*  133*   < > 134* 135 135 134* 129* 129* 131*  K 4.6  4.6   < > 4.6 4.4 4.4 4.1 3.9 4.1 4.3  CL 99  99   < > 102 102 103 103 100 100 100  CO2 24  24   < > '22 23 23 '$ 21* 20* 19* 21*  GLUCOSE 116*  116*   < > 102* 98 94 94 109* 122* 114*  BUN  34*  35*   < > 29* 31* 27* 38* 49* 60* 56*  CREATININE 4.34*  4.22*   < > 3.26* 3.55* 2.76* 4.35* 5.58* 6.83* 6.75*  CALCIUM 7.5*  7.5*   < > 7.5* 7.7* 7.4* 7.5* 7.4* 7.4* 7.4*  MG 2.7*  --  2.5*  --  2.5*  --  2.3 2.2  --   PHOS 4.4   < > 3.2 3.8 2.4* 3.8 4.0 4.5  --    < > = values in this interval not displayed.   GFR: Estimated Creatinine Clearance: 12.8 mL/min (A) (by C-G formula based on SCr of 6.75 mg/dL (H)). Liver Function Tests: Recent Labs  Lab 02/13/21 0559 02/13/21 1600 02/14/21 0413 02/14/21 1713 02/15/21 0458 02/16/21 0450 02/17/21 0336 02/18/21 0102  AST 528*  --  372*  --  228*  --   --  103*  ALT 239*  --  187*  --  139*  --   --  86*  ALKPHOS 99  --  89  --  105  --   --  96  BILITOT 0.5  --  1.1  --  0.8  --   --  0.8  PROT 6.1*  --  5.8*  --  5.2*  --   --  5.2*  ALBUMIN 2.3*  2.3*   < > 2.2*  2.1* 2.1* 1.9*  1.9* 1.6* 1.6* 1.8*   < > = values in this interval not displayed.   No results for input(s): LIPASE, AMYLASE in the last 168 hours. No results for input(s): AMMONIA in the last 168 hours. Coagulation Profile: Recent Labs  Lab 02/12/21 1700  INR 1.0   Cardiac Enzymes: Recent Labs  Lab 02/15/21 0458  CKTOTAL 3,229*   BNP (last 3 results) No results for input(s): PROBNP in the last 8760 hours. HbA1C: No results for input(s): HGBA1C in the last 72 hours. CBG: Recent Labs  Lab 02/15/21 1239 02/16/21 2016  GLUCAP 83 151*   Lipid Profile: No results for input(s): CHOL, HDL, LDLCALC, TRIG, CHOLHDL, LDLDIRECT in the last 72 hours. Thyroid Function Tests: No results for input(s): TSH, T4TOTAL, FREET4, T3FREE, THYROIDAB in the last 72 hours. Anemia Panel: No results for input(s): VITAMINB12, FOLATE, FERRITIN, TIBC, IRON, RETICCTPCT in the last 72 hours. Sepsis Labs: No results for input(s): PROCALCITON, LATICACIDVEN in the last 168 hours.  Recent Results (from the past 240 hour(s))  Culture, blood (single)     Status: None    Collection Time: 02/09/21 11:09 AM  Specimen: BLOOD  Result Value Ref Range Status   Specimen Description   Final    BLOOD LEFT ANTECUBITAL Performed at [facility], [place] [address] [place], [place] [address]    Special Requests   Final    BOTTLES DRAWN AEROBIC AND ANAEROBIC Blood Culture adequate volume Performed at [facility] [address], [place], [place] [address]    Culture   Final    NO GROWTH 5 DAYS Performed at [facility], [place] [address] [place], [place] [address]    Report Status 02/14/2021 FINAL  Final  Resp Panel by RT-PCR (Flu A&B, Covid) Nasopharyngeal Swab     Status: None   Collection Time: 02/09/21 12:08 PM   Specimen: Nasopharyngeal Swab; Nasopharyngeal(NP) swabs in vial transport medium  Result Value Ref Range Status   SARS Coronavirus 2 by RT PCR NEGATIVE NEGATIVE Final    Comment: (NOTE) SARS-CoV-2 target nucleic acids are NOT DETECTED.  The SARS-CoV-2 RNA is generally detectable in upper respiratory specimens during the acute phase of infection. The lowest concentration of SARS-CoV-2 viral copies this assay can detect is 138 copies/mL. A negative result does not preclude SARS-Cov-2 infection and should not be used as the sole basis for treatment or other patient management decisions. A negative result may occur with  improper specimen collection/handling, submission of specimen other than nasopharyngeal swab, presence of viral mutation(s) within the areas targeted by this assay, and inadequate number of viral copies(<138 copies/mL). A negative result must be combined with clinical observations, patient history, and epidemiological information. The expected result is Negative.  Fact Sheet for Patients:  EntrepreneurPulse.com.au  Fact Sheet for Healthcare Providers:  IncredibleEmployment.be  This test is no t yet approved or cleared by the [place] FDA and  has been  authorized for detection and/or diagnosis of SARS-CoV-2 by FDA under an Emergency Use Authorization (EUA). This EUA will remain  in effect (meaning this test can be used) for the duration of the COVID-19 declaration under Section 564(b)(1) of the Act, 21 U.S.C.section 360bbb-3(b)(1), unless the authorization is terminated  or revoked sooner.       Influenza A by PCR NEGATIVE NEGATIVE Final   Influenza B by PCR NEGATIVE NEGATIVE Final    Comment: (NOTE) The Xpert Xpress SARS-CoV-2/FLU/RSV plus assay is intended as an aid in the diagnosis of influenza from Nasopharyngeal swab specimens and should not be used as a sole basis for treatment. Nasal washings and aspirates are unacceptable for Xpert Xpress SARS-CoV-2/FLU/RSV testing.  Fact Sheet for Patients: EntrepreneurPulse.com.au  Fact Sheet for Healthcare Providers: IncredibleEmployment.be  This test is not yet approved or cleared by the [place] FDA and has been authorized for detection and/or diagnosis of SARS-CoV-2 by FDA under an Emergency Use Authorization (EUA). This EUA will remain in effect (meaning this test can be used) for the duration of the COVID-19 declaration under Section 564(b)(1) of the Act, 21 U.S.C. section 360bbb-3(b)(1), unless the authorization is terminated or revoked.  Performed at [facility], [place] [address] [place], [place] [address]   MRSA PCR Screening     Status: Abnormal   Collection Time: 02/09/21  1:36 PM   Specimen: Nasopharyngeal  Result Value Ref Range Status   MRSA by PCR POSITIVE (A) NEGATIVE Final    Comment:        The GeneXpert MRSA Assay (FDA approved for NASAL specimens only), is one component of a comprehensive MRSA colonization surveillance program. It is not intended  to diagnose MRSA infection nor to guide or monitor treatment for MRSA infections. RESULT CALLED TO, READ BACK BY AND VERIFIED WITH: T.OLEARY, RN AT  1653 ON 03.17.22 BY N.THOMPSON Performed at [facility] [address], [place], [place] [address]   MRSA PCR Screening     Status: Abnormal   Collection Time: 02/16/21  4:50 AM   Specimen: Nasopharyngeal  Result Value Ref Range Status   MRSA by PCR POSITIVE (A) NEGATIVE Final    Comment:        The GeneXpert MRSA Assay (FDA approved for NASAL specimens only), is one component of a comprehensive MRSA colonization surveillance program. It is not intended to diagnose MRSA infection nor to guide or monitor treatment for MRSA infections. RESULT CALLED TO, READ BACK BY AND VERIFIED WITH: RN K.FRPOUST AT 0830 ON 02/16/2021 BY T.SAAD Performed at [facility], [place] [address] [place], [place] [address]          Radiology Studies: No results found.      Scheduled Meds: . sodium chloride   Intravenous Once  . Chlorhexidine Gluconate Cloth  6 each Topical Daily  . famotidine  20 mg Oral Daily  . feeding supplement  237 mL Oral TID BM  . heparin  5,000 Units Subcutaneous Q8H  . mouth rinse  15 mL Mouth Rinse BID  . OLANZapine  5 mg Oral QHS  . sertraline  50 mg Oral Daily  . sodium chloride flush  10-40 mL Intracatheter Q12H  . traZODone  150 mg Oral QHS   Continuous Infusions:         Aline August, MD [facility] 02/19/2021, 8:29 AM

## 2021-02-19 NOTE — Progress Notes (Signed)
[facility] Progress Note  Subjective: pt seen and examined bedside this am. Issues with access yesterday, mainly positional. Low flow rates throughout treatment. Net UF 1664. Overall she tolerated HD yesterday Her biggest complaint is ongoing numbness on the left side of her body and the inability to move her left leg especially due to the swelling. She does report a lot of pain in that leg which also has been an ongoing issue. She otherwise denies any fevers, chills, chest pain, SOB.  Vitals:   02/19/21 0500 02/19/21 0600 02/19/21 0722 02/19/21 1139  BP:   104/63 (!) 100/58  Pulse: 100 97 100 99  Resp: (!) 23 (!) 23 (!) 21 16  Temp:   98.6 F (37 C) 98.7 F (37.1 C)  TempSrc:   Tympanic Oral  SpO2: 95% 97% 97% 98%  Weight: 117.4 kg     Height:        Exam: Gen: nad, sitting up in bed CV: s1s2, rrr Resp: cta b/l Abd: soft Ext: tense edema bilateral LEs Neuro: awake, alert, speech clear and coherent Dialysis Access: Left IJ temp line    Assessment/ Plan: 1. AKI w/ CPK > 50,000 on admission. 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 She did have contrast on 3/17 so this may have hindered any chances of renal recovery thereafter (although benefits outweighed risks at the time of contrast administration). She is S/P CRRT 3/17- 3/23.  Unfortunately, she has not had any renal recovery and given her radiographic findings and clinical course, I am suspecting there is a low chance of recovery (but still monitoring for any signs of recovery). At they very least, I am suspecting that she will need renal replacement therapy for at least the immediate foreseeable future.   1. Will consult IR for tunneled dialysis catheter placement given catheter issues and the aforementioned plan/thoughts 2. Plan for HD again tomorrow. Will try for more UF as tolerated. 2. Hypertension/volume: BP's good, b/l LE's edematous, UF  as tolerated w/ HD  3. Rhabdomyolysis: was down for > 6 hrs, peak CPK > 50K on admit 3/17.  Improved 4. Hyponatremia, hypervolemic: managing with HD, uf as tolerated 5. Abd pain: RUQ US showing gallbladder sludge. Gen surgery evaluated. ^LFT's are likely muscle enzymes associated w/ her rhabdo incident. Does have signs of choleycystitis on imaging--mgmt per primary 6. Substance abuse: her husband is deceased following the incident.  Spiritual care involved and psychiatry consulted  7. Shock: resolved, off levophed, trop peak > 4000, TTE with EF 60-65% and no WMA, RV OK. Resolved and off pressors since 3/19.  8. Access: lij temp line. Will need TDC. Consult placed for IR   Gean Quint, MD [place] 02/19/2021, 1:46 PM      Recent Labs  Lab 02/17/21 0336 02/18/21 0102 02/19/21 0240  K 3.9 4.1 4.3  BUN 49* 60* 56*  CREATININE 5.58* 6.83* 6.75*  CALCIUM 7.4* 7.4* 7.4*  PHOS 4.0 4.5  --   HGB 6.6* 8.5* 7.2*   Inpatient medications: . sodium chloride   Intravenous Once  . Chlorhexidine Gluconate Cloth  6 each Topical Daily  . famotidine  20 mg Oral Daily  . feeding supplement  237 mL Oral TID BM  . heparin  5,000 Units Subcutaneous Q8H  . mouth rinse  15 mL Mouth Rinse BID  . OLANZapine  5 mg Oral QHS  . sertraline  50 mg Oral Daily  . sodium chloride  flush  10-40 mL Intracatheter Q12H  . traZODone  150 mg Oral QHS    ALPRAZolam, opium-belladonna, docusate, HYDROmorphone (DILAUDID) injection, ondansetron (ZOFRAN) IV, oxyCODONE, phenol, sodium chloride flush

## 2021-02-20 DIAGNOSIS — N179 Acute kidney failure, unspecified: Secondary | ICD-10-CM | POA: Diagnosis not present

## 2021-02-20 DIAGNOSIS — M6282 Rhabdomyolysis: Secondary | ICD-10-CM | POA: Diagnosis not present

## 2021-02-20 DIAGNOSIS — K56609 Unspecified intestinal obstruction, unspecified as to partial versus complete obstruction: Secondary | ICD-10-CM | POA: Diagnosis not present

## 2021-02-20 DIAGNOSIS — M609 Myositis, unspecified: Secondary | ICD-10-CM | POA: Diagnosis not present

## 2021-02-20 LAB — BASIC METABOLIC PANEL
Anion gap: 10 (ref 5–15)
BUN: 70 mg/dL — ABNORMAL HIGH (ref 6–20)
CO2: 21 mmol/L — ABNORMAL LOW (ref 22–32)
Calcium: 7.6 mg/dL — ABNORMAL LOW (ref 8.9–10.3)
Chloride: 99 mmol/L (ref 98–111)
Creatinine, Ser: 8.24 mg/dL — ABNORMAL HIGH (ref 0.44–1.00)
GFR, Estimated: 5 mL/min — ABNORMAL LOW (ref >60)
Glucose, Bld: 96 mg/dL (ref 70–99)
Potassium: 4.8 mmol/L (ref 3.5–5.1)
Sodium: 130 mmol/L — ABNORMAL LOW (ref 135–145)

## 2021-02-20 LAB — CBC WITH DIFFERENTIAL/PLATELET
Abs Immature Granulocytes: 0.69 10*3/uL — ABNORMAL HIGH (ref 0.00–0.07)
Basophils Absolute: 0.1 10*3/uL (ref 0.0–0.1)
Basophils Relative: 1 %
Eosinophils Absolute: 0.2 10*3/uL (ref 0.0–0.5)
Eosinophils Relative: 2 %
HCT: 21.2 % — ABNORMAL LOW (ref 36.0–46.0)
Hemoglobin: 7.2 g/dL — ABNORMAL LOW (ref 12.0–15.0)
Immature Granulocytes: 5 %
Lymphocytes Relative: 38 %
Lymphs Abs: 5.1 10*3/uL — ABNORMAL HIGH (ref 0.7–4.0)
MCH: 30 pg (ref 26.0–34.0)
MCHC: 34 g/dL (ref 30.0–36.0)
MCV: 88.3 fL (ref 80.0–100.0)
Monocytes Absolute: 1.1 10*3/uL — ABNORMAL HIGH (ref 0.1–1.0)
Monocytes Relative: 8 %
Neutro Abs: 6.4 10*3/uL (ref 1.7–7.7)
Neutrophils Relative %: 46 %
Platelets: 284 10*3/uL (ref 150–400)
RBC: 2.4 MIL/uL — ABNORMAL LOW (ref 3.87–5.11)
RDW: 14.6 % (ref 11.5–15.5)
WBC: 13.5 10*3/uL — ABNORMAL HIGH (ref 4.0–10.5)
nRBC: 0 % (ref 0.0–0.2)

## 2021-02-20 MED ORDER — TORSEMIDE 20 MG PO TABS
100.0000 mg | ORAL_TABLET | Freq: Every day | ORAL | Status: DC
  Administered 2021-02-21 – 2021-02-26 (×6): 100 mg via ORAL
  Filled 2021-02-20 (×6): qty 5

## 2021-02-20 MED ORDER — METHOCARBAMOL 750 MG PO TABS
750.0000 mg | ORAL_TABLET | Freq: Four times a day (QID) | ORAL | Status: DC | PRN
  Administered 2021-02-20 – 2021-03-07 (×24): 750 mg via ORAL
  Filled 2021-02-20 (×25): qty 1

## 2021-02-20 MED ORDER — HALOPERIDOL LACTATE 5 MG/ML IJ SOLN
1.0000 mg | Freq: Four times a day (QID) | INTRAMUSCULAR | Status: DC | PRN
Start: 2021-02-20 — End: 2021-03-07

## 2021-02-20 MED ORDER — OLANZAPINE 5 MG PO TABS
7.5000 mg | ORAL_TABLET | Freq: Every day | ORAL | Status: DC
  Administered 2021-02-20: 7.5 mg via ORAL
  Filled 2021-02-20: qty 1

## 2021-02-20 MED ORDER — OXYCODONE HCL 5 MG PO TABS
5.0000 mg | ORAL_TABLET | ORAL | Status: DC | PRN
  Administered 2021-02-20 – 2021-02-22 (×8): 10 mg via ORAL
  Administered 2021-02-22: 5 mg via ORAL
  Administered 2021-02-23 – 2021-02-28 (×21): 10 mg via ORAL
  Filled 2021-02-20 (×31): qty 2

## 2021-02-20 MED ORDER — OLANZAPINE 5 MG PO TBDP
2.5000 mg | ORAL_TABLET | Freq: Once | ORAL | Status: AC
  Administered 2021-02-20: 2.5 mg via ORAL
  Filled 2021-02-20: qty 0.5

## 2021-02-20 MED ORDER — HYDROMORPHONE HCL 1 MG/ML IJ SOLN
0.5000 mg | INTRAMUSCULAR | Status: DC | PRN
  Administered 2021-02-20 – 2021-03-06 (×45): 0.5 mg via INTRAVENOUS
  Filled 2021-02-20 (×44): qty 0.5

## 2021-02-20 MED ORDER — FUROSEMIDE 10 MG/ML IJ SOLN: 120.0000 mg | Freq: Once | INTRAVENOUS | Status: DC

## 2021-02-20 NOTE — Progress Notes (Signed)
Patient ID: Renee Wilkinson, female   DOB: August 27, 1971, 50 y.o.   MRN: [id number]   PROGRESS NOTE    Renee Wilkinson  [id number] DOB: 22-Sep-1971 DOA: 02/09/2021 PCP: Patient, No Pcp Per   Brief Narrative:  50 year old female with history of psychosis, cocaine use was found unresponsive and was admitted with hyperkalemia, renal failure, shock liver with imaging consistent with renal infarcts.  She was intubated and admitted to ICU and started on CRRT.  She required pressors.  She was found to have significant myositis.  There was a concern for compartment syndrome for which orthopedics was consulted who stated that she did not have compartment syndrome.  Right upper quadrant ultrasound was positive for Murphy sign but there was no gallbladder wall thickening but there were stones and sludge present.  She was started on IV antibiotics.  2D echo showed EF of 60 to 65%.  She was subsequently extubated.  She also developed small bowel obstruction for which general surgery was consulted and patient was managed conservatively with NG tube.  Neurology was also subsequently consulted for left leg weakness and numbness: MRI of thoracic and lumbar spine showed extensive myositis with no abscess.  HIDA scan showed delayed gallbladder wall feeling but was negative for acute cholecystitis.  Patient was switched to intermittent hemodialysis and was transferred to [facility].  She was transferred to [facility] service on 02/17/2021.  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 -Nephrology following: Status post CRRT from 02/09/2021-02/15/2021.  Transferred to [facility] for intermittent hemodialysis. -Dialysis as per nephrology schedule.  IV fluids discontinued on 02/17/2021.  Chronic cholecystitis Cholelithiasis with elevated LFTs -HIDA  scan and CT imaging negative for acute cholecystitis.  No plans for percutaneous drain placement or surgery at this time.  Zosyn discontinued on 02/17/2021 after discussion with general surgery -LFTs improving.  Monitor intermittently.  Partial small bowel obstruction secondary to intermittent incarceration of umbilical hernia -General surgery following.  Treated with NG tube and bowel rest.  NG tube has been removed.  Bowel function improving.   -Tolerating advanced diet.  Left leg numbness/weakness L4-5 spinal stenosis and disc bulging Severe myositis of paraspinal musculature -Neurology evaluated the patient and has signed off. -PT recommended CIR placement.  CIR following.  Acute opiate overdose Acute grief Schizophrenia/depression -Psychiatry evaluated the patient during this hospitalization: Patient does not need inpatient psychiatric hospitalization.  Outpatient follow-up with psychiatry -Continue olanzapine/Zoloft/trazodone.  As needed Xanax.  Minimize sedation  Demand ischemia -Probably from narcotic overdose.  Echo looked okay.  No further work-up needed  Acute hypoxic respiratory failure -Status post intubation and extubation.  Resolved.  Currently on room air  Acute toxic encephalopathy -Probably from drug overdose.  Mental status has much improved.  Monitor.  Shock liver -LFTs improving.  Monitor intermittently  Shock: Present on admission; possibly from acute kidney injury and overdose -Sepsis was ruled out.  -Needed pressors initially which was subsequently discontinued. -Shock has resolved.  Hemodynamically stable currently.  Leukocytosis -Improving.  Monitor intermittently  Anemia of chronic disease -From renal failure.  Hemoglobin 7.2 on 02/19/2021.  Status post 1 unit packed red cell transfusion on 02/17/2021 for hemoglobin of 6.6.  Thrombocytopenia -Resolved  Hyponatremia  -Nephrology following.  Currently being managed by dialysis.    DVT  prophylaxis: Heparin Code Status: Full Family Communication: None at bedside Disposition Plan:  Status is: Inpatient  Remains inpatient appropriate because:Inpatient level of care appropriate due to severity of illness   Dispo: The patient is from: Home              Anticipated d/c is to: CIR              Patient currently is medically stable to d/c.   Difficult to place patient No   Consultants: PCCM/psychiatry/nephrology/neurology/general surgery/orthopedics  Procedures: Intubation/extubation Right IJ hemodialysis catheter  Antimicrobials:  Anti-infectives (From admission, onward)   Start     Dose/Rate Route Frequency Ordered Stop   02/16/21 1000  piperacillin-tazobactam (ZOSYN) IVPB 2.25 g  Status:  Discontinued        2.25 g 100 mL/hr over 30 Minutes Intravenous Every 8 hours 02/16/21 0720 02/17/21 1047   02/13/21 1400  piperacillin-tazobactam (ZOSYN) IVPB 3.375 g  Status:  Discontinued        3.375 g 12.5 mL/hr over 240 Minutes Intravenous Every 8 hours 02/13/21 0826 02/16/21 0720   02/13/21 0815  piperacillin-tazobactam (ZOSYN) IVPB 3.375 g        3.375 g 100 mL/hr over 30 Minutes Intravenous  Once 02/13/21 0733 02/13/21 0820   02/09/21 1015  ceFEPIme (MAXIPIME) 2 g in sodium chloride 0.9 % 100 mL IVPB        2 g 200 mL/hr over 30 Minutes Intravenous  Once 02/09/21 1010 02/09/21 1112   02/09/21 1015  vancomycin (VANCOCIN) IVPB 1000 mg/200 mL premix        1,000 mg 200 mL/hr over 60 Minutes Intravenous  Once 02/09/21 1010 02/09/21 1901       Subjective: Patient seen and examined at bedside.  No overnight fever, vomiting, abdominal pain or diarrhea reported.  Still complaining of bilateral lower extremity pain and difficulty moving left lower extremity. Objective: Vitals:   02/19/21 1601 02/19/21 2026 02/20/21 0024 02/20/21 0409  BP: 113/61 135/64 (!) 108/56 130/77  Pulse: 90 97 96 95  Resp: 20 (!) '25 20 19  '$ Temp: 98.9 F (37.2 C) 98.2 F (36.8 C) 99.3 F (37.4  C) 98.7 F (37.1 C)  TempSrc: Oral Oral Oral Oral  SpO2: 94% 99% 94% 97%  Weight:      Height:        Intake/Output Summary (Last 24 hours) at 02/20/2021 0738 Last data filed at 02/19/2021 1800 Gross per 24 hour  Intake 687 ml  Output 500 ml  Net 187 ml   Filed Weights   02/18/21 0426 02/18/21 1312 02/19/21 0500  Weight: 117.5 kg 118.6 kg 117.4 kg    Examination:  General exam: Chronically ill looking female.  Looks older than stated age.  No acute distress.  Currently on room air.   Respiratory system: Decreased breath sounds at bases bilaterally with scattered crackles  cardiovascular system: Rate controlled, S1-S2 positive gastrointestinal system: Abdomen is slightly distended, soft and nontender.  Normal bowel sounds heard  extremities: Lower extremity edema present; no clubbing Central nervous system: Alert and awake.  No obvious focal neurological deficits.  Skin: No obvious ecchymosis/rashes Psychiatry: Flat affect    Data Reviewed: I have personally reviewed following labs and imaging studies  CBC: Recent Labs  Lab 02/15/21 0458 02/15/21 1355 02/16/21 1011 02/17/21 0336 02/18/21 0102 02/19/21 0240  WBC 13.9* 15.1* 13.1* 13.9* 15.1* 14.1*  NEUTROABS 6.9  --   --   --  7.6 6.8  HGB 6.3* 8.4* 7.4* 6.6* 8.5* 7.2*  HCT 20.1* 26.3* 23.2* 20.7* 24.3* 21.3*  MCV  91.4 91.3 91.7 92.0 86.5 87.7  PLT 136* 163 158 181 231 [id number]   Basic Metabolic Panel: Recent Labs  Lab 02/14/21 0413 02/14/21 1713 02/15/21 0458 02/16/21 0450 02/17/21 0336 02/18/21 0102 02/19/21 0240  NA 134* 135 135 134* 129* 129* 131*  K 4.6 4.4 4.4 4.1 3.9 4.1 4.3  CL 102 102 103 103 100 100 100  CO2 '22 23 23 '$ 21* 20* 19* 21*  GLUCOSE 102* 98 94 94 109* 122* 114*  BUN 29* 31* 27* 38* 49* 60* 56*  CREATININE 3.26* 3.55* 2.76* 4.35* 5.58* 6.83* 6.75*  CALCIUM 7.5* 7.7* 7.4* 7.5* 7.4* 7.4* 7.4*  MG 2.5*  --  2.5*  --  2.3 2.2  --   PHOS 3.2 3.8 2.4* 3.8 4.0 4.5  --    GFR: Estimated  Creatinine Clearance: 12.8 mL/min (A) (by C-G formula based on SCr of 6.75 mg/dL (H)). Liver Function Tests: Recent Labs  Lab 02/14/21 0413 02/14/21 1713 02/15/21 0458 02/16/21 0450 02/17/21 0336 02/18/21 0102  AST 372*  --  228*  --   --  103*  ALT 187*  --  139*  --   --  86*  ALKPHOS 89  --  105  --   --  96  BILITOT 1.1  --  0.8  --   --  0.8  PROT 5.8*  --  5.2*  --   --  5.2*  ALBUMIN 2.2*  2.1* 2.1* 1.9*  1.9* 1.6* 1.6* 1.8*   No results for input(s): LIPASE, AMYLASE in the last 168 hours. No results for input(s): AMMONIA in the last 168 hours. Coagulation Profile: No results for input(s): INR, PROTIME in the last 168 hours. Cardiac Enzymes: Recent Labs  Lab 02/15/21 0458  CKTOTAL 3,229*   BNP (last 3 results) No results for input(s): PROBNP in the last 8760 hours. HbA1C: No results for input(s): HGBA1C in the last 72 hours. CBG: Recent Labs  Lab 02/15/21 1239 02/16/21 2016  GLUCAP 83 151*   Lipid Profile: No results for input(s): CHOL, HDL, LDLCALC, TRIG, CHOLHDL, LDLDIRECT in the last 72 hours. Thyroid Function Tests: No results for input(s): TSH, T4TOTAL, FREET4, T3FREE, THYROIDAB in the last 72 hours. Anemia Panel: No results for input(s): VITAMINB12, FOLATE, FERRITIN, TIBC, IRON, RETICCTPCT in the last 72 hours. Sepsis Labs: No results for input(s): PROCALCITON, LATICACIDVEN in the last 168 hours.  Recent Results (from the past 240 hour(s))  MRSA PCR Screening     Status: Abnormal   Collection Time: 02/16/21  4:50 AM   Specimen: Nasopharyngeal  Result Value Ref Range Status   MRSA by PCR POSITIVE (A) NEGATIVE Final    Comment:        The GeneXpert MRSA Assay (FDA approved for NASAL specimens only), is one component of a comprehensive MRSA colonization surveillance program. It is not intended to diagnose MRSA infection nor to guide or monitor treatment for MRSA infections. RESULT CALLED TO, READ BACK BY AND VERIFIED WITH: RN K.FRPOUST AT  0830 ON 02/16/2021 BY T.SAAD Performed at [facility], [place] [address], [place], [place] [address]          Radiology Studies: No results found.      Scheduled Meds: . sodium chloride   Intravenous Once  . Chlorhexidine Gluconate Cloth  6 each Topical Daily  . famotidine  20 mg Oral Daily  . feeding supplement  237 mL Oral TID BM  . gabapentin  100 mg Oral BID  . heparin  5,000 Units Subcutaneous Q8H  . mouth rinse  15 mL Mouth Rinse BID  . OLANZapine  5 mg Oral QHS  . sertraline  50 mg Oral Daily  . sodium chloride flush  10-40 mL Intracatheter Q12H  . traZODone  150 mg Oral QHS   Continuous Infusions:         Aline August, MD [facility] 02/20/2021, 7:38 AM

## 2021-02-20 NOTE — Progress Notes (Signed)
Wells Guiles, RN called and notified the pt's HD tx has been moved to 02/21/2021.

## 2021-02-20 NOTE — Progress Notes (Signed)
Physical Therapy Treatment Patient Details Name: Renee Wilkinson MRN: [id number] DOB: 12/28/1970 Today's Date: 02/20/2021    History of Present Illness Pt is a 50 yo female, [facility]/17 after being found down (next to deceased spouse) after smoking crack cocaine and now with AKI, hyperkalemia, rhabdomyolysis, and leukocytosis.  Pt with hx of substance abuse.    PT Comments    Per RN, pt with new onset hallucinations and significant anxiety related to this, but pt agreeable to mobility with min encouragement. Pt progressing well with mobility today, tolerating transfer to stand followed by pivotal steps to reach recliner. Pt very fatigued after transfer, but is encouraged by her progress at this time. Pt with very painful posterior aspect of LLE, moreso during bed mobility and repositioning vs in WB. Pt up in chair with sitter present at PT exit.  Of note, PT concerned about the appearance of pt's L IJ access, as tape is coming off and pt keeps reaching towards it/scratching it even with cuing to stop. RN notified, RN states she will call IV team.   Follow Up Recommendations  CIR     Equipment Recommendations  None recommended by PT    Recommendations for Other Services OT consult     Precautions / Restrictions Precautions Precautions: Fall Restrictions Weight Bearing Restrictions: No    Mobility  Bed Mobility Overal bed mobility: Needs Assistance Bed Mobility: Supine to Sit     Supine to sit: HOB elevated;Mod assist     General bed mobility comments: Mod assist for trunk elevation, LLE translation to EOB, scooting to EOB. Verbal cuing for sequencing task, increased time and effort to perform.    Transfers Overall transfer level: Needs assistance Equipment used: Rolling walker (2 wheeled) Transfers: Sit to/from [facility] Sit to Stand: Mod assist;+2 physical assistance;From elevated surface Stand pivot transfers: Mod assist;+2 physical  assistance       General transfer comment: Mod +2 for rise, forward hip translation (from flexion>neutral extension), and steadying upon standing. Pt performed pre-gait tasks of weight shifting L and R, standing marches bilaterally, and then transferred to recliner via pivotal steps towards R, assist for steadying, slow eccentric lowering into chair.  Ambulation/Gait             General Gait Details: pivotal steps only during transfer   Stairs             Wheelchair Mobility    Modified Rankin (Stroke Patients Only)       Balance Overall balance assessment: Needs assistance Sitting-balance support: Bilateral upper extremity supported;Feet supported Sitting balance-Leahy Scale: Poor Sitting balance - Comments: heavy reliance on UEs when sitting EOB Postural control: Posterior lean Standing balance support: Bilateral upper extremity supported;During functional activity Standing balance-Leahy Scale: Poor Standing balance comment: reliant on external assist                            Cognition Arousal/Alertness: Awake/alert Behavior During Therapy: Restless Overall Cognitive Status: No family/caregiver present to determine baseline cognitive functioning                                 General Comments: Pt with new onset hallucinations, stating during session "I see all these bugs crawling in my bed, I know they aren't there, I'm psychotic". Pt follows all commands well during session, at times is resistant to mobility secondary  to anxiety about LLE giving out or being exceptionally painful.      Exercises      General Comments        Pertinent Vitals/Pain Pain Assessment: Faces Faces Pain Scale: Hurts even more Pain Location: LLE, posterior aspect Pain Descriptors / Indicators: Grimacing;Guarding;Sore;Spasm;Other (Comment) ("feels like a charley horse") Pain Intervention(s): Limited activity within patient's tolerance;Monitored  during session;Repositioned    Home Living                      Prior Function            PT Goals (current goals can now be found in the care plan section) Acute Rehab PT Goals Patient Stated Goal: Get better PT Goal Formulation: With patient Time For Goal Achievement: 02/26/21 Potential to Achieve Goals: Good Progress towards PT goals: Progressing toward goals    Frequency    Min 3X/week      PT Plan Current plan remains appropriate    Co-evaluation              AM-PAC PT "6 Clicks" Mobility   Outcome Measure  Help needed turning from your back to your side while in a flat bed without using bedrails?: A Lot Help needed moving from lying on your back to sitting on the side of a flat bed without using bedrails?: A Lot Help needed moving to and from a bed to a chair (including a wheelchair)?: A Lot Help needed standing up from a chair using your arms (e.g., wheelchair or bedside chair)?: A Lot Help needed to walk in hospital room?: A Lot Help needed climbing 3-5 steps with a railing? : Total 6 Click Score: 11    End of Session   Activity Tolerance: Patient limited by fatigue;Patient limited by pain Patient left: with call bell/phone within reach;in chair;with chair alarm set;with nursing/sitter in room Nurse Communication: Mobility status PT Visit Diagnosis: Muscle weakness (generalized) (M62.81);Pain;History of falling (Z91.81);Difficulty in walking, not elsewhere classified (R26.2) Pain - part of body:  (abdomen)     Time: [id number] PT Time Calculation (min) (ACUTE ONLY): 27 min  Charges:  $Therapeutic Activity: 23-37 mins                     Stacie Glaze, PT Acute Rehabilitation Services Pager [phone]  Office [phone]  Roxine Caddy E Ruffin Pyo 02/20/2021, 3:41 PM

## 2021-02-20 NOTE — PMR Pre-admission (Shared)
PMR Admission Coordinator Pre-Admission Assessment  Patient: Renee Wilkinson is an 50 y.o., female MRN: [id number] DOB: [birth year] Height: '5\' 5"'$  (165.1 cm) Weight: 117.4 kg  Insurance Information HMO:     PPO:      PCP:      IPA:      80/20:      OTHER:  PRIMARY: Medicaid of [place]      Policy#: [id number]      Subscriber: Pt CM Name: ***      Phone#: ***     Fax#: *** Pre-Cert#: ***      Employer: *** Benefits:  Phone #: ***     Name: *** Eff. Date: ***     Deduct: ***      Out of Pocket Max: ***      Life Max: *** CIR: ***      SNF: *** Outpatient: ***     Co-Pay: *** Home Health: ***      Co-Pay: *** DME: ***     Co-Pay: *** Providers: *** SECONDARY: none   Policy#:      Phone#:   Financial Counselor:       Phone#:   The "Data Collection Information Summary" for patients in Inpatient Rehabilitation Facilities with attached "Privacy Act [place] Records" was provided and verbally reviewed with: Patient  Emergency Contact Information Contact Information    Name Relation Home Work [place], [place] Daughter   [phone]   Carlyon Shadow Daughter [phone]  [phone]      Current Medical History  Patient Admitting Diagnosis: AKI with Rabdo d/t myositis History of Present Illness: 50 year old female with history of psychosis, cocaine use was found unresponsive and was admitted with hyperkalemia, renal failure, shock liver with imaging consistent with renal infarcts.  She was intubated and admitted to ICU and started on CRRT.  She required pressors.  She was found to have significant myositis.  There was a concern for compartment syndrome for which orthopedics was consulted who stated that she did not have compartment syndrome.  Right upper quadrant ultrasound was positive for Murphy sign but there was no gallbladder wall thickening but there were stones and sludge present.  She was started on IV antibiotics.  2D echo showed EF of 60 to 65%.  She was  subsequently extubated.  She also developed small bowel obstruction for which general surgery was consulted and patient was managed conservatively with NG tube.  Neurology was also subsequently consulted for left leg weakness and numbness: MRI of thoracic and lumbar spine showed extensive myositis with no abscess.  HIDA scan showed delayed gallbladder wall feeling but was negative for acute cholecystitis.  Patient was switched to intermittent hemodialysis. Nephrology stating that pt. Will likely need HD for the immediate future and tunneled catheter placed in IR on 02/20/21.     Patient's medical record from [facility] has been reviewed by the rehabilitation admission coordinator and physician.  Past Medical History  History reviewed. No pertinent past medical history.  Family History   family history is not on file.  Prior Rehab/Hospitalizations Has the patient had prior rehab or hospitalizations prior to admission? No  Has the patient had major surgery during 100 days prior to admission? Yes   Current Medications  Current Facility-Administered Medications:  .  0.9 %  sodium chloride infusion (Manually program via Guardrails IV Fluids), , Intravenous, Once, Ogan, Okoronkwo U, MD .  ALPRAZolam Duanne Moron) tablet 0.5 mg, 0.5 mg, Oral, TID PRN, Haynes Kerns,  Danae Orleans, RPH, 0.5 mg at 02/19/21 2317 .  belladonna-opium (B&O) suppository 16.2-'60mg'$ , 1 suppository, Rectal, Q8H PRN, Anders Simmonds, MD .  Chlorhexidine Gluconate Cloth 2 % PADS 6 each, 6 each, Topical, Daily, Juanito Doom, MD, 6 each at 02/19/21 [phone] .  docusate (COLACE) 50 MG/5ML liquid 100 mg, 100 mg, Oral, BID PRN, Blenda Nicely, RPH .  famotidine (PEPCID) tablet 20 mg, 20 mg, Oral, Daily, Blenda Nicely, RPH, 20 mg at 02/19/21 0900 .  feeding supplement (ENSURE ENLIVE / ENSURE PLUS) liquid 237 mL, 237 mL, Oral, TID BM, Alekh, Kshitiz, MD, 237 mL at 02/19/21 0902 .  gabapentin (NEURONTIN) capsule 100 mg, 100 mg,  Oral, BID, Alekh, Kshitiz, MD, 100 mg at 02/19/21 2146 .  heparin injection 5,000 Units, 5,000 Units, Subcutaneous, Q8H, Allred, Darrell K, PA-C, 5,000 Units at 02/19/21 2146 .  HYDROmorphone (DILAUDID) injection 0.5 mg, 0.5 mg, Intravenous, Q2H PRN, Ogan, Okoronkwo U, MD, 0.5 mg at 02/20/21 0825 .  MEDLINE mouth rinse, 15 mL, Mouth Rinse, BID, Hunsucker, Bonna Gains, MD, 15 mL at 02/19/21 2146 .  methocarbamol (ROBAXIN) tablet 500 mg, 500 mg, Oral, Q6H PRN, Opyd, Ilene Qua, MD, 500 mg at 02/19/21 2319 .  OLANZapine (ZYPREXA) tablet 5 mg, 5 mg, Oral, QHS, Blenda Nicely, RPH, 5 mg at 02/19/21 2146 .  ondansetron (ZOFRAN) injection 4 mg, 4 mg, Intravenous, Q6H PRN, Hunsucker, Bonna Gains, MD, 4 mg at 02/12/21 1452 .  oxyCODONE (Oxy IR/ROXICODONE) immediate release tablet 5 mg, 5 mg, Oral, Q6H PRN, Hunsucker, Bonna Gains, MD, 5 mg at 02/19/21 2146 .  phenol (CHLORASEPTIC) mouth spray 1 spray, 1 spray, Mouth/Throat, PRN, Spero Geralds, MD, 1 spray at 02/15/21 [phone] .  sertraline (ZOLOFT) tablet 50 mg, 50 mg, Oral, Daily, McQuaid, Douglas B, MD, 50 mg at 02/19/21 0901 .  sodium chloride flush (NS) 0.9 % injection 10-40 mL, 10-40 mL, Intracatheter, Q12H, Hunsucker, Bonna Gains, MD, 10 mL at 02/19/21 2146 .  sodium chloride flush (NS) 0.9 % injection 10-40 mL, 10-40 mL, Intracatheter, PRN, Hunsucker, Bonna Gains, MD, 10 mL at 02/18/21 2152 .  traZODone (DESYREL) tablet 150 mg, 150 mg, Oral, QHS, McQuaid, Douglas B, MD, 150 mg at 02/19/21 2146  Patients Current Diet:  Diet Order            Diet NPO time specified  Diet effective midnight                 Precautions / Restrictions Precautions Precautions: Fall Restrictions Weight Bearing Restrictions: No   Has the patient had 2 or more falls or a fall with injury in the past year? No  Prior Activity Level Limited Community (1-2x/wk): Pt. was active in the community PTA  Prior Functional Level Self Care: Did the patient need help bathing,  dressing, using the toilet or eating? Independent  Indoor Mobility: Did the patient need assistance with walking from room to room (with or without device)? Independent  Stairs: Did the patient need assistance with internal or external stairs (with or without device)? Independent  Functional Cognition: Did the patient need help planning regular tasks such as shopping or remembering to take medications? Independent  Home Assistive Devices / Equipment Home Assistive Devices/Equipment: None  Prior Device Use: Indicate devices/aids used by the patient prior to current illness, exacerbation or injury? None of the above  Current Functional Level Cognition  Overall Cognitive Status: No family/caregiver present to determine baseline cognitive functioning Orientation Level: Oriented X4 General Comments:  Anxious and with slow processing.  Inconsistent following cues.    Extremity Assessment (includes Sensation/Coordination)  Upper Extremity Assessment: Generalized weakness (limited testing on R UE 2* CRRT port; L UE ROM WFL)  Lower Extremity Assessment: RLE deficits/detail,LLE deficits/detail,Generalized weakness,Difficult to assess due to impaired cognition RLE Deficits / Details: Pt inconsistent with follow cues but able to DF to neutral and perform LAQ with sitting on EOB LLE Deficits / Details: Pt very inconsistent with following cues but with noted DF almost to neutral and 3-/5 quads with pt able to perform a near complete LAQ with sitting EOB    ADLs       Mobility  Overal bed mobility: Needs Assistance Bed Mobility: Rolling,Sidelying to Sit,Sit to Supine Rolling: Mod assist Sidelying to sit: Mod assist,+2 for safety/equipment Sit to supine: Mod assist,Max assist,+2 for physical assistance,+2 for safety/equipment General bed mobility comments: Pt mod assist to elevated trunk and to help LES to EOB.  Use of pad to scoot out.    Transfers  Overall transfer level: Needs  assistance Transfers: Lateral/Scoot Transfers  Lateral/Scoot Transfers: Mod assist,Min assist,From elevated surface,+2 safety/equipment General transfer comment: Pad was not under thept well enought to use.  Dropped arm of recliner and had pt scoot to the chair.  Blocked pts feet/knees so that her feet would be planted and pt scooted in increments.  Pt needed cues to stop and reposition her feet as they needed to be repositioned frequently for optimal position for scooting.  Mod assist and mod cues for safe technique as pt at times fearful and not following PT instruction.  Did instruct nursing that if they drop arm of the chair and make it level to bed and use the pad to scoot back keeping pts feet in good position, pt will do well getting back to bed.    Ambulation / Gait / Stairs / Office manager / Balance Dynamic Sitting Balance Sitting balance - Comments: relies on UE support to sit unsupported.  min guard to sit EOB Balance Overall balance assessment: Needs assistance Sitting-balance support: Bilateral upper extremity supported,Feet supported Sitting balance-Leahy Scale: Poor Sitting balance - Comments: relies on UE support to sit unsupported.  min guard to sit EOB    Special needs/care consideration Skin *** and Special service needs Pt.'s spouse recenty deceased in incident that led to admission   Previous Home Environment (from acute therapy documentation) Living Arrangements: Alone  Lives With: Spouse (Deceased during incident that led to admission) Available Help at Discharge: Family Type of Home: Other(Comment) (was living in extended stay motel) Home Layout: One level Home Access: Level entry Bathroom Shower/Tub: Chiropodist: Standard Bathroom Accessibility: Yes How Accessible: Accessible via walker [place]: No  Discharge Living Setting Does the patient have any problems obtaining your medications?:  No  Social/Family/Support Systems Patient Roles: Other (Comment) Contact Information: [phone] Anticipated Caregiver: Carrieann Gilvin Anticipated Caregiver's Contact Information: [phone] Ability/Limitations of Caregiver: [redacted] weeks pregnant, min A only Caregiver Availability: Evenings only Discharge Plan Discussed with Primary Caregiver: Yes Is Caregiver In Agreement with Plan?: Yes  Goals Patient/Family Goal for Rehab: PT/OT Mod I Expected length of stay: 12-14 days Pt/Family Agrees to Admission and willing to participate: Yes Program Orientation Provided & Reviewed with Pt/Caregiver Including Roles  & Responsibilities: Yes  Decrease burden of Care through IP rehab admission: Specialzed equipment needs, Decrease number of caregivers, Bowel and bladder program and Patient/family education  Possible need for SNF  placement upon discharge: not anticipated  Patient Condition: {PATIENT'S CONDITION:22832}  Preadmission Screen Completed By:  Genella Mech, 02/20/2021 9:01 AM ______________________________________________________________________   Discussed status with Dr. Marland Kitchen on *** at *** and received approval for admission today.  Admission Coordinator:  Genella Mech, CCC-SLP, time ***Sudie Grumbling ***   Assessment/Plan: Diagnosis: 1. Does the need for close, 24 hr/day Medical supervision in concert with the patient's rehab needs make it unreasonable for this patient to be served in a less intensive setting? {yes_no_potentially:3041433} 2. Co-Morbidities requiring supervision/potential complications: *** 3. Due to {due RE:257123, does the patient require 24 hr/day rehab nursing? {yes_no_potentially:3041433} 4. Does the patient require coordinated care of a physician, rehab nurse, PT, OT, and SLP to address physical and functional deficits in the context of the above medical diagnosis(es)? {yes_no_potentially:3041433} Addressing deficits in the following areas:  {deficits:3041436} 5. Can the patient actively participate in an intensive therapy program of at least 3 hrs of therapy 5 days a week? {yes_no_potentially:3041433} 6. The potential for patient to make measurable gains while on inpatient rehab is {potential:3041437} 7. Anticipated functional outcomes upon discharge from inpatient rehab: {functional outcomes:304600100} PT, {functional outcomes:304600100} OT, {functional outcomes:304600100} SLP 8. Estimated rehab length of stay to reach the above functional goals is: *** 9. Anticipated discharge destination: {anticipated dc setting:21604} 10. Overall Rehab/Functional Prognosis: {potential:3041437}   MD Signature: ***

## 2021-02-20 NOTE — Progress Notes (Signed)
Pt's HD tx has been moved to 3/29/202.

## 2021-02-20 NOTE — Progress Notes (Signed)
Inpatient Rehab Admissions Coordinator:  Spoke with pt at bedside.  Pt not medically ready for potential  admssion to CIR.  Pt awaiting placement of tunneled line on 3/29.  Will continue to follow.    Gayland Curry, [place], [place] Admissions Coordinator [phone]

## 2021-02-20 NOTE — Consult Note (Addendum)
Patient Status: [facility] - In-pt  Assessment and Plan: History of AKI in need of HD s/p left IJ non-tunneled HD catheter placement bedside by CCM; with need for longer term HD access. Plan for image-guided tunneled HD catheter placement in IR tentatively for today pending IR scheduling and repeat CBC result. Patient is NPO. Afebrile, however with leukocytosis- will repeat CBC today prior to placement.  Risks and benefits discussed with the patient including, but not limited to bleeding, infection, vascular injury, pneumothorax which may require chest tube placement, air embolism or even death. All of the patient's questions were answered, patient is agreeable to proceed. Consent signed and in IR control room.   ADDENDUM: repeat CBC today with leukocytosis. Discussed case with Dr. Dwaine Gale who recommended waiting until WBCs normalize to place tunneled line. Will restart diet today, will be NPO at midnight in anticipation for possible IR procedure tomorrow 02/21/2021 pending repeat CBC and IR schedule. Dr. Starla Link made aware.   ______________________________________________________________________   History of Present Illness: Renee Wilkinson is a 50 y.o. female with a past medical history of drug abuse. She has been admitted to [facility] since 02/09/2021 for management of AKI/hyperkalemia/shocked liver secondary to rhabdomyolysis. Per notes, patient was found down beside husband (who was pronounced dead) after using a substance that she had "stayed away from for a long time". Nephrology was consulted on admission. Left IJ non-tunneled HD catheter was placed bedside 02/09/2021 by CCM and CRRT was initiated. Unfortunately, she has not had any renal recovery. In addition, has had issues with low flow rates during HD using left IJ non-tunneled HD catheter.  IR consulted by Dr. Candiss Norse for possible image-guided tunneled HD catheter placement. Patient awake and alert laying in bed. Complains of chest pain, rated  6/10 at this time. States it is related to her anxiety. Complains of bilateral lower extremity pain, L>R, ongoing issue during this admission. Denies fever, chills, dyspnea, abdominal pain, or headache.   Allergies and medications reviewed.   Review of Systems: A 12 point ROS discussed and pertinent positives are indicated in the HPI above.  All other systems are negative.  Review of Systems  Constitutional: Negative for chills and fever.  Respiratory: Negative for shortness of breath and wheezing.   Cardiovascular: Positive for chest pain. Negative for palpitations.  Gastrointestinal: Negative for abdominal pain.  Musculoskeletal:       Positive for bilateral lower extremity pain.  Neurological: Negative for headaches.  Psychiatric/Behavioral: Negative for behavioral problems and confusion.    Vital Signs: BP 130/77 (BP Location: Left Arm)   Pulse 100   Temp 98 F (36.7 C) (Oral)   Resp 20   Ht '5\' 5"'$  (1.651 m)   Wt 258 lb 12.8 oz (117.4 kg)   SpO2 97%   BMI 43.07 kg/m   Physical Exam Constitutional:      General: She is not in acute distress.    Appearance: Normal appearance.  Cardiovascular:     Rate and Rhythm: Normal rate and regular rhythm.     Heart sounds: Normal heart sounds. No murmur heard.     Comments: (+) left IJ non-tunneled HD catheter, site c/d/i. Pulmonary:     Effort: Pulmonary effort is normal. No respiratory distress.     Breath sounds: Normal breath sounds. No wheezing.  Skin:    General: Skin is warm and dry.  Neurological:     Mental Status: She is alert and oriented to person, place, and time.      Imaging  reviewed.   Labs:  COAGS: Recent Labs    02/12/21 0500 02/12/21 1700 02/13/21 0559 02/14/21 0413 02/15/21 0458  INR  --  1.0  --   --   --   APTT 39*  --  38* 35 39*    BMP: Recent Labs    02/16/21 0450 02/17/21 0336 02/18/21 0102 02/19/21 0240  NA 134* 129* 129* 131*  K 4.1 3.9 4.1 4.3  CL 103 100 100 100  CO2  21* 20* 19* 21*  GLUCOSE 94 109* 122* 114*  BUN 38* 49* 60* 56*  CALCIUM 7.5* 7.4* 7.4* 7.4*  CREATININE 4.35* 5.58* 6.83* 6.75*  GFRNONAA 12* 9* 7* 7*       Electronically Signed: Earley Abide, PA-C 02/20/2021, 8:55 AM   I spent a total of 15 minutes in face to face in clinical consultation, greater than 50% of which was counseling/coordinating care for HD access.

## 2021-02-20 NOTE — Plan of Care (Signed)
Patient is currently resting in bed. C/o left leg pain and spasm, given PRN meds. Repositioned for comfort. VSS. NPO since MN for IR procedure. Call bell within reach. No other complaints at this time.   Problem: Education: Goal: Knowledge of disease and its progression will improve Outcome: Progressing   Problem: Health Behavior/Discharge Planning: Goal: Ability to manage health-related needs will improve Outcome: Progressing   Problem: Clinical Measurements: Goal: Complications related to the disease process or treatment will be avoided or minimized Outcome: Progressing Goal: Dialysis access will remain free of complications Outcome: Progressing   Problem: Activity: Goal: Activity intolerance will improve Outcome: Progressing   Problem: Fluid Volume: Goal: Fluid volume balance will be maintained or improved Outcome: Progressing   Problem: Nutritional: Goal: Ability to make appropriate dietary choices will improve Outcome: Progressing   Problem: Respiratory: Goal: Respiratory symptoms related to disease process will be avoided Outcome: Progressing   Problem: Self-Concept: Goal: Body image disturbance will be avoided or minimized Outcome: Progressing   Problem: Urinary Elimination: Goal: Progression of disease will be identified and treated Outcome: Progressing

## 2021-02-20 NOTE — Progress Notes (Signed)
[facility] Progress Note  Subjective: pt seen and examined bedside this am.  Upset about husbands death, in pain with back and inability to move L leg which is ongoing issue.  TDC placement on hold in light of leukocytosis.  Afebrile.  UOP 517m   Vitals:   02/19/21 2026 02/20/21 0024 02/20/21 0409 02/20/21 0748  BP: 135/64 (!) 108/56 130/77 130/77  Pulse: 97 96 95 100  Resp: (!) '25 20 19 20  '$ Temp: 98.2 F (36.8 C) 99.3 F (37.4 C) 98.7 F (37.1 C) 98 F (36.7 C)  TempSrc: Oral Oral Oral Oral  SpO2: 99% 94% 97% 97%  Weight:      Height:        Exam: Gen: nad, sitting up in bed CV: s1s2, rrr Resp: cta b/l Abd: soft Ext: tense edema bilateral LEs Neuro: awake, alert, speech clear and coherent Dialysis Access: Left IJ temp line    Assessment/ Plan: 1. AKI w/ CPK > 50,000 on admission. 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 She did have contrast on 3/17 so this may have hindered any chances of renal recovery thereafter (although benefits outweighed risks at the time of contrast administration). She is S/P CRRT 3/17- 3/23.  Unfortunately, she has not had any renal recovery and given her radiographic findings and clinical course, I am suspecting there is a low chance of recovery (but still monitoring for any signs of recovery). At they very least, I am suspecting that she will need renal replacement therapy for at least the immediate foreseeable future.   1. Will consult IR for tunneled dialysis catheter placement given catheter issues and the aforementioned plan/thoughts --> was planned  3/28 but deferred in light of ^ WBC, they will reassess tomorrow. No clear infection 2. Plan for HD again today - had poor catheter function last tx but would like to run today again even with low BFR to facilitate UF given edema.  2. Hypertension/volume: BP's good, b/l LE's edematous, UF as tolerated w/ HD. Add  torsemide 100 daily; UOP was 0.5L yesterday without diuretic.  3. Rhabdomyolysis: was down for > 6 hrs, peak CPK > 50K on admit 3/17.  Improved 4. Hyponatremia, hypervolemic: managing with HD, uf as tolerated 5. Abd pain: RUQ UKoreashowing gallbladder sludge. Gen surgery evaluated. ^LFT's are likely muscle enzymes associated w/ her rhabdo incident. Does have signs of choleycystitis on imaging--mgmt per primary 6. Substance abuse: her husband is deceased following the incident.  Spiritual care involved and psychiatry consulted   LJannifer HickMD [facility] Pager [phone]     Recent Labs  Lab 02/17/21 [id number]/26/22 0102 02/19/21 0240 02/20/21 0921  K 3.9 4.1 4.3 4.8  BUN 49* 60* 56* 70*  CREATININE 5.58* 6.83* 6.75* 8.24*  CALCIUM 7.4* 7.4* 7.4* 7.6*  PHOS 4.0 4.5  --   --   HGB 6.6* 8.5* 7.2* 7.2*   Inpatient medications: . sodium chloride   Intravenous Once  . Chlorhexidine Gluconate Cloth  6 each Topical Daily  . famotidine  20 mg Oral Daily  . feeding supplement  237 mL Oral TID BM  . gabapentin  100 mg Oral BID  . heparin  5,000 Units Subcutaneous Q8H  . mouth rinse  15 mL Mouth Rinse BID  . OLANZapine  5 mg Oral QHS  . sertraline  50 mg Oral Daily  . sodium chloride flush  10-40 mL Intracatheter Q12H  . traZODone  150 mg Oral QHS    ALPRAZolam, opium-belladonna, docusate, HYDROmorphone (DILAUDID) injection, methocarbamol, ondansetron (ZOFRAN) IV, oxyCODONE, phenol, sodium chloride flush

## 2021-02-21 DIAGNOSIS — R443 Hallucinations, unspecified: Secondary | ICD-10-CM

## 2021-02-21 DIAGNOSIS — N179 Acute kidney failure, unspecified: Secondary | ICD-10-CM | POA: Diagnosis not present

## 2021-02-21 DIAGNOSIS — J9601 Acute respiratory failure with hypoxia: Secondary | ICD-10-CM | POA: Diagnosis not present

## 2021-02-21 DIAGNOSIS — M609 Myositis, unspecified: Secondary | ICD-10-CM | POA: Diagnosis not present

## 2021-02-21 LAB — CBC WITH DIFFERENTIAL/PLATELET
Abs Immature Granulocytes: 0.1 10*3/uL — ABNORMAL HIGH (ref 0.00–0.07)
Basophils Absolute: 0.1 10*3/uL (ref 0.0–0.1)
Basophils Relative: 1 %
Eosinophils Absolute: 0.2 10*3/uL (ref 0.0–0.5)
Eosinophils Relative: 2 %
HCT: 19.6 % — ABNORMAL LOW (ref 36.0–46.0)
Hemoglobin: 6.3 g/dL — CL (ref 12.0–15.0)
Lymphocytes Relative: 35 %
Lymphs Abs: 4.2 10*3/uL — ABNORMAL HIGH (ref 0.7–4.0)
MCH: 29 pg (ref 26.0–34.0)
MCHC: 32.1 g/dL (ref 30.0–36.0)
MCV: 90.3 fL (ref 80.0–100.0)
Monocytes Absolute: 0.5 10*3/uL (ref 0.1–1.0)
Monocytes Relative: 4 %
Myelocytes: 1 %
Neutro Abs: 6.8 10*3/uL (ref 1.7–7.7)
Neutrophils Relative %: 57 %
Platelets: 274 10*3/uL (ref 150–400)
RBC: 2.17 MIL/uL — ABNORMAL LOW (ref 3.87–5.11)
RDW: 14.8 % (ref 11.5–15.5)
WBC: 12 10*3/uL — ABNORMAL HIGH (ref 4.0–10.5)
nRBC: 0 % (ref 0.0–0.2)
nRBC: 2 /100 WBC — ABNORMAL HIGH

## 2021-02-21 LAB — PREPARE RBC (CROSSMATCH)

## 2021-02-21 LAB — BASIC METABOLIC PANEL
Anion gap: 9 (ref 5–15)
BUN: 58 mg/dL — ABNORMAL HIGH (ref 6–20)
CO2: 24 mmol/L (ref 22–32)
Calcium: 7.4 mg/dL — ABNORMAL LOW (ref 8.9–10.3)
Chloride: 99 mmol/L (ref 98–111)
Creatinine, Ser: 6.3 mg/dL — ABNORMAL HIGH (ref 0.44–1.00)
GFR, Estimated: 8 mL/min — ABNORMAL LOW (ref >60)
Glucose, Bld: 97 mg/dL (ref 70–99)
Potassium: 4.2 mmol/L (ref 3.5–5.1)
Sodium: 132 mmol/L — ABNORMAL LOW (ref 135–145)

## 2021-02-21 MED ORDER — HYDROMORPHONE HCL 1 MG/ML IJ SOLN
INTRAMUSCULAR | Status: AC
  Filled 2021-02-21: qty 0.5

## 2021-02-21 MED ORDER — SODIUM CHLORIDE 0.9% IV SOLUTION: Freq: Once | INTRAVENOUS | Status: AC

## 2021-02-21 MED ORDER — HEPARIN SODIUM (PORCINE) 1000 UNIT/ML IJ SOLN
INTRAMUSCULAR | Status: AC
  Administered 2021-02-21: 2800 [IU]
  Filled 2021-02-21: qty 3

## 2021-02-21 MED ORDER — OLANZAPINE 10 MG PO TABS
10.0000 mg | ORAL_TABLET | Freq: Every day | ORAL | Status: DC
  Administered 2021-02-21 – 2021-03-06 (×14): 10 mg via ORAL
  Filled 2021-02-21 (×16): qty 1

## 2021-02-21 NOTE — Progress Notes (Signed)
Inpatient Rehab Admissions Coordinator:   I do not have a CIR bed for this Pt. Today. Noted she is now requiring sitter due to hallucinations. Psych consulted. I reached out to nephrologist who reports she will get back to me regarding when Pt. Might be clipped for outpatient HD. I also spoke with Pt's daughter [name] who reports that following CIR, family wants Pt. To return to [place]  to stay with her mother for 2 weeks, and then possibly with her sister, father, and step-mother (all of whom are retired). I will continue to follow for potential admit pending medical readiness, insurance, and bed availability.   Clemens Catholic, [place], [place] Admissions Coordinator  [phone] ([place]) [phone] (office)

## 2021-02-21 NOTE — Progress Notes (Signed)
[facility] Progress Note  Subjective: pt seen and examined bedside this afternoon.  No new issues.  Tolerated HD ok.     Vitals:   02/21/21 0900 02/21/21 0930 02/21/21 0942 02/21/21 1000  BP: (!) 112/49 121/66 121/66 91/68  Pulse: 63 (!) 105  63  Resp: '16 17 18 12  '$ Temp:      TempSrc:      SpO2:      Weight:      Height:        Exam: Gen: nad, sitting up in bed CV: s1s2, rrr Resp: cta b/l Abd: soft Ext: tense edema bilateral LEs Neuro: awake, alert, speech clear and coherent Dialysis Access: Left IJ temp line    Assessment/ Plan: 1. AKI w/ CPK > 50,000 on admission. 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 She did have contrast on 3/17 so this may have hindered any chances of renal recovery thereafter (although benefits outweighed risks at the time of contrast administration). She is S/P CRRT 3/17- 3/23.  Now on iHD - 3/29 labs at pre HD encouraging for some improvement in renal function; she's also making more urine. I suspect with the degree of insults she sustained this will be a protracted recovery so still proceed to [facility] placement tomorrow as LIJ temp cath is poorly functioning. 2. Hypertension/volume: BP's good, b/l LE's edematous, UF as tolerated w/ HD. Cont torsemide 100 daily.   3. Rhabdomyolysis: was down for > 6 hrs, peak CPK > 50K on admit 3/17.  Improved 4. Hyponatremia, hypervolemic: managing with HD, uf as tolerated 5. Abd pain: RUQ US showing gallbladder sludge. Gen surgery evaluated. ^LFT's are likely muscle enzymes associated w/ her rhabdo incident. Does have signs of choleycystitis on imaging--mgmt per primary 6. Substance abuse: her husband is deceased following the incident.  Spiritual care involved and psychiatry consulted   Jannifer Hick MD [facility] Pager [phone]      Recent Labs  Lab 02/17/21 [phone] 02/18/21 0102 02/19/21 0240 02/20/21 0921  02/21/21 0839  K 3.9 4.1   < > 4.8 4.2  BUN 49* 60*   < > 70* 58*  CREATININE 5.58* 6.83*   < > 8.24* 6.30*  CALCIUM 7.4* 7.4*   < > 7.6* 7.4*  PHOS 4.0 4.5  --   --   --   HGB 6.6* 8.5*   < > 7.2* 6.3*   < > = values in this interval not displayed.   Inpatient medications: . sodium chloride   Intravenous Once  . sodium chloride   Intravenous Once  . Chlorhexidine Gluconate Cloth  6 each Topical Daily  . famotidine  20 mg Oral Daily  . feeding supplement  237 mL Oral TID BM  . gabapentin  100 mg Oral BID  . heparin  5,000 Units Subcutaneous Q8H  . mouth rinse  15 mL Mouth Rinse BID  . OLANZapine  10 mg Oral QHS  . sertraline  50 mg Oral Daily  . sodium chloride flush  10-40 mL Intracatheter Q12H  . torsemide  100 mg Oral Daily  . traZODone  150 mg Oral QHS    ALPRAZolam, opium-belladonna, docusate, haloperidol lactate, HYDROmorphone (DILAUDID) injection, methocarbamol, ondansetron (ZOFRAN) IV, oxyCODONE, phenol, sodium chloride flush

## 2021-02-21 NOTE — Progress Notes (Signed)
CSW emailed [employer], [place], to see if she can assist with transitioning patient to [facility] Medicaid.  Makyah Lavigne LCSW

## 2021-02-21 NOTE — Progress Notes (Signed)
IR consulted by Dr. Candiss Norse for possible image-guided tunneled HD catheter placement.  Unable to accommodate patient's procedure today secondary to IR schedule. Diet restarted- patient made NPO at midnight in anticipation for possible IR procedure 02/22/2021. RN updated.  Please call IR with questions/concerns.   Bea Graff Swayze Pries, PA-C 02/21/2021, 4:37 PM

## 2021-02-21 NOTE — Plan of Care (Signed)
Patient is currently resting in bed. C/o pain, given PRN pain medications. Sitter at bedside. VSS. Call bell within reach.   Problem: Education: Goal: Knowledge of disease and its progression will improve Outcome: Progressing   Problem: Health Behavior/Discharge Planning: Goal: Ability to manage health-related needs will improve Outcome: Progressing   Problem: Clinical Measurements: Goal: Complications related to the disease process or treatment will be avoided or minimized Outcome: Progressing Goal: Dialysis access will remain free of complications Outcome: Progressing   Problem: Activity: Goal: Activity intolerance will improve Outcome: Progressing   Problem: Fluid Volume: Goal: Fluid volume balance will be maintained or improved Outcome: Progressing   Problem: Nutritional: Goal: Ability to make appropriate dietary choices will improve Outcome: Progressing   Problem: Respiratory: Goal: Respiratory symptoms related to disease process will be avoided Outcome: Progressing   Problem: Self-Concept: Goal: Body image disturbance will be avoided or minimized Outcome: Progressing   Problem: Urinary Elimination: Goal: Progression of disease will be identified and treated Outcome: Progressing

## 2021-02-21 NOTE — Consult Note (Signed)
  50 year old female with history of psychosis, cocaine use was found unresponsive and was admitted with hyperkalemia, renal failure, shock liver with imaging consistent with renal infarcts.  She was intubated and admitted to ICU and started on CRRT.  She required pressors.  She was found to have significant myositis.  There was a concern for compartment syndrome for which orthopedics was consulted who stated that she did not have compartment syndrome.  Right upper quadrant ultrasound was positive for Murphy sign but there was no gallbladder wall thickening but there were stones and sludge present.  She was started on IV antibiotics.  2D echo showed EF of 60 to 65%.  She was subsequently extubated.  She also developed small bowel obstruction for which general surgery was consulted and patient was managed conservatively with NG tube.  Neurology was also subsequently consulted for left leg weakness and numbness: MRI of thoracic and lumbar spine showed extensive myositis with no abscess.  HIDA scan showed delayed gallbladder wall feeling but was negative for acute cholecystitis.  Patient was switched to intermittent hemodialysis and was transferred to [facility].  She was transferred to [facility] service on 02/17/2021.  Psych consult placed for ongoing hallucinations. Patient is off the unit at this time for hemodialysis. Medications adjustments have been made to include increasing zyprexa 7.'5mg'$  po qhs to further target hallucinations and psychosis, yesterday.  Recommend we continue safety sitter until her mentation improves.   - Continue current psychotropic medications.    Delirium/Psychosis: -Increase zyprexa 10 mg po qhs  -Recommend we initiate delirium precautions as patient is exhibiting altered sensorium and hallucinations.  -Continue safety sitter at this time.

## 2021-02-21 NOTE — Progress Notes (Signed)
Report given to dialysis RN. Informed patient has a Actuary and will be coming with patient.

## 2021-02-21 NOTE — Progress Notes (Signed)
Patient ID: Renee Wilkinson, female   DOB: Apr 12, 1971, 50 y.o.   MRN: [id number]   PROGRESS NOTE    Renee Wilkinson  [id number] DOB: 11-Nov-1971 DOA: 02/09/2021 PCP: Patient, No Pcp Per   Brief Narrative:  50 year old female with history of psychosis, cocaine use was found unresponsive and was admitted with hyperkalemia, renal failure, shock liver with imaging consistent with renal infarcts.  She was intubated and admitted to ICU and started on CRRT.  She required pressors.  She was found to have significant myositis.  There was a concern for compartment syndrome for which orthopedics was consulted who stated that she did not have compartment syndrome.  Right upper quadrant ultrasound was positive for Murphy sign but there was no gallbladder wall thickening but there were stones and sludge present.  She was started on IV antibiotics.  2D echo showed EF of 60 to 65%.  She was subsequently extubated.  She also developed small bowel obstruction for which general surgery was consulted and patient was managed conservatively with NG tube.  Neurology was also subsequently consulted for left leg weakness and numbness: MRI of thoracic and lumbar spine showed extensive myositis with no abscess.  HIDA scan showed delayed gallbladder wall feeling but was negative for acute cholecystitis.  Patient was switched to intermittent hemodialysis and was transferred to [facility].  She was transferred to [facility] service on 02/17/2021.  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 -Nephrology following: Status post CRRT from 02/09/2021-02/15/2021.  Transferred to [facility] for intermittent hemodialysis. -Dialysis as per nephrology schedule.  IV fluids discontinued on 02/17/2021.  Chronic cholecystitis Cholelithiasis with elevated LFTs -HIDA  scan and CT imaging negative for acute cholecystitis.  No plans for percutaneous drain placement or surgery at this time.  Zosyn discontinued on 02/17/2021 after discussion with general surgery -LFTs improving.  Monitor intermittently.  Partial small bowel obstruction secondary to intermittent incarceration of umbilical hernia -General surgery following.  Treated with NG tube and bowel rest.  NG tube has been removed.  Bowel function improved. -Tolerating advanced diet.  Left leg numbness/weakness L4-5 spinal stenosis and disc bulging Severe myositis of paraspinal musculature -Neurology evaluated the patient and has signed off. -PT recommended CIR placement.  CIR following. -Still complaining of significant pain in the lower extremity.  Continue gabapentin and as needed opiates.  Acute opiate overdose Acute grief Schizophrenia/depression -Psychiatry evaluated the patient during this hospitalization: Patient does not need inpatient psychiatric hospitalization.  Outpatient follow-up with psychiatry -Continue olanzapine/Zoloft/trazodone.  As needed Xanax.  Minimize sedation -Patient had increasing hallucinations with?  Suicidal ideation on 02/12/2021: Psychiatry has been reconsulted.  Follow recommendations.  Continue one-to-one observation.  Demand ischemia -Probably from narcotic overdose.  Echo looked okay.  No further work-up needed  Acute hypoxic respiratory failure -Status post intubation and extubation.  Resolved.  Currently on room air  Acute toxic encephalopathy -Probably from drug overdose.  Mental status has much improved.  Monitor.  Shock liver -LFTs improving.  Monitor intermittently  Shock: Present on admission; possibly from acute kidney injury and overdose -Sepsis was ruled out.  -Needed pressors initially which was subsequently discontinued. -Shock has resolved.  Hemodynamically stable currently.  Leukocytosis -Improving.  Monitor intermittently  Anemia of chronic  disease -From renal failure.  Hemoglobin 7.2 on 02/20/2021.  Status post 1 unit packed red cell transfusion on  02/17/2021 for hemoglobin of 6.6.  Thrombocytopenia -Resolved  Hyponatremia  -Nephrology following.  Currently being managed by dialysis.    DVT prophylaxis: Heparin Code Status: Full Family Communication: None at bedside Disposition Plan: Status is: Inpatient  Remains inpatient appropriate because:Inpatient level of care appropriate due to severity of illness   Dispo: The patient is from: Home              Anticipated d/c is to: CIR              Patient currently is medically stable to d/c.   Difficult to place patient No   Consultants: PCCM/psychiatry/nephrology/neurology/general surgery/orthopedics  Procedures: Intubation/extubation Right IJ hemodialysis catheter  Antimicrobials:  Anti-infectives (From admission, onward)   Start     Dose/Rate Route Frequency Ordered Stop   02/16/21 1000  piperacillin-tazobactam (ZOSYN) IVPB 2.25 g  Status:  Discontinued        2.25 g 100 mL/hr over 30 Minutes Intravenous Every 8 hours 02/16/21 0720 02/17/21 1047   02/13/21 1400  piperacillin-tazobactam (ZOSYN) IVPB 3.375 g  Status:  Discontinued        3.375 g 12.5 mL/hr over 240 Minutes Intravenous Every 8 hours 02/13/21 0826 02/16/21 0720   02/13/21 0815  piperacillin-tazobactam (ZOSYN) IVPB 3.375 g        3.375 g 100 mL/hr over 30 Minutes Intravenous  Once 02/13/21 0733 02/13/21 0820   02/09/21 1015  ceFEPIme (MAXIPIME) 2 g in sodium chloride 0.9 % 100 mL IVPB        2 g 200 mL/hr over 30 Minutes Intravenous  Once 02/09/21 1010 02/09/21 1112   02/09/21 1015  vancomycin (VANCOCIN) IVPB 1000 mg/200 mL premix        1,000 mg 200 mL/hr over 60 Minutes Intravenous  Once 02/09/21 1010 02/09/21 1901       Subjective: Patient seen and examined at bedside undergoing dialysis.  Still complains of bilateral lower extremity pain.  No overnight fever, vomiting, worsening  shortness of breath.   Objective: Vitals:   02/20/21 1955 02/20/21 2337 02/21/21 0426 02/21/21 0500  BP: 137/81 (!) 154/80 123/72   Pulse: 100 100 100   Resp: '17 17 19   '$ Temp: 98.7 F (37.1 C) 98.8 F (37.1 C) 98.9 F (37.2 C)   TempSrc: Axillary Axillary Axillary   SpO2: 97% 96% 94%   Weight:    120.6 kg  Height:        Intake/Output Summary (Last 24 hours) at 02/21/2021 0729 Last data filed at 02/20/2021 2041 Gross per 24 hour  Intake --  Output 700 ml  Net -700 ml   Filed Weights   02/18/21 1312 02/19/21 0500 02/21/21 0500  Weight: 118.6 kg 117.4 kg 120.6 kg    Examination:  General exam: Chronically ill looking female.  Looks older than stated age.  On room air currently.  No distress.  Poor historian Respiratory system: Bilateral decreased breath sounds at bases with some crackles, no wheezing  cardiovascular system: S1-S2 positive, rate controlled gastrointestinal system: Abdomen is still slightly distended, soft and nontender.  Bowel sounds are heard  extremities: No cyanosis; bilateral lower extremity edema present Central nervous system: Awake and oriented.  No obvious focal neurological deficits.  Skin: No obvious petechiae/lesions Psychiatry: Affect is very flat    Data Reviewed: I have personally reviewed following labs and imaging studies  CBC: Recent Labs  Lab 02/15/21 0458 02/15/21 1355 02/16/21 1011 02/17/21 0336 02/18/21 0102 02/19/21 0240 02/20/21 0921  WBC 13.9*   < >  13.1* 13.9* 15.1* 14.1* 13.5*  NEUTROABS 6.9  --   --   --  7.6 6.8 6.4  HGB 6.3*   < > 7.4* 6.6* 8.5* 7.2* 7.2*  HCT 20.1*   < > 23.2* 20.7* 24.3* 21.3* 21.2*  MCV 91.4   < > 91.7 92.0 86.5 87.7 88.3  PLT 136*   < > 158 181 231 229 284   < > = values in this interval not displayed.   Basic Metabolic Panel: Recent Labs  Lab 02/14/21 1713 02/15/21 0458 02/16/21 0450 02/17/21 0336 02/18/21 0102 02/19/21 0240 02/20/21 0921  NA 135 135 134* 129* 129* 131* 130*  K 4.4  4.4 4.1 3.9 4.1 4.3 4.8  CL 102 103 103 100 100 100 99  CO2 23 23 21* 20* 19* 21* 21*  GLUCOSE 98 94 94 109* 122* 114* 96  BUN 31* 27* 38* 49* 60* 56* 70*  CREATININE 3.55* 2.76* 4.35* 5.58* 6.83* 6.75* 8.24*  CALCIUM 7.7* 7.4* 7.5* 7.4* 7.4* 7.4* 7.6*  MG  --  2.5*  --  2.3 2.2  --   --   PHOS 3.8 2.4* 3.8 4.0 4.5  --   --    GFR: Estimated Creatinine Clearance: 10.6 mL/min (A) (by C-G formula based on SCr of 8.24 mg/dL (H)). Liver Function Tests: Recent Labs  Lab 02/14/21 1713 02/15/21 0458 02/16/21 0450 02/17/21 0336 02/18/21 0102  AST  --  228*  --   --  103*  ALT  --  139*  --   --  86*  ALKPHOS  --  105  --   --  96  BILITOT  --  0.8  --   --  0.8  PROT  --  5.2*  --   --  5.2*  ALBUMIN 2.1* 1.9*  1.9* 1.6* 1.6* 1.8*   No results for input(s): LIPASE, AMYLASE in the last 168 hours. No results for input(s): AMMONIA in the last 168 hours. Coagulation Profile: No results for input(s): INR, PROTIME in the last 168 hours. Cardiac Enzymes: Recent Labs  Lab 02/15/21 0458  CKTOTAL 3,229*   BNP (last 3 results) No results for input(s): PROBNP in the last 8760 hours. HbA1C: No results for input(s): HGBA1C in the last 72 hours. CBG: Recent Labs  Lab 02/15/21 1239 02/16/21 2016  GLUCAP 83 151*   Lipid Profile: No results for input(s): CHOL, HDL, LDLCALC, TRIG, CHOLHDL, LDLDIRECT in the last 72 hours. Thyroid Function Tests: No results for input(s): TSH, T4TOTAL, FREET4, T3FREE, THYROIDAB in the last 72 hours. Anemia Panel: No results for input(s): VITAMINB12, FOLATE, FERRITIN, TIBC, IRON, RETICCTPCT in the last 72 hours. Sepsis Labs: No results for input(s): PROCALCITON, LATICACIDVEN in the last 168 hours.  Recent Results (from the past 240 hour(s))  MRSA PCR Screening     Status: Abnormal   Collection Time: 02/16/21  4:50 AM   Specimen: Nasopharyngeal  Result Value Ref Range Status   MRSA by PCR POSITIVE (A) NEGATIVE Final    Comment:        The GeneXpert  MRSA Assay (FDA approved for NASAL specimens only), is one component of a comprehensive MRSA colonization surveillance program. It is not intended to diagnose MRSA infection nor to guide or monitor treatment for MRSA infections. RESULT CALLED TO, READ BACK BY AND VERIFIED WITH: RN K.FRPOUST AT 0830 ON 02/16/2021 BY T.SAAD Performed at [facility], [place] [address], [place], [place] [address]  Radiology Studies: No results found.      Scheduled Meds: . sodium chloride   Intravenous Once  . Chlorhexidine Gluconate Cloth  6 each Topical Daily  . famotidine  20 mg Oral Daily  . feeding supplement  237 mL Oral TID BM  . gabapentin  100 mg Oral BID  . heparin  5,000 Units Subcutaneous Q8H  . mouth rinse  15 mL Mouth Rinse BID  . OLANZapine  7.5 mg Oral QHS  . sertraline  50 mg Oral Daily  . sodium chloride flush  10-40 mL Intracatheter Q12H  . torsemide  100 mg Oral Daily  . traZODone  150 mg Oral QHS   Continuous Infusions:         Aline August, MD [facility] 02/21/2021, 7:29 AM

## 2021-02-22 ENCOUNTER — Inpatient Hospital Stay (HOSPITAL_COMMUNITY): Payer: Medicaid - Out of State

## 2021-02-22 DIAGNOSIS — F259 Schizoaffective disorder, unspecified: Secondary | ICD-10-CM

## 2021-02-22 DIAGNOSIS — K56609 Unspecified intestinal obstruction, unspecified as to partial versus complete obstruction: Secondary | ICD-10-CM | POA: Diagnosis not present

## 2021-02-22 DIAGNOSIS — N179 Acute kidney failure, unspecified: Secondary | ICD-10-CM | POA: Diagnosis not present

## 2021-02-22 HISTORY — PX: IR US GUIDE VASC ACCESS RIGHT: IMG2390

## 2021-02-22 HISTORY — PX: IR FLUORO GUIDE CV LINE RIGHT: IMG2283

## 2021-02-22 LAB — CBC WITH DIFFERENTIAL/PLATELET
Abs Immature Granulocytes: 0.23 10*3/uL — ABNORMAL HIGH (ref 0.00–0.07)
Basophils Absolute: 0 10*3/uL (ref 0.0–0.1)
Basophils Relative: 0 %
Eosinophils Absolute: 0.1 10*3/uL (ref 0.0–0.5)
Eosinophils Relative: 1 %
HCT: 21.4 % — ABNORMAL LOW (ref 36.0–46.0)
Hemoglobin: 6.9 g/dL — CL (ref 12.0–15.0)
Immature Granulocytes: 2 %
Lymphocytes Relative: 46 %
Lymphs Abs: 4.8 10*3/uL — ABNORMAL HIGH (ref 0.7–4.0)
MCH: 29.2 pg (ref 26.0–34.0)
MCHC: 32.2 g/dL (ref 30.0–36.0)
MCV: 90.7 fL (ref 80.0–100.0)
Monocytes Absolute: 1.1 10*3/uL — ABNORMAL HIGH (ref 0.1–1.0)
Monocytes Relative: 10 %
Neutro Abs: 4.4 10*3/uL (ref 1.7–7.7)
Neutrophils Relative %: 41 %
Platelets: 262 10*3/uL (ref 150–400)
RBC: 2.36 MIL/uL — ABNORMAL LOW (ref 3.87–5.11)
RDW: 14.6 % (ref 11.5–15.5)
WBC: 10.7 10*3/uL — ABNORMAL HIGH (ref 4.0–10.5)
nRBC: 0 % (ref 0.0–0.2)

## 2021-02-22 LAB — BASIC METABOLIC PANEL
Anion gap: 10 (ref 5–15)
BUN: 52 mg/dL — ABNORMAL HIGH (ref 6–20)
CO2: 23 mmol/L (ref 22–32)
Calcium: 7.5 mg/dL — ABNORMAL LOW (ref 8.9–10.3)
Chloride: 97 mmol/L — ABNORMAL LOW (ref 98–111)
Creatinine, Ser: 6.22 mg/dL — ABNORMAL HIGH (ref 0.44–1.00)
GFR, Estimated: 8 mL/min — ABNORMAL LOW (ref >60)
Glucose, Bld: 103 mg/dL — ABNORMAL HIGH (ref 70–99)
Potassium: 4.1 mmol/L (ref 3.5–5.1)
Sodium: 130 mmol/L — ABNORMAL LOW (ref 135–145)

## 2021-02-22 LAB — FERRITIN: Ferritin: 157 ng/mL (ref 11–307)

## 2021-02-22 LAB — IRON AND TIBC
Iron: 24 ug/dL — ABNORMAL LOW (ref 28–170)
Saturation Ratios: 9 % — ABNORMAL LOW (ref 10.4–31.8)
TIBC: 279 ug/dL (ref 250–450)
UIBC: 255 ug/dL

## 2021-02-22 LAB — PREPARE RBC (CROSSMATCH)

## 2021-02-22 IMAGING — XA IR FLUORO GUIDE CV LINE*R*
3 series · 6 of 6 positions shown · IV contrast (IODINE)
Comparison: none

INDICATION: 49-year-old female with acute kidney injury with need for long-term
hemodialysis.

[Series 1: ir fluoro guide cv line*right* · 1 of 1 slices shown]
[im 1/1]
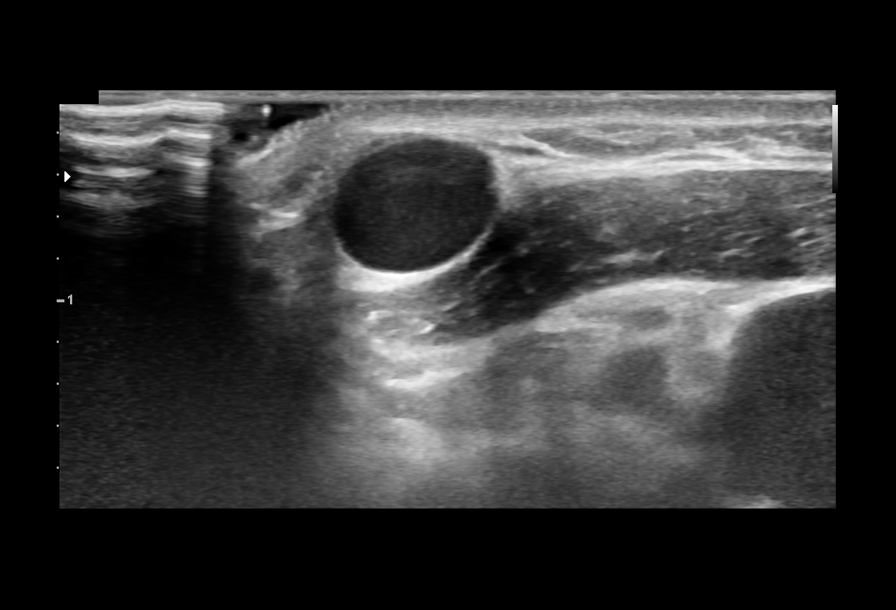

[Series 1: fl (-) angio · 1 of 1 slices shown]
[im 1/1]
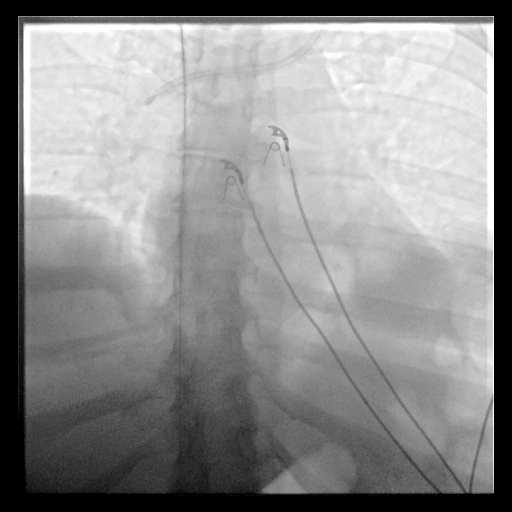

[Series 2: body 4 care · 2 acquisitions, 4 frames shown]
[im 1/2]
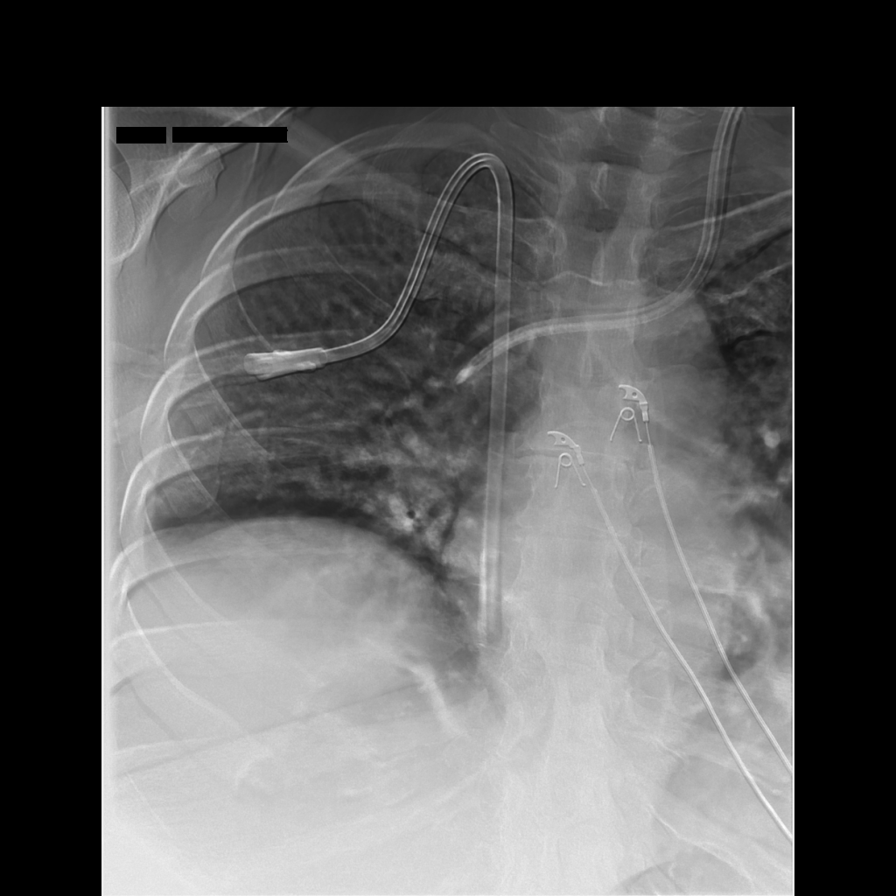
[im 1/2]
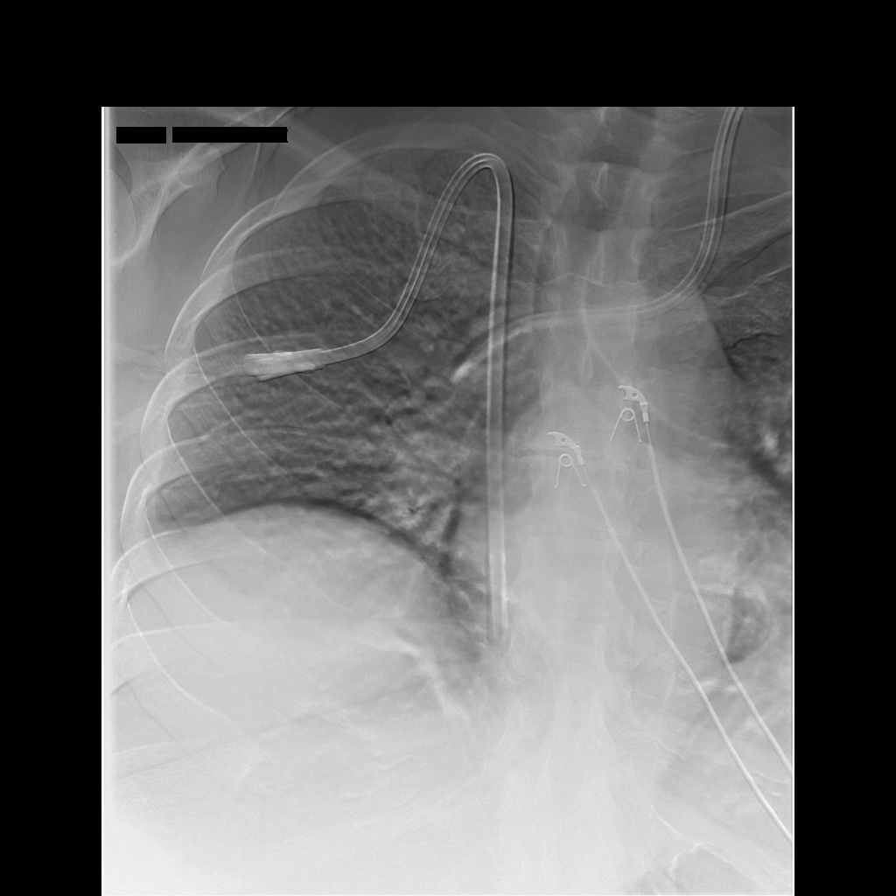
[im 1/2]
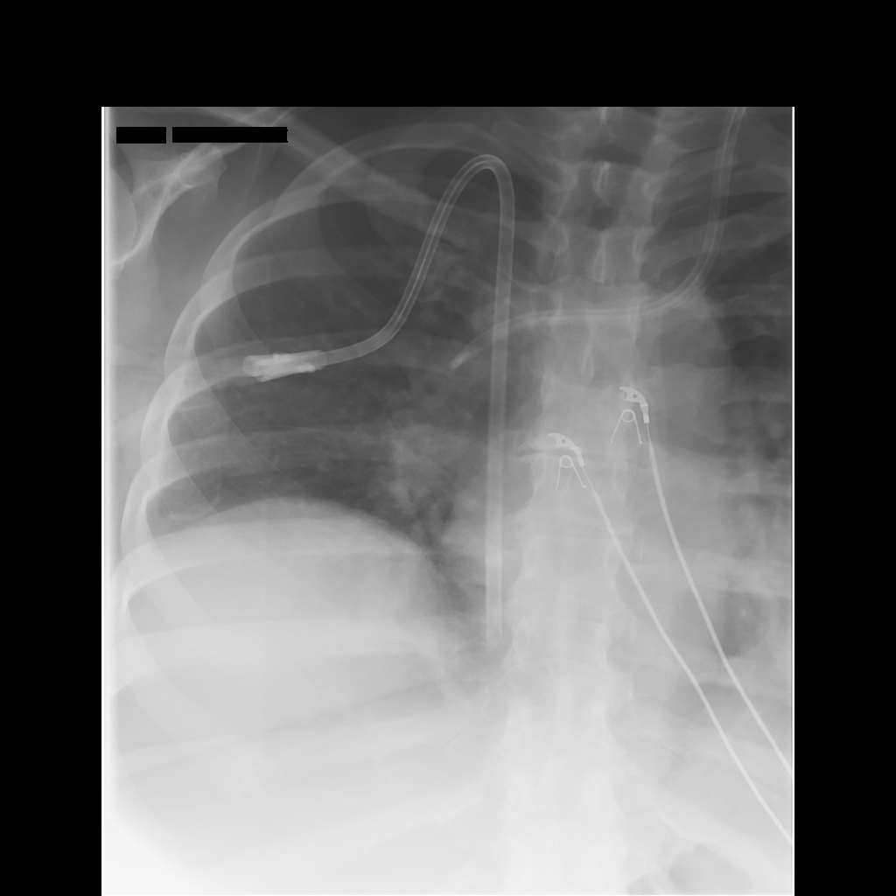
[im 2/2]
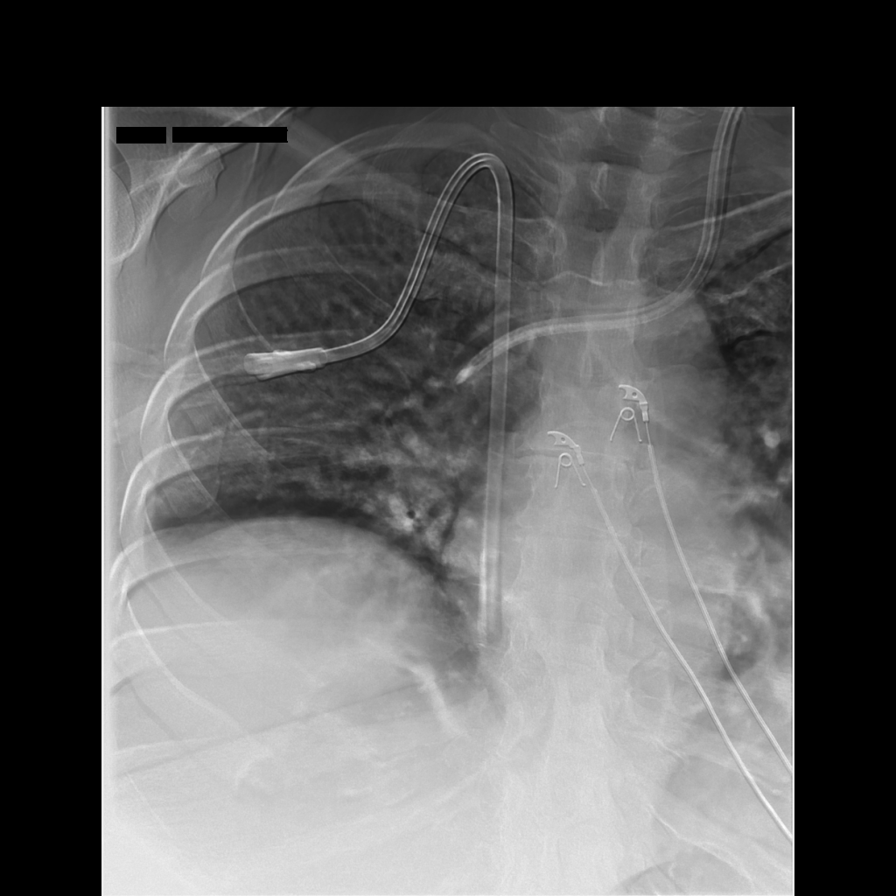

[6 of 6 positions shown; findings below may reference images not displayed]

EXAM:
TUNNELED CENTRAL VENOUS HEMODIALYSIS CATHETER PLACEMENT WITH
ULTRASOUND AND FLUOROSCOPIC GUIDANCE

MEDICATIONS:
Ancef 2 gm IV . The antibiotic was given in an appropriate time
interval prior to skin puncture.

ANESTHESIA/SEDATION:
Moderate (conscious) sedation was employed during this procedure. A
total of Versed 2 mg and Fentanyl 150 mcg was administered
intravenously.

Moderate Sedation Time: 16 minutes. The patient's level of
consciousness and vital signs were monitored continuously by
radiology nursing throughout the procedure under my direct
supervision.

FLUOROSCOPY TIME:  0 minutes 30 seconds (6 mGy).

COMPLICATIONS:
None immediate.



After creating a small venotomy incision, a 21 gauge micropuncture
kit was utilized to access the internal jugular vein. Real-time
ultrasound guidance was utilized for vascular access including the
acquisition of a permanent ultrasound image documenting patency of
the accessed vessel.

HOSEA wire was advanced to the level of the IVC and the
micropuncture sheath was exchanged for an 9 Fr dilator. A
French tunneled hemodialysis catheter measuring 23 cm from tip to
cuff was tunneled in a retrograde fashion from the anterior chest
wall to the venotomy incision. Serial dilation was then performed an
a peel-away sheath was placed.

The catheter was then placed through the peel-away sheath with the
catheter tip ultimately positioned within the right atrium. Final
catheter positioning was confirmed and documented with a spot
radiographic image. The catheter aspirates and flushes normally. The
catheter was flushed with appropriate volume heparin dwells.

The catheter exit site was secured with a 0-Prolene retention
suture. The venotomy incision was closed with Dermabond. Sterile
dressings were applied. The patient tolerated the procedure well
without immediate post procedural complication.
IMPRESSION: Successful placement of 23 cm tip to cuff tunneled hemodialysis
catheter via the right internal jugular vein with catheter tip
terminating within the right atrium. The catheter is ready for
immediate use.

## 2021-02-22 MED ORDER — HEPARIN SODIUM (PORCINE) 1000 UNIT/ML IJ SOLN
INTRAMUSCULAR | Status: AC
  Filled 2021-02-22: qty 1

## 2021-02-22 MED ORDER — FENTANYL CITRATE (PF) 100 MCG/2ML IJ SOLN
INTRAMUSCULAR | Status: AC | PRN
  Administered 2021-02-22 (×3): 50 ug via INTRAVENOUS

## 2021-02-22 MED ORDER — CEFAZOLIN (ANCEF) 1 G IV SOLR: 2.0000 g | INTRAVENOUS | Status: DC

## 2021-02-22 MED ORDER — LIDOCAINE-EPINEPHRINE 1 %-1:100000 IJ SOLN
INTRAMUSCULAR | Status: AC
  Filled 2021-02-22: qty 1

## 2021-02-22 MED ORDER — CEFAZOLIN SODIUM-DEXTROSE 2-4 GM/100ML-% IV SOLN
INTRAVENOUS | Status: AC
  Administered 2021-02-22: 2 g via INTRAVENOUS
  Filled 2021-02-22: qty 100

## 2021-02-22 MED ORDER — FENTANYL CITRATE (PF) 100 MCG/2ML IJ SOLN
INTRAMUSCULAR | Status: AC
  Filled 2021-02-22: qty 4

## 2021-02-22 MED ORDER — LIDOCAINE-EPINEPHRINE 1 %-1:100000 IJ SOLN
INTRAMUSCULAR | Status: AC | PRN
  Administered 2021-02-22: 10 mL

## 2021-02-22 MED ORDER — RENA-VITE PO TABS
1.0000 | ORAL_TABLET | Freq: Every day | ORAL | Status: DC
  Administered 2021-02-22 – 2021-03-06 (×13): 1 via ORAL
  Filled 2021-02-22 (×13): qty 1

## 2021-02-22 MED ORDER — ACETAMINOPHEN 325 MG PO TABS
650.0000 mg | ORAL_TABLET | Freq: Once | ORAL | Status: DC
  Filled 2021-02-22: qty 2

## 2021-02-22 MED ORDER — MIDAZOLAM HCL 2 MG/2ML IJ SOLN
INTRAMUSCULAR | Status: AC
  Filled 2021-02-22: qty 4

## 2021-02-22 MED ORDER — SODIUM CHLORIDE 0.9% IV SOLUTION: Freq: Once | INTRAVENOUS | Status: AC

## 2021-02-22 MED ORDER — MIDAZOLAM HCL 2 MG/2ML IJ SOLN
INTRAMUSCULAR | Status: AC | PRN
  Administered 2021-02-22 (×2): 1 mg via INTRAVENOUS

## 2021-02-22 MED ORDER — CEFAZOLIN SODIUM-DEXTROSE 2-4 GM/100ML-% IV SOLN
2.0000 g | INTRAVENOUS | Status: AC
  Administered 2021-02-22: 2 g via INTRAVENOUS

## 2021-02-22 MED ORDER — DIPHENHYDRAMINE HCL 50 MG/ML IJ SOLN: 25.0000 mg | Freq: Once | INTRAMUSCULAR | Status: DC

## 2021-02-22 NOTE — Progress Notes (Signed)
[facility] Progress Note  Subjective: pt seen and examined bedside.  Had RIJ [place] placed in IR this AM.    Vitals:   02/22/21 1050 02/22/21 1055 02/22/21 1100 02/22/21 1118  BP: 123/72 127/65 131/60 136/63  Pulse: 89 (!) 104 98 97  Resp: '19 18 17 17  '$ Temp:      TempSrc:      SpO2: 96% 93% 94% 93%  Weight:      Height:        Exam: Gen: nad, lying in bed CV: s1s2, rrr Resp: cta b/l Abd: soft Ext: 2+ edema bilateral LEs Neuro: awake, alert, speech clear and coherent Dialysis Access: Left IJ temp line and RIJ TDC    Assessment/ Plan: 1. AKI w/ CPK > 50,000 on admission. 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 She did have contrast on 3/17 so this may have hindered any chances of renal recovery thereafter (although benefits outweighed risks at the time of contrast administration). She is S/P CRRT 3/17- 3/23.  Now on iHD - 3/29 labs at pre HD encouraging for some improvement in renal function; she's also making more urine. I suspect with the degree of insults she sustained this will be a protracted recovery but will continue to assess daily for recovery. IV team c/s to remove temp HD catheter.  2. Hypertension/volume: BP's good, b/l LE's edematous, UF as tolerated w/ HD. Cont torsemide 100 daily.   3. Rhabdomyolysis: was down for > 6 hrs, peak CPK > 50K on admit 3/17.  Improved 4. Hyponatremia, hypervolemic: managing with HD, uf as tolerated 5. Abd pain: RUQ US showing gallbladder sludge. Gen surgery evaluated. ^LFT's are likely muscle enzymes associated w/ her rhabdo incident. Does have signs of choleycystitis on imaging--mgmt per primary 6. Substance abuse: her husband is deceased following the incident.  Spiritual care involved and psychiatry consulted  7. Anemia:  Hb 6.3 yesterday s/p 1 upRBC > 6.9 this AM.  Transfuse < 7 per primary.    Renee Hick MD [facility] Pager  [phone]      Recent Labs  Lab 02/17/21 [phone] 02/18/21 0102 02/19/21 0240 02/21/21 0839 02/22/21 0042  K 3.9 4.1   < > 4.2 4.1  BUN 49* 60*   < > 58* 52*  CREATININE 5.58* 6.83*   < > 6.30* 6.22*  CALCIUM 7.4* 7.4*   < > 7.4* 7.5*  PHOS 4.0 4.5  --   --   --   HGB 6.6* 8.5*   < > 6.3* 6.9*   < > = values in this interval not displayed.   Inpatient medications: . sodium chloride   Intravenous Once  . Chlorhexidine Gluconate Cloth  6 each Topical Daily  . famotidine  20 mg Oral Daily  . feeding supplement  237 mL Oral TID BM  . fentaNYL      . gabapentin  100 mg Oral BID  . heparin  5,000 Units Subcutaneous Q8H  . heparin sodium (porcine)      . lidocaine-EPINEPHrine      . mouth rinse  15 mL Mouth Rinse BID  . midazolam      . multivitamin  1 tablet Oral QHS  . OLANZapine  10 mg Oral QHS  . sertraline  50 mg Oral Daily  . sodium chloride flush  10-40 mL Intracatheter Q12H  . torsemide  100 mg Oral Daily  . traZODone  150 mg Oral QHS   .  ceFAZolin     ALPRAZolam, opium-belladonna, docusate, haloperidol lactate, HYDROmorphone (DILAUDID) injection, methocarbamol, ondansetron (ZOFRAN) IV, oxyCODONE, phenol, sodium chloride flush

## 2021-02-22 NOTE — Progress Notes (Signed)
Physical Therapy Treatment Patient Details Name: Renee Wilkinson MRN: [id number] DOB: [birth year] Today's Date: 02/22/2021    History of Present Illness Pt is a 50 yo female, admitted to [facility] on 3/17 after being found down (next to deceased spouse) after smoking crack cocaine and now with AKI, hyperkalemia, rhabdomyolysis, and leukocytosis.  Pt with hx of substance abuse.    PT Comments    Pt is making good progress towards her goals today. Pt continues to be limited in safe mobility by decreased L LE AROM, in presence of pain in L thigh with movement resulting in decreased balance and endurance. Pt is min guard for bed mobility and modAx2 for transfers, modAx2 for ambulation of 10 feet with RW. Pt is very happy with her progression of ambulation. PT continues to recommend CIR at discharge and will continue to follow acutely.    Follow Up Recommendations  CIR     Equipment Recommendations  None recommended by PT       Precautions / Restrictions Precautions Precautions: Fall Restrictions Weight Bearing Restrictions: No    Mobility  Bed Mobility Overal bed mobility: Needs Assistance Bed Mobility: Supine to Sit     Supine to sit: HOB elevated;Min guard     General bed mobility comments: min guard for safety, increased effort    Transfers Overall transfer level: Needs assistance Equipment used: Rolling walker (2 wheeled) Transfers: Sit to/from [facility] Sit to Stand: Mod assist;+2 physical assistance;From elevated surface;Max assist         General transfer comment: maxAx2 for coming to standing on first attempt, elevated bed and pt modAx2 for coming to standing, second time  Ambulation/Gait Ambulation/Gait assistance: Mod assist;+2 physical assistance (close chair follow) Gait Distance (Feet): 10 Feet Assistive device: Rolling walker (2 wheeled) Gait Pattern/deviations: Step-to pattern;Decreased step length - right;Decreased step length - left Gait  velocity: slowed Gait velocity interpretation: <1.31 ft/sec, indicative of household ambulator General Gait Details: modAx2 with close chair follow to walk from bed to door, multimodal cues for upright posture and looking up and out         Balance Overall balance assessment: Needs assistance Sitting-balance support: Feet supported;No upper extremity supported;Bilateral upper extremity supported;Single extremity supported Sitting balance-Leahy Scale: Fair     Standing balance support: Bilateral upper extremity supported;During functional activity Standing balance-Leahy Scale: Poor Standing balance comment: reliant on external assist                            Cognition Arousal/Alertness: Awake/alert Behavior During Therapy: WFL for tasks assessed/performed Overall Cognitive Status: No family/caregiver present to determine baseline cognitive functioning                                        Exercises General Exercises - Lower Extremity Heel Slides: AROM;AAROM;Left;10 reps;Supine    General Comments General comments (skin integrity, edema, etc.): VSS on RA      Pertinent Vitals/Pain Pain Assessment: Faces Faces Pain Scale: Hurts little more Pain Location: LLE, posterior aspect Pain Descriptors / Indicators: Grimacing;Guarding;Sore;Spasm;Other (Comment) ("feels like a charley horse") Pain Intervention(s): Limited activity within patient's tolerance;Monitored during session;Patient requesting pain meds-RN notified;RN gave pain meds during session           PT Goals (current goals can now be found in the care plan section) Acute Rehab PT Goals PT Goal Formulation:  With patient Time For Goal Achievement: 02/26/21 Potential to Achieve Goals: Good Progress towards PT goals: Progressing toward goals    Frequency    Min 3X/week      PT Plan Current plan remains appropriate    Co-evaluation              AM-PAC PT "6 Clicks"  Mobility   Outcome Measure  Help needed turning from your back to your side while in a flat bed without using bedrails?: A Lot Help needed moving from lying on your back to sitting on the side of a flat bed without using bedrails?: A Lot Help needed moving to and from a bed to a chair (including a wheelchair)?: A Lot Help needed standing up from a chair using your arms (e.g., wheelchair or bedside chair)?: A Lot Help needed to walk in hospital room?: A Lot Help needed climbing 3-5 steps with a railing? : Total 6 Click Score: 11    End of Session Equipment Utilized During Treatment: Gait belt Activity Tolerance: Patient limited by pain Patient left: with call bell/phone within reach;in chair;with chair alarm set;with nursing/sitter in room Nurse Communication: Mobility status PT Visit Diagnosis: Muscle weakness (generalized) (M62.81);Pain;History of falling (Z91.81);Difficulty in walking, not elsewhere classified (R26.2) Pain - Right/Left: Left Pain - part of body: Leg     Time: 1601-1630 PT Time Calculation (min) (ACUTE ONLY): 29 min  Charges:  $Gait Training: 8-22 mins $Therapeutic Exercise: 8-22 mins                     Briana Newman B. Migdalia Dk PT, DPT Acute Rehabilitation Services Pager [phone] Office [phone]     [place] 02/22/2021, 5:57 PM

## 2021-02-22 NOTE — Progress Notes (Signed)
PT Cancellation Note  Patient Details Name: Renee Wilkinson MRN: [id number] DOB: 04-06-1971   Cancelled Treatment:    Reason Eval/Treat Not Completed: (P) Patient at procedure or test/unavailable Pt is off floor. PT will follow back this afternoon as able.   Josy Peaden B. Migdalia Dk PT, DPT Acute Rehabilitation Services Pager [phone] Office [phone]    [place] 02/22/2021, 10:31 AM

## 2021-02-22 NOTE — Progress Notes (Addendum)
Nutrition Follow-up  DOCUMENTATION CODES:   Obesity unspecified  INTERVENTION:    Continue Ensure Enlive po TID, each supplement provides 350 kcal and 20 grams of protein.  Renal MVI daily.  NUTRITION DIAGNOSIS:   Increased nutrient needs related to acute illness (renal insufficiency requiring HD) as evidenced by estimated needs.  GOAL:   Patient will meet greater than or equal to 90% of their needs  MONITOR:   PO intake,Supplement acceptance,Labs  REASON FOR ASSESSMENT:   Rounds    ASSESSMENT:   50 year old female who presented to the ED on 3/17 with SOB and abdominal pain after being found down beside her husband who was deceased. PMH of polysubstance abuse, schizophrenia, depression, anxiety. Pt admitted with AKI, hyperkalemia, rhabdomyolysis, shock liver.   Patient required CRRT from 3/17 to 3/23. She is now receiving iHD, last treatment was 3/29.  S/P tunneled HD catheter placement this morning.  Currently on a renal diet with 1200 ml fluid restriction. Meal intakes: 75-100% Being offered Ensure Enlive/Plus TID, drinking 2 per day on average.  Admit weight: 108.1 kg Current weight: 120.4 kg Lowest weight: 105.7 kg  Labs reviewed. Na 130 Medications reviewed.  Diet Order:   Diet Order            Diet renal with fluid restriction Fluid restriction: 1200 mL Fluid; Room service appropriate? Yes; Fluid consistency: Thin  Diet effective now                 EDUCATION NEEDS:   No education needs have been identified at this time  Skin:  Skin Assessment: Skin Integrity Issues: DTI: left great toe Other: MASD to abdomen  Last BM:  3/28  Height:   Ht Readings from Last 1 Encounters:  02/09/21 '5\' 5"'$  (1.651 m)    Weight:   Wt Readings from Last 1 Encounters:  02/22/21 120.4 kg    BMI:  Body mass index is 44.17 kg/m.  Estimated Nutritional Needs:   Kcal:  2100-2300  Protein:  100-115 gm  Fluid:  1 L + UOP   Lucas Mallow, RD, LDN,  CNSC Please refer to Amion for contact information.

## 2021-02-22 NOTE — TOC Progression Note (Signed)
Transition of Care [facility]) - Progression Note    Patient Details  Name: Renee Wilkinson MRN: [id number] Date of Birth: 1971-05-09  Transition of Care [facility] CM/SW [place], LCSW Phone Number: 02/22/2021, 2:44 PM  Clinical Narrative:    CSW received notification from CIR that patient's insurance ([employer] [place]) will not pay for CIR in [place]. CSW was provided with contact info for [facility] program ([place]). CSW spoke with their admissions liaison and obtained fax number for referral once medically stable (f. [phone]). CSW spoke with patient's daughter, [name], who lives in [place]. She reported family is planning on getting patient back to [place] and is hopeful for their inpatient rehab program and patient will transition to stay with her mother and siblings after that. CSW made her aware that family would need to transport patient to [place] or pay for transport as the cost will not be covered by patient's insurance. CSW will continue to follow.    Expected Discharge Plan: IP Rehab Facility Barriers to Discharge: Continued Medical Work up,Inadequate or no insurance,Waiting for outpatient dialysis  Expected Discharge Plan and Services Expected Discharge Plan: [place] In-house Referral: Clinical Social Work Discharge Planning Services: CM Consult   Living arrangements for the past 2 months: Hotel/Motel                                       Social Determinants of Health (SDOH) Interventions    Readmission Risk Interventions No flowsheet data found.

## 2021-02-22 NOTE — Procedures (Signed)
Interventional Radiology Procedure Note  Procedure: Tunneled hemodialysis catheter placement  Findings: Please refer to procedural dictation for full description.  14.5 Fr, 23 cm right IJ tunneled HD catheter placed with tip in right atrium.  Complications: None immediate  Estimated Blood Loss: < 5 mL  Recommendations: Catheter ready for immediate use.   Ruthann Cancer, MD Pager: [phone]

## 2021-02-22 NOTE — Progress Notes (Addendum)
PROGRESS NOTE        PATIENT DETAILS Name: Renee Wilkinson Age: 50 y.o. Sex: female Date of Birth: March 18, 1971 Admit Date: 02/09/2021 Admitting Physician Lanier Clam, MD QQI:[id number], No Pcp Per (Inactive)  Brief Narrative: Patient is a 50 y.o. female with history of schizophrenia, depression/anxiety substance abuse-who was found down beside her husband (found dead by EMS)-upon presentation to the ED she was found to have acute toxic encephalopathy due to drug overdose-along with AKI, hyperkalemia, shock liver, CT imaging positive for renal infarcts-patient was intubated and admitted to the ICU.  She was started on CRRT.  Upon further stability-she was transferred to the [facility] service on 3/25.  Significant events: 3/17>> admit to ICU-intubated-started CRRT 3/25>> transferred to [facility]  Significant studies: 3/17>> UDS: Negative 3/17>> CT head: No acute abnormalities. 3/17>> CT angio chest/abdomen: No aorticdissection,b/l renal infarcts 3/18>> RUQ ultrasound: Cholelithiasis 3/19>> Echo: EF 60-65% 3/21>> RUQ ultrasound: Cholelithiasis and sludge-no evidence of acute cholecystitis. 3/21>> MRI C-spine: Myositis-right posterior paraspinous muscles without fluid collection. 3/21>> MRI T-spine: Myositis involving posterior paraspinous muscle 3/21>> MRI L-spine: Right posterior muscle edema throughout lumbar spine 3/23>> renal ultrasound: No hydronephrosis 3/20>> CT C-spine: No bone/disc space infection 3/20>> CT LS spine: No spinal infection 3/20>> CT abdomen/pelvis: High-grade SBO transition point in the ventral abdominal wall hernia. 3/22>> CT abdomen/pelvis: Persistent dilated small bowel loops-consistent with partial SBO. 3/22>> HIDA scan: Delayed gallbladder filling-patency of cystic duct. 3/24>> left lower extremity Doppler: No DVT  Antimicrobial therapy: See below  Microbiology data: 3/17>> blood culture: No growth x5 days  Procedures : 3/17>> R IJ  HD cath 3/17 ETT>> 3/17 3/30>> tunneled HD catheter placement  Consults: CCM, nephrology, neurology, orthopedics, psychiatry, general surgery  DVT Prophylaxis : heparin injection 5,000 Units Start: 02/14/21 2200   Subjective: Lying comfortably in bed-acknowledges ongoing pain in the left lower extremity-but at the same time acknowledges that pain-and the weakness in the left lower leg is better.  She is able to move her leg side-to-side but still not able to elevate against gravity.   Assessment/Plan: AKI: Multifactorial etiology-due to rhabdomyolysis, contrast-induced nephropathy, bilateral renal infarcts-underwent CRRT from 3/17-3/23-now on intermittent HD.  Nephrology following and directing care.  Acute toxic encephalopathy: Due to drug overdose-mental status is much better.  Shock liver: Improving  Acute hypoxic respiratory failure: Intubated on admission-currently stable on room air.  Small bowel obstruction: Treated with conservative measures-has resolved.   Chronic cholecystitis: No evidence for acute cystitis-see imaging studies above.  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  Normocytic anemia: Probably due to acute illness-AKI-no evidence of blood loss-transfuse 1 unit of PRBC.  Drug overdose  Bereavement/grief/schizophrenia/depression: Evaluated by psychiatry-continue Zyprexa-remains on trazodone/Zoloft-safety sitter at bedside.  Morbid Obesity: Estimated body mass index is 44.17 kg/m as calculated from the following:   Height as of this encounter: '5\' 5"'  (1.651 m).   Weight as of this encounter: 120.4 kg.     Diet: Diet Order            Diet renal with fluid restriction Fluid restriction: 1200 mL Fluid; Room service appropriate? Yes; Fluid consistency: Thin  Diet  effective now  Code Status: Full code  Family Communication: None at bedside   Disposition Plan: Status is: Inpatient  Remains inpatient appropriate because:Inpatient level of care appropriate due to severity of illness   Dispo: The patient is from: Home              Anticipated d/c is to: CIR              Patient currently is not medically stable to d/c.   Difficult to place patient No   Barriers to Discharge: Renal failure requiring intermittent HD  Antimicrobial agents: Anti-infectives (From admission, onward)   Start     Dose/Rate Route Frequency Ordered Stop   02/22/21 1015  ceFAZolin (ANCEF) powder 2 g  Status:  Discontinued        2 g Other To Surgery 02/22/21 0922 02/22/21 0945   02/22/21 1000  ceFAZolin (ANCEF) IVPB 2g/100 mL premix        2 g 200 mL/hr over 30 Minutes Intravenous To Radiology 02/22/21 0945 02/22/21 1250   02/16/21 1000  piperacillin-tazobactam (ZOSYN) IVPB 2.25 g  Status:  Discontinued        2.25 g 100 mL/hr over 30 Minutes Intravenous Every 8 hours 02/16/21 0720 02/17/21 1047   02/13/21 1400  piperacillin-tazobactam (ZOSYN) IVPB 3.375 g  Status:  Discontinued        3.375 g 12.5 mL/hr over 240 Minutes Intravenous Every 8 hours 02/13/21 0826 02/16/21 0720   02/13/21 0815  piperacillin-tazobactam (ZOSYN) IVPB 3.375 g        3.375 g 100 mL/hr over 30 Minutes Intravenous  Once 02/13/21 0733 02/13/21 0820   02/09/21 1015  ceFEPIme (MAXIPIME) 2 g in sodium chloride 0.9 % 100 mL IVPB        2 g 200 mL/hr over 30 Minutes Intravenous  Once 02/09/21 1010 02/09/21 1112   02/09/21 1015  vancomycin (VANCOCIN) IVPB 1000 mg/200 mL premix        1,000 mg 200 mL/hr over 60 Minutes Intravenous  Once 02/09/21 1010 02/09/21 1901       Time spent: 25- minutes-Greater than 50% of this time was spent in counseling, explanation of diagnosis, planning of further management, and coordination of care.  MEDICATIONS: Scheduled Meds: . sodium  chloride   Intravenous Once  . Chlorhexidine Gluconate Cloth  6 each Topical Daily  . famotidine  20 mg Oral Daily  . feeding supplement  237 mL Oral TID BM  . gabapentin  100 mg Oral BID  . heparin  5,000 Units Subcutaneous Q8H  . heparin sodium (porcine)      . mouth rinse  15 mL Mouth Rinse BID  . multivitamin  1 tablet Oral QHS  . OLANZapine  10 mg Oral QHS  . sertraline  50 mg Oral Daily  . sodium chloride flush  10-40 mL Intracatheter Q12H  . torsemide  100 mg Oral Daily  . traZODone  150 mg Oral QHS   Continuous Infusions: PRN Meds:.ALPRAZolam, opium-belladonna, docusate, haloperidol lactate, HYDROmorphone (DILAUDID) injection, methocarbamol, ondansetron (ZOFRAN) IV, oxyCODONE, phenol, sodium chloride flush   PHYSICAL EXAM: Vital signs: Vitals:   02/22/21 1050 02/22/21 1055 02/22/21 1100 02/22/21 1118  BP: 123/72 127/65 131/60 136/63  Pulse: 89 (!) 104 98 97  Resp: '19 18 17 17  ' Temp:      TempSrc:      SpO2: 96% 93% 94% 93%  Weight:      Height:       Filed Weights   02/21/21  0500 02/21/21 1130 02/22/21 0353  Weight: 120.6 kg 115.8 kg 120.4 kg   Body mass index is 44.17 kg/m.   Gen Exam:Alert awake-not in any distress HEENT:atraumatic, normocephalic Chest: B/L clear to auscultation anteriorly CVS:S1S2 regular Abdomen:soft non tender, non distended Extremities:no edema Neurology: Non focal Skin: no rash  I have personally reviewed following labs and imaging studies  LABORATORY DATA: CBC: Recent Labs  Lab 02/18/21 0102 02/19/21 0240 02/20/21 0921 02/21/21 0839 02/22/21 0042  WBC 15.1* 14.1* 13.5* 12.0* 10.7*  NEUTROABS 7.6 6.8 6.4 6.8 4.4  HGB 8.5* 7.2* 7.2* 6.3* 6.9*  HCT 24.3* 21.3* 21.2* 19.6* 21.4*  MCV 86.5 87.7 88.3 90.3 90.7  PLT 231 229 284 274 902    Basic Metabolic Panel: Recent Labs  Lab 02/16/21 0450 02/17/21 0336 02/18/21 0102 02/19/21 0240 02/20/21 0921 02/21/21 0839 02/22/21 0042  NA 134* 129* 129* 131* 130* 132* 130*   K 4.1 3.9 4.1 4.3 4.8 4.2 4.1  CL 103 100 100 100 99 99 97*  CO2 21* 20* 19* 21* 21* 24 23  GLUCOSE 94 109* 122* 114* 96 97 103*  BUN 38* 49* 60* 56* 70* 58* 52*  CREATININE 4.35* 5.58* 6.83* 6.75* 8.24* 6.30* 6.22*  CALCIUM 7.5* 7.4* 7.4* 7.4* 7.6* 7.4* 7.5*  MG  --  2.3 2.2  --   --   --   --   PHOS 3.8 4.0 4.5  --   --   --   --     GFR: Estimated Creatinine Clearance: 14.1 mL/min (A) (by C-G formula based on SCr of 6.22 mg/dL (H)).  Liver Function Tests: Recent Labs  Lab 02/16/21 0450 02/17/21 0336 02/18/21 0102  AST  --   --  103*  ALT  --   --  86*  ALKPHOS  --   --  96  BILITOT  --   --  0.8  PROT  --   --  5.2*  ALBUMIN 1.6* 1.6* 1.8*   No results for input(s): LIPASE, AMYLASE in the last 168 hours. No results for input(s): AMMONIA in the last 168 hours.  Coagulation Profile: No results for input(s): INR, PROTIME in the last 168 hours.  Cardiac Enzymes: No results for input(s): CKTOTAL, CKMB, CKMBINDEX, TROPONINI in the last 168 hours.  BNP (last 3 results) No results for input(s): PROBNP in the last 8760 hours.  Lipid Profile: No results for input(s): CHOL, HDL, LDLCALC, TRIG, CHOLHDL, LDLDIRECT in the last 72 hours.  Thyroid Function Tests: No results for input(s): TSH, T4TOTAL, FREET4, T3FREE, THYROIDAB in the last 72 hours.  Anemia Panel: No results for input(s): VITAMINB12, FOLATE, FERRITIN, TIBC, IRON, RETICCTPCT in the last 72 hours.  Urine analysis:    Component Value Date/Time   COLORURINE AMBER (A) 02/15/2021 1708   APPEARANCEUR TURBID (A) 02/15/2021 1708   LABSPEC 1.024 02/15/2021 1708   PHURINE 5.0 02/15/2021 1708   GLUCOSEU 50 (A) 02/15/2021 1708   HGBUR LARGE (A) 02/15/2021 1708   BILIRUBINUR NEGATIVE 02/15/2021 1708   KETONESUR 5 (A) 02/15/2021 1708   PROTEINUR 100 (A) 02/15/2021 1708   NITRITE NEGATIVE 02/15/2021 1708   LEUKOCYTESUR MODERATE (A) 02/15/2021 1708    Sepsis Labs: Lactic Acid, Venous    Component Value Date/Time    LATICACIDVEN 4.0 (HH) 02/09/2021 1108    MICROBIOLOGY: Recent Results (from the past 240 hour(s))  MRSA PCR Screening     Status: Abnormal   Collection Time: 02/16/21  4:50 AM   Specimen: Nasopharyngeal  Result  Value Ref Range Status   MRSA by PCR POSITIVE (A) NEGATIVE Final    Comment:        The GeneXpert MRSA Assay (FDA approved for NASAL specimens only), is one component of a comprehensive MRSA colonization surveillance program. It is not intended to diagnose MRSA infection nor to guide or monitor treatment for MRSA infections. RESULT CALLED TO, READ BACK BY AND VERIFIED WITH: RN K.FRPOUST AT 0830 ON 02/16/2021 BY T.SAAD Performed at [facility], [place] [address], [place], [place] [address]     RADIOLOGY STUDIES/RESULTS: IR Fluoro Guide CV Line Right  Result Date: 02/22/2021 INDICATION: 49 year old female with acute kidney injury with need for long-term hemodialysis. EXAM: TUNNELED CENTRAL VENOUS HEMODIALYSIS CATHETER PLACEMENT WITH ULTRASOUND AND FLUOROSCOPIC GUIDANCE MEDICATIONS: Ancef 2 gm IV . The antibiotic was given in an appropriate time interval prior to skin puncture. ANESTHESIA/SEDATION: Moderate (conscious) sedation was employed during this procedure. A total of Versed 2 mg and Fentanyl 150 mcg was administered intravenously. Moderate Sedation Time: 16 minutes. The patient's level of consciousness and vital signs were monitored continuously by radiology nursing throughout the procedure under my direct supervision. FLUOROSCOPY TIME:  0 minutes 30 seconds (6 mGy). COMPLICATIONS: None immediate. PROCEDURE: Informed written consent was obtained from the patient after a discussion of the risks, benefits, and alternatives to treatment. Questions regarding the procedure were encouraged and answered. The right neck and chest were prepped with chlorhexidine in a sterile fashion, and a sterile drape was applied covering the operative field. Maximum barrier sterile technique  with sterile gowns and gloves were used for the procedure. A timeout was performed prior to the initiation of the procedure. After creating a small venotomy incision, a 21 gauge micropuncture kit was utilized to access the internal jugular vein. Real-time ultrasound guidance was utilized for vascular access including the acquisition of a permanent ultrasound image documenting patency of the accessed vessel. A Wholey wire was advanced to the level of the IVC and the micropuncture sheath was exchanged for an 9 Fr dilator. A 14.5 French tunneled hemodialysis catheter measuring 23 cm from tip to cuff was tunneled in a retrograde fashion from the anterior chest wall to the venotomy incision. Serial dilation was then performed an a peel-away sheath was placed. The catheter was then placed through the peel-away sheath with the catheter tip ultimately positioned within the right atrium. Final catheter positioning was confirmed and documented with a spot radiographic image. The catheter aspirates and flushes normally. The catheter was flushed with appropriate volume heparin dwells. The catheter exit site was secured with a 0-Prolene retention suture. The venotomy incision was closed with Dermabond. Sterile dressings were applied. The patient tolerated the procedure well without immediate post procedural complication. IMPRESSION: Successful placement of 23 cm tip to cuff tunneled hemodialysis catheter via the right internal jugular vein with catheter tip terminating within the right atrium. The catheter is ready for immediate use. Ruthann Cancer, MD [facility] Radiology Electronically Signed   By: Ruthann Cancer MD   On: 02/22/2021 11:42   IR US Guide Vasc Access Right  Result Date: 02/22/2021 INDICATION: 50 year old female with acute kidney injury with need for long-term hemodialysis. EXAM: TUNNELED CENTRAL VENOUS HEMODIALYSIS CATHETER PLACEMENT WITH ULTRASOUND AND  FLUOROSCOPIC GUIDANCE MEDICATIONS: Ancef 2 gm IV . The antibiotic was given in an appropriate time interval prior to skin puncture. ANESTHESIA/SEDATION: Moderate (conscious) sedation was employed during this procedure. A total of Versed 2 mg and Fentanyl 150 mcg  was administered intravenously. Moderate Sedation Time: 16 minutes. The patient's level of consciousness and vital signs were monitored continuously by radiology nursing throughout the procedure under my direct supervision. FLUOROSCOPY TIME:  0 minutes 30 seconds (6 mGy). COMPLICATIONS: None immediate. PROCEDURE: Informed written consent was obtained from the patient after a discussion of the risks, benefits, and alternatives to treatment. Questions regarding the procedure were encouraged and answered. The right neck and chest were prepped with chlorhexidine in a sterile fashion, and a sterile drape was applied covering the operative field. Maximum barrier sterile technique with sterile gowns and gloves were used for the procedure. A timeout was performed prior to the initiation of the procedure. After creating a small venotomy incision, a 21 gauge micropuncture kit was utilized to access the internal jugular vein. Real-time ultrasound guidance was utilized for vascular access including the acquisition of a permanent ultrasound image documenting patency of the accessed vessel. A Wholey wire was advanced to the level of the IVC and the micropuncture sheath was exchanged for an 9 Fr dilator. A 14.5 French tunneled hemodialysis catheter measuring 23 cm from tip to cuff was tunneled in a retrograde fashion from the anterior chest wall to the venotomy incision. Serial dilation was then performed an a peel-away sheath was placed. The catheter was then placed through the peel-away sheath with the catheter tip ultimately positioned within the right atrium. Final catheter positioning was confirmed and documented with a spot radiographic image. The catheter aspirates  and flushes normally. The catheter was flushed with appropriate volume heparin dwells. The catheter exit site was secured with a 0-Prolene retention suture. The venotomy incision was closed with Dermabond. Sterile dressings were applied. The patient tolerated the procedure well without immediate post procedural complication. IMPRESSION: Successful placement of 23 cm tip to cuff tunneled hemodialysis catheter via the right internal jugular vein with catheter tip terminating within the right atrium. The catheter is ready for immediate use. Ruthann Cancer, MD [facility] Radiology Electronically Signed   By: Ruthann Cancer MD   On: 02/22/2021 11:42     LOS: 7 days   Oren Binet, MD  [facility]    To contact the attending provider between 7A-7P or the covering provider during after hours 7P-7A, please log into the web site [email or web address] and access using universal [place] password for that web site. If you do not have the password, please call the hospital operator.  02/22/2021, 3:18 PM

## 2021-02-23 DIAGNOSIS — F259 Schizoaffective disorder, unspecified: Secondary | ICD-10-CM | POA: Diagnosis not present

## 2021-02-23 DIAGNOSIS — K56609 Unspecified intestinal obstruction, unspecified as to partial versus complete obstruction: Secondary | ICD-10-CM | POA: Diagnosis not present

## 2021-02-23 DIAGNOSIS — N179 Acute kidney failure, unspecified: Secondary | ICD-10-CM | POA: Diagnosis not present

## 2021-02-23 LAB — RENAL FUNCTION PANEL
Albumin: 1.8 g/dL — ABNORMAL LOW (ref 3.5–5.0)
Anion gap: 10 (ref 5–15)
BUN: 62 mg/dL — ABNORMAL HIGH (ref 6–20)
CO2: 22 mmol/L (ref 22–32)
Calcium: 7.7 mg/dL — ABNORMAL LOW (ref 8.9–10.3)
Chloride: 97 mmol/L — ABNORMAL LOW (ref 98–111)
Creatinine, Ser: 7.07 mg/dL — ABNORMAL HIGH (ref 0.44–1.00)
GFR, Estimated: 7 mL/min — ABNORMAL LOW (ref >60)
Glucose, Bld: 104 mg/dL — ABNORMAL HIGH (ref 70–99)
Phosphorus: 6.3 mg/dL — ABNORMAL HIGH (ref 2.5–4.6)
Potassium: 4.2 mmol/L (ref 3.5–5.1)
Sodium: 129 mmol/L — ABNORMAL LOW (ref 135–145)

## 2021-02-23 LAB — BPAM RBC
Blood Product Expiration Date: 202204262359
Blood Product Expiration Date: 202204272359
ISSUE DATE / TIME: 202203291332
ISSUE DATE / TIME: 202203301745
Unit Type and Rh: 5100
Unit Type and Rh: 5100

## 2021-02-23 LAB — TYPE AND SCREEN
ABO/RH(D): O POS
Antibody Screen: NEGATIVE
Unit division: 0
Unit division: 0

## 2021-02-23 LAB — CBC
HCT: 22.7 % — ABNORMAL LOW (ref 36.0–46.0)
Hemoglobin: 7.5 g/dL — ABNORMAL LOW (ref 12.0–15.0)
MCH: 29.6 pg (ref 26.0–34.0)
MCHC: 33 g/dL (ref 30.0–36.0)
MCV: 89.7 fL (ref 80.0–100.0)
Platelets: 265 10*3/uL (ref 150–400)
RBC: 2.53 MIL/uL — ABNORMAL LOW (ref 3.87–5.11)
RDW: 14.5 % (ref 11.5–15.5)
WBC: 11.6 10*3/uL — ABNORMAL HIGH (ref 4.0–10.5)
nRBC: 0 % (ref 0.0–0.2)

## 2021-02-23 MED ORDER — SODIUM CHLORIDE 0.9 % IV SOLN: 100.0000 mL | INTRAVENOUS | Status: DC | PRN

## 2021-02-23 MED ORDER — LIDOCAINE HCL (PF) 1 % IJ SOLN: 5.0000 mL | INTRAMUSCULAR | Status: DC | PRN

## 2021-02-23 MED ORDER — HEPARIN SODIUM (PORCINE) 1000 UNIT/ML IJ SOLN
INTRAMUSCULAR | Status: AC
  Administered 2021-02-23: 1000 [IU]
  Filled 2021-02-23: qty 4

## 2021-02-23 MED ORDER — ALTEPLASE 2 MG IJ SOLR: 2.0000 mg | Freq: Once | INTRAMUSCULAR | Status: DC | PRN

## 2021-02-23 MED ORDER — HYDROMORPHONE HCL 1 MG/ML IJ SOLN
INTRAMUSCULAR | Status: AC
  Filled 2021-02-23: qty 0.5

## 2021-02-23 MED ORDER — LIDOCAINE-PRILOCAINE 2.5-2.5 % EX CREA: 1.0000 "application " | TOPICAL_CREAM | CUTANEOUS | Status: DC | PRN

## 2021-02-23 MED ORDER — HEPARIN SODIUM (PORCINE) 1000 UNIT/ML DIALYSIS: 1000.0000 [IU] | INTRAMUSCULAR | Status: DC | PRN

## 2021-02-23 MED ORDER — PENTAFLUOROPROP-TETRAFLUOROETH EX AERO: 1.0000 "application " | INHALATION_SPRAY | CUTANEOUS | Status: DC | PRN

## 2021-02-23 NOTE — Progress Notes (Signed)
[facility] Progress Note  Subjective: pt seen and examined in dialysis.  Qb 400 with new TDC, temp HD cath out.  Regaining a bit of movement in L foot now. No new issues.  UOP up to 2.1L yesterday   Vitals:   02/23/21 0002 02/23/21 0324 02/23/21 0325 02/23/21 0750  BP: (!) 175/75  139/78 126/76  Pulse: 96  99 (!) 101  Resp: 17  (!) 21 (!) 22  Temp: 98.9 F (37.2 C)  98.9 F (37.2 C) 99.1 F (37.3 C)  TempSrc: Axillary  Axillary Oral  SpO2: 93%  96% 93%  Weight:  119.4 kg    Height:        Exam: Gen: nad, lying in bed CV: s1s2, rrr Resp: cta b/l Abd: soft Ext: 2+ edema bilateral LEs Neuro: awake, alert, speech clear and coherent; can dorsiflex L foot some now Dialysis Access:  RIJ TDC c/d/i    Assessment/ Plan: 1. AKI w/ CPK > 50,000 on admission. 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 She did have contrast on 3/17 so this may have hindered any chances of renal recovery thereafter (although benefits outweighed risks at the time of contrast administration). She is S/P CRRT 3/17- 3/23.  Now on iHD. I suspect with the degree of insults she sustained this will be a protracted recovery but will continue to assess daily for recovery.  Her increasing UOP is encouraging.  2. Hypertension/volume: BP's good, b/l LE's edematous, UF as tolerated w/ HD. Cont torsemide 100 daily.   3. Rhabdomyolysis: was down for > 6 hrs, peak CPK > 50K on admit 3/17.  Improved 4. Hyponatremia, hypervolemic: managing with HD, uf as tolerated 5. Substance abuse: her husband is deceased following the incident.  Spiritual care involved and psychiatry consulted  6. Anemia:  Hb 7.5.  Transfuse < 7 per primary.    Jannifer Hick MD [facility] Pager [phone]      Recent Labs  Lab 02/18/21 0102 02/19/21 0240 02/22/21 0042 02/23/21 0521  K 4.1   < > 4.1 4.2  BUN 60*   < > 52* 62*  CREATININE 6.83*   < >  6.22* 7.07*  CALCIUM 7.4*   < > 7.5* 7.7*  PHOS 4.5  --   --  6.3*  HGB 8.5*   < > 6.9* 7.5*   < > = values in this interval not displayed.   Inpatient medications: . sodium chloride   Intravenous Once  . acetaminophen  650 mg Oral Once  . Chlorhexidine Gluconate Cloth  6 each Topical Daily  . diphenhydrAMINE  25 mg Intravenous Once  . famotidine  20 mg Oral Daily  . feeding supplement  237 mL Oral TID BM  . gabapentin  100 mg Oral BID  . heparin  5,000 Units Subcutaneous Q8H  . mouth rinse  15 mL Mouth Rinse BID  . multivitamin  1 tablet Oral QHS  . OLANZapine  10 mg Oral QHS  . sertraline  50 mg Oral Daily  . sodium chloride flush  10-40 mL Intracatheter Q12H  . torsemide  100 mg Oral Daily  . traZODone  150 mg Oral QHS    ALPRAZolam, opium-belladonna, docusate, haloperidol lactate, HYDROmorphone (DILAUDID) injection, methocarbamol, ondansetron (ZOFRAN) IV, oxyCODONE, phenol, sodium chloride flush

## 2021-02-23 NOTE — Progress Notes (Signed)
OT Cancellation Note  Patient Details Name: Renee Wilkinson MRN: [id number] DOB: 04-19-1971   Cancelled Treatment:    Reason Eval/Treat Not Completed: Patient at procedure or test/ unavailable. Pt off unit for HD this AM. Will follow-up for OT eval once pt returns to unit.  Layla Maw 02/23/2021, 7:58 AM

## 2021-02-23 NOTE — Progress Notes (Signed)
PROGRESS NOTE        PATIENT DETAILS Name: Renee Wilkinson Age: 50 y.o. Sex: female Date of Birth: 06/15/1971 Admit Date: 02/09/2021 Admitting Physician Lanier Clam, MD [id number], No Pcp Per (Inactive)  Brief Narrative: Patient is a 50 y.o. female with history of schizophrenia, depression/anxiety substance abuse-who was found down beside her husband (found dead by EMS)-upon presentation to the ED she was found to have acute toxic encephalopathy due to drug overdose-along with AKI, hyperkalemia, shock liver, CT imaging positive for renal infarcts-patient was intubated and admitted to the ICU.  She was started on CRRT.  Upon further stability-she was transferred to the [facility] service on 3/25.  Significant events: 3/17>> admit to ICU-intubated-started CRRT 3/25>> transferred to [facility]  Significant studies: 3/17>> UDS: Negative 3/17>> CT head: No acute abnormalities. 3/17>> CT angio chest/abdomen: No aorticdissection,b/l renal infarcts 3/18>> RUQ ultrasound: Cholelithiasis 3/19>> Echo: EF 60-65% 3/21>> RUQ ultrasound: Cholelithiasis and sludge-no evidence of acute cholecystitis. 3/21>> MRI C-spine: Myositis-right posterior paraspinous muscles without fluid collection. 3/21>> MRI T-spine: Myositis involving posterior paraspinous muscle 3/21>> MRI L-spine: Right posterior muscle edema throughout lumbar spine 3/23>> renal ultrasound: No hydronephrosis 3/20>> CT C-spine: No bone/disc space infection 3/20>> CT LS spine: No spinal infection 3/20>> CT abdomen/pelvis: High-grade SBO transition point in the ventral abdominal wall hernia. 3/22>> CT abdomen/pelvis: Persistent dilated small bowel loops-consistent with partial SBO. 3/22>> HIDA scan: Delayed gallbladder filling-patency of cystic duct. 3/24>> left lower extremity Doppler: No DVT  Antimicrobial therapy: See below  Microbiology data: 3/17>> blood culture: No growth x5 days  Procedures : 3/17>> R IJ  HD cath 3/17 ETT>> 3/17 3/30>> tunneled HD catheter placement  Consults: CCM, nephrology, neurology, orthopedics, psychiatry, general surgery  DVT Prophylaxis : heparin injection 5,000 Units Start: 02/14/21 2200   Subjective: No major issues overnight-thinks her left leg pain is somewhat better-she is easily able to move her left leg side-to-side-and thinks she is now able to just about lifted off the bed as well.  Claims her mood is fine this morning.   Assessment/Plan: AKI: Multifactorial etiology-due to rhabdomyolysis, contrast-induced nephropathy, bilateral renal infarcts-underwent CRRT from 3/17-3/23-now on intermittent HD.  Nephrology following and directing care.  Continue to monitor for signs of renal recovery.  Acute toxic encephalopathy: Due to drug overdose-mental status is much better.  Shock liver: Improving  Acute hypoxic respiratory failure: Intubated on admission-currently stable on room air.  Small bowel obstruction: Treated with conservative measures-has resolved.   Chronic cholecystitis: No evidence for acute cystitis-see imaging studies above.  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  Normocytic anemia: Probably due to acute illness-AKI-no evidence of blood loss-CBC stable at 7.5-received 1 unit of PRBC transfusion on 3/30.  Hyponatremia: Mild-probably will improve post HD-recheck electrolytes tomorrow.  Drug overdose  Bereavement/grief/schizophrenia/depression: Evaluated by psychiatry-continue Zyprexa-remains on trazodone/Zoloft-safety sitter at bedside.  Morbid Obesity: Estimated body mass index is 42.67 kg/m as calculated from the following:   Height as of this encounter: 5' 5" (1.651 m).   Weight as of this encounter: 116.3 kg.     Diet: Diet Order             Diet renal with fluid restriction Fluid restriction: 1200 mL Fluid; Room service appropriate?  Yes; Fluid consistency: Thin  Diet effective now                  Code Status: Full code  Family Communication: None at bedside   Disposition Plan: Status is: Inpatient  Remains inpatient appropriate because:Inpatient level of care appropriate due to severity of illness   Dispo: The patient is from: Home              Anticipated d/c is to: CIR              Patient currently is not medically stable to d/c.   Difficult to place patient No   Barriers to Discharge: Renal failure requiring intermittent HD  Antimicrobial agents: Anti-infectives (From admission, onward)   Start     Dose/Rate Route Frequency Ordered Stop   02/22/21 1015  ceFAZolin (ANCEF) powder 2 g  Status:  Discontinued        2 g Other To Surgery 02/22/21 0922 02/22/21 0945   02/22/21 1000  ceFAZolin (ANCEF) IVPB 2g/100 mL premix        2 g 200 mL/hr over 30 Minutes Intravenous To Radiology 02/22/21 0945 02/22/21 1250   02/16/21 1000  piperacillin-tazobactam (ZOSYN) IVPB 2.25 g  Status:  Discontinued        2.25 g 100 mL/hr over 30 Minutes Intravenous Every 8 hours 02/16/21 0720 02/17/21 1047   02/13/21 1400  piperacillin-tazobactam (ZOSYN) IVPB 3.375 g  Status:  Discontinued        3.375 g 12.5 mL/hr over 240 Minutes Intravenous Every 8 hours 02/13/21 0826 02/16/21 0720   02/13/21 0815  piperacillin-tazobactam (ZOSYN) IVPB 3.375 g        3.375 g 100 mL/hr over 30 Minutes Intravenous  Once 02/13/21 0733 02/13/21 0820   02/09/21 1015  ceFEPIme (MAXIPIME) 2 g in sodium chloride 0.9 % 100 mL IVPB        2 g 200 mL/hr over 30 Minutes Intravenous  Once 02/09/21 1010 02/09/21 1112   02/09/21 1015  vancomycin (VANCOCIN) IVPB 1000 mg/200 mL premix        1,000 mg 200 mL/hr over 60 Minutes Intravenous  Once 02/09/21 1010 02/09/21 1901       Time spent: 25- minutes-Greater than 50% of this time was spent  in counseling, explanation of diagnosis, planning of further management, and coordination of care.  MEDICATIONS: Scheduled Meds: . sodium chloride   Intravenous Once  . acetaminophen  650 mg Oral Once  . Chlorhexidine Gluconate Cloth  6 each Topical Daily  . diphenhydrAMINE  25 mg Intravenous Once  . famotidine  20 mg Oral Daily  . feeding supplement  237 mL Oral TID BM  . gabapentin  100 mg Oral BID  . heparin  5,000 Units Subcutaneous Q8H  . mouth rinse  15 mL Mouth Rinse BID  . multivitamin  1 tablet Oral QHS  . OLANZapine  10 mg Oral QHS  . sertraline  50 mg Oral Daily  . sodium chloride flush  10-40 mL Intracatheter Q12H  . torsemide  100 mg Oral Daily  . traZODone  150 mg Oral QHS   Continuous Infusions: PRN Meds:.ALPRAZolam, opium-belladonna, docusate, haloperidol lactate, HYDROmorphone (DILAUDID) injection, methocarbamol, ondansetron (ZOFRAN) IV, oxyCODONE, phenol, sodium chloride flush   PHYSICAL EXAM: Vital signs: Vitals:   02/23/21 1000 02/23/21 1030 02/23/21 1100 02/23/21 1155  BP: 115/79 130/88 130/84 131/76  Pulse:    91  Resp:    18  Temp:    99  F (37.2 C)  TempSrc:    Oral  SpO2:    93%  Weight:    116.3 kg  Height:       Filed Weights   02/23/21 0324 02/23/21 0745 02/23/21 1155  Weight: 119.4 kg 120 kg 116.3 kg   Body mass index is 42.67 kg/m.   Gen Exam:Alert awake-not in any distress HEENT:atraumatic, normocephalic Chest: B/L clear to auscultation anteriorly CVS:S1S2 regular Abdomen:soft non tender, non distended Extremities:no edema Neurology: Left leg-easily moves side-to-side-this morning just about able to lift it off the bed against gravity. Skin: no rash  I have personally reviewed following labs and imaging studies  LABORATORY DATA: CBC: Recent Labs  Lab 02/18/21 0102 02/19/21 0240 02/20/21 0921 02/21/21 0839 02/22/21 0042 02/23/21 0521  WBC 15.1* 14.1* 13.5* 12.0* 10.7* 11.6*  NEUTROABS 7.6 6.8 6.4 6.8 4.4  --   HGB 8.5*  7.2* 7.2* 6.3* 6.9* 7.5*  HCT 24.3* 21.3* 21.2* 19.6* 21.4* 22.7*  MCV 86.5 87.7 88.3 90.3 90.7 89.7  PLT 231 229 284 274 262 802    Basic Metabolic Panel: Recent Labs  Lab 02/17/21 0336 02/18/21 0102 02/19/21 0240 02/20/21 0921 02/21/21 0839 02/22/21 0042 02/23/21 0521  NA 129* 129* 131* 130* 132* 130* 129*  K 3.9 4.1 4.3 4.8 4.2 4.1 4.2  CL 100 100 100 99 99 97* 97*  CO2 20* 19* 21* 21* _0 GLUCOSE 109* 122* 114* 96 97 103* 104*  BUN 49* 60* 56* 70* 58* 52* 62*  CREATININE 5.58* 6.83* 6.75* 8.24* 6.30* 6.22* 7.07*  CALCIUM 7.4* 7.4* 7.4* 7.6* 7.4* 7.5* 7.7*  MG 2.3 2.2  --   --   --   --   --   PHOS 4.0 4.5  --   --   --   --  6.3*    GFR: Estimated Creatinine Clearance: 12.1 mL/min (A) (by C-G formula based on SCr of 7.07 mg/dL (H)).  Liver Function Tests: Recent Labs  Lab 02/17/21 0336 02/18/21 0102 02/23/21 0521  AST  --  103*  --   ALT  --  86*  --   ALKPHOS  --  96  --   BILITOT  --  0.8  --   PROT  --  5.2*  --   ALBUMIN 1.6* 1.8* 1.8*   No results for input(s): LIPASE, AMYLASE in the last 168 hours. No results for input(s): AMMONIA in the last 168 hours.  Coagulation Profile: No results for input(s): INR, PROTIME in the last 168 hours.  Cardiac Enzymes: No results for input(s): CKTOTAL, CKMB, CKMBINDEX, TROPONINI in the last 168 hours.  BNP (last 3 results) No results for input(s): PROBNP in the last 8760 hours.  Lipid Profile: No results for input(s): CHOL, HDL, LDLCALC, TRIG, CHOLHDL, LDLDIRECT in the last 72 hours.  Thyroid Function Tests: No results for input(s): TSH, T4TOTAL, FREET4, T3FREE, THYROIDAB in the last 72 hours.  Anemia Panel: Recent Labs    02/22/21 1519  FERRITIN 157  TIBC 279  IRON 24*    Urine analysis:    Component Value Date/Time   COLORURINE AMBER (A) 02/15/2021 1708   APPEARANCEUR TURBID (A) 02/15/2021 1708   LABSPEC 1.024 02/15/2021 1708   PHURINE 5.0 02/15/2021 1708   GLUCOSEU 50 (A) 02/15/2021 1708    HGBUR LARGE (A) 02/15/2021 1708   BILIRUBINUR NEGATIVE 02/15/2021 1708   KETONESUR 5 (A) 02/15/2021 1708   PROTEINUR 100 (A) 02/15/2021 1708   NITRITE NEGATIVE 02/15/2021 1708  LEUKOCYTESUR MODERATE (A) 02/15/2021 1708    Sepsis Labs: Lactic Acid, Venous    Component Value Date/Time   LATICACIDVEN 4.0 (HH) 02/09/2021 1108    MICROBIOLOGY: Recent Results (from the past 240 hour(s))  MRSA PCR Screening     Status: Abnormal   Collection Time: 02/16/21  4:50 AM   Specimen: Nasopharyngeal  Result Value Ref Range Status   MRSA by PCR POSITIVE (A) NEGATIVE Final    Comment:        The GeneXpert MRSA Assay (FDA approved for NASAL specimens only), is one component of a comprehensive MRSA colonization surveillance program. It is not intended to diagnose MRSA infection nor to guide or monitor treatment for MRSA infections. RESULT CALLED TO, READ BACK BY AND VERIFIED WITH: RN K.FRPOUST AT 0830 ON 02/16/2021 BY T.SAAD Performed at [facility], [place] [address], [place], [place] [address]     RADIOLOGY STUDIES/RESULTS: IR Fluoro Guide CV Line Right  Result Date: 02/22/2021 INDICATION: 50 year old female with acute kidney injury with need for long-term hemodialysis. EXAM: TUNNELED CENTRAL VENOUS HEMODIALYSIS CATHETER PLACEMENT WITH ULTRASOUND AND FLUOROSCOPIC GUIDANCE MEDICATIONS: Ancef 2 gm IV . The antibiotic was given in an appropriate time interval prior to skin puncture. ANESTHESIA/SEDATION: Moderate (conscious) sedation was employed during this procedure. A total of Versed 2 mg and Fentanyl 150 mcg was administered intravenously. Moderate Sedation Time: 16 minutes. The patient's level of consciousness and vital signs were monitored continuously by radiology nursing throughout the procedure under my direct supervision. FLUOROSCOPY TIME:  0 minutes 30 seconds (6 mGy). COMPLICATIONS: None immediate. PROCEDURE: Informed written consent was obtained from the patient after a  discussion of the risks, benefits, and alternatives to treatment. Questions regarding the procedure were encouraged and answered. The right neck and chest were prepped with chlorhexidine in a sterile fashion, and a sterile drape was applied covering the operative field. Maximum barrier sterile technique with sterile gowns and gloves were used for the procedure. A timeout was performed prior to the initiation of the procedure. After creating a small venotomy incision, a 21 gauge micropuncture kit was utilized to access the internal jugular vein. Real-time ultrasound guidance was utilized for vascular access including the acquisition of a permanent ultrasound image documenting patency of the accessed vessel. A Wholey wire was advanced to the level of the IVC and the micropuncture sheath was exchanged for an 9 Fr dilator. A 14.5 French tunneled hemodialysis catheter measuring 23 cm from tip to cuff was tunneled in a retrograde fashion from the anterior chest wall to the venotomy incision. Serial dilation was then performed an a peel-away sheath was placed. The catheter was then placed through the peel-away sheath with the catheter tip ultimately positioned within the right atrium. Final catheter positioning was confirmed and documented with a spot radiographic image. The catheter aspirates and flushes normally. The catheter was flushed with appropriate volume heparin dwells. The catheter exit site was secured with a 0-Prolene retention suture. The venotomy incision was closed with Dermabond. Sterile dressings were applied. The patient tolerated the procedure well without immediate post procedural complication. IMPRESSION: Successful placement of 23 cm tip to cuff tunneled hemodialysis catheter via the right internal jugular vein with catheter tip terminating within the right atrium. The catheter is ready for immediate use. Ruthann Cancer, MD [facility] Radiology  Electronically Signed   By: Ruthann Cancer MD   On: 02/22/2021 11:42   IR US Guide Vasc Access Right  Result Date: 02/22/2021 INDICATION:  50 year old female with acute kidney injury with need for long-term hemodialysis. EXAM: TUNNELED CENTRAL VENOUS HEMODIALYSIS CATHETER PLACEMENT WITH ULTRASOUND AND FLUOROSCOPIC GUIDANCE MEDICATIONS: Ancef 2 gm IV . The antibiotic was given in an appropriate time interval prior to skin puncture. ANESTHESIA/SEDATION: Moderate (conscious) sedation was employed during this procedure. A total of Versed 2 mg and Fentanyl 150 mcg was administered intravenously. Moderate Sedation Time: 16 minutes. The patient's level of consciousness and vital signs were monitored continuously by radiology nursing throughout the procedure under my direct supervision. FLUOROSCOPY TIME:  0 minutes 30 seconds (6 mGy). COMPLICATIONS: None immediate. PROCEDURE: Informed written consent was obtained from the patient after a discussion of the risks, benefits, and alternatives to treatment. Questions regarding the procedure were encouraged and answered. The right neck and chest were prepped with chlorhexidine in a sterile fashion, and a sterile drape was applied covering the operative field. Maximum barrier sterile technique with sterile gowns and gloves were used for the procedure. A timeout was performed prior to the initiation of the procedure. After creating a small venotomy incision, a 21 gauge micropuncture kit was utilized to access the internal jugular vein. Real-time ultrasound guidance was utilized for vascular access including the acquisition of a permanent ultrasound image documenting patency of the accessed vessel. A Wholey wire was advanced to the level of the IVC and the micropuncture sheath was exchanged for an 9 Fr dilator. A 14.5 French tunneled hemodialysis catheter measuring 23 cm from tip to cuff was tunneled in a retrograde fashion from the anterior chest wall to the venotomy incision.  Serial dilation was then performed an a peel-away sheath was placed. The catheter was then placed through the peel-away sheath with the catheter tip ultimately positioned within the right atrium. Final catheter positioning was confirmed and documented with a spot radiographic image. The catheter aspirates and flushes normally. The catheter was flushed with appropriate volume heparin dwells. The catheter exit site was secured with a 0-Prolene retention suture. The venotomy incision was closed with Dermabond. Sterile dressings were applied. The patient tolerated the procedure well without immediate post procedural complication. IMPRESSION: Successful placement of 23 cm tip to cuff tunneled hemodialysis catheter via the right internal jugular vein with catheter tip terminating within the right atrium. The catheter is ready for immediate use. Ruthann Cancer, MD [facility] Radiology Electronically Signed   By: Ruthann Cancer MD   On: 02/22/2021 11:42     LOS: 12 days   Oren Binet, MD  [facility]    To contact the attending provider between 7A-7P or the covering provider during after hours 7P-7A, please log into the web site [email or web address] and access using universal [place] password for that web site. If you do not have the password, please call the hospital operator.  02/23/2021, 1:34 PM

## 2021-02-23 NOTE — TOC Progression Note (Signed)
Transition of Care [facility]) - Progression Note    Patient Details  Name: Renee Wilkinson MRN: [id number] Date of Birth: Feb 13, 1971  Transition of Care [facility]) CM/SW [place], LCSW Phone Number: 02/23/2021, 4:11 PM  Clinical Narrative:    CSW faxed referral to [facility] f. [phone].    Expected Discharge Plan: IP Rehab Facility Barriers to Discharge: Continued Medical Work up,Inadequate or no insurance,Waiting for outpatient dialysis  Expected Discharge Plan and Services Expected Discharge Plan: [place] In-house Referral: Clinical Social Work Discharge Planning Services: CM Consult   Living arrangements for the past 2 months: Hotel/Motel                                       Social Determinants of Health (SDOH) Interventions    Readmission Risk Interventions No flowsheet data found.

## 2021-02-23 NOTE — Progress Notes (Signed)
IP rehab admissions - patient does not have out of state benefits for an inpatient rehab admission.  My partner has made [place] with SW aware of the above.  Please see Nadia's note from 3/30.  I will not plan to admit to [facility] since patient would not have a payer source.  Call me for questions.  [phone]

## 2021-02-23 NOTE — Evaluation (Signed)
Occupational Therapy Evaluation Patient Details Name: Renee Wilkinson MRN: [id number] DOB: Jul 29, 1971 Today's Date: 02/23/2021    History of Present Illness Pt is a 50 yo female, admitted to [facility] on 3/17 after being found down (next to deceased spouse) after smoking crack cocaine and now with AKI, hyperkalemia, rhabdomyolysis, and leukocytosis.  Pt with hx of substance abuse.   Clinical Impression    PTA, pt typically independent in all ADLs, IADLs and mobility without AD. Pt presents now with deficits in pain, endurance, standing balance, and strength. Pt primarily limited by pain, impaired sensation, and weakness of L LE. Pt endorsed fatigue after dialysis but agreeable for OOB activities. Pt overall min guard for bed mobility, Mod A for sit to stand and Mod A for pivot to recliner using RW. Pt requires Min A for UB ADLs and up to Max A for LB ADLs due to deficits. Began education on energy conservation strategies, as well as compensatory strategies for LB ADLs with reinforcement needed. Encouraged continued ROM, movement and manual massage of L LE to normalize sensations. Based on high PLOF, potential for progress, recommend inpatient rehab for pt to maximize independence.    Follow Up Recommendations  CIR;Supervision/Assistance - 24 hour    Equipment Recommendations  3 in 1 bedside commode;Other (comment) (Rolling walker)    Recommendations for Other Services       Precautions / Restrictions Precautions Precautions: Fall Restrictions Weight Bearing Restrictions: No      Mobility Bed Mobility Overal bed mobility: Needs Assistance Bed Mobility: Supine to Sit     Supine to sit: HOB elevated;Min guard     General bed mobility comments: min guard for assist in elevation of L LE in advancement to EOB, increased time/effort    Transfers Overall transfer level: Needs assistance Equipment used: Rolling walker (2 wheeled) Transfers: Sit to/from [facility] Sit to  Stand: Mod assist Stand pivot transfers: Mod assist       General transfer comment: Mod A for sit to stand, increased time to achieve full upright position with RW. Mod A for sequencing/advancing RW to chair. Cued to push through UE more to offload L LE as needed    Balance Overall balance assessment: Needs assistance Sitting-balance support: Feet supported;No upper extremity supported;Bilateral upper extremity supported;Single extremity supported Sitting balance-Leahy Scale: Fair     Standing balance support: Bilateral upper extremity supported;During functional activity Standing balance-Leahy Scale: Poor Standing balance comment: reliant on external assist                           ADL either performed or assessed with clinical judgement   ADL Overall ADL's : Needs assistance/impaired Eating/Feeding: Independent;Sitting   Grooming: Set up;Sitting;Wash/dry face   Upper Body Bathing: Minimal assistance;Sitting   Lower Body Bathing: Maximal assistance;Sitting/lateral leans;Sit to/from stand   Upper Body Dressing : Minimal assistance;Sitting   Lower Body Dressing: Maximal assistance;Sitting/lateral leans;Sit to/from stand;Bed level   Toilet Transfer: Moderate assistance;Stand-pivot;RW Toilet Transfer Details (indicate cue type and reason): simulated to recliner Toileting- Clothing Manipulation and Hygiene: Maximal assistance;Sit to/from stand;Sitting/lateral lean         General ADL Comments: Limited by L LE pain/altered sensations primarily though noted deficits in balance and endurance. Began education on energy conservation with new dialysis, as well as pain mgmt strategies. Encouraged pt to perform ROM, stretching and manual massage on L LE to assist in desensitizing and improving sensation/use     Vision Baseline Vision/History:  No visual deficits Patient Visual Report: No change from baseline Vision Assessment?: No apparent visual deficits     Perception      Praxis      Pertinent Vitals/Pain Pain Assessment: 0-10 Pain Score: 9  Pain Location: L LE Pain Descriptors / Indicators: Guarding;Sore;Spasm;Numbness Pain Intervention(s): Monitored during session;Patient requesting pain meds-RN notified;Limited activity within patient's tolerance;Repositioned     Hand Dominance Right   Extremity/Trunk Assessment Upper Extremity Assessment Upper Extremity Assessment: Generalized weakness   Lower Extremity Assessment Lower Extremity Assessment: Defer to PT evaluation   Cervical / Trunk Assessment Cervical / Trunk Assessment: Normal   Communication Communication Communication: No difficulties   Cognition Arousal/Alertness: Awake/alert Behavior During Therapy: WFL for tasks assessed/performed;Anxious Overall Cognitive Status: No family/caregiver present to determine baseline cognitive functioning Area of Impairment: Attention;Safety/judgement;Awareness;Problem solving                   Current Attention Level: Selective     Safety/Judgement: Decreased awareness of deficits;Decreased awareness of safety Awareness: Emergent Problem Solving: Difficulty sequencing;Requires verbal cues General Comments: Pt follows all directions, a bit anxious with movement and benefits from encouragement and breakdown of tasks. Perseverates on L LE pain and altered sensations   General Comments  VSS on RA    Exercises     Shoulder Instructions      Home Living Family/patient expects to be discharged to:: Unsure Living Arrangements: Alone Available Help at Discharge: Family Type of Home: Other(Comment) (extended stay motel) Home Access: Level entry     Home Layout: One level     Bathroom Shower/Tub: Teacher, early years/pre: Standard Bathroom Accessibility: Yes How Accessible: Accessible via walker        Lives With: Spouse (deceased during event that led to admission)    Prior Functioning/Environment Level of  Independence: Independent        Comments: Pt states used no assistive device but it was difficult to get around bc of abdominal pain. Independent in ADLs/mobility        OT Problem List: Decreased strength;Decreased activity tolerance;Impaired balance (sitting and/or standing);Decreased knowledge of use of DME or AE;Decreased safety awareness;Pain;Impaired sensation      OT Treatment/Interventions: Self-care/ADL training;Therapeutic exercise;DME and/or AE instruction;Energy conservation;Therapeutic activities;Balance training;Patient/family education    OT Goals(Current goals can be found in the care plan section) Acute Rehab OT Goals Patient Stated Goal: pain control, feel better OT Goal Formulation: With patient Time For Goal Achievement: 03/09/21 Potential to Achieve Goals: Good ADL Goals Pt Will Perform Grooming: with supervision;standing Pt Will Perform Lower Body Bathing: sitting/lateral leans;sit to/from stand;with adaptive equipment;with min assist Pt Will Perform Lower Body Dressing: with min assist;with adaptive equipment;sitting/lateral leans;sit to/from stand Pt Will Transfer to Toilet: with supervision;ambulating Pt Will Perform Toileting - Clothing Manipulation and hygiene: with supervision;sitting/lateral leans;sit to/from stand Additional ADL Goal #1: Pt to verbalize at least 3 energy conservation strategies to implement during ADLs, IADls and mobility  OT Frequency: Min 2X/week   Barriers to D/C:            Co-evaluation              AM-PAC OT "6 Clicks" Daily Activity     Outcome Measure Help from another person eating meals?: None Help from another person taking care of personal grooming?: A Little Help from another person toileting, which includes using toliet, bedpan, or urinal?: A Lot Help from another person bathing (including washing, rinsing, drying)?: A Lot Help from another person  to put on and taking off regular upper body clothing?: A  Little Help from another person to put on and taking off regular lower body clothing?: A Lot 6 Click Score: 16   End of Session Equipment Utilized During Treatment: Gait belt;Rolling walker Nurse Communication: Mobility status;Patient requests pain meds  Activity Tolerance: Patient tolerated treatment well;Patient limited by pain Patient left: in chair;with call bell/phone within reach;with nursing/sitter in room (with sitter present)  OT Visit Diagnosis: Unsteadiness on feet (R26.81);Other abnormalities of gait and mobility (R26.89);Muscle weakness (generalized) (M62.81);Pain Pain - Right/Left: Left Pain - part of body: Leg                Time: [id number] OT Time Calculation (min): 25 min Charges:  OT General Charges $OT Visit: 1 Visit OT Evaluation $OT Eval Moderate Complexity: 1 Mod OT Treatments $Therapeutic Activity: 8-22 mins  Malachy Chamber, OTR/L Acute Rehab Services Office: [phone]  Layla Maw 02/23/2021, 2:40 PM

## 2021-02-24 DIAGNOSIS — N179 Acute kidney failure, unspecified: Secondary | ICD-10-CM | POA: Diagnosis not present

## 2021-02-24 LAB — CBC
HCT: 23.8 % — ABNORMAL LOW (ref 36.0–46.0)
Hemoglobin: 8 g/dL — ABNORMAL LOW (ref 12.0–15.0)
MCH: 30.2 pg (ref 26.0–34.0)
MCHC: 33.6 g/dL (ref 30.0–36.0)
MCV: 89.8 fL (ref 80.0–100.0)
Platelets: 293 10*3/uL (ref 150–400)
RBC: 2.65 MIL/uL — ABNORMAL LOW (ref 3.87–5.11)
RDW: 14.4 % (ref 11.5–15.5)
WBC: 12.5 10*3/uL — ABNORMAL HIGH (ref 4.0–10.5)
nRBC: 0 % (ref 0.0–0.2)

## 2021-02-24 LAB — RENAL FUNCTION PANEL
Albumin: 2.1 g/dL — ABNORMAL LOW (ref 3.5–5.0)
Anion gap: 9 (ref 5–15)
BUN: 32 mg/dL — ABNORMAL HIGH (ref 6–20)
CO2: 28 mmol/L (ref 22–32)
Calcium: 7.7 mg/dL — ABNORMAL LOW (ref 8.9–10.3)
Chloride: 95 mmol/L — ABNORMAL LOW (ref 98–111)
Creatinine, Ser: 4.35 mg/dL — ABNORMAL HIGH (ref 0.44–1.00)
GFR, Estimated: 12 mL/min — ABNORMAL LOW (ref >60)
Glucose, Bld: 118 mg/dL — ABNORMAL HIGH (ref 70–99)
Phosphorus: 4.1 mg/dL (ref 2.5–4.6)
Potassium: 3.6 mmol/L (ref 3.5–5.1)
Sodium: 132 mmol/L — ABNORMAL LOW (ref 135–145)

## 2021-02-24 LAB — RETICULOCYTES
Immature Retic Fract: 27.5 % — ABNORMAL HIGH (ref 2.3–15.9)
RBC.: 2.7 MIL/uL — ABNORMAL LOW (ref 3.87–5.11)
Retic Count, Absolute: 67.5 10*3/uL (ref 19.0–186.0)
Retic Ct Pct: 2.5 % (ref 0.4–3.1)

## 2021-02-24 NOTE — Progress Notes (Signed)
[facility] Progress Note  Subjective: pt seen and examined in room.  UF with HD 3.5L yesterday.  UOP 449m so far today. No new issues   Vitals:   02/23/21 1155 02/24/21 0402 02/24/21 0839 02/24/21 1216  BP: 131/76 (!) 121/58 130/70   Pulse: 91 100    Resp: 18 20    Temp: 99 F (37.2 C)  98.1 F (36.7 C) 98.6 F (37 C)  TempSrc: Oral  Axillary Oral  SpO2: 93% 92%    Weight: 116.3 kg     Height:        Exam: Gen: nad, lying in bed CV: s1s2, rrr Resp: cta b/l Abd: soft Ext: 2+ edema bilateral LEs - improving Neuro: awake, alert, speech clear and coherent; can dorsiflex L foot some now Dialysis Access:  RIJ TDC c/d/i    Assessment/ Plan: 1. AKI w/ CPK > 50,000 on admission. 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 She did have contrast on 3/17 so this may have hindered any chances of renal recovery thereafter (although benefits outweighed risks at the time of contrast administration). She is S/P CRRT 3/17- 3/23.  Now on iHD. I suspect with the degree of insults she sustained this will be a protracted recovery but will continue to assess daily for recovery.  Her increasing UOP is encouraging.  2. Hypertension/volume: BP's good, b/l LE's edematous, UF as tolerated w/ HD. Cont torsemide 100 daily.   3. Rhabdomyolysis: was down for > 6 hrs, peak CPK > 50K on admit 3/17.  Improved 4. Hyponatremia, hypervolemic: managing with HD, uf as tolerated 5. Substance abuse: her husband is deceased following the incident.  Spiritual care involved and psychiatry consulted  6. Anemia:  Hb 7.5.  Transfuse < 7 per primary.    LJannifer HickMD [facility] Pager [phone]     Recent Labs  Lab 02/23/21 [phone]/01/22 0116  K 4.2 3.6  BUN 62* 32*  CREATININE 7.07* 4.35*  CALCIUM 7.7* 7.7*  PHOS 6.3* 4.1  HGB 7.5* 8.0*   Inpatient medications: . sodium chloride   Intravenous Once  .  acetaminophen  650 mg Oral Once  . Chlorhexidine Gluconate Cloth  6 each Topical Daily  . diphenhydrAMINE  25 mg Intravenous Once  . famotidine  20 mg Oral Daily  . feeding supplement  237 mL Oral TID BM  . gabapentin  100 mg Oral BID  . heparin  5,000 Units Subcutaneous Q8H  . mouth rinse  15 mL Mouth Rinse BID  . multivitamin  1 tablet Oral QHS  . OLANZapine  10 mg Oral QHS  . sertraline  50 mg Oral Daily  . sodium chloride flush  10-40 mL Intracatheter Q12H  . torsemide  100 mg Oral Daily  . traZODone  150 mg Oral QHS    ALPRAZolam, opium-belladonna, docusate, haloperidol lactate, HYDROmorphone (DILAUDID) injection, methocarbamol, ondansetron (ZOFRAN) IV, oxyCODONE, phenol, sodium chloride flush

## 2021-02-24 NOTE — TOC Progression Note (Addendum)
Transition of Care [facility]) - Progression Note    Patient Details  Name: Renee Wilkinson MRN: [id number] Date of Birth: 05/22/1971  Transition of [facility] CM/SW [place], LCSW Phone Number: 02/24/2021, 9:35 AM  Clinical Narrative:    9:35am-CSW awaiting response from [facility] in [place].   10am-CSW received call from [place] with [facility]. She has declined the referral due to patient's substance use history. She states it will be difficult to place patient anywhere in [place] but will speak with her colleague to see if any other options are available with patient's insurance and call CSW back.   3pm-CIR reviewing patient for private pay.  4:30pm-Per CIR, they are requesting CSW contact other IR programs in [place] prior to them making a decision on patient. CSW faxed referral to [facility] [phone]) and [facility] [phone]) in [place], [place].    Expected Discharge Plan: IP Rehab Facility Barriers to Discharge: Continued Medical Work up,Inadequate or no insurance,Active Substance Use - Placement,No SNF bed  Expected Discharge Plan and Services Expected Discharge Plan: [place] In-house Referral: Clinical Social Work Discharge Planning Services: CM Consult   Living arrangements for the past 2 months: Hotel/Motel                                       Social Determinants of Health (SDOH) Interventions    Readmission Risk Interventions No flowsheet data found.

## 2021-02-24 NOTE — Progress Notes (Addendum)
PROGRESS NOTE        PATIENT DETAILS Name: Renee Wilkinson Age: 50 y.o. Sex: female Date of Birth: 1971-09-17 Admit Date: 02/09/2021 Admitting Physician Lanier Clam, MD [id number], No Pcp Per (Inactive)  Brief Narrative: Patient is a 50 y.o. female with history of schizophrenia, depression/anxiety substance abuse-who was found down beside her husband (found dead by EMS)-upon presentation to the ED she was found to have acute toxic encephalopathy due to drug overdose-along with AKI, hyperkalemia, shock liver, CT imaging positive for renal infarcts-patient was intubated and admitted to the ICU.  She was started on CRRT.  Upon further stability-she was transferred to the [facility] service on 3/25.  Significant events: 3/17>> admit to ICU-intubated-started CRRT 3/25>> transferred to [facility]  Significant studies: 3/17>> UDS: Negative 3/17>> CT head: No acute abnormalities. 3/17>> CT angio chest/abdomen: No aorticdissection,b/l renal infarcts 3/18>> RUQ ultrasound: Cholelithiasis 3/19>> Echo: EF 60-65% 3/21>> RUQ ultrasound: Cholelithiasis and sludge-no evidence of acute cholecystitis. 3/21>> MRI C-spine: Myositis-right posterior paraspinous muscles without fluid collection. 3/21>> MRI T-spine: Myositis involving posterior paraspinous muscle 3/21>> MRI L-spine: Right posterior muscle edema throughout lumbar spine 3/23>> renal ultrasound: No hydronephrosis 3/20>> CT C-spine: No bone/disc space infection 3/20>> CT LS spine: No spinal infection 3/20>> CT abdomen/pelvis: High-grade SBO transition point in the ventral abdominal wall hernia. 3/22>> CT abdomen/pelvis: Persistent dilated small bowel loops-consistent with partial SBO. 3/22>> HIDA scan: Delayed gallbladder filling-patency of cystic duct. 3/24>> left lower extremity Doppler: No DVT  Antimicrobial therapy: See below  Microbiology data: 3/17>> blood culture: No growth x5 days  Procedures : 3/17>> R IJ  HD cath 3/17 ETT>> 3/17 3/30>> tunneled HD catheter placement  Consults: CCM, nephrology, neurology, orthopedics, psychiatry, general surgery  DVT Prophylaxis : heparin injection 5,000 Units Start: 02/14/21 2200   Subjective: No major issues overnight-thinks that her left leg swelling/weakness is much better.   Assessment/Plan: AKI: Multifactorial etiology-due to rhabdomyolysis, contrast-induced nephropathy, bilateral renal infarcts-underwent CRRT from 3/17-3/23-now on intermittent HD.  Nephrology following and directing care.  Continue to monitor for signs of renal recovery.  Acute toxic encephalopathy: Due to drug overdose-mental status is much better.  Shock liver: Improving  Acute hypoxic respiratory failure: Intubated on admission-currently stable on room air.  Small bowel obstruction: Treated with conservative measures-has resolved.   Chronic cholecystitis: No evidence for acute cystitis-see imaging studies above.  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  Did receive PRBC transfusion during the earlier part of this hospital stay.  Hyponatremia: Mild-probably will improve post HD-recheck electrolytes tomorrow.  Drug overdose  Bereavement/grief/schizophrenia/depression: Evaluated by psychiatry-continue Zyprexa-remains on trazodone/Zoloft-safety sitter at bedside as patient was hallucinating a few days back-currently she has stabilized-will d/c safety sitter  Morbid Obesity: Estimated body mass index is 42.67 kg/m as calculated from the following:   Height as of this encounter: '5\' 5"'$  (1.651 m).   Weight as of this encounter: 116.3 kg.     Diet: Diet Order            Diet renal with fluid restriction Fluid restriction: 1200  mL Fluid; Room service appropriate? Yes; Fluid consistency: Thin  Diet effective now  Code Status: Full code  Family Communication: None at bedside   Disposition Plan: Status is: Inpatient  Remains inpatient appropriate because:Inpatient level of care appropriate due to severity of illness   Dispo: The patient is from: Home              Anticipated d/c is to: SNF              Patient currently is not medically stable to d/c.   Difficult to place patient No   Barriers to Discharge: Renal failure requiring intermittent HD-awaiting renal recovery.  Antimicrobial agents: Anti-infectives (From admission, onward)   Start     Dose/Rate Route Frequency Ordered Stop   02/22/21 1015  ceFAZolin (ANCEF) powder 2 g  Status:  Discontinued        2 g Other To Surgery 02/22/21 0922 02/22/21 0945   02/22/21 1000  ceFAZolin (ANCEF) IVPB 2g/100 mL premix        2 g 200 mL/hr over 30 Minutes Intravenous To Radiology 02/22/21 0945 02/22/21 1250   02/16/21 1000  piperacillin-tazobactam (ZOSYN) IVPB 2.25 g  Status:  Discontinued        2.25 g 100 mL/hr over 30 Minutes Intravenous Every 8 hours 02/16/21 0720 02/17/21 1047   02/13/21 1400  piperacillin-tazobactam (ZOSYN) IVPB 3.375 g  Status:  Discontinued        3.375 g 12.5 mL/hr over 240 Minutes Intravenous Every 8 hours 02/13/21 0826 02/16/21 0720   02/13/21 0815  piperacillin-tazobactam (ZOSYN) IVPB 3.375 g        3.375 g 100 mL/hr over 30 Minutes Intravenous  Once 02/13/21 0733 02/13/21 0820   02/09/21 1015  ceFEPIme (MAXIPIME) 2 g in sodium chloride 0.9 % 100 mL IVPB        2 g 200 mL/hr over 30 Minutes Intravenous  Once 02/09/21 1010 02/09/21 1112   02/09/21 1015  vancomycin (VANCOCIN) IVPB 1000 mg/200 mL premix        1,000 mg 200 mL/hr over 60 Minutes Intravenous  Once 02/09/21 1010 02/09/21 1901       Time spent: 15- minutes-Greater than 50% of this time was spent in counseling, explanation of diagnosis,  planning of further management, and coordination of care.  MEDICATIONS: Scheduled Meds: . sodium chloride   Intravenous Once  . acetaminophen  650 mg Oral Once  . Chlorhexidine Gluconate Cloth  6 each Topical Daily  . diphenhydrAMINE  25 mg Intravenous Once  . famotidine  20 mg Oral Daily  . feeding supplement  237 mL Oral TID BM  . gabapentin  100 mg Oral BID  . heparin  5,000 Units Subcutaneous Q8H  . mouth rinse  15 mL Mouth Rinse BID  . multivitamin  1 tablet Oral QHS  . OLANZapine  10 mg Oral QHS  . sertraline  50 mg Oral Daily  . sodium chloride flush  10-40 mL Intracatheter Q12H  . torsemide  100 mg Oral Daily  . traZODone  150 mg Oral QHS   Continuous Infusions: PRN Meds:.ALPRAZolam, opium-belladonna, docusate, haloperidol lactate, HYDROmorphone (DILAUDID) injection, methocarbamol, ondansetron (ZOFRAN) IV, oxyCODONE, phenol, sodium chloride flush   PHYSICAL EXAM: Vital signs: Vitals:   02/23/21 1155 02/24/21 0402 02/24/21 0839 02/24/21 1216  BP: 131/76 (!) 121/58 130/70 122/67  Pulse: 91 100  89  Resp: '18 20  19  '$ Temp: 99 F (37.2 C)  98.1 F (36.7 C) 98.6 F (37 C)  TempSrc: Oral  Axillary Oral  SpO2: 93% 92%  94%  Weight: 116.3 kg     Height:       Filed Weights   02/23/21 0324 02/23/21 0745 02/23/21 1155  Weight: 119.4 kg 120 kg 116.3 kg   Body mass index is 42.67 kg/m.   Gen Exam:Alert awake-not in any distress HEENT:atraumatic, normocephalic Chest: B/L clear to auscultation anteriorly CVS:S1S2 regular Abdomen:soft non tender, non distended Extremities:no edema Neurology: Left leg-easily moves side-to-side-this morning just about able to lift it off the bed against gravity. Skin: no rash  I have personally reviewed following labs and imaging studies  LABORATORY DATA: CBC: Recent Labs  Lab 02/18/21 0102 02/19/21 0240 02/20/21 0921 02/21/21 0839 02/22/21 0042 02/23/21 0521 02/24/21 0116  WBC 15.1* 14.1* 13.5* 12.0* 10.7* 11.6* 12.5*   NEUTROABS 7.6 6.8 6.4 6.8 4.4  --   --   HGB 8.5* 7.2* 7.2* 6.3* 6.9* 7.5* 8.0*  HCT 24.3* 21.3* 21.2* 19.6* 21.4* 22.7* 23.8*  MCV 86.5 87.7 88.3 90.3 90.7 89.7 89.8  PLT 231 229 284 274 262 265 0000000    Basic Metabolic Panel: Recent Labs  Lab 02/18/21 0102 02/19/21 0240 02/20/21 0921 02/21/21 0839 02/22/21 0042 02/23/21 0521 02/24/21 0116  NA 129*   < > 130* 132* 130* 129* 132*  K 4.1   < > 4.8 4.2 4.1 4.2 3.6  CL 100   < > 99 99 97* 97* 95*  CO2 19*   < > 21* '24 23 22 28  '$ GLUCOSE 122*   < > 96 97 103* 104* 118*  BUN 60*   < > 70* 58* 52* 62* 32*  CREATININE 6.83*   < > 8.24* 6.30* 6.22* 7.07* 4.35*  CALCIUM 7.4*   < > 7.6* 7.4* 7.5* 7.7* 7.7*  MG 2.2  --   --   --   --   --   --   PHOS 4.5  --   --   --   --  6.3* 4.1   < > = values in this interval not displayed.    GFR: Estimated Creatinine Clearance: 19.7 mL/min (A) (by C-G formula based on SCr of 4.35 mg/dL (H)).  Liver Function Tests: Recent Labs  Lab 02/18/21 0102 02/23/21 0521 02/24/21 0116  AST 103*  --   --   ALT 86*  --   --   ALKPHOS 96  --   --   BILITOT 0.8  --   --   PROT 5.2*  --   --   ALBUMIN 1.8* 1.8* 2.1*   No results for input(s): LIPASE, AMYLASE in the last 168 hours. No results for input(s): AMMONIA in the last 168 hours.  Coagulation Profile: No results for input(s): INR, PROTIME in the last 168 hours.  Cardiac Enzymes: No results for input(s): CKTOTAL, CKMB, CKMBINDEX, TROPONINI in the last 168 hours.  BNP (last 3 results) No results for input(s): PROBNP in the last 8760 hours.  Lipid Profile: No results for input(s): CHOL, HDL, LDLCALC, TRIG, CHOLHDL, LDLDIRECT in the last 72 hours.  Thyroid Function Tests: No results for input(s): TSH, T4TOTAL, FREET4, T3FREE, THYROIDAB in the last 72 hours.  Anemia Panel: Recent Labs    02/22/21 1519 02/24/21 0116  FERRITIN 157  --   TIBC 279  --   IRON 24*  --   RETICCTPCT  --  2.5    Urine analysis:    Component Value  Date/Time   COLORURINE AMBER (A) 02/15/2021 1708   APPEARANCEUR TURBID (A) 02/15/2021 1708   LABSPEC 1.024 02/15/2021  1708   PHURINE 5.0 02/15/2021 1708   GLUCOSEU 50 (A) 02/15/2021 1708   HGBUR LARGE (A) 02/15/2021 1708   BILIRUBINUR NEGATIVE 02/15/2021 1708   KETONESUR 5 (A) 02/15/2021 1708   PROTEINUR 100 (A) 02/15/2021 1708   NITRITE NEGATIVE 02/15/2021 1708   LEUKOCYTESUR MODERATE (A) 02/15/2021 1708    Sepsis Labs: Lactic Acid, Venous    Component Value Date/Time   LATICACIDVEN 4.0 (HH) 02/09/2021 1108    MICROBIOLOGY: Recent Results (from the past 240 hour(s))  MRSA PCR Screening     Status: Abnormal   Collection Time: 02/16/21  4:50 AM   Specimen: Nasopharyngeal  Result Value Ref Range Status   MRSA by PCR POSITIVE (A) NEGATIVE Final    Comment:        The GeneXpert MRSA Assay (FDA approved for NASAL specimens only), is one component of a comprehensive MRSA colonization surveillance program. It is not intended to diagnose MRSA infection nor to guide or monitor treatment for MRSA infections. RESULT CALLED TO, READ BACK BY AND VERIFIED WITH: RN K.FRPOUST AT 0830 ON 02/16/2021 BY T.SAAD Performed at [facility], [place] [address] [place], [place] [address]     RADIOLOGY STUDIES/RESULTS: No results found.   LOS: 15 days   Oren Binet, MD  [facility]    To contact the attending provider between 7A-7P or the covering provider during after hours 7P-7A, please log into the web site [email or web address] and access using universal [place] password for that web site. If you do not have the password, please call the hospital operator.  02/24/2021, 2:30 PM

## 2021-02-24 NOTE — Progress Notes (Signed)
Per Dr. Sloan Leiter, the patient's suicide sitter order could be discontinued, but per the primary night nurse, Webb Silversmith, the patient continues to have suicidal ideation without a current plan.  Night time coverage has been paged to reorder at this time until day shift reassessment.  Will continue to monitor.

## 2021-02-24 NOTE — Progress Notes (Signed)
Physical Therapy Treatment Patient Details Name: Renee Wilkinson MRN: [id number] DOB: [birth year] Today's Date: 02/24/2021    History of Present Illness Pt is a 50 yo female, admitted to [facility] on 3/17 after being found down (next to deceased spouse) after smoking crack cocaine and now with AKI, hyperkalemia, rhabdomyolysis, and leukocytosis.  Pt with hx of substance abuse.    PT Comments    Pt supine in bed on entry, eager to tell therapist that she has been a lot more mobile in the last 2 days. Pt continues to be limited in safe mobility by L LE pain in presence of decreased strength and endurance. Pt is currently min guard for bed mobility, and min A for transfers and short distance ambulation. While up in standing pt able to participate in LE exercises. D/c plans remain appropriate at this time. PT will continue to follow acutely.   Follow Up Recommendations  CIR     Equipment Recommendations  None recommended by PT       Precautions / Restrictions Precautions Precautions: Fall Restrictions Weight Bearing Restrictions: No    Mobility  Bed Mobility Overal bed mobility: Needs Assistance Bed Mobility: Supine to Sit     Supine to sit: Min guard     General bed mobility comments: increased time and effort for moving L LE    Transfers Overall transfer level: Needs assistance Equipment used: Rolling walker (2 wheeled) Transfers: Sit to/from [facility] Sit to Stand: Min guard Stand pivot transfers: Min assist       General transfer comment: min guard for sit to stand, increased time and effort required, min A for steadying with transfer to and from [facility] with RW  Ambulation/Gait Ambulation/Gait assistance: Min assist Gait Distance (Feet): 10 Feet Assistive device: Rolling walker (2 wheeled) Gait Pattern/deviations: Step-to pattern;Decreased step length - right;Decreased step length - left;Decreased stance time - left;Antalgic Gait velocity: slowed Gait  velocity interpretation: <1.31 ft/sec, indicative of household ambulator General Gait Details: min A with close chair follow for ambulation to sink       Balance Overall balance assessment: Needs assistance Sitting-balance support: Feet supported;No upper extremity supported;Bilateral upper extremity supported;Single extremity supported Sitting balance-Leahy Scale: Fair     Standing balance support: Bilateral upper extremity supported;During functional activity;Single extremity supported Standing balance-Leahy Scale: Poor Standing balance comment: requires at least UE                            Cognition Arousal/Alertness: Awake/alert Behavior During Therapy: WFL for tasks assessed/performed;Anxious Overall Cognitive Status: No family/caregiver present to determine baseline cognitive functioning Area of Impairment: Awareness;Problem solving                   Current Attention Level: Selective;Alternating       Awareness: Emergent Problem Solving: Difficulty sequencing;Requires tactile cues;Requires verbal cues General Comments: pt requires increased cuing to stay on task      Exercises Total Joint Exercises Hip ABduction/ADduction: Left;10 reps;Standing Knee Flexion: Left;10 reps;Standing    General Comments General comments (skin integrity, edema, etc.): VSS on RA      Pertinent Vitals/Pain Pain Assessment: Faces Faces Pain Scale: Hurts little more Pain Location: L LE Pain Descriptors / Indicators: Guarding;Sore;Spasm;Numbness Pain Intervention(s): Monitored during session;Limited activity within patient's tolerance;Repositioned           PT Goals (current goals can now be found in the care plan section) Acute Rehab PT Goals Patient Stated Goal:  pain control, feel better PT Goal Formulation: With patient Time For Goal Achievement: 02/26/21 Potential to Achieve Goals: Good Progress towards PT goals: Progressing toward goals     Frequency    Min 3X/week      PT Plan Current plan remains appropriate       AM-PAC PT "6 Clicks" Mobility   Outcome Measure  Help needed turning from your back to your side while in a flat bed without using bedrails?: A Lot Help needed moving from lying on your back to sitting on the side of a flat bed without using bedrails?: A Lot Help needed moving to and from a bed to a chair (including a wheelchair)?: A Lot Help needed standing up from a chair using your arms (e.g., wheelchair or bedside chair)?: A Lot Help needed to walk in hospital room?: A Lot Help needed climbing 3-5 steps with a railing? : Total 6 Click Score: 11    End of Session Equipment Utilized During Treatment: Gait belt Activity Tolerance: Patient limited by pain Patient left: with call bell/phone within reach;in chair;with chair alarm set;with nursing/sitter in room Nurse Communication: Mobility status PT Visit Diagnosis: Muscle weakness (generalized) (M62.81);Pain;History of falling (Z91.81);Difficulty in walking, not elsewhere classified (R26.2) Pain - Right/Left: Left Pain - part of body: Leg     Time: [id number] PT Time Calculation (min) (ACUTE ONLY): 15 min  Charges:  $Therapeutic Exercise: 8-22 mins                     Browning Southwood B. Migdalia Dk PT, DPT Acute Rehabilitation Services Pager [phone] Office [phone]    [place] 02/24/2021, 2:54 PM

## 2021-02-24 NOTE — Progress Notes (Signed)
TRH night shift.  The staff reports that the patient is having suicidal ideations and would like safety protocols with one-to-one sitter to be restarted.  Tennis Must, MD.

## 2021-02-25 DIAGNOSIS — F251 Schizoaffective disorder, depressive type: Secondary | ICD-10-CM | POA: Diagnosis not present

## 2021-02-25 DIAGNOSIS — N179 Acute kidney failure, unspecified: Secondary | ICD-10-CM | POA: Diagnosis not present

## 2021-02-25 DIAGNOSIS — F1920 Other psychoactive substance dependence, uncomplicated: Secondary | ICD-10-CM | POA: Diagnosis present

## 2021-02-25 LAB — RENAL FUNCTION PANEL
Albumin: 2.2 g/dL — ABNORMAL LOW (ref 3.5–5.0)
Anion gap: 14 (ref 5–15)
BUN: 46 mg/dL — ABNORMAL HIGH (ref 6–20)
CO2: 24 mmol/L (ref 22–32)
Calcium: 8.2 mg/dL — ABNORMAL LOW (ref 8.9–10.3)
Chloride: 93 mmol/L — ABNORMAL LOW (ref 98–111)
Creatinine, Ser: 5.37 mg/dL — ABNORMAL HIGH (ref 0.44–1.00)
GFR, Estimated: 9 mL/min — ABNORMAL LOW (ref >60)
Glucose, Bld: 97 mg/dL (ref 70–99)
Phosphorus: 5.6 mg/dL — ABNORMAL HIGH (ref 2.5–4.6)
Potassium: 3.2 mmol/L — ABNORMAL LOW (ref 3.5–5.1)
Sodium: 131 mmol/L — ABNORMAL LOW (ref 135–145)

## 2021-02-25 LAB — CBC
HCT: 25.4 % — ABNORMAL LOW (ref 36.0–46.0)
Hemoglobin: 8.3 g/dL — ABNORMAL LOW (ref 12.0–15.0)
MCH: 29 pg (ref 26.0–34.0)
MCHC: 32.7 g/dL (ref 30.0–36.0)
MCV: 88.8 fL (ref 80.0–100.0)
Platelets: 277 10*3/uL (ref 150–400)
RBC: 2.86 MIL/uL — ABNORMAL LOW (ref 3.87–5.11)
RDW: 14.2 % (ref 11.5–15.5)
WBC: 10.5 10*3/uL (ref 4.0–10.5)
nRBC: 0 % (ref 0.0–0.2)

## 2021-02-25 MED ORDER — POTASSIUM CHLORIDE CRYS ER 20 MEQ PO TBCR
40.0000 meq | EXTENDED_RELEASE_TABLET | Freq: Once | ORAL | Status: AC
  Administered 2021-02-25: 40 meq via ORAL
  Filled 2021-02-25: qty 2

## 2021-02-25 MED ORDER — SERTRALINE HCL 100 MG PO TABS
100.0000 mg | ORAL_TABLET | Freq: Every day | ORAL | Status: DC
  Administered 2021-02-26 – 2021-03-07 (×10): 100 mg via ORAL
  Filled 2021-02-25 (×10): qty 1

## 2021-02-25 NOTE — Progress Notes (Signed)
Oxycodone '10mg'$  did not properly scan into system this AM at 0840. Have charted it and verified with charge RN.

## 2021-02-25 NOTE — Consult Note (Signed)
[facility] Psychiatry Consult   Reason for Consult:  ''suicidal ideation.'' Referring Physician:  Oren Binet, MD Patient Identification: Renee Wilkinson MRN:  [id number] Principal Diagnosis: Schizoaffective disorder, depressive type ([place]) Diagnosis:  Principal Problem:   Schizoaffective disorder, depressive type ([place]) Active Problems:   Renal failure   Encounter for central line placement   Polysubstance dependence ([place])   Total Time spent with patient: 1 hour  Subjective:   ''I am depressed, feeling suicidal but I have no plan to take my life, I just need help.''  HPI: Patient is a 50 year old female who reports history of Schizophrenia, Major depression,Anxiety  and polysubstance use (opiates, cocaine, marijuana and tobacco) who was admitted to the hospital due to acute encephalopathy secondary to drug overdose. Patient reports that she has a lot going on in her life, reports that her husband died recently and she has been having difficulty processing her death. Patient reports that she moved from [place] to [place], [place] in October, 2021 but had to move to [place], [place] in February, 2022 because things did not work out for her fanily in [place]. She states that she has been getting increasingly more depressed characterized by low energy level, psychomotor retardation, hopelessness and recurrent suicidal thoughts. She also reports seeing things crawling in her room and hearing  voices- telling her to "slit her wrist" but has no plan to do so. She reports that she is also overwhelmed dealing with multiple medication problem. She is requesting to be put back on Zyprexa, Zoloft that helped her in the past.  Past Psychiatric History: as above  Risk to Self:  recurrent suicidal thought Risk to Others:  denies Prior Inpatient Therapy:  multiple in the past Prior Outpatient Therapy:  yes  Past Medical History: as above  Family History: No family history on  file. Family Psychiatric  History:  Social History:  Social History   Substance and Sexual Activity  Alcohol Use None     Social History   Substance and Sexual Activity  Drug Use Not on file    Social History   Socioeconomic History  . Marital status: Widowed    Spouse name: Not on file  . Number of children: Not on file  . Years of education: Not on file  . Highest education level: Not on file  Occupational History  . Not on file  Tobacco Use  . Smoking status: Not on file  . Smokeless tobacco: Not on file  Substance and Sexual Activity  . Alcohol use: Not on file  . Drug use: Not on file  . Sexual activity: Not on file  Other Topics Concern  . Not on file  Social History Narrative  . Not on file   Social Determinants of Health   Financial Resource Strain: Not on file  Food Insecurity: Not on file  Transportation Needs: Not on file  Physical Activity: Not on file  Stress: Not on file  Social Connections: Not on file   Additional Social History:    Allergies:  No Known Allergies  Labs:  Results for orders placed or performed during the hospital encounter of 02/09/21 (from the past 48 hour(s))  Reticulocytes     Status: Abnormal   Collection Time: 02/24/21  1:16 AM  Result Value Ref Range   Retic Ct Pct 2.5 0.4 - 3.1 %   RBC. 2.70 (L) 3.87 - 5.11 MIL/uL   Retic Count, Absolute 67.5 19.0 - 186.0 K/uL   Immature Retic Fract 27.5 (H)  2.3 - 15.9 %    Comment: Performed at [facility], [place] [address] [place], [place] [address]  CBC     Status: Abnormal   Collection Time: 02/24/21  1:16 AM  Result Value Ref Range   WBC 12.5 (H) 4.0 - 10.5 K/uL   RBC 2.65 (L) 3.87 - 5.11 MIL/uL   Hemoglobin 8.0 (L) 12.0 - 15.0 g/dL   HCT 23.8 (L) 36.0 - 46.0 %   MCV 89.8 80.0 - 100.0 fL   MCH 30.2 26.0 - 34.0 pg   MCHC 33.6 30.0 - 36.0 g/dL   RDW 14.4 11.5 - 15.5 %   Platelets 293 150 - 400 K/uL   nRBC 0.0 0.0 - 0.2 %    Comment: Performed at [facility], [place] [address], [place], [place] [address]  Renal function panel     Status: Abnormal   Collection Time: 02/24/21  1:16 AM  Result Value Ref Range   Sodium 132 (L) 135 - 145 mmol/L   Potassium 3.6 3.5 - 5.1 mmol/L   Chloride 95 (L) 98 - 111 mmol/L   CO2 28 22 - 32 mmol/L   Glucose, Bld 118 (H) 70 - 99 mg/dL    Comment: Glucose reference range applies only to samples taken after fasting for at least 8 hours.   BUN 32 (H) 6 - 20 mg/dL   Creatinine, Ser 4.35 (H) 0.44 - 1.00 mg/dL    Comment: DELTA CHECK NOTED   Calcium 7.7 (L) 8.9 - 10.3 mg/dL   Phosphorus 4.1 2.5 - 4.6 mg/dL   Albumin 2.1 (L) 3.5 - 5.0 g/dL   GFR, Estimated 12 (L) >60 mL/min    Comment: (NOTE) Calculated using the CKD-EPI Creatinine Equation (2021)    Anion gap 9 5 - 15    Comment: Performed at [facility] [address] [place], [place] [address]  Renal function panel     Status: Abnormal   Collection Time: 02/25/21  6:34 AM  Result Value Ref Range   Sodium 131 (L) 135 - 145 mmol/L   Potassium 3.2 (L) 3.5 - 5.1 mmol/L   Chloride 93 (L) 98 - 111 mmol/L   CO2 24 22 - 32 mmol/L   Glucose, Bld 97 70 - 99 mg/dL    Comment: Glucose reference range applies only to samples taken after fasting for at least 8 hours.   BUN 46 (H) 6 - 20 mg/dL   Creatinine, Ser 5.37 (H) 0.44 - 1.00 mg/dL   Calcium 8.2 (L) 8.9 - 10.3 mg/dL   Phosphorus 5.6 (H) 2.5 - 4.6 mg/dL   Albumin 2.2 (L) 3.5 - 5.0 g/dL   GFR, Estimated 9 (L) >60 mL/min    Comment: (NOTE) Calculated using the CKD-EPI Creatinine Equation (2021)    Anion gap 14 5 - 15    Comment: Performed at [facility] [address], [place], [place] [address]  CBC     Status: Abnormal   Collection Time: 02/25/21  6:34 AM  Result Value Ref Range   WBC 10.5 4.0 - 10.5 K/uL   RBC 2.86 (L) 3.87 - 5.11 MIL/uL   Hemoglobin 8.3 (L) 12.0 - 15.0 g/dL   HCT 25.4 (L) 36.0 - 46.0 %   MCV 88.8 80.0 - 100.0 fL   MCH 29.0 26.0 - 34.0 pg   MCHC 32.7 30.0 - 36.0 g/dL    RDW 14.2 11.5 - 15.5 %   Platelets 277 150 -  400 K/uL   nRBC 0.0 0.0 - 0.2 %    Comment: Performed at [facility], [place] [address] [place], [place] [address]    Current Facility-Administered Medications  Medication Dose Route Frequency Provider Last Rate Last Admin  . 0.9 %  sodium chloride infusion (Manually program via Guardrails IV Fluids)   Intravenous Once Frederik Pear, MD      . acetaminophen (TYLENOL) tablet 650 mg  650 mg Oral Once Jonetta Osgood, MD      . ALPRAZolam Duanne Moron) tablet 0.5 mg  0.5 mg Oral TID PRN Blenda Nicely, RPH   0.5 mg at 02/25/21 1215  . belladonna-opium (B&O) suppository 16.2-'60mg'$   1 suppository Rectal Q8H PRN Anders Simmonds, MD      . Chlorhexidine Gluconate Cloth 2 % PADS 6 each  6 each Topical Daily Juanito Doom, MD   6 each at 02/25/21 [phone]  . diphenhydrAMINE (BENADRYL) injection 25 mg  25 mg Intravenous Once Ghimire, Henreitta Leber, MD      . docusate (COLACE) 50 MG/5ML liquid 100 mg  100 mg Oral BID PRN Blenda Nicely, RPH      . famotidine (PEPCID) tablet 20 mg  20 mg Oral Daily Blenda Nicely, RPH   20 mg at 02/25/21 [id number]  . feeding supplement (ENSURE ENLIVE / ENSURE PLUS) liquid 237 mL  237 mL Oral TID BM Starla Link, Kshitiz, MD   237 mL at 02/25/21 0835  . gabapentin (NEURONTIN) capsule 100 mg  100 mg Oral BID Aline August, MD   100 mg at 02/25/21 0835  . haloperidol lactate (HALDOL) injection 1-2 mg  1-2 mg Intravenous Q6H PRN Aline August, MD      . heparin injection 5,000 Units  5,000 Units Subcutaneous Q8H Allred, Darrell K, PA-C   5,000 Units at 02/25/21 0528  . HYDROmorphone (DILAUDID) injection 0.5 mg  0.5 mg Intravenous Q4H PRN Aline August, MD   0.5 mg at 02/25/21 0527  . MEDLINE mouth rinse  15 mL Mouth Rinse BID Hunsucker, Bonna Gains, MD   15 mL at 02/25/21 0835  . methocarbamol (ROBAXIN) tablet 750 mg  750 mg Oral Q6H PRN Aline August, MD   750 mg at 02/24/21 2037  . multivitamin (RENA-VIT) tablet 1 tablet  1 tablet  Oral QHS Jonetta Osgood, MD   1 tablet at 02/24/21 2208  . OLANZapine (ZYPREXA) tablet 10 mg  10 mg Oral QHS Suella Broad, FNP   10 mg at 02/24/21 2227  . ondansetron (ZOFRAN) injection 4 mg  4 mg Intravenous Q6H PRN Hunsucker, Bonna Gains, MD   4 mg at 02/12/21 1452  . oxyCODONE (Oxy IR/ROXICODONE) immediate release tablet 5-10 mg  5-10 mg Oral Q4H PRN Aline August, MD   10 mg at 02/25/21 1215  . phenol (CHLORASEPTIC) mouth spray 1 spray  1 spray Mouth/Throat PRN Spero Geralds, MD   1 spray at 02/15/21 [phone]  . [START ON 02/26/2021] sertraline (ZOLOFT) tablet 100 mg  100 mg Oral Daily Kyllian Clingerman, MD      . sodium chloride flush (NS) 0.9 % injection 10-40 mL  10-40 mL Intracatheter Q12H Hunsucker, Bonna Gains, MD   10 mL at 02/25/21 0835  . sodium chloride flush (NS) 0.9 % injection 10-40 mL  10-40 mL Intracatheter PRN Hunsucker, Bonna Gains, MD   10 mL at 02/18/21 2152  . torsemide (DEMADEX) tablet 100 mg  100 mg Oral Daily Justin Mend, MD  100 mg at 02/25/21 0835  . traZODone (DESYREL) tablet 150 mg  150 mg Oral QHS Simonne Maffucci B, MD   150 mg at 02/24/21 2208    Musculoskeletal: Strength & Muscle Tone: not tested Gait & Station: unsteady Patient leans: N/A    Psychiatric Specialty Exam:  Presentation  General Appearance: Appropriate for Environment; Casual  Eye Contact:Fair  Speech:Clear and Coherent; Normal Rate  Speech Volume:Normal  Handedness:Right   Mood and Affect  Mood:Anxious; Depressed  Affect:Tearful; Depressed; Labile   Thought Process  Thought Processes:Coherent; Linear  Descriptions of Associations:Intact  Orientation:Full (Time, Place and Person)  Thought Content:Tangential  History of Schizophrenia/Schizoaffective disorder:No data recorded Duration of Psychotic Symptoms:No data recorded Hallucinations:No data recorded Ideas of Reference:None  Suicidal Thoughts:No data recorded Homicidal Thoughts:No data  recorded  Sensorium  Memory:Immediate Poor; Immediate Fair; Recent Poor; Remote Fair  Judgment:Fair  Insight:Shallow   Executive Functions  Concentration:Good  Attention Span:Good  [place]  Language:Good   Psychomotor Activity  Psychomotor Activity:No data recorded  Assets  Assets:Desire for Improvement; Armed forces logistics/support/administrative officer; Financial Resources/Insurance; Housing; Social Support; Talents/Skills   Sleep  Sleep:No data recorded  Physical Exam: Physical Exam Psychiatric:        Attention and Perception: Attention normal. She perceives auditory and visual hallucinations.        Mood and Affect: Mood is depressed.        Speech: Speech normal.        Behavior: Behavior is cooperative.        Thought Content: Thought content includes suicidal ideation.        Cognition and Memory: Cognition and memory normal.        Judgment: Judgment normal.    Review of Systems  Constitutional: Positive for malaise/fatigue.  HENT: Negative.   Eyes: Negative.   Respiratory: Negative.   Cardiovascular: Positive for leg swelling.  Gastrointestinal: Negative.   Skin: Negative.   Psychiatric/Behavioral: Positive for depression, hallucinations and suicidal ideas.   Blood pressure 125/77, pulse 97, temperature 99.6 F (37.6 C), temperature source Oral, resp. rate 17, height '5\' 5"'$  (1.651 m), weight 116.3 kg, SpO2 95 %. Body mass index is 42.67 kg/m.  Treatment Plan Summary: 50 year old female who reports history of Schizophrenia, Major depression,Anxiety  and polysubstance use (opiates, cocaine, marijuana and tobacco). Currently, patient is reporting worsening depression, psychosis, suicidal thoughts with no plan. Patient will benefit from psychiatric inpatient admission after she is medically stabilized.  Recommendations: -Continue 1:1 Sitter for safety -Continue Zyprexa 10 mg at bedtime for psychosis -Increase Zoloft from 50 mg to 100 mg daily for  anxiety/ Depression -Avoid Xanax in patient with history of polysubstance abuse, may consider Hydroxyzine as needed for anxiety -Consider social worker consult to facilitate inpatient psychiatric placement after patient is medically stabilized.   Disposition: Recommend psychiatric Inpatient admission when medically cleared. Supportive therapy provided about ongoing stressors. Psychiatric service is signing out. Re-consult as needed  Corena Pilgrim, MD 02/25/2021 12:24 PM
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
## 2021-02-25 NOTE — Progress Notes (Signed)
PROGRESS NOTE        PATIENT DETAILS Name: Renee Wilkinson Age: 50 y.o. Sex: female Date of Birth: 07-26-1971 Admit Date: 02/09/2021 Admitting Physician Lanier Clam, MD BP:[id number], No Pcp Per (Inactive)  Brief Narrative: Patient is a 50 y.o. female with history of schizophrenia, depression/anxiety substance abuse-who was found down beside her husband (found dead by EMS)-upon presentation to the ED she was found to have acute toxic encephalopathy due to drug overdose-along with AKI, hyperkalemia, shock liver, CT imaging positive for renal infarcts-patient was intubated and admitted to the ICU.  She was started on CRRT.  Upon further stability-she was transferred to the [facility] service on 3/25.  Significant events: 3/17>> admit to ICU-intubated-started CRRT 3/25>> transferred to [facility]  Significant studies: 3/17>> UDS: Negative 3/17>> CT head: No acute abnormalities. 3/17>> CT angio chest/abdomen: No aorticdissection,b/l renal infarcts 3/18>> RUQ ultrasound: Cholelithiasis 3/19>> Echo: EF 60-65% 3/21>> RUQ ultrasound: Cholelithiasis and sludge-no evidence of acute cholecystitis. 3/21>> MRI C-spine: Myositis-right posterior paraspinous muscles without fluid collection. 3/21>> MRI T-spine: Myositis involving posterior paraspinous muscle 3/21>> MRI L-spine: Right posterior muscle edema throughout lumbar spine 3/23>> renal ultrasound: No hydronephrosis 3/20>> CT C-spine: No bone/disc space infection 3/20>> CT LS spine: No spinal infection 3/20>> CT abdomen/pelvis: High-grade SBO transition point in the ventral abdominal wall hernia. 3/22>> CT abdomen/pelvis: Persistent dilated small bowel loops-consistent with partial SBO. 3/22>> HIDA scan: Delayed gallbladder filling-patency of cystic duct. 3/24>> left lower extremity Doppler: No DVT  Antimicrobial therapy: See below  Microbiology data: 3/17>> blood culture: No growth x5 days  Procedures : 3/17>> R IJ  HD cath 3/17 ETT>> 3/17 3/30>> tunneled HD catheter placement  Consults: CCM, nephrology, neurology, orthopedics, psychiatry, general surgery  DVT Prophylaxis : heparin injection 5,000 Units Start: 02/14/21 2200   Subjective: Left leg better-but very tearful this morning.  Assessment/Plan: AKI: Multifactorial etiology-due to rhabdomyolysis, contrast-induced nephropathy, bilateral renal infarcts-underwent CRRT from 3/17-3/23-and then received intermittent HD at the discretion of nephrology.  Continue to monitor closely-nephrology holding dialysis over the weekend-watch closely for signs of renal recovery.  Nephrology has started patient on Demadex.  Acute toxic encephalopathy: Due to drug overdose-mental status is much better.  Shock liver: Improving  Acute hypoxic respiratory failure: Intubated on admission-currently stable on room air.  Small bowel obstruction: Treated with conservative measures-has resolved.   Chronic cholecystitis: No evidence for acute cystitis-see imaging studies above.  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  Neurology has signed off  Normocytic anemia: Probably due to acute illness-AKI-no evidence of blood loss-CBC stable.  Did receive PRBC transfusion during the earlier part of this hospital stay.  Hyponatremia: Mild-probably will improve post HD-recheck electrolytes tomorrow.  Drug overdose  Bereavement/grief/schizophrenia/depression: Evaluated by psychiatry-continue Zyprexa-remains on trazodone/Zoloft-safety sitter at bedside.  Per psychiatry note on 4/2-patient requires inpatient psychiatric admission when medically stable.  Morbid Obesity: Estimated body mass index is 42.67 kg/m as calculated from the following:   Height as of this encounter: '5\' 5"'$   (1.651 m).   Weight as of this encounter: 116.3 kg.     Diet: Diet Order            Diet renal with fluid restriction Fluid restriction: 1200 mL Fluid;  Room service appropriate? Yes; Fluid consistency: Thin  Diet effective now                  Code Status: Full code  Family Communication: None at bedside   Disposition Plan: Status is: Inpatient  Remains inpatient appropriate because:Inpatient level of care appropriate due to severity of illness   Dispo: The patient is from: Home              Anticipated d/c is to: SNF              Patient currently is not medically stable to d/c.   Difficult to place patient No   Barriers to Discharge: Renal failure requiring intermittent HD-awaiting renal recovery.  Antimicrobial agents: Anti-infectives (From admission, onward)   Start     Dose/Rate Route Frequency Ordered Stop   02/22/21 1015  ceFAZolin (ANCEF) powder 2 g  Status:  Discontinued        2 g Other To Surgery 02/22/21 0922 02/22/21 0945   02/22/21 1000  ceFAZolin (ANCEF) IVPB 2g/100 mL premix        2 g 200 mL/hr over 30 Minutes Intravenous To Radiology 02/22/21 0945 02/22/21 1250   02/16/21 1000  piperacillin-tazobactam (ZOSYN) IVPB 2.25 g  Status:  Discontinued        2.25 g 100 mL/hr over 30 Minutes Intravenous Every 8 hours 02/16/21 0720 02/17/21 1047   02/13/21 1400  piperacillin-tazobactam (ZOSYN) IVPB 3.375 g  Status:  Discontinued        3.375 g 12.5 mL/hr over 240 Minutes Intravenous Every 8 hours 02/13/21 0826 02/16/21 0720   02/13/21 0815  piperacillin-tazobactam (ZOSYN) IVPB 3.375 g        3.375 g 100 mL/hr over 30 Minutes Intravenous  Once 02/13/21 0733 02/13/21 0820   02/09/21 1015  ceFEPIme (MAXIPIME) 2 g in sodium chloride 0.9 % 100 mL IVPB        2 g 200 mL/hr over 30 Minutes Intravenous  Once 02/09/21 1010 02/09/21 1112   02/09/21 1015  vancomycin (VANCOCIN) IVPB 1000 mg/200 mL premix        1,000 mg 200 mL/hr over 60 Minutes Intravenous  Once  02/09/21 1010 02/09/21 1901       Time spent: 15- minutes-Greater than 50% of this time was spent in counseling, explanation of diagnosis, planning of further management, and coordination of care.  MEDICATIONS: Scheduled Meds: . sodium chloride   Intravenous Once  . acetaminophen  650 mg Oral Once  . Chlorhexidine Gluconate Cloth  6 each Topical Daily  . diphenhydrAMINE  25 mg Intravenous Once  . famotidine  20 mg Oral Daily  . feeding supplement  237 mL Oral TID BM  . gabapentin  100 mg Oral BID  . heparin  5,000 Units Subcutaneous Q8H  . mouth rinse  15 mL Mouth Rinse BID  . multivitamin  1 tablet Oral QHS  . OLANZapine  10 mg Oral QHS  . [START ON 02/26/2021] sertraline  100 mg Oral Daily  . sodium chloride flush  10-40 mL Intracatheter Q12H  . torsemide  100 mg Oral Daily  . traZODone  150 mg Oral QHS   Continuous Infusions: PRN Meds:.ALPRAZolam, opium-belladonna, docusate, haloperidol lactate, HYDROmorphone (DILAUDID) injection, methocarbamol, ondansetron (ZOFRAN) IV, oxyCODONE, phenol, sodium chloride flush   PHYSICAL EXAM: Vital signs: Vitals:   02/24/21 2259 02/25/21 0511 02/25/21 0824 02/25/21 1226  BP: 125/77 (!) 149/82 125/77 130/81  Pulse: 97 96 97 65  Resp: 18  $'20 17 12  'l$ Temp: 99.8 F (37.7 C) 99.7 F (37.6 C) 99.6 F (37.6 C) 99.5 F (37.5 C)  TempSrc: Oral Oral Oral Oral  SpO2: 92% 92% 95% 95%  Weight:      Height:       Filed Weights   02/23/21 0324 02/23/21 0745 02/23/21 1155  Weight: 119.4 kg 120 kg 116.3 kg   Body mass index is 42.67 kg/m.   Gen Exam:Alert awake-not in any distress HEENT:atraumatic, normocephalic Chest: B/L clear to auscultation anteriorly CVS:S1S2 regular Abdomen:soft non tender, non distended Extremities:no edema Neurology: Now able to lift left leg against mild resistance Skin: no rash  I have personally reviewed following labs and imaging studies  LABORATORY DATA: CBC: Recent Labs  Lab 02/19/21 0240  02/20/21 0921 02/21/21 0839 02/22/21 0042 02/23/21 0521 02/24/21 0116 02/25/21 0634  WBC 14.1* 13.5* 12.0* 10.7* 11.6* 12.5* 10.5  NEUTROABS 6.8 6.4 6.8 4.4  --   --   --   HGB 7.2* 7.2* 6.3* 6.9* 7.5* 8.0* 8.3*  HCT 21.3* 21.2* 19.6* 21.4* 22.7* 23.8* 25.4*  MCV 87.7 88.3 90.3 90.7 89.7 89.8 88.8  PLT 229 284 274 262 265 293 [id number]    Basic Metabolic Panel: Recent Labs  Lab 02/21/21 0839 02/22/21 0042 02/23/21 0521 02/24/21 0116 02/25/21 0634  NA 132* 130* 129* 132* 131*  K 4.2 4.1 4.2 3.6 3.2*  CL 99 97* 97* 95* 93*  CO2 '24 23 22 28 24  '$ GLUCOSE 97 103* 104* 118* 97  BUN 58* 52* 62* 32* 46*  CREATININE 6.30* 6.22* 7.07* 4.35* 5.37*  CALCIUM 7.4* 7.5* 7.7* 7.7* 8.2*  PHOS  --   --  6.3* 4.1 5.6*    GFR: Estimated Creatinine Clearance: 16 mL/min (A) (by C-G formula based on SCr of 5.37 mg/dL (H)).  Liver Function Tests: Recent Labs  Lab 02/23/21 0521 02/24/21 0116 02/25/21 0634  ALBUMIN 1.8* 2.1* 2.2*   No results for input(s): LIPASE, AMYLASE in the last 168 hours. No results for input(s): AMMONIA in the last 168 hours.  Coagulation Profile: No results for input(s): INR, PROTIME in the last 168 hours.  Cardiac Enzymes: No results for input(s): CKTOTAL, CKMB, CKMBINDEX, TROPONINI in the last 168 hours.  BNP (last 3 results) No results for input(s): PROBNP in the last 8760 hours.  Lipid Profile: No results for input(s): CHOL, HDL, LDLCALC, TRIG, CHOLHDL, LDLDIRECT in the last 72 hours.  Thyroid Function Tests: No results for input(s): TSH, T4TOTAL, FREET4, T3FREE, THYROIDAB in the last 72 hours.  Anemia Panel: Recent Labs    02/22/21 1519 02/24/21 0116  FERRITIN 157  --   TIBC 279  --   IRON 24*  --   RETICCTPCT  --  2.5    Urine analysis:    Component Value Date/Time   COLORURINE AMBER (A) 02/15/2021 1708   APPEARANCEUR TURBID (A) 02/15/2021 1708   LABSPEC 1.024 02/15/2021 1708   PHURINE 5.0 02/15/2021 1708   GLUCOSEU 50 (A) 02/15/2021 1708    HGBUR LARGE (A) 02/15/2021 1708   BILIRUBINUR NEGATIVE 02/15/2021 1708   KETONESUR 5 (A) 02/15/2021 1708   PROTEINUR 100 (A) 02/15/2021 1708   NITRITE NEGATIVE 02/15/2021 1708   LEUKOCYTESUR MODERATE (A) 02/15/2021 1708    Sepsis Labs: Lactic Acid, Venous    Component Value Date/Time   LATICACIDVEN 4.0 (HH) 02/09/2021 1108    MICROBIOLOGY: Recent Results (from the past 240 hour(s))  MRSA PCR Screening     Status: Abnormal   Collection  Time: 02/16/21  4:50 AM   Specimen: Nasopharyngeal  Result Value Ref Range Status   MRSA by PCR POSITIVE (A) NEGATIVE Final    Comment:        The GeneXpert MRSA Assay (FDA approved for NASAL specimens only), is one component of a comprehensive MRSA colonization surveillance program. It is not intended to diagnose MRSA infection nor to guide or monitor treatment for MRSA infections. RESULT CALLED TO, READ BACK BY AND VERIFIED WITH: RN [email or web address] AT 0830 ON 02/16/2021 BY T.SAAD Performed at [facility], [place] [address] [place], [place] [address]     RADIOLOGY STUDIES/RESULTS: No results found.   LOS: 16 days   Oren Binet, MD  [facility]    To contact the attending provider between 7A-7P or the covering provider during after hours 7P-7A, please log into the web site [email or web address] and access using universal [place] password for that web site. If you do not have the password, please call the hospital operator.  02/25/2021, 2:06 PM

## 2021-02-26 ENCOUNTER — Encounter (HOSPITAL_COMMUNITY): Payer: Self-pay | Admitting: Pulmonary Disease

## 2021-02-26 DIAGNOSIS — N179 Acute kidney failure, unspecified: Secondary | ICD-10-CM | POA: Diagnosis not present

## 2021-02-26 LAB — RENAL FUNCTION PANEL
Albumin: 2 g/dL — ABNORMAL LOW (ref 3.5–5.0)
Anion gap: 9 (ref 5–15)
BUN: 54 mg/dL — ABNORMAL HIGH (ref 6–20)
CO2: 27 mmol/L (ref 22–32)
Calcium: 7.9 mg/dL — ABNORMAL LOW (ref 8.9–10.3)
Chloride: 97 mmol/L — ABNORMAL LOW (ref 98–111)
Creatinine, Ser: 5.58 mg/dL — ABNORMAL HIGH (ref 0.44–1.00)
GFR, Estimated: 9 mL/min — ABNORMAL LOW (ref >60)
Glucose, Bld: 98 mg/dL (ref 70–99)
Phosphorus: 5 mg/dL — ABNORMAL HIGH (ref 2.5–4.6)
Potassium: 3.3 mmol/L — ABNORMAL LOW (ref 3.5–5.1)
Sodium: 133 mmol/L — ABNORMAL LOW (ref 135–145)

## 2021-02-26 MED ORDER — POTASSIUM CHLORIDE CRYS ER 20 MEQ PO TBCR
40.0000 meq | EXTENDED_RELEASE_TABLET | Freq: Once | ORAL | Status: AC
  Administered 2021-02-26: 40 meq via ORAL
  Filled 2021-02-26: qty 2

## 2021-02-26 NOTE — Progress Notes (Signed)
Patient in room with sitter and is crying, stating "I just can't get the voices to stop". When asked what the voices are telling her, patient stated, "It is like I am arguing with myself in my own head. I keep wondering what is next for me now that I don't have my husband". PRN Xanax and pain medicine given. Patient stated that she would like to try and rest to get her "head calmed down". Both myself and the sitter provided emotional support to the patient.

## 2021-02-26 NOTE — Progress Notes (Signed)
PROGRESS NOTE        PATIENT DETAILS Name: Renee Wilkinson Age: 50 y.o. Sex: female Date of Birth: February 25, 1971 Admit Date: 02/09/2021 Admitting Physician Lanier Clam, MD BP:[id number], No Pcp Per (Inactive)  Brief Narrative: Patient is a 50 y.o. female with history of schizophrenia, depression/anxiety substance abuse-who was found down beside her husband (found dead by EMS)-upon presentation to the ED she was found to have acute toxic encephalopathy due to drug overdose-along with AKI, hyperkalemia, shock liver, CT imaging positive for renal infarcts-patient was intubated and admitted to the ICU.  She was started on CRRT.  Upon further stability-she was transferred to the [facility] service on 3/25.  Significant events: 3/17>> admit to ICU-intubated-started CRRT 3/25>> transferred to [facility]  Significant studies: 3/17>> UDS: Negative 3/17>> CT head: No acute abnormalities. 3/17>> CT angio chest/abdomen: No aorticdissection,b/l renal infarcts 3/18>> RUQ ultrasound: Cholelithiasis 3/19>> Echo: EF 60-65% 3/21>> RUQ ultrasound: Cholelithiasis and sludge-no evidence of acute cholecystitis. 3/21>> MRI C-spine: Myositis-right posterior paraspinous muscles without fluid collection. 3/21>> MRI T-spine: Myositis involving posterior paraspinous muscle 3/21>> MRI L-spine: Right posterior muscle edema throughout lumbar spine 3/23>> renal ultrasound: No hydronephrosis 3/20>> CT C-spine: No bone/disc space infection 3/20>> CT LS spine: No spinal infection 3/20>> CT abdomen/pelvis: High-grade SBO transition point in the ventral abdominal wall hernia. 3/22>> CT abdomen/pelvis: Persistent dilated small bowel loops-consistent with partial SBO. 3/22>> HIDA scan: Delayed gallbladder filling-patency of cystic duct. 3/24>> left lower extremity Doppler: No DVT  Antimicrobial therapy: See below  Microbiology data: 3/17>> blood culture: No growth x5 days  Procedures : 3/17>> R IJ  HD cath 3/17 ETT>> 3/17 3/30>> tunneled HD catheter placement  Consults: CCM, nephrology, neurology, orthopedics, psychiatry, general surgery  DVT Prophylaxis : heparin injection 5,000 Units Start: 02/14/21 2200   Subjective: No major issues this morning-lying comfortably in bed.  Left leg slowly getting better-continues to have some swelling.  When asked about her mood-"I am working through it"  Assessment/Plan: AKI: Multifactorial etiology-due to rhabdomyolysis, contrast-induced nephropathy, bilateral renal infarcts-underwent CRRT from 3/17-3/23-and then received intermittent HD at the discretion of nephrology.  Continue to monitor closely-for signs of renal recovery.  Acute toxic encephalopathy: Due to drug overdose-mental status is much better.  Shock liver: Improving  Acute hypoxic respiratory failure: Intubated on admission-currently stable on room air.  Small bowel obstruction: Treated with conservative measures-has resolved.   Chronic cholecystitis: No evidence for acute cystitis-see imaging studies above.  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  Neurology has signed off  Normocytic anemia: Probably due to acute illness-AKI-no evidence of blood loss-CBC stable.  Did receive PRBC transfusion during the earlier part of this hospital stay.  Hyponatremia: Mild-probably will improve post HD-recheck electrolytes tomorrow.  Drug overdose  Bereavement/grief/schizophrenia/depression: Evaluated by psychiatry-continue Zyprexa-remains on trazodone/Zoloft-safety sitter at bedside.  Per psychiatry note on 4/2-patient requires inpatient psychiatric admission when medically stable.  Morbid Obesity: Estimated body mass index is 42.85 kg/m as calculated from the following:   Height as of  this encounter: '5\' 5"'$  (1.651 m).   Weight as of this encounter: 116.8 kg.     Diet: Diet Order            Diet renal with  fluid restriction Fluid restriction: 1200 mL Fluid; Room service appropriate? Yes; Fluid consistency: Thin  Diet effective now                  Code Status: Full code  Family Communication: None at bedside   Disposition Plan: Status is: Inpatient  Remains inpatient appropriate because:Inpatient level of care appropriate due to severity of illness   Dispo: The patient is from: Home              Anticipated d/c is to: SNF              Patient currently is not medically stable to d/c.   Difficult to place patient No   Barriers to Discharge: Renal failure requiring intermittent HD-awaiting renal recovery.  Antimicrobial agents: Anti-infectives (From admission, onward)   Start     Dose/Rate Route Frequency Ordered Stop   02/22/21 1015  ceFAZolin (ANCEF) powder 2 g  Status:  Discontinued        2 g Other To Surgery 02/22/21 0922 02/22/21 0945   02/22/21 1000  ceFAZolin (ANCEF) IVPB 2g/100 mL premix        2 g 200 mL/hr over 30 Minutes Intravenous To Radiology 02/22/21 0945 02/22/21 1250   02/16/21 1000  piperacillin-tazobactam (ZOSYN) IVPB 2.25 g  Status:  Discontinued        2.25 g 100 mL/hr over 30 Minutes Intravenous Every 8 hours 02/16/21 0720 02/17/21 1047   02/13/21 1400  piperacillin-tazobactam (ZOSYN) IVPB 3.375 g  Status:  Discontinued        3.375 g 12.5 mL/hr over 240 Minutes Intravenous Every 8 hours 02/13/21 0826 02/16/21 0720   02/13/21 0815  piperacillin-tazobactam (ZOSYN) IVPB 3.375 g        3.375 g 100 mL/hr over 30 Minutes Intravenous  Once 02/13/21 0733 02/13/21 0820   02/09/21 1015  ceFEPIme (MAXIPIME) 2 g in sodium chloride 0.9 % 100 mL IVPB        2 g 200 mL/hr over 30 Minutes Intravenous  Once 02/09/21 1010 02/09/21 1112   02/09/21 1015  vancomycin (VANCOCIN) IVPB 1000 mg/200 mL premix        1,000 mg 200 mL/hr over 60  Minutes Intravenous  Once 02/09/21 1010 02/09/21 1901       Time spent: 15- minutes-Greater than 50% of this time was spent in counseling, explanation of diagnosis, planning of further management, and coordination of care.  MEDICATIONS: Scheduled Meds: . sodium chloride   Intravenous Once  . acetaminophen  650 mg Oral Once  . Chlorhexidine Gluconate Cloth  6 each Topical Daily  . diphenhydrAMINE  25 mg Intravenous Once  . famotidine  20 mg Oral Daily  . feeding supplement  237 mL Oral TID BM  . gabapentin  100 mg Oral BID  . heparin  5,000 Units Subcutaneous Q8H  . mouth rinse  15 mL Mouth Rinse BID  . multivitamin  1 tablet Oral QHS  . OLANZapine  10 mg Oral QHS  . sertraline  100 mg Oral Daily  . sodium chloride flush  10-40 mL Intracatheter Q12H  . traZODone  150 mg Oral QHS   Continuous Infusions: PRN Meds:.ALPRAZolam, opium-belladonna, docusate, haloperidol lactate, HYDROmorphone (DILAUDID) injection, methocarbamol, ondansetron (ZOFRAN) IV, oxyCODONE, phenol, sodium chloride flush   PHYSICAL EXAM: Vital signs: Vitals:   02/26/21 0043 02/26/21 0433 02/26/21 0500 02/26/21 0811  BP: (!) 116/58 (!) 148/85  126/75  Pulse: 86 95  95  Resp: 16 16  (!)  22  Temp: 100.2 F (37.9 C) 100.3 F (37.9 C)  99.5 F (37.5 C)  TempSrc: Oral Oral  Oral  SpO2: 93% 90%  93%  Weight:   116.8 kg   Height:       Filed Weights   02/23/21 0745 02/23/21 1155 02/26/21 0500  Weight: 120 kg 116.3 kg 116.8 kg   Body mass index is 42.85 kg/m.   Gen Exam:Alert awake-not in any distress HEENT:atraumatic, normocephalic Chest: B/L clear to auscultation anteriorly CVS:S1S2 regular Abdomen:soft non tender, non distended Extremities:no edema Neurology: Now able to lift left leg against mild resistance Skin: no rash  I have personally reviewed following labs and imaging studies  LABORATORY DATA: CBC: Recent Labs  Lab 02/20/21 0921 02/21/21 0839 02/22/21 0042 02/23/21 0521  02/24/21 0116 02/25/21 0634  WBC 13.5* 12.0* 10.7* 11.6* 12.5* 10.5  NEUTROABS 6.4 6.8 4.4  --   --   --   HGB 7.2* 6.3* 6.9* 7.5* 8.0* 8.3*  HCT 21.2* 19.6* 21.4* 22.7* 23.8* 25.4*  MCV 88.3 90.3 90.7 89.7 89.8 88.8  PLT 284 274 262 265 293 [id number]    Basic Metabolic Panel: Recent Labs  Lab 02/22/21 0042 02/23/21 0521 02/24/21 0116 02/25/21 0634 02/26/21 0226  NA 130* 129* 132* 131* 133*  K 4.1 4.2 3.6 3.2* 3.3*  CL 97* 97* 95* 93* 97*  CO2 '23 22 28 24 27  '$ GLUCOSE 103* 104* 118* 97 98  BUN 52* 62* 32* 46* 54*  CREATININE 6.22* 7.07* 4.35* 5.37* 5.58*  CALCIUM 7.5* 7.7* 7.7* 8.2* 7.9*  PHOS  --  6.3* 4.1 5.6* 5.0*    GFR: Estimated Creatinine Clearance: 15.4 mL/min (A) (by C-G formula based on SCr of 5.58 mg/dL (H)).  Liver Function Tests: Recent Labs  Lab 02/23/21 0521 02/24/21 0116 02/25/21 0634 02/26/21 0226  ALBUMIN 1.8* 2.1* 2.2* 2.0*   No results for input(s): LIPASE, AMYLASE in the last 168 hours. No results for input(s): AMMONIA in the last 168 hours.  Coagulation Profile: No results for input(s): INR, PROTIME in the last 168 hours.  Cardiac Enzymes: No results for input(s): CKTOTAL, CKMB, CKMBINDEX, TROPONINI in the last 168 hours.  BNP (last 3 results) No results for input(s): PROBNP in the last 8760 hours.  Lipid Profile: No results for input(s): CHOL, HDL, LDLCALC, TRIG, CHOLHDL, LDLDIRECT in the last 72 hours.  Thyroid Function Tests: No results for input(s): TSH, T4TOTAL, FREET4, T3FREE, THYROIDAB in the last 72 hours.  Anemia Panel: Recent Labs    02/24/21 0116  RETICCTPCT 2.5    Urine analysis:    Component Value Date/Time   COLORURINE AMBER (A) 02/15/2021 1708   APPEARANCEUR TURBID (A) 02/15/2021 1708   LABSPEC 1.024 02/15/2021 1708   PHURINE 5.0 02/15/2021 1708   GLUCOSEU 50 (A) 02/15/2021 1708   HGBUR LARGE (A) 02/15/2021 1708   BILIRUBINUR NEGATIVE 02/15/2021 1708   KETONESUR 5 (A) 02/15/2021 1708   PROTEINUR 100 (A)  02/15/2021 1708   NITRITE NEGATIVE 02/15/2021 1708   LEUKOCYTESUR MODERATE (A) 02/15/2021 1708    Sepsis Labs: Lactic Acid, Venous    Component Value Date/Time   LATICACIDVEN 4.0 (HH) 02/09/2021 1108    MICROBIOLOGY: No results found for this or any previous visit (from the past 240 hour(s)).  RADIOLOGY STUDIES/RESULTS: No results found.   LOS: 17 days   Oren Binet, MD  [facility]    To contact the attending provider between 7A-7P or the covering provider during after hours 7P-7A, please log into  the web site [email or web address] and access using universal [place] password for that web site. If you do not have the password, please call the hospital operator.  02/26/2021, 10:59 AM

## 2021-02-26 NOTE — Progress Notes (Signed)
[facility] Progress Note  Subjective: pt seen and examined in room.  UOP 2.7L yest. SI overnight, psychiatry consulted yesterday and say inpt psych when medically cleared. No uremic symptoms today.    Vitals:   02/26/21 0043 02/26/21 0433 02/26/21 0500 02/26/21 0811  BP: (!) 116/58 (!) 148/85  126/75  Pulse: 86 95  95  Resp: 16 16  (!) 22  Temp: 100.2 F (37.9 C) 100.3 F (37.9 C)  99.5 F (37.5 C)  TempSrc: Oral Oral  Oral  SpO2: 93% 90%  93%  Weight:   116.8 kg   Height:        Exam: Gen: nad, lying in bed CV: s1s2, rrr Resp: cta b/l Abd: soft Ext: 2+ edema bilateral LEs - improving --> the L leg no longer has tense edema Neuro: awake, alert, speech clear and coherent; can dorsiflex L foot some now Dialysis Access:  RIJ TDC c/d/i    Assessment/ Plan: 1. AKI w/ CPK > 50,000 on admission. 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 She did have contrast on 3/17 so this may have dealyed chance of renal recovery thereafter (although benefits outweighed risks at the time of contrast administration). She is S/P CRRT 3/17- 3/23.  Now on iHD. I suspect with the degree of insults she sustained this will be a protracted recovery but her UOP has been improving and we are holding dialysis this weekend.   Her UOP is increasing but creatinine still trending up to 5.6 today.  Will tentatively plan for HD tomorrow but 2nd shift so MD can assess if it's needed.  2. Hypertension/volume: BP's good, b/l LE's edematous but improving.  UOP 2.7L with torsemide 100 daily.  Cont diuretic with LE edema and no s/s hypovolemia.  3. Rhabdomyolysis: was down for > 6 hrs, peak CPK > 50K on admit 3/17.  Improved 4. Hyponatremia, hypervolemic: improving as volume improvesSubstance abuse, schizophrenia, MDD: her husband is deceased following the incident.  Spiritual care involved and psychiatry consulted today again with SI - they  provided rec's 4/2. Psych inpt planned when able medically.  5. Anemia:  Hb 8.3.  Transfuse < 7 per primary.    Jannifer Hick MD [facility] Pager [phone]      Recent Labs  Lab 02/24/21 [phone] 02/25/21 0634 02/26/21 0226  K 3.6 3.2* 3.3*  BUN 32* 46* 54*  CREATININE 4.35* 5.37* 5.58*  CALCIUM 7.7* 8.2* 7.9*  PHOS 4.1 5.6* 5.0*  HGB 8.0* 8.3*  --    Inpatient medications: . sodium chloride   Intravenous Once  . acetaminophen  650 mg Oral Once  . Chlorhexidine Gluconate Cloth  6 each Topical Daily  . diphenhydrAMINE  25 mg Intravenous Once  . famotidine  20 mg Oral Daily  . feeding supplement  237 mL Oral TID BM  . gabapentin  100 mg Oral BID  . heparin  5,000 Units Subcutaneous Q8H  . mouth rinse  15 mL Mouth Rinse BID  . multivitamin  1 tablet Oral QHS  . OLANZapine  10 mg Oral QHS  . sertraline  100 mg Oral Daily  . sodium chloride flush  10-40 mL Intracatheter Q12H  . torsemide  100 mg Oral Daily  . traZODone  150 mg Oral QHS    ALPRAZolam, opium-belladonna, docusate, haloperidol lactate, HYDROmorphone (DILAUDID) injection, methocarbamol, ondansetron (ZOFRAN) IV, oxyCODONE, phenol, sodium chloride flush

## 2021-02-27 DIAGNOSIS — N179 Acute kidney failure, unspecified: Secondary | ICD-10-CM | POA: Diagnosis not present

## 2021-02-27 LAB — RENAL FUNCTION PANEL
Albumin: 2.2 g/dL — ABNORMAL LOW (ref 3.5–5.0)
Anion gap: 12 (ref 5–15)
BUN: 58 mg/dL — ABNORMAL HIGH (ref 6–20)
CO2: 25 mmol/L (ref 22–32)
Calcium: 8 mg/dL — ABNORMAL LOW (ref 8.9–10.3)
Chloride: 94 mmol/L — ABNORMAL LOW (ref 98–111)
Creatinine, Ser: 5.5 mg/dL — ABNORMAL HIGH (ref 0.44–1.00)
GFR, Estimated: 9 mL/min — ABNORMAL LOW (ref >60)
Glucose, Bld: 100 mg/dL — ABNORMAL HIGH (ref 70–99)
Phosphorus: 4.8 mg/dL — ABNORMAL HIGH (ref 2.5–4.6)
Potassium: 3.6 mmol/L (ref 3.5–5.1)
Sodium: 131 mmol/L — ABNORMAL LOW (ref 135–145)

## 2021-02-27 MED ORDER — MAGNESIUM SULFATE 2 GM/50ML IV SOLN
2.0000 g | Freq: Once | INTRAVENOUS | Status: AC
  Administered 2021-02-27: 2 g via INTRAVENOUS
  Filled 2021-02-27: qty 50

## 2021-02-27 MED ORDER — POTASSIUM CHLORIDE CRYS ER 20 MEQ PO TBCR
40.0000 meq | EXTENDED_RELEASE_TABLET | Freq: Once | ORAL | Status: AC
  Administered 2021-02-27: 40 meq via ORAL
  Filled 2021-02-27: qty 2

## 2021-02-27 NOTE — Progress Notes (Signed)
[facility] Progress Note  Subjective: Had 4.5L of urine output yesterday and Cr holding steady.  No uremic symptoms, weights coming down.      Vitals:   02/26/21 2005 02/27/21 0004 02/27/21 0400 02/27/21 0747  BP: 117/69 115/70 112/78 126/68  Pulse: 92 81 81 87  Resp: '14 16 16 17  '$ Temp: 99.6 F (37.6 C) 99.8 F (37.7 C) 98.7 F (37.1 C) 99.5 F (37.5 C)  TempSrc: Oral Oral Oral Oral  SpO2: 95% 91% 91% 94%  Weight:   113.6 kg   Height:        Exam: Gen: nad, lying in bed CV: s1s2, rrr Resp: cta b/l Abd: soft Ext: 2+ edema bilateral LEs - improving  Neuro: awake, alert, speech clear and coherent; can dorsiflex L foot some now Dialysis Access:  RIJ TDC c/d/i    Assessment/ Plan: 1. AKI w/ CPK > 50,000 on admission. 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 She did have contrast on 3/17 so this may have dealyed chance of renal recovery thereafter (although benefits outweighed risks at the time of contrast administration). She is S/P CRRT 3/17- 3/23.  Now on iHD. I suspect with the degree of insults she sustained this will be a protracted recovery but her UOP has been improving and we are holding dialysis.  - HOLD dialysis today--> appears to be autodiuresing and creatinine is holding steady. 2. Hypertension/volume: BP's good, b/l LE's edematous but improving.  Held torsemide d/t robust diuresis 3. Rhabdomyolysis: was down for > 6 hrs, peak CPK > 50K on admit 3/17.  Improved 4. Hyponatremia, hypervolemic: improving as volume improvesSubstance abuse, schizophrenia, MDD: her husband is deceased following the incident.  Spiritual care involved and psychiatry consulted today again with SI - they provided rec's 4/2. Psych inpt planned when able medically.  5. Anemia:  Hb 8.3.  Transfuse < 7 per primary.      Recent Labs  Lab 02/24/21 0116 02/25/21 0634 02/26/21 0226 02/27/21 0103  K 3.6 3.2* 3.3*  3.6  BUN 32* 46* 54* 58*  CREATININE 4.35* 5.37* 5.58* 5.50*  CALCIUM 7.7* 8.2* 7.9* 8.0*  PHOS 4.1 5.6* 5.0* 4.8*  HGB 8.0* 8.3*  --   --    Inpatient medications: . sodium chloride   Intravenous Once  . acetaminophen  650 mg Oral Once  . Chlorhexidine Gluconate Cloth  6 each Topical Daily  . diphenhydrAMINE  25 mg Intravenous Once  . famotidine  20 mg Oral Daily  . feeding supplement  237 mL Oral TID BM  . gabapentin  100 mg Oral BID  . heparin  5,000 Units Subcutaneous Q8H  . mouth rinse  15 mL Mouth Rinse BID  . multivitamin  1 tablet Oral QHS  . OLANZapine  10 mg Oral QHS  . potassium chloride  40 mEq Oral Once  . sertraline  100 mg Oral Daily  . sodium chloride flush  10-40 mL Intracatheter Q12H  . traZODone  150 mg Oral QHS   . magnesium sulfate bolus IVPB     ALPRAZolam, opium-belladonna, docusate, haloperidol lactate, HYDROmorphone (DILAUDID) injection, methocarbamol, ondansetron (ZOFRAN) IV, oxyCODONE, phenol, sodium chloride flush

## 2021-02-27 NOTE — Progress Notes (Signed)
PROGRESS NOTE        PATIENT DETAILS Name: Renee Wilkinson Age: 50 y.o. Sex: female Date of Birth: 09/19/1971 Admit Date: 02/09/2021 Admitting Physician Lanier Clam, MD BP:[id number], No Pcp Per (Inactive)  Brief Narrative: Patient is a 50 y.o. female with history of schizophrenia, depression/anxiety substance abuse-who was found down beside her husband (found dead by EMS)-upon presentation to the ED she was found to have acute toxic encephalopathy due to drug overdose-along with AKI, hyperkalemia, shock liver, CT imaging positive for renal infarcts-patient was intubated and admitted to the ICU.  She was started on CRRT.  Upon further stability-she was transferred to the [facility] service on 3/25.  Significant events: 3/17>> admit to ICU-intubated-started CRRT 3/25>> transferred to [facility]  Significant studies: 3/17>> UDS: Negative 3/17>> CT head: No acute abnormalities. 3/17>> CT angio chest/abdomen: No aorticdissection,b/l renal infarcts 3/18>> RUQ ultrasound: Cholelithiasis 3/19>> Echo: EF 60-65% 3/21>> RUQ ultrasound: Cholelithiasis and sludge-no evidence of acute cholecystitis. 3/21>> MRI C-spine: Myositis-right posterior paraspinous muscles without fluid collection. 3/21>> MRI T-spine: Myositis involving posterior paraspinous muscle 3/21>> MRI L-spine: Right posterior muscle edema throughout lumbar spine 3/23>> renal ultrasound: No hydronephrosis 3/20>> CT C-spine: No bone/disc space infection 3/20>> CT LS spine: No spinal infection 3/20>> CT abdomen/pelvis: High-grade SBO transition point in the ventral abdominal wall hernia. 3/22>> CT abdomen/pelvis: Persistent dilated small bowel loops-consistent with partial SBO. 3/22>> HIDA scan: Delayed gallbladder filling-patency of cystic duct. 3/24>> left lower extremity Doppler: No DVT  Antimicrobial therapy: See below  Microbiology data: 3/17>> blood culture: No growth x5 days  Procedures : 3/17>> R IJ  HD cath 3/17 ETT>> 3/17 3/30>> tunneled HD catheter placement  Consults: CCM, nephrology, neurology, orthopedics, psychiatry, general surgery  DVT Prophylaxis : heparin injection 5,000 Units Start: 02/14/21 2200   Subjective: Left leg is slowly improving-continues to be tearful-acknowledges ongoing auditory hallucinations-feels that the "voices are my own"  Assessment/Plan: AKI: Multifactorial etiology-due to rhabdomyolysis, contrast-induced nephropathy, bilateral renal infarcts-underwent CRRT from 3/17-3/23-and then received intermittent HD at the discretion of nephrology.  Continue to monitor closely-for signs of renal recovery.  Acute toxic encephalopathy: Due to drug overdose-mental status is much better.  Shock liver: Improving  Acute hypoxic respiratory failure: Intubated on admission-currently stable on room air.  Small bowel obstruction: Treated with conservative measures-has resolved.   Chronic cholecystitis: No evidence for acute cystitis-see imaging studies above.  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  Neurology has signed off  Normocytic anemia: Probably due to acute illness-AKI-no evidence of blood loss-CBC stable.  Did receive PRBC transfusion during the earlier part of this hospital stay.  Hyponatremia: Mild-probably will improve post HD-recheck electrolytes tomorrow.  Drug overdose  Bereavement/grief/schizophrenia/depression: Evaluated by psychiatry-continue Zyprexa-remains on trazodone/Zoloft-safety sitter at bedside.  Per psychiatry note on 4/2-patient requires inpatient psychiatric admission when medically stable.  She continues to be tearful-acknowledges auditory hallucinations-have reconsulted psychiatry on 4/4.  Morbid Obesity: Estimated body mass index is  41.68 kg/m as calculated from the following:   Height as of this encounter: '5\' 5"'$  (1.651 m).   Weight as of this encounter: 113.6 kg.     Diet: Diet Order            Diet renal  with fluid restriction Fluid restriction: 1200 mL Fluid; Room service appropriate? Yes; Fluid consistency: Thin  Diet effective now                  Code Status: Full code  Family Communication: None at bedside   Disposition Plan: Status is: Inpatient  Remains inpatient appropriate because:Inpatient level of care appropriate due to severity of illness   Dispo: The patient is from: Home              Anticipated d/c is to: SNF              Patient currently is not medically stable to d/c.   Difficult to place patient No   Barriers to Discharge: Renal failure requiring intermittent HD-awaiting renal recovery.  Antimicrobial agents: Anti-infectives (From admission, onward)   Start     Dose/Rate Route Frequency Ordered Stop   02/22/21 1015  ceFAZolin (ANCEF) powder 2 g  Status:  Discontinued        2 g Other To Surgery 02/22/21 0922 02/22/21 0945   02/22/21 1000  ceFAZolin (ANCEF) IVPB 2g/100 mL premix        2 g 200 mL/hr over 30 Minutes Intravenous To Radiology 02/22/21 0945 02/22/21 1250   02/16/21 1000  piperacillin-tazobactam (ZOSYN) IVPB 2.25 g  Status:  Discontinued        2.25 g 100 mL/hr over 30 Minutes Intravenous Every 8 hours 02/16/21 0720 02/17/21 1047   02/13/21 1400  piperacillin-tazobactam (ZOSYN) IVPB 3.375 g  Status:  Discontinued        3.375 g 12.5 mL/hr over 240 Minutes Intravenous Every 8 hours 02/13/21 0826 02/16/21 0720   02/13/21 0815  piperacillin-tazobactam (ZOSYN) IVPB 3.375 g        3.375 g 100 mL/hr over 30 Minutes Intravenous  Once 02/13/21 0733 02/13/21 0820   02/09/21 1015  ceFEPIme (MAXIPIME) 2 g in sodium chloride 0.9 % 100 mL IVPB        2 g 200 mL/hr over 30 Minutes Intravenous  Once 02/09/21 1010 02/09/21 1112   02/09/21 1015  vancomycin (VANCOCIN) IVPB  1000 mg/200 mL premix        1,000 mg 200 mL/hr over 60 Minutes Intravenous  Once 02/09/21 1010 02/09/21 1901       Time spent: 15- minutes-Greater than 50% of this time was spent in counseling, explanation of diagnosis, planning of further management, and coordination of care.  MEDICATIONS: Scheduled Meds: . sodium chloride   Intravenous Once  . acetaminophen  650 mg Oral Once  . Chlorhexidine Gluconate Cloth  6 each Topical Daily  . diphenhydrAMINE  25 mg Intravenous Once  . famotidine  20 mg Oral Daily  . feeding supplement  237 mL Oral TID BM  . gabapentin  100 mg Oral BID  . heparin  5,000 Units Subcutaneous Q8H  . mouth rinse  15 mL Mouth Rinse BID  . multivitamin  1 tablet Oral QHS  . OLANZapine  10 mg Oral QHS  . sertraline  100 mg Oral Daily  . sodium chloride flush  10-40 mL Intracatheter Q12H  . traZODone  150 mg Oral QHS   Continuous Infusions: PRN Meds:.ALPRAZolam, opium-belladonna, docusate, haloperidol lactate, HYDROmorphone (DILAUDID) injection, methocarbamol, ondansetron (ZOFRAN) IV, oxyCODONE, phenol, sodium chloride flush   PHYSICAL EXAM: Vital signs: Vitals:   02/26/21 2005 02/27/21 0004 02/27/21 0400 02/27/21 0747  BP: 117/69 115/70 112/78 126/68  Pulse: 92 81 81 87  Resp: '14 16 16 '$ 17  Temp: 99.6 F (37.6 C) 99.8 F (37.7 C) 98.7 F (37.1 C) 99.5 F (37.5 C)  TempSrc: Oral Oral Oral Oral  SpO2: 95% 91% 91% 94%  Weight:   113.6 kg   Height:       Filed Weights   02/23/21 1155 02/26/21 0500 02/27/21 0400  Weight: 116.3 kg 116.8 kg 113.6 kg   Body mass index is 41.68 kg/m.   Gen Exam:Alert awake-not in any distress HEENT:atraumatic, normocephalic Chest: B/L clear to auscultation anteriorly CVS:S1S2 regular Abdomen:soft non tender, non distended Extremities:no edema Neurology: Now able to lift left leg against mild resistance Skin: no rash  I have personally reviewed following labs and imaging studies  LABORATORY DATA: CBC: Recent  Labs  Lab 02/21/21 0839 02/22/21 0042 02/23/21 0521 02/24/21 0116 02/25/21 0634  WBC 12.0* 10.7* 11.6* 12.5* 10.5  NEUTROABS 6.8 4.4  --   --   --   HGB 6.3* 6.9* 7.5* 8.0* 8.3*  HCT 19.6* 21.4* 22.7* 23.8* 25.4*  MCV 90.3 90.7 89.7 89.8 88.8  PLT 274 262 265 293 [id number]    Basic Metabolic Panel: Recent Labs  Lab 02/23/21 0521 02/24/21 0116 02/25/21 0634 02/26/21 0226 02/27/21 0103  NA 129* 132* 131* 133* 131*  K 4.2 3.6 3.2* 3.3* 3.6  CL 97* 95* 93* 97* 94*  CO2 '22 28 24 27 25  '$ GLUCOSE 104* 118* 97 98 100*  BUN 62* 32* 46* 54* 58*  CREATININE 7.07* 4.35* 5.37* 5.58* 5.50*  CALCIUM 7.7* 7.7* 8.2* 7.9* 8.0*  PHOS 6.3* 4.1 5.6* 5.0* 4.8*    GFR: Estimated Creatinine Clearance: 15.4 mL/min (A) (by C-G formula based on SCr of 5.5 mg/dL (H)).  Liver Function Tests: Recent Labs  Lab 02/23/21 0521 02/24/21 0116 02/25/21 0634 02/26/21 0226 02/27/21 0103  ALBUMIN 1.8* 2.1* 2.2* 2.0* 2.2*   No results for input(s): LIPASE, AMYLASE in the last 168 hours. No results for input(s): AMMONIA in the last 168 hours.  Coagulation Profile: No results for input(s): INR, PROTIME in the last 168 hours.  Cardiac Enzymes: No results for input(s): CKTOTAL, CKMB, CKMBINDEX, TROPONINI in the last 168 hours.  BNP (last 3 results) No results for input(s): PROBNP in the last 8760 hours.  Lipid Profile: No results for input(s): CHOL, HDL, LDLCALC, TRIG, CHOLHDL, LDLDIRECT in the last 72 hours.  Thyroid Function Tests: No results for input(s): TSH, T4TOTAL, FREET4, T3FREE, THYROIDAB in the last 72 hours.  Anemia Panel: No results for input(s): VITAMINB12, FOLATE, FERRITIN, TIBC, IRON, RETICCTPCT in the last 72 hours.  Urine analysis:    Component Value Date/Time   COLORURINE AMBER (A) 02/15/2021 1708   APPEARANCEUR TURBID (A) 02/15/2021 1708   LABSPEC 1.024 02/15/2021 1708   PHURINE 5.0 02/15/2021 1708   GLUCOSEU 50 (A) 02/15/2021 1708   HGBUR LARGE (A) 02/15/2021 1708    BILIRUBINUR NEGATIVE 02/15/2021 1708   KETONESUR 5 (A) 02/15/2021 1708   PROTEINUR 100 (A) 02/15/2021 1708   NITRITE NEGATIVE 02/15/2021 1708   LEUKOCYTESUR MODERATE (A) 02/15/2021 1708    Sepsis Labs: Lactic Acid, Venous    Component Value Date/Time   LATICACIDVEN 4.0 (HH) 02/09/2021 1108    MICROBIOLOGY: No results found for this or any previous visit (from the past 240 hour(s)).  RADIOLOGY STUDIES/RESULTS: No results found.   LOS: 18 days   Oren Binet, MD  [facility]    To contact the attending provider between 7A-7P or the covering provider during after hours 7P-7A, please log into the web site  [email or web address] and access using universal [place] password for that web site. If you do not have the password, please call the hospital operator.  02/27/2021, 12:24 PM

## 2021-02-27 NOTE — TOC Progression Note (Addendum)
Transition of Care [facility]) - Progression Note    Patient Details  Name: Renee Wilkinson MRN: [id number] Date of Birth: 1971-06-11  Transition of Care [facility]) CM/SW [place], LCSW Phone Number: 02/27/2021, 12:52 PM  Clinical Narrative:    9am-CSW notes patient now recommended for inpatient psych placement. Will follow for medical stability.  12:50pm-CSW now notes patient has been cleared by psych. CSW received call back from [facility] with [facility]. They have declined patient for acute level rehab but provided CSW with contact info for [facility]) subacute rehab center and will send patient's clinicals to [facility] [place]. CSW spoke with admissions at [facility] and she confirmed contact number of [phone] and fax # [phone]. [facility] is not able to accept dialysis patients so will continue to follow.    4pm-CSW received return call from [facility]. She reported they are not in network with patient's insurance. CSW spoke with patient's daughter [name] and provided an update. Patient's family is in [place] in [place].    Expected Discharge Plan: IP Rehab Facility Barriers to Discharge: Continued Medical Work up,Inadequate or no insurance,Active Substance Use - Placement,No SNF bed  Expected Discharge Plan and Services Expected Discharge Plan: [place] In-house Referral: Clinical Social Work Discharge Planning Services: CM Consult   Living arrangements for the past 2 months: Hotel/Motel                                       Social Determinants of Health (SDOH) Interventions    Readmission Risk Interventions No flowsheet data found.

## 2021-02-27 NOTE — Progress Notes (Signed)
Physical Therapy Treatment Patient Details Name: Renee Wilkinson MRN: [id number] DOB: 01-23-1971 Today's Date: 02/27/2021    History of Present Illness Pt is a 50 yo female, admitted to [facility] on 3/17 after being found down (next to deceased spouse) after smoking crack cocaine and now with AKI, hyperkalemia, rhabdomyolysis, and leukocytosis.  Pt with hx of substance abuse.    PT Comments    Pt is in bed eager to get up with therapy and is thankful for opportunity to walk into bathroom to use the toilet. Pt continues to be limited in safe mobility by decreased cognition, especially safety awareness and understanding of deficits. Pt is min guard for bed mobility and transfers and min A for ambulation with RW. Pt requires increased cuing for monitoring onset of pain and instability pain creates in her L LE. PT continues to recommend CIR level therapy to progress safe mobility and better understanding of deficits. PT will continue to follow acutely.   Follow Up Recommendations  CIR     Equipment Recommendations  None recommended by PT       Precautions / Restrictions Precautions Precautions: Fall Restrictions Weight Bearing Restrictions: No    Mobility  Bed Mobility Overal bed mobility: Needs Assistance Bed Mobility: Supine to Sit     Supine to sit: Min guard     General bed mobility comments: increased time and effort for moving L LE    Transfers Overall transfer level: Needs assistance Equipment used: Rolling walker (2 wheeled) Transfers: Sit to/from [facility] Sit to Stand: Min guard         General transfer comment: min guard for sit to stand, increased time and effort required, vc for hand placement and upright posture  Ambulation/Gait Ambulation/Gait assistance: Min assist Gait Distance (Feet): 40 Feet (2x10, 1x20) Assistive device: Rolling walker (2 wheeled) Gait Pattern/deviations: Step-to pattern;Decreased step length - right;Decreased step length  - left;Decreased stance time - left;Antalgic Gait velocity: slowed Gait velocity interpretation: <1.31 ft/sec, indicative of household ambulator General Gait Details: light min A for safety, and close chair follow due to increased L LE pain and pt inability to regulate brakes based on increased pain until the pain is severe and she needs to sit immediately         Balance Overall balance assessment: Needs assistance Sitting-balance support: Feet supported;No upper extremity supported;Bilateral upper extremity supported;Single extremity supported Sitting balance-Leahy Scale: Fair     Standing balance support: Bilateral upper extremity supported;During functional activity;Single extremity supported Standing balance-Leahy Scale: Poor Standing balance comment: requires at least single UE                            Cognition Arousal/Alertness: Awake/alert Behavior During Therapy: WFL for tasks assessed/performed;Anxious Overall Cognitive Status: No family/caregiver present to determine baseline cognitive functioning Area of Impairment: Awareness;Problem solving;Attention                   Current Attention Level: Selective;Alternating       Awareness: Emergent Problem Solving: Difficulty sequencing;Requires tactile cues;Requires verbal cues General Comments: pt continues to be easily distracted and needs cuing to stay on task at hand         General Comments General comments (skin integrity, edema, etc.): VSS on RA      Pertinent Vitals/Pain Pain Assessment: Faces Faces Pain Scale: Hurts even more Pain Location: L LE with dependent position and weightbearing Pain Descriptors / Indicators: Guarding;Sore;Spasm;Numbness Pain Intervention(s): Limited  activity within patient's tolerance;Monitored during session;Repositioned           PT Goals (current goals can now be found in the care plan section) Acute Rehab PT Goals Patient Stated Goal: pain control,  feel better PT Goal Formulation: With patient Time For Goal Achievement: 02/26/21 Potential to Achieve Goals: Good Progress towards PT goals: Progressing toward goals    Frequency    Min 3X/week      PT Plan Current plan remains appropriate       AM-PAC PT "6 Clicks" Mobility   Outcome Measure  Help needed turning from your back to your side while in a flat bed without using bedrails?: A Lot Help needed moving from lying on your back to sitting on the side of a flat bed without using bedrails?: A Lot Help needed moving to and from a bed to a chair (including a wheelchair)?: A Lot Help needed standing up from a chair using your arms (e.g., wheelchair or bedside chair)?: A Lot Help needed to walk in hospital room?: A Lot Help needed climbing 3-5 steps with a railing? : Total 6 Click Score: 11    End of Session Equipment Utilized During Treatment: Gait belt Activity Tolerance: Patient limited by pain Patient left: with call bell/phone within reach;in chair;with chair alarm set;with nursing/sitter in room Nurse Communication: Mobility status PT Visit Diagnosis: Muscle weakness (generalized) (M62.81);Pain;History of falling (Z91.81);Difficulty in walking, not elsewhere classified (R26.2) Pain - Right/Left: Left Pain - part of body: Leg     Time: [id number] PT Time Calculation (min) (ACUTE ONLY): 28 min  Charges:  $Gait Training: 8-22 mins $Therapeutic Activity: 8-22 mins                     Cotey Rakes B. Migdalia Dk PT, DPT Acute Rehabilitation Services Pager [phone] Office [phone]    [place] 02/27/2021, 5:04 PM

## 2021-02-27 NOTE — Consult Note (Signed)
[facility] Face-to-Face Psychiatry Consult   Reason for Consult: depression-grief (lost spouse)-still w auditory hallucinations-please follow and assist in medication optimization Referring Physician:  Oren Binet, MD Patient Identification: Renee Wilkinson MRN:  [id number] Principal Diagnosis: Schizoaffective disorder, depressive type ([place]) Diagnosis:  Principal Problem:   Schizoaffective disorder, depressive type ([place]) Active Problems:   Renal failure   Encounter for central line placement   Polysubstance dependence ([place])   Total Time spent with patient: 30 minutes  Subjective:   ''Im ok. I just keeping hearing me in my head. I am struggling with the thoughts in my own head. They wont turn off''  HPI: Patient is a 50 year old female who reports history of Schizophrenia, Major depression,Anxiety  and polysubstance use (opiates, cocaine, marijuana and tobacco) who was admitted to the hospital due to acute encephalopathy secondary to drug overdose.  Patient is seen and chart reviewed.  She is observed to be sitting upright in bed, difficult to arouse and required physical shaking in an effort to wake up patient for evaluation.  Upon awakening she was appropriate, and engaged with this nurse practitioner until she fell back asleep.  Patient is able to describe her hallucinations as "my own thoughts in my head.  It is like I hear my voice in my head.  It will not turn off ."  Patient is able to clarify her ruminating thoughts versus auditory and visual hallucinations.  She is able to answer all questions appropriately.  She does not appear to be responding to internal and or external stimuli.  She does not appear to be exhibiting any acute psychosis, paranoia, and or delusional thinking.  She denies any current suicidal ideations, homicidal ideations . She does continue to endorse depressive symptoms, and difficulty processing the grief and loss of her husband.  She appears to have she states that she  has been getting increasingly more depressed characterized by low. She is currently taking zyprexa '10mg'$  po daily and zoloft '100mg'$  po daily, denies any side effects at this time.  Past Psychiatric History: as above  Risk to Self:  recurrent suicidal thought Risk to Others:  denies Prior Inpatient Therapy:  multiple in the past Prior Outpatient Therapy:  yes  Past Medical History: as above  Family History: History reviewed. No pertinent family history. Family Psychiatric  History:  Social History:  Social History   Substance and Sexual Activity  Alcohol Use None     Social History   Substance and Sexual Activity  Drug Use Not on file    Social History   Socioeconomic History  . Marital status: Widowed    Spouse name: Not on file  . Number of children: Not on file  . Years of education: Not on file  . Highest education level: Not on file  Occupational History  . Not on file  Tobacco Use  . Smoking status: Not on file  . Smokeless tobacco: Not on file  Substance and Sexual Activity  . Alcohol use: Not on file  . Drug use: Not on file  . Sexual activity: Not on file  Other Topics Concern  . Not on file  Social History Narrative  . Not on file   Social Determinants of Health   Financial Resource Strain: Not on file  Food Insecurity: Not on file  Transportation Needs: Not on file  Physical Activity: Not on file  Stress: Not on file  Social Connections: Not on file   Additional Social History:    Allergies:  No  Known Allergies  Labs:  Results for orders placed or performed during the hospital encounter of 02/09/21 (from the past 48 hour(s))  Renal function panel     Status: Abnormal   Collection Time: 02/26/21  2:26 AM  Result Value Ref Range   Sodium 133 (L) 135 - 145 mmol/L   Potassium 3.3 (L) 3.5 - 5.1 mmol/L   Chloride 97 (L) 98 - 111 mmol/L   CO2 27 22 - 32 mmol/L   Glucose, Bld 98 70 - 99 mg/dL    Comment: Glucose reference range applies only to  samples taken after fasting for at least 8 hours.   BUN 54 (H) 6 - 20 mg/dL   Creatinine, Ser 5.58 (H) 0.44 - 1.00 mg/dL   Calcium 7.9 (L) 8.9 - 10.3 mg/dL   Phosphorus 5.0 (H) 2.5 - 4.6 mg/dL   Albumin 2.0 (L) 3.5 - 5.0 g/dL   GFR, Estimated 9 (L) >60 mL/min    Comment: (NOTE) Calculated using the CKD-EPI Creatinine Equation (2021)    Anion gap 9 5 - 15    Comment: Performed at [facility] [address], [place], [place] [address]  Renal function panel     Status: Abnormal   Collection Time: 02/27/21  1:03 AM  Result Value Ref Range   Sodium 131 (L) 135 - 145 mmol/L   Potassium 3.6 3.5 - 5.1 mmol/L   Chloride 94 (L) 98 - 111 mmol/L   CO2 25 22 - 32 mmol/L   Glucose, Bld 100 (H) 70 - 99 mg/dL    Comment: Glucose reference range applies only to samples taken after fasting for at least 8 hours.   BUN 58 (H) 6 - 20 mg/dL   Creatinine, Ser 5.50 (H) 0.44 - 1.00 mg/dL   Calcium 8.0 (L) 8.9 - 10.3 mg/dL   Phosphorus 4.8 (H) 2.5 - 4.6 mg/dL   Albumin 2.2 (L) 3.5 - 5.0 g/dL   GFR, Estimated 9 (L) >60 mL/min    Comment: (NOTE) Calculated using the CKD-EPI Creatinine Equation (2021)    Anion gap 12 5 - 15    Comment: Performed at [facility] [address] [place], [place] [address]    Current Facility-Administered Medications  Medication Dose Route Frequency Provider Last Rate Last Admin  . 0.9 %  sodium chloride infusion (Manually program via Guardrails IV Fluids)   Intravenous Once Frederik Pear, MD      . acetaminophen (TYLENOL) tablet 650 mg  650 mg Oral Once Jonetta Osgood, MD      . ALPRAZolam Duanne Moron) tablet 0.5 mg  0.5 mg Oral TID PRN Blenda Nicely, RPH   0.5 mg at 02/27/21 0818  . belladonna-opium (B&O) suppository 16.2-'60mg'$   1 suppository Rectal Q8H PRN Anders Simmonds, MD      . Chlorhexidine Gluconate Cloth 2 % PADS 6 each  6 each Topical Daily Juanito Doom, MD   6 each at 02/27/21 [phone]  . diphenhydrAMINE (BENADRYL) injection 25 mg  25 mg  Intravenous Once Ghimire, Henreitta Leber, MD      . docusate (COLACE) 50 MG/5ML liquid 100 mg  100 mg Oral BID PRN Blenda Nicely, RPH   100 mg at 02/27/21 0208  . famotidine (PEPCID) tablet 20 mg  20 mg Oral Daily Blenda Nicely, RPH   20 mg at 02/27/21 0818  . feeding supplement (ENSURE ENLIVE / ENSURE PLUS) liquid 237 mL  237 mL Oral TID BM Alekh,  Kshitiz, MD   237 mL at 02/27/21 0823  . gabapentin (NEURONTIN) capsule 100 mg  100 mg Oral BID Starla Link, Kshitiz, MD   100 mg at 02/27/21 0818  . haloperidol lactate (HALDOL) injection 1-2 mg  1-2 mg Intravenous Q6H PRN Alekh, Kshitiz, MD      . heparin injection 5,000 Units  5,000 Units Subcutaneous Q8H Allred, Darrell K, PA-C   5,000 Units at 02/27/21 0549  . HYDROmorphone (DILAUDID) injection 0.5 mg  0.5 mg Intravenous Q4H PRN Aline August, MD   0.5 mg at 02/27/21 0604  . MEDLINE mouth rinse  15 mL Mouth Rinse BID Hunsucker, Bonna Gains, MD   15 mL at 02/27/21 0823  . methocarbamol (ROBAXIN) tablet 750 mg  750 mg Oral Q6H PRN Aline August, MD   750 mg at 02/27/21 0139  . multivitamin (RENA-VIT) tablet 1 tablet  1 tablet Oral QHS Jonetta Osgood, MD   1 tablet at 02/26/21 2242  . OLANZapine (ZYPREXA) tablet 10 mg  10 mg Oral QHS Suella Broad, FNP   10 mg at 02/26/21 2243  . ondansetron (ZOFRAN) injection 4 mg  4 mg Intravenous Q6H PRN Hunsucker, Bonna Gains, MD   4 mg at 02/12/21 1452  . oxyCODONE (Oxy IR/ROXICODONE) immediate release tablet 5-10 mg  5-10 mg Oral Q4H PRN Aline August, MD   10 mg at 02/27/21 0818  . phenol (CHLORASEPTIC) mouth spray 1 spray  1 spray Mouth/Throat PRN Spero Geralds, MD   1 spray at 02/15/21 [phone]  . sertraline (ZOLOFT) tablet 100 mg  100 mg Oral Daily Akintayo, Mojeed, MD   100 mg at 02/27/21 0818  . sodium chloride flush (NS) 0.9 % injection 10-40 mL  10-40 mL Intracatheter Q12H Hunsucker, Bonna Gains, MD   10 mL at 02/27/21 0823  . sodium chloride flush (NS) 0.9 % injection 10-40 mL  10-40 mL Intracatheter PRN  Hunsucker, Bonna Gains, MD   10 mL at 02/18/21 2152  . traZODone (DESYREL) tablet 150 mg  150 mg Oral QHS Juanito Doom, MD   150 mg at 02/26/21 2242    Musculoskeletal: Strength & Muscle Tone: not tested Gait & Station: unsteady Patient leans: N/A    Psychiatric Specialty Exam:  Presentation  General Appearance: Appropriate for Environment; Casual  Eye Contact:Minimal  Speech:Clear and Coherent; Slow  Speech Volume:Decreased  Handedness:Right   Mood and Affect  Mood:Depressed  Affect:Depressed; Constricted   Thought Process  Thought Processes:Coherent; Linear; Other (comment)  Descriptions of Associations:Tangential  Orientation:Full (Time, Place and Person)  Thought Content:Rumination; Scattered  History of Schizophrenia/Schizoaffective disorder:No data recorded Duration of Psychotic Symptoms:No data recorded Hallucinations:Hallucinations: None  Ideas of Reference:None  Suicidal Thoughts:Suicidal Thoughts: No SI Passive Intent and/or Plan: Without Intent; Without Plan; Without Means to Carry Out  Homicidal Thoughts:Homicidal Thoughts: No   Sensorium  Memory:Immediate Fair; Recent Fair; Remote Fair  Judgment:Poor  Insight:Lacking   Executive Functions  Concentration:Poor  Attention Span:Fair  [place]   Psychomotor Activity  Psychomotor Activity:Psychomotor Activity: Psychomotor Retardation   Assets  Assets:Desire for Improvement; Communication Skills; Leisure Time; Social Support; Physical Health; Resilience   Sleep  Sleep:Sleep: Poor   Physical Exam: Physical Exam Psychiatric:        Attention and Perception: Attention normal. She does not perceive auditory or visual hallucinations.        Mood and Affect: Mood is depressed.        Speech: Speech normal.  Behavior: Behavior is cooperative.        Thought Content: Thought content normal. Thought content does not include  suicidal ideation.        Cognition and Memory: Cognition and memory normal.        Judgment: Judgment normal.    Review of Systems  Constitutional: Positive for malaise/fatigue.  HENT: Negative.   Eyes: Negative.   Respiratory: Negative.   Cardiovascular: Positive for leg swelling.  Gastrointestinal: Negative.   Skin: Negative.   Psychiatric/Behavioral: Positive for depression. Negative for hallucinations and suicidal ideas. The patient is nervous/anxious and has insomnia.    Blood pressure 126/68, pulse 87, temperature 99.5 F (37.5 C), temperature source Oral, resp. rate 17, height '5\' 5"'$  (1.651 m), weight 113.6 kg, SpO2 94 %. Body mass index is 41.68 kg/m.  Treatment Plan Summary: 50 year old female who reports history of Schizophrenia, Major depression,Anxiety  and polysubstance use (opiates, cocaine, marijuana and tobacco). Patient is no longer endorsing suicidal ideations. It appears her mentation and cognitive impairment continue to wax and wane. At this time she is exhibiting ruminating and intrusive thoughts as it relates to her negative traumatic experience she endured prior to this admission. She will benefit from grief loss and trauma focus therapy, both of which take place best in outpatient setting. She remains on two psychotropic medications that are effective in treating her depression, anxiety, PTSD, and psychosis.   Recommendations: -Continue 1:1 Sitter for safety -Continue Zyprexa 10 mg at bedtime for psychosis -Continue  Zoloft '100mg'$  po daily for anxiety/ Depression/PTSD Continue to refrain from use of benzodiazepines, as patient has history of substance use.   At this time patient will not benefit from inpatient psychiatric admission. It is clear from chart notes and patient evaluation, that her safety and suicide risk continues to fluctuate.  She will benefit from intensive outpatient programming that also targets trauma focused therapy. Patient continues to have a  long road to recovery, and encourage physical rehabilitation with focus on neurocognitive therapy, consider CIR referral if appropriate.   Disposition: No evidence of imminent risk to self or others at present.   Patient does not meet criteria for psychiatric inpatient admission. Supportive therapy provided about ongoing stressors. Psychiatric service is signing out. Re-consult as needed  Suella Broad, FNP 02/27/2021 12:28 PM

## 2021-02-28 ENCOUNTER — Inpatient Hospital Stay (HOSPITAL_COMMUNITY): Payer: Medicaid - Out of State

## 2021-02-28 LAB — CBC WITH DIFFERENTIAL/PLATELET
Abs Immature Granulocytes: 0.13 10*3/uL — ABNORMAL HIGH (ref 0.00–0.07)
Basophils Absolute: 0 10*3/uL (ref 0.0–0.1)
Basophils Relative: 1 %
Eosinophils Absolute: 0 10*3/uL (ref 0.0–0.5)
Eosinophils Relative: 0 %
HCT: 20.2 % — ABNORMAL LOW (ref 36.0–46.0)
Hemoglobin: 6.7 g/dL — CL (ref 12.0–15.0)
Immature Granulocytes: 2 %
Lymphocytes Relative: 52 %
Lymphs Abs: 4.7 10*3/uL — ABNORMAL HIGH (ref 0.7–4.0)
MCH: 29.9 pg (ref 26.0–34.0)
MCHC: 33.2 g/dL (ref 30.0–36.0)
MCV: 90.2 fL (ref 80.0–100.0)
Monocytes Absolute: 0.9 10*3/uL (ref 0.1–1.0)
Monocytes Relative: 10 %
Neutro Abs: 3.1 10*3/uL (ref 1.7–7.7)
Neutrophils Relative %: 35 %
Platelets: 229 10*3/uL (ref 150–400)
RBC: 2.24 MIL/uL — ABNORMAL LOW (ref 3.87–5.11)
RDW: 14.1 % (ref 11.5–15.5)
WBC: 8.9 10*3/uL (ref 4.0–10.5)
nRBC: 0 % (ref 0.0–0.2)

## 2021-02-28 LAB — HEMOGLOBIN AND HEMATOCRIT, BLOOD
HCT: 28.1 % — ABNORMAL LOW (ref 36.0–46.0)
Hemoglobin: 9.4 g/dL — ABNORMAL LOW (ref 12.0–15.0)

## 2021-02-28 LAB — RENAL FUNCTION PANEL
Albumin: 2.2 g/dL — ABNORMAL LOW (ref 3.5–5.0)
Anion gap: 10 (ref 5–15)
BUN: 60 mg/dL — ABNORMAL HIGH (ref 6–20)
CO2: 28 mmol/L (ref 22–32)
Calcium: 8.3 mg/dL — ABNORMAL LOW (ref 8.9–10.3)
Chloride: 95 mmol/L — ABNORMAL LOW (ref 98–111)
Creatinine, Ser: 5.31 mg/dL — ABNORMAL HIGH (ref 0.44–1.00)
GFR, Estimated: 9 mL/min — ABNORMAL LOW (ref >60)
Glucose, Bld: 107 mg/dL — ABNORMAL HIGH (ref 70–99)
Phosphorus: 4.5 mg/dL (ref 2.5–4.6)
Potassium: 3.9 mmol/L (ref 3.5–5.1)
Sodium: 133 mmol/L — ABNORMAL LOW (ref 135–145)

## 2021-02-28 LAB — CBC
HCT: 21.4 % — ABNORMAL LOW (ref 36.0–46.0)
Hemoglobin: 7 g/dL — ABNORMAL LOW (ref 12.0–15.0)
MCH: 29.8 pg (ref 26.0–34.0)
MCHC: 32.7 g/dL (ref 30.0–36.0)
MCV: 91.1 fL (ref 80.0–100.0)
Platelets: 240 10*3/uL (ref 150–400)
RBC: 2.35 MIL/uL — ABNORMAL LOW (ref 3.87–5.11)
RDW: 14.2 % (ref 11.5–15.5)
WBC: 10.3 10*3/uL (ref 4.0–10.5)
nRBC: 0 % (ref 0.0–0.2)

## 2021-02-28 LAB — IRON AND TIBC
Iron: 36 ug/dL (ref 28–170)
Saturation Ratios: 12 % (ref 10.4–31.8)
TIBC: 311 ug/dL (ref 250–450)
UIBC: 275 ug/dL

## 2021-02-28 LAB — RETICULOCYTES
Immature Retic Fract: 29.8 % — ABNORMAL HIGH (ref 2.3–15.9)
RBC.: 2.47 MIL/uL — ABNORMAL LOW (ref 3.87–5.11)
Retic Count, Absolute: 66.7 10*3/uL (ref 19.0–186.0)
Retic Ct Pct: 2.7 % (ref 0.4–3.1)

## 2021-02-28 LAB — VITAMIN B12: Vitamin B-12: 395 pg/mL (ref 180–914)

## 2021-02-28 LAB — FOLATE: Folate: 23.8 ng/mL (ref >5.9)

## 2021-02-28 LAB — FERRITIN: Ferritin: 188 ng/mL (ref 11–307)

## 2021-02-28 LAB — PREPARE RBC (CROSSMATCH)

## 2021-02-28 IMAGING — DX DG TIBIA/FIBULA 2V*L*
1 series · 3 of 3 positions shown · non-contrast
Comparison: None.

CLINICAL DATA: Pain

EXAM:
LEFT TIBIA AND FIBULA - 2 VIEW

[Series 1: leg · 0.14mm/px · 3 of 3 slices shown]
[im 1/3]
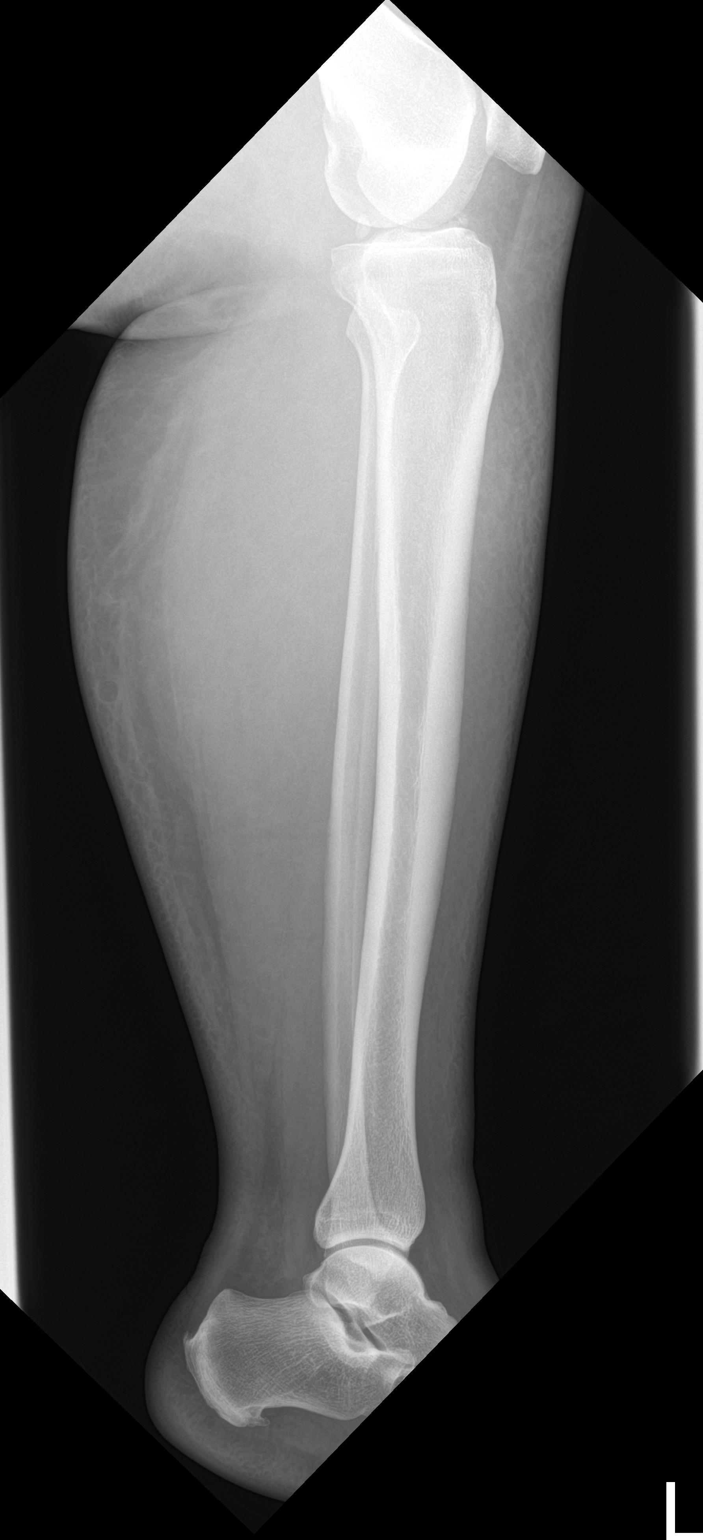
[im 2/3]
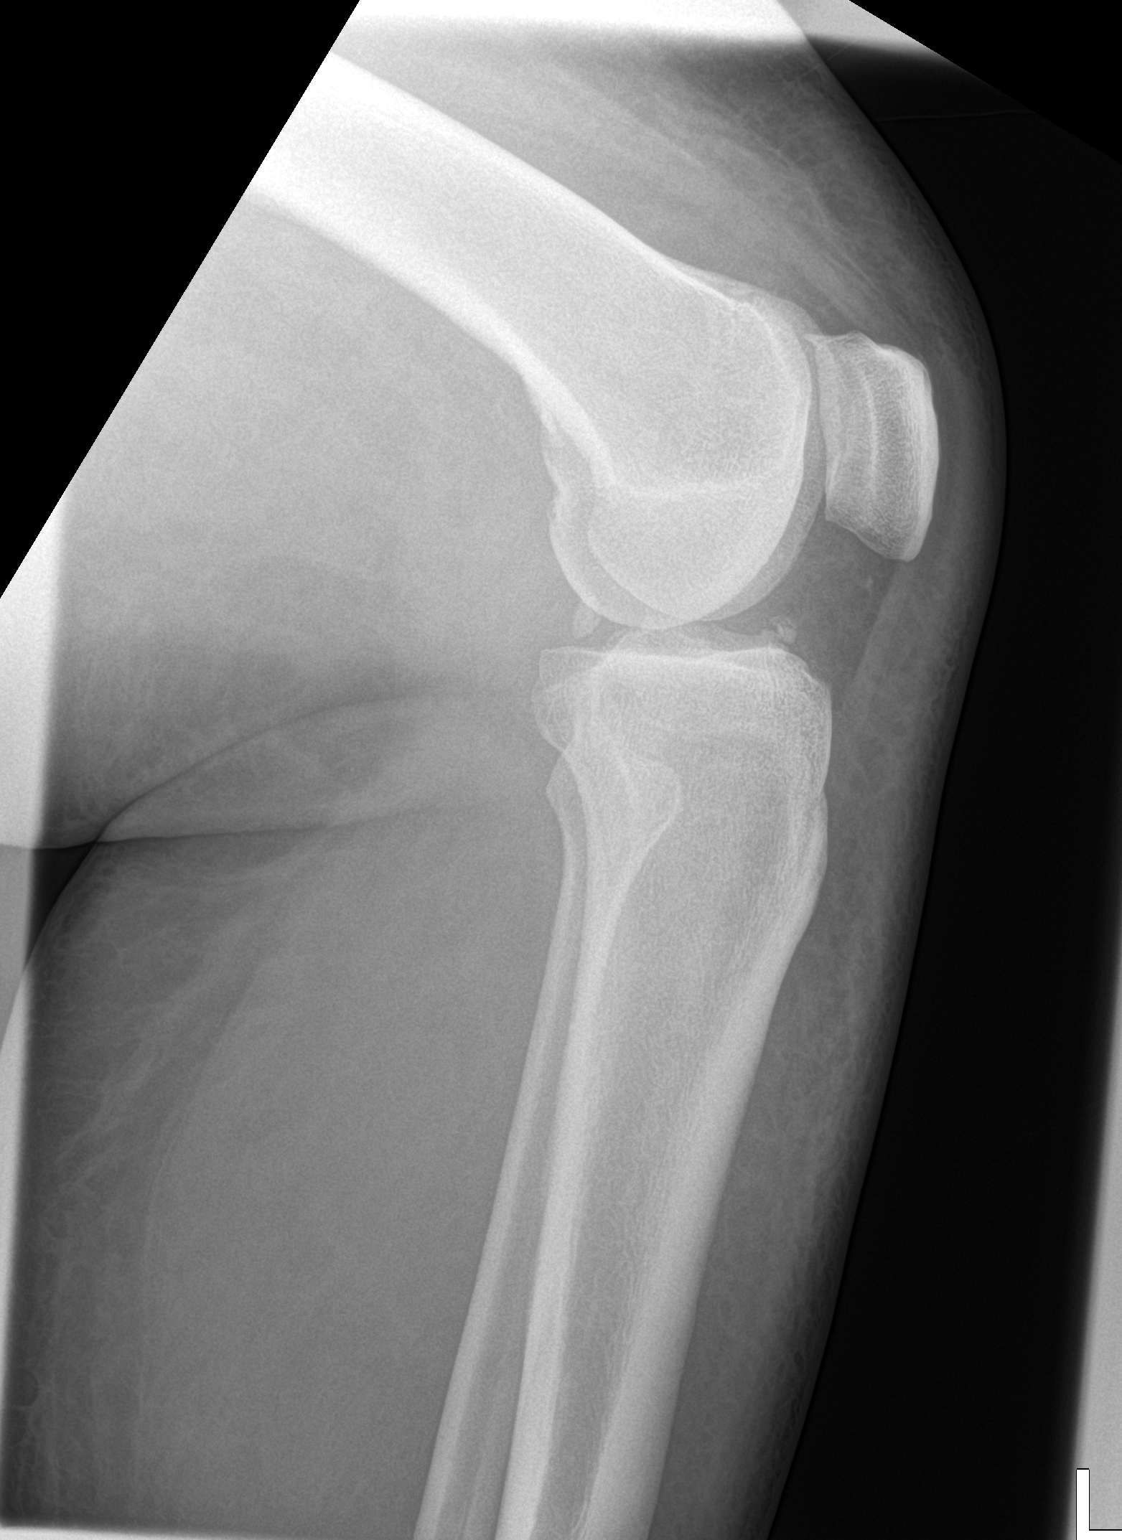
[im 3/3]
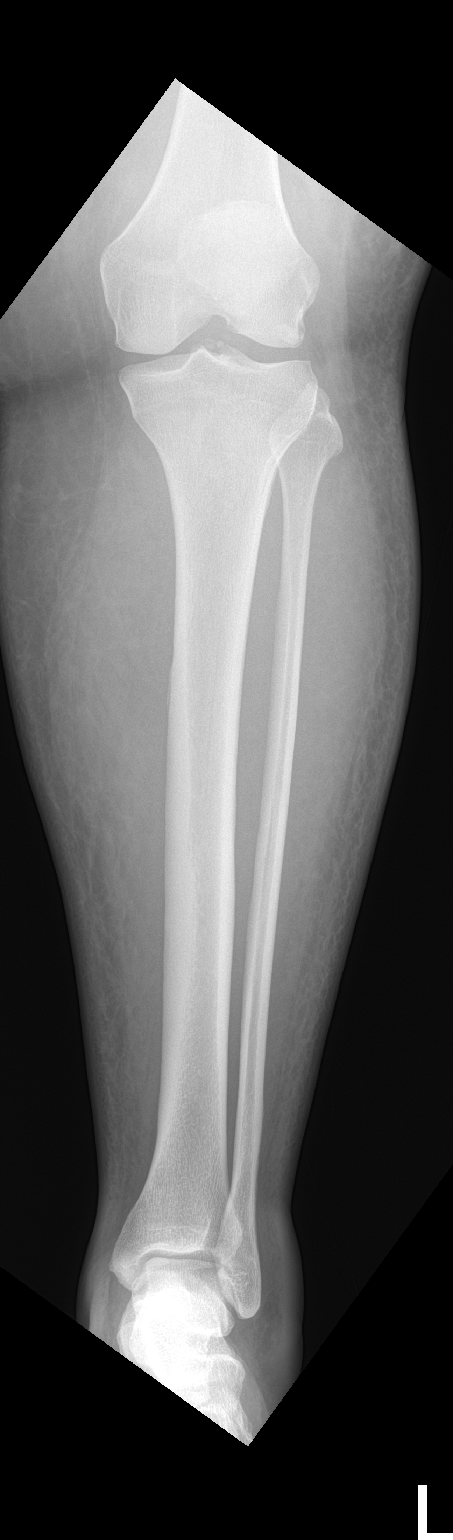

[3 of 3 positions shown; findings below may reference images not displayed]

FINDINGS: There is nonspecific soft tissue swelling about the lower extremity.
There is no acute displaced fracture. No dislocation. No radiopaque
foreign body.
IMPRESSION: Nonspecific soft tissue swelling about the lower extremity. No acute
displaced fracture or dislocation.

## 2021-02-28 MED ORDER — DIPHENHYDRAMINE HCL 50 MG/ML IJ SOLN
25.0000 mg | Freq: Once | INTRAMUSCULAR | Status: AC
  Administered 2021-02-28: 25 mg via INTRAVENOUS
  Filled 2021-02-28: qty 1

## 2021-02-28 MED ORDER — SODIUM CHLORIDE 0.9% IV SOLUTION: Freq: Once | INTRAVENOUS | Status: AC

## 2021-02-28 MED ORDER — ACETAMINOPHEN 325 MG PO TABS
650.0000 mg | ORAL_TABLET | Freq: Once | ORAL | Status: AC
  Administered 2021-02-28: 650 mg via ORAL
  Filled 2021-02-28: qty 2

## 2021-02-28 NOTE — Progress Notes (Signed)
Patient complaining of increasing pain to left leg despite pain medication regimen, and persistent swelling. Left foot pulse remains 2+, swelling 3+ pitting, skin is warm/dry, mild bruising/discoloration noted to toes of left foot. MD notified, Stat X-ray orders received for left leg/left foot.

## 2021-02-28 NOTE — Progress Notes (Signed)
[facility] PROGRESS NOTE  Renee Wilkinson [id number] DOB: July 23, 1971 DOA: 02/09/2021 PCP: Patient, No Pcp Per (Inactive)  Status: Remains inpatient appropriate because:Altered mental status, Ongoing diagnostic testing needed not appropriate for outpatient work up, Unsafe d/c plan, IV treatments appropriate due to intensity of illness or inability to take PO and Inpatient level of care appropriate due to severity of illness   Dispo: The patient is from: Home              Anticipated d/c is to: CIR recommended but Medicaid representative in [place] confirmed that patient's type of Medicaid would not pay for out-of-state rehab or skilled nursing.  Transportation to [place] via ambulance also cost prohibitive and would not be covered by Medicaid.  Discharge disposition also delayed due to continued need for intermittent hemodialysis.  Likely patient will remain here until she is recovered enough to discharge home independently with a minimum of home health likely provided by charity.              Patient currently is not medically stable to d/c.   Difficult to place patient Yes  Level of care: Progressive  Code Status: Full Family Communication:  DVT prophylaxis: SQ Heparin Vaccination status: Unknown  Foley catheter: no  HPI:50 y.o. female with history of schizophrenia, depression/anxiety substance abuse-who was found down beside her husband (found dead by EMS)-upon presentation to the ED she was found to have acute toxic encephalopathy due to drug overdose-along with AKI, hyperkalemia, shock liver, CT imaging positive for renal infarcts-patient was intubated and admitted to the ICU.  She was started on CRRT.  Upon further stability-she was transferred to the [facility] service on 3/25.  Significant events: 3/17>> admit to ICU-intubated-started CRRT 3/25>> transferred to [facility]  Significant studies: 3/17>> UDS: Negative 3/17>> CT head: No acute abnormalities. 3/17>> CT angio  chest/abdomen: No aorticdissection,b/l renal infarcts 3/18>> RUQ ultrasound: Cholelithiasis 3/19>> Echo: EF 60-65% 3/21>> RUQ ultrasound: Cholelithiasis and sludge-no evidence of acute cholecystitis. 3/21>> MRI C-spine: Myositis-right posterior paraspinous muscles without fluid collection. 3/21>> MRI T-spine: Myositis involving posterior paraspinous muscle 3/21>> MRI L-spine: Right posterior muscle edema throughout lumbar spine 3/23>> renal ultrasound: No hydronephrosis 3/20>> CT C-spine: No bone/disc space infection 3/20>> CT LS spine: No spinal infection 3/20>> CT abdomen/pelvis: High-grade SBO transition point in the ventral abdominal wall hernia. 3/22>> CT abdomen/pelvis: Persistent dilated small bowel loops-consistent with partial SBO. 3/22>> HIDA scan: Delayed gallbladder filling-patency of cystic duct. 3/24>> left lower extremity Doppler: No DVT  Antimicrobial therapy: See below  Microbiology data: 3/17>> blood culture: No growth x5 days   Subjective: Awake and alert.  Did not mention regarding voices.  Denies suicidal ideation.  primarily reporting plantar surface discomfort of both feet worse on the left.  States was having similar symptoms prior to most recent hospitalization.  Objective: Vitals:   02/27/21 2356 02/28/21 0439  BP: 121/81 119/67  Pulse: 93 80  Resp: 20 18  Temp: 99.4 F (37.4 C) 98.5 F (36.9 C)  SpO2: 91%     Intake/Output Summary (Last 24 hours) at 02/28/2021 0853 Last data filed at 02/27/2021 1543 Gross per 24 hour  Intake --  Output 600 ml  Net -600 ml   Filed Weights   02/23/21 1155 02/26/21 0500 02/27/21 0400  Weight: 116.3 kg 116.8 kg 113.6 kg    Exam:  Constitutional: NAD, calm, comfortable Respiratory: clear to auscultation bilaterally, no wheezing, no crackles. Normal respiratory effort. No accessory muscle use.  Cardiovascular: Regular rate and rhythm, no murmurs /  rubs / gallops.  Focal left lower extremity edema from the knee  down with significant puffy pedal edema but pulse remains present.  2+ pedal pulses. No carotid bruits.  Abdomen: no tenderness, no masses palpated. No hepatosplenomegaly. Bowel sounds positive.  Musculoskeletal: no clubbing / cyanosis. No joint deformity upper and lower extremities. Good ROM, no contractures. Normal muscle tone.  Skin: no rashes, lesions, ulcers. No induration Neurologic: CN 2-12 grossly intact. Sensation intact upper extremities and diminished in lower extremities especially L LE., DTR normal. Strength 4/5 x all extremities except LLE which is 3/5.  Psychiatric: Alert and oriented x 3. Normal mood.  Denies suicidal or homicidal ideations.  Denies auditory hallucinations.   Assessment/Plan: Acute problems: AKI:  -Multifactorial etiology-due to rhabdomyolysis, contrast-induced nephropathy, bilateral renal infarcts -underwent CRRT from 3/17-3/23-and then received intermittent HD at the discretion of nephrology.   -Continue to monitor closely-950 cc urinary output over the past 24 hours.  Acute toxic encephalopathy: -Due to drug overdose-mental status is much better.  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   -Neurology has signed off  Normocytic anemia: -2/2 acute illness ie AKI -no evidence of blood loss-CBC stable.  -Did receive PRBC transfusion during the earlier part of this hospital stay. -Agree 4/5 Hgb down to 6.7 with baseline around 9-10 so will transfuse 2 units PRBCs. -Repeat anemia pnl with improvement in iron from 24-36 -Continue IV iron with dialysis  treatments as per nephrology  Hyponatremia:  -Not unexpected and patient on intermittent dialysis with improving renal function  Acute Bereavement/grief/schizophrenia/depression:  -Husband died prior to admit 2/2 after smoking crack with patient.  -Evaluated by psychiatry -continue Zyprexa-remains on trazodone/Zoloft-safety sitter at bedside.  -4/4 reevaluated by psychiatry.  No longer requires inpatient psychiatric evaluation but will need intensive outpatient follow-up.  Recommendations continue one-to-one sitter for now. -4/4 Zoloft increased to 100 mg by psych; additionally recommended to not utilize Xanax for any anxiety symptoms due to prior history of polysubstance abuse but instead recommended use of Vistaril Patient  Nutrition Status/Morbid Obesity: Nutrition Problem: Increased nutrient needs Etiology: acute illness (renal insufficiency requiring HD) Signs/Symptoms: estimated needs Interventions: Boost Breeze Estimated body mass index is 41.68 kg/m as calculated from the following:   Height as of this encounter: '5\' 5"'$  (1.651 m).   Weight as of this encounter: 113.6 kg.   Other problems:  Acute hypoxic respiratory failure: Intubated on admission-currently stable on room air.  Small bowel obstruction: Treated with conservative measures-has resolved.   Chronic cholecystitis: No evidence for acute cystitis-see imaging studies above.   Data Reviewed: Basic Metabolic Panel: Recent Labs  Lab 02/24/21 0116 02/25/21 0634 02/26/21 0226 02/27/21 0103 02/28/21 0155  NA 132* 131* 133* 131* 133*  K 3.6 3.2* 3.3* 3.6 3.9  CL 95* 93* 97* 94* 95*  CO2 '28 24 27 25 28  '$ GLUCOSE 118* 97 98 100* 107*  BUN 32* 46* 54* 58* 60*  CREATININE 4.35* 5.37* 5.58* 5.50* 5.31*  CALCIUM 7.7* 8.2* 7.9* 8.0* 8.3*  PHOS 4.1 5.6* 5.0* 4.8* 4.5   Liver Function Tests: Recent Labs  Lab 02/24/21 0116 02/25/21 0634 02/26/21 0226 02/27/21  0103 02/28/21 0155  ALBUMIN 2.1* 2.2* 2.0* 2.2*  2.2*   No results for input(s): LIPASE, AMYLASE in the last 168 hours. No results for input(s): AMMONIA in the last 168 hours. CBC: Recent Labs  Lab 02/22/21 0042 02/23/21 0521 02/24/21 0116 02/25/21 0634 02/28/21 0155 02/28/21 0613  WBC 10.7* 11.6* 12.5* 10.5 10.3 8.9  NEUTROABS 4.4  --   --   --   --  3.1  HGB 6.9* 7.5* 8.0* 8.3* 7.0* 6.7*  HCT 21.4* 22.7* 23.8* 25.4* 21.4* 20.2*  MCV 90.7 89.7 89.8 88.8 91.1 90.2  PLT 262 265 293 277 240 229   Cardiac Enzymes: No results for input(s): CKTOTAL, CKMB, CKMBINDEX, TROPONINI in the last 168 hours. BNP (last 3 results) No results for input(s): BNP in the last 8760 hours.  ProBNP (last 3 results) No results for input(s): PROBNP in the last 8760 hours.  CBG: No results for input(s): GLUCAP in the last 168 hours.  No results found for this or any previous visit (from the past 240 hour(s)).   Studies: No results found.  Scheduled Meds: . sodium chloride   Intravenous Once  . sodium chloride   Intravenous Once  . sodium chloride   Intravenous Once  . acetaminophen  650 mg Oral Once  . acetaminophen  650 mg Oral Once  . Chlorhexidine Gluconate Cloth  6 each Topical Daily  . diphenhydrAMINE  25 mg Intravenous Once  . diphenhydrAMINE  25 mg Intravenous Once  . famotidine  20 mg Oral Daily  . feeding supplement  237 mL Oral TID BM  . gabapentin  100 mg Oral BID  . heparin  5,000 Units Subcutaneous Q8H  . mouth rinse  15 mL Mouth Rinse BID  . multivitamin  1 tablet Oral QHS  . OLANZapine  10 mg Oral QHS  . sertraline  100 mg Oral Daily  . sodium chloride flush  10-40 mL Intracatheter Q12H  . traZODone  150 mg Oral QHS   Continuous Infusions:  Principal Problem:   Schizoaffective disorder, depressive type ([place]) Active Problems:   Renal failure   Encounter for central line placement   Polysubstance dependence [facility])   Consultants:  PCCM  Nephrology  Neurology  Orthopedics  Psychiatry  General  surgery  Procedures: 3/17>> R IJ HD cath 3/17 ETT>> 3/17 3/30>> tunneled HD catheter placement  Antibiotics: Anti-infectives (From admission, onward)   Start     Dose/Rate Route Frequency Ordered Stop   02/22/21 1015  ceFAZolin (ANCEF) powder 2 g  Status:  Discontinued        2 g Other To Surgery 02/22/21 0922 02/22/21 0945   02/22/21 1000  ceFAZolin (ANCEF) IVPB 2g/100 mL premix        2 g 200 mL/hr over 30 Minutes Intravenous To Radiology 02/22/21 0945 02/22/21 1250   02/16/21 1000  piperacillin-tazobactam (ZOSYN) IVPB 2.25 g  Status:  Discontinued        2.25 g 100 mL/hr over 30 Minutes Intravenous Every 8 hours 02/16/21 0720 02/17/21 1047   02/13/21 1400  piperacillin-tazobactam (ZOSYN) IVPB 3.375 g  Status:  Discontinued        3.375 g 12.5 mL/hr over 240 Minutes Intravenous Every 8 hours 02/13/21 0826 02/16/21 0720   02/13/21 0815  piperacillin-tazobactam (ZOSYN) IVPB 3.375 g        3.375 g 100 mL/hr over 30 Minutes Intravenous  Once 02/13/21 0733 02/13/21 0820   02/09/21 1015  ceFEPIme (MAXIPIME) 2 g in sodium chloride 0.9 %  100 mL IVPB        2 g 200 mL/hr over 30 Minutes Intravenous  Once 02/09/21 1010 02/09/21 1112   02/09/21 1015  vancomycin (VANCOCIN) IVPB 1000 mg/200 mL premix        1,000 mg 200 mL/hr over 60 Minutes Intravenous  Once 02/09/21 1010 02/09/21 1901        Time spent: 35 minutes    Erin Hearing ANP  [facility] 7 am - 330 pm/M-F for direct patient care and secure chat Please refer to Amion for contact info 19  days
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
## 2021-02-28 NOTE — TOC Progression Note (Signed)
Transition of Care [facility]) - Progression Note    Patient Details  Name: Renee Wilkinson MRN: [id number] Date of Birth: 1971/04/16  Transition of Care [facility]) CM/SW [place], RN Phone Number: 02/28/2021, 1:09 PM  Clinical Narrative:    Case management spoke with Lelon Frohlich, CM at [place] with [facility] office in [place], [place].  Ann, CM is willing to assist with transitions of care to SNF facility in [place].  The patient's [facility] does not coverage CIR in [place] nor SNF placement in [place].  [place] Medicare will cover SNF placement in [place] once a facility if found that will meet the patient's medical needs.  The patient is not medically ready for discharge to SNF at this point, considering the patient is requiring 1:1 sitter, restraints and medical management including PRBC infusion today.  The patient is currently needing intermittent HD at [facility] and may require a SNF facility in [place] providing HD support.  The following [place] facilities in [place] provide HD support:  [place], [place], [place] [facility] - [phone]  [place], [place] - [phone]  CM and MSW with DTP Team will continue to follow the patient for SNF placement with HD support services.  Expected Discharge Plan: IP Rehab Facility Barriers to Discharge: Continued Medical Work up,Requiring sitter/restraints  Expected Discharge Plan and Services Expected Discharge Plan: [place] In-house Referral: Clinical Social Work,Chaplain (order placed for Chaplain f/u for bereavement counseling.) Discharge Planning Services: CM Consult   Living arrangements for the past 2 months: Hotel/Motel                                       Social Determinants of Health (SDOH) Interventions    Readmission Risk Interventions No flowsheet data found.

## 2021-03-01 ENCOUNTER — Inpatient Hospital Stay (HOSPITAL_COMMUNITY): Payer: Medicaid - Out of State

## 2021-03-01 HISTORY — PX: IR REMOVAL TUN CV CATH W/O FL: IMG2289

## 2021-03-01 LAB — BPAM RBC
Blood Product Expiration Date: 202204072359
Blood Product Expiration Date: 202205052359
ISSUE DATE / TIME: 202204051126
ISSUE DATE / TIME: 202204051501
Unit Type and Rh: 5100
Unit Type and Rh: 9500

## 2021-03-01 LAB — RENAL FUNCTION PANEL
Albumin: 2.3 g/dL — ABNORMAL LOW (ref 3.5–5.0)
Anion gap: 12 (ref 5–15)
BUN: 61 mg/dL — ABNORMAL HIGH (ref 6–20)
CO2: 27 mmol/L (ref 22–32)
Calcium: 8.5 mg/dL — ABNORMAL LOW (ref 8.9–10.3)
Chloride: 96 mmol/L — ABNORMAL LOW (ref 98–111)
Creatinine, Ser: 4.72 mg/dL — ABNORMAL HIGH (ref 0.44–1.00)
GFR, Estimated: 11 mL/min — ABNORMAL LOW (ref >60)
Glucose, Bld: 100 mg/dL — ABNORMAL HIGH (ref 70–99)
Phosphorus: 4.6 mg/dL (ref 2.5–4.6)
Potassium: 3.6 mmol/L (ref 3.5–5.1)
Sodium: 135 mmol/L (ref 135–145)

## 2021-03-01 LAB — CBC
HCT: 27.2 % — ABNORMAL LOW (ref 36.0–46.0)
Hemoglobin: 9.1 g/dL — ABNORMAL LOW (ref 12.0–15.0)
MCH: 29.8 pg (ref 26.0–34.0)
MCHC: 33.5 g/dL (ref 30.0–36.0)
MCV: 89.2 fL (ref 80.0–100.0)
Platelets: 195 10*3/uL (ref 150–400)
RBC: 3.05 MIL/uL — ABNORMAL LOW (ref 3.87–5.11)
RDW: 14.3 % (ref 11.5–15.5)
WBC: 8.5 10*3/uL (ref 4.0–10.5)
nRBC: 0 % (ref 0.0–0.2)

## 2021-03-01 LAB — TYPE AND SCREEN
ABO/RH(D): O POS
Antibody Screen: NEGATIVE
Unit division: 0
Unit division: 0

## 2021-03-01 LAB — CK: Total CK: 168 U/L (ref 38–234)

## 2021-03-01 LAB — C-REACTIVE PROTEIN: CRP: 1.1 mg/dL — ABNORMAL HIGH (ref <1.0)

## 2021-03-01 LAB — SEDIMENTATION RATE: Sed Rate: 80 mm/hr — ABNORMAL HIGH (ref 0–22)

## 2021-03-01 IMAGING — DX DG FOOT 2V*L*
2 series · 2 of 2 positions shown · non-contrast
Comparison: None.

CLINICAL DATA: Pain

EXAM:
LEFT FOOT - 2 VIEW

[leg]
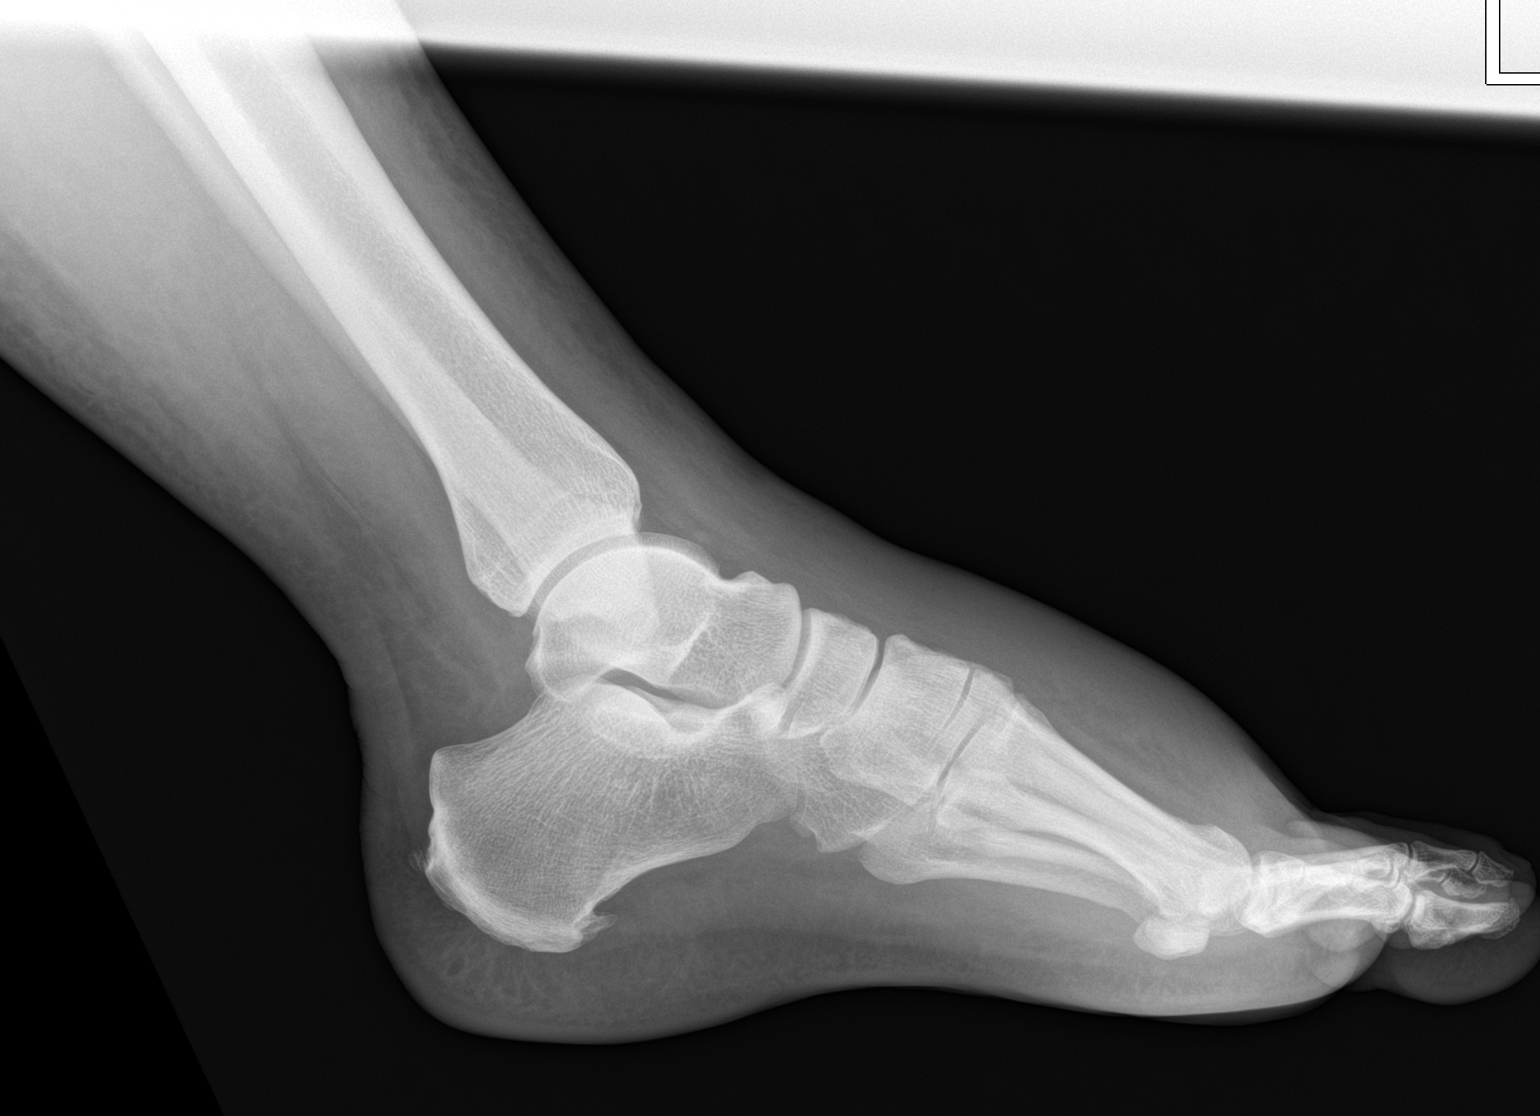

[foot]
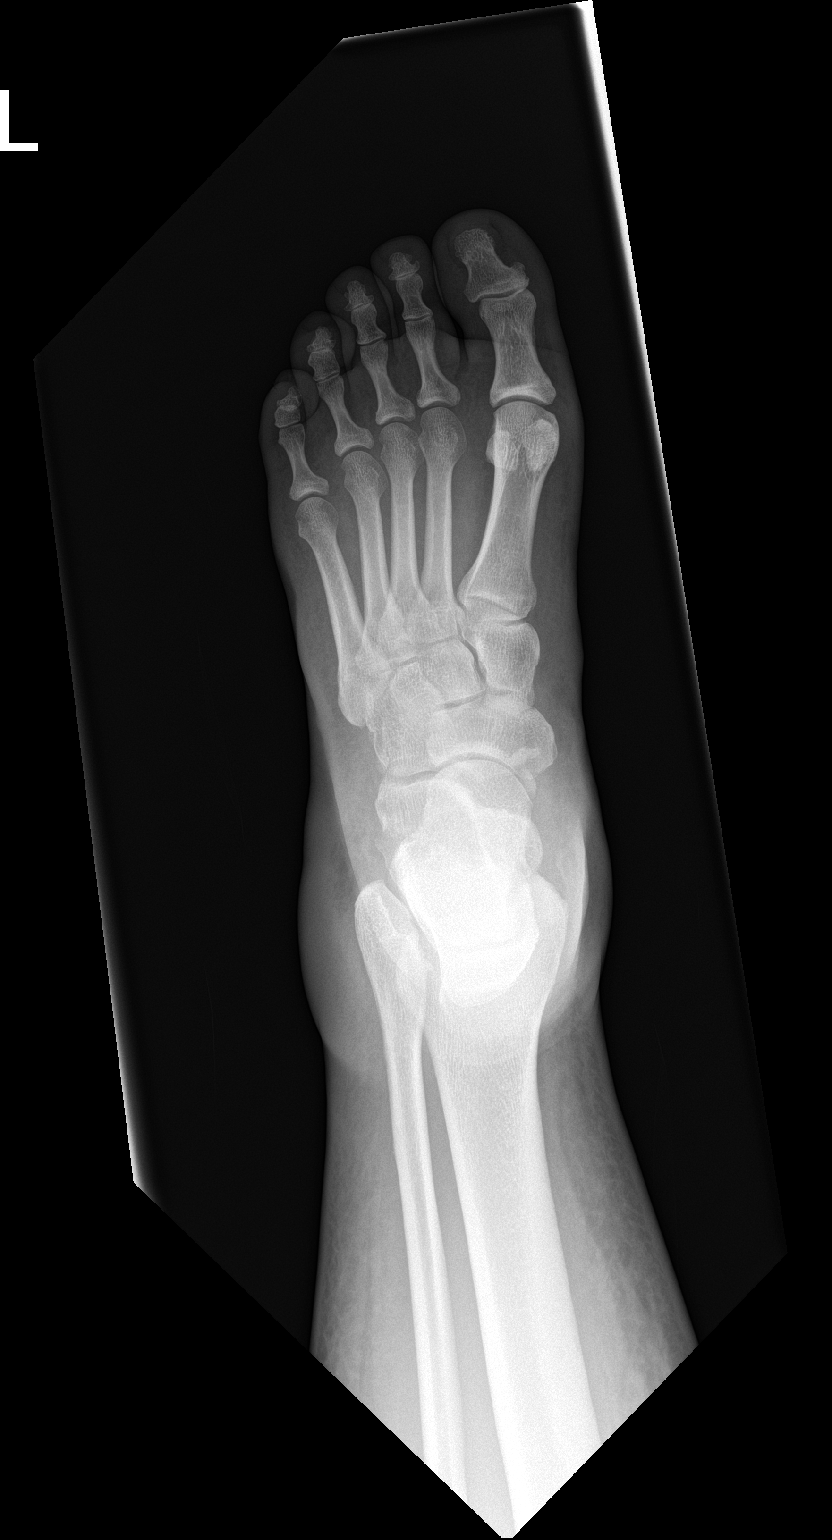

[2 of 2 positions shown; findings below may reference images not displayed]

FINDINGS: There is soft tissue swelling about the lower extremity. There is a
small plantar calcaneal spur. There is no acute displaced fracture
or dislocation. No radiopaque foreign body.
IMPRESSION: Soft tissue swelling about the lower extremity. No acute fracture or
dislocation. Small plantar calcaneal spur.

## 2021-03-01 MED ORDER — LIDOCAINE HCL 1 % IJ SOLN
INTRAMUSCULAR | Status: AC
  Filled 2021-03-01: qty 20

## 2021-03-01 MED ORDER — LIDOCAINE HCL (PF) 1 % IJ SOLN
INTRAMUSCULAR | Status: DC | PRN
  Administered 2021-03-01: 30 mL

## 2021-03-01 MED ORDER — ENSURE ENLIVE PO LIQD
237.0000 mL | Freq: Two times a day (BID) | ORAL | Status: DC
  Administered 2021-03-01 – 2021-03-07 (×11): 237 mL via ORAL

## 2021-03-01 MED ORDER — OXYCODONE HCL 5 MG PO TABS
15.0000 mg | ORAL_TABLET | ORAL | Status: DC | PRN
  Administered 2021-03-01 – 2021-03-06 (×18): 15 mg via ORAL
  Filled 2021-03-01 (×19): qty 3

## 2021-03-01 MED ORDER — HYDROMORPHONE HCL 1 MG/ML IJ SOLN
1.0000 mg | Freq: Once | INTRAMUSCULAR | Status: AC
  Administered 2021-03-01: 1 mg via INTRAVENOUS
  Filled 2021-03-01: qty 1

## 2021-03-01 NOTE — Procedures (Signed)
PROCEDURE SUMMARY:  Successful removal of right IJ tunneled HD catheter, removed intact. No immediate complications.  EBL < 2 mL. Pressure dressing (gauze + tegaderm) applied to site. Patient tolerated well.   Discharge instructions: 1- Ok to shower 48 hours post-removal. 2- No submerging (swimming, bathing) for 7 days post-removal. 3- Ok to remove bandage after first shower, no further dressing changes needed- ensure area remains clean and dry until fully healed.  Please see imaging section of Epic for full dictation.   Claris Pong Bryley Kovacevic PA-C 03/01/2021 4:55 PM
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
## 2021-03-01 NOTE — Progress Notes (Signed)
[facility] PROGRESS NOTE  Jaclynne Baldo QQP:[id number] DOB: 21-Dec-1970 DOA: 02/09/2021 PCP: Patient, No Pcp Per (Inactive)  Status: Remains inpatient appropriate because:Altered mental status, Ongoing diagnostic testing needed not appropriate for outpatient work up, Unsafe d/c plan, IV treatments appropriate due to intensity of illness or inability to take PO and Inpatient level of care appropriate due to severity of illness   Dispo: The patient is from: Home              Anticipated d/c is to: CIR recommended but Medicaid representative in [place] confirmed that patient's type of Medicaid would not pay for out-of-state rehab or skilled nursing.  Transportation to [place] via ambulance also cost prohibitive and would not be covered by Medicaid.  Discharge disposition also delayed due to continued need for intermittent hemodialysis.  Likely patient will remain here until she is recovered enough to discharge home independently with a minimum of home health likely provided by charity.  Patient will remain in the hospital to receive PT and OT services until otherwise medically stable to discharge home.  Patient plans to return to [place] and family in [place] will assist in transporting patient to [place] when medically stable for discharge.              Patient currently is not medically stable to d/c.   Difficult to place patient Yes  Level of care: Progressive  Code Status: Full Family Communication:  DVT prophylaxis: SQ Heparin Vaccination status: Unknown  Foley catheter: no  HPI:50 y.o. female with history of schizophrenia, depression/anxiety substance abuse-who was found down beside her husband (found dead by EMS)-upon presentation to the ED she was found to have acute toxic encephalopathy due to drug overdose-along with AKI, hyperkalemia, shock liver, CT imaging positive for renal infarcts-patient was intubated and admitted to the ICU.  She was started on CRRT.  Upon  further stability-she was transferred to the [facility] service on 3/25.  Significant events: 3/17>> admit to ICU-intubated-started CRRT 3/25>> transferred to [facility]  Significant studies: 3/17>> UDS: Negative 3/17>> CT head: No acute abnormalities. 3/17>> CT angio chest/abdomen: No aorticdissection,b/l renal infarcts 3/18>> RUQ ultrasound: Cholelithiasis 3/19>> Echo: EF 60-65% 3/21>> RUQ ultrasound: Cholelithiasis and sludge-no evidence of acute cholecystitis. 3/21>> MRI C-spine: Myositis-right posterior paraspinous muscles without fluid collection. 3/21>> MRI T-spine: Myositis involving posterior paraspinous muscle 3/21>> MRI L-spine: Right posterior muscle edema throughout lumbar spine 3/23>> renal ultrasound: No hydronephrosis 3/20>> CT C-spine: No bone/disc space infection 3/20>> CT LS spine: No spinal infection 3/20>> CT abdomen/pelvis: High-grade SBO transition point in the ventral abdominal wall hernia. 3/22>> CT abdomen/pelvis: Persistent dilated small bowel loops-consistent with partial SBO. 3/22>> HIDA scan: Delayed gallbladder filling-patency of cystic duct. 3/24>> left lower extremity Doppler: No DVT  Antimicrobial therapy: See below  Microbiology data: 3/17>> blood culture: No growth x5 days   Subjective: Awake.  Developed significant left lower extremity pain overnight.  Worst of the pain is from mid calf down to the toes.  Slightly increased edema.  No other symptoms reported by patient.  Objective: Vitals:   03/01/21 0356 03/01/21 0744  BP: 140/77 (!) 143/80  Pulse: 86 80  Resp: 17 15  Temp: 98 F (36.7 C) 98.4 F (36.9 C)  SpO2: 95% 95%    Intake/Output Summary (Last 24 hours) at 03/01/2021 0810 Last data filed at 03/01/2021 0358 Gross per 24 hour  Intake 1640 ml  Output 1401 ml  Net 239 ml   Filed Weights   02/23/21 1155 02/26/21 0500 02/27/21  0400  Weight: 116.3 kg 116.8 kg 113.6 kg    Exam:  Constitutional: Awake, very uncomfortable secondary to  evolving LLE pain, otherwise pleasant Respiratory: Anterior lung sounds are clear to auscultation, she remained stable on room air Cardiovascular: S1-S2, focal LLE edema with most notable changes visible from knee to toes.  Foot appears healthier today but is warm to touch.  No JVD, pulses regular, normotensive and no tachycardia Abdomen: Soft and nontender.  Normoactive bowel sounds. LBM 4/5 Musculoskeletal:  Neurologic: CN 2-12 grossly intact. Sensation intact upper extremities and diminished in lower extremities especially L LE., DTR normal. Strength 4/5 x all extremities except LLE which is 3/5.  Psychiatric: Alert and oriented x3, became emotional when discussing the circumstances of her admission including the death of her husband with case manager at bedside.  Distress primarily related to new onset LLE pain.   Assessment/Plan: Acute problems: AKI:  -Multifactorial etiology-due to rhabdomyolysis, contrast-induced nephropathy, bilateral renal infarcts -underwent CRRT from 3/17-3/23-and then received intermittent HD at the discretion of nephrology.   -Continue to monitor closely-950 cc urinary output over the past 24 hours. -Creatinine down to 4.72 as of 4/6  Acute toxic encephalopathy: -Due to drug overdose-mental status is much better.  Shock liver:  -Improving -LFTs last checked on 3/26 with an ALT of 103 and an ALT of 86 with a normal total bilirubin  LLE edema -Has been present for several weeks and is now associated with significant pain -Plain x-rays unremarkable -No clinical signs of compartment syndrome and CK is normal -Venous duplex pending-venous duplex on 3/24 - for DVT or other abnormality -Low index of suspicion for soft tissue infection given normal white count, no focal redness or erythema, no fever, slightly elevated CRP.  ESR elevated at 80 but not unexpected in current clinical setting.  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   -Neurology has signed off -See above regarding increased pain and associated edema  Normocytic anemia: -2/2 acute illness ie AKI -no evidence of blood loss-CBC stable.  -Did receive PRBC transfusion during the earlier part of this hospital stay. -Agree 4/5 Hgb down to 6.7 with baseline around 9-10 so will transfuse 2 units PRBCs.  As of 4/6 hemoglobin 9.1. -Repeat anemia pnl with improvement in iron from 24-36 -Continue IV iron with dialysis treatments as per nephrology  Hyponatremia:  -Not unexpected and patient on intermittent dialysis with improving renal function  Acute Bereavement/grief/schizophrenia/depression:  -Husband died prior to admit 2/2 after smoking crack with patient.  -Evaluated by psychiatry -continue Zyprexa-remains on trazodone/Zoloft-safety sitter at bedside.  -4/4 reevaluated by psychiatry.  No longer requires inpatient psychiatric evaluation but will need intensive outpatient follow-up.  Recommendations continue one-to-one sitter for now. -4/4 Zoloft increased to 100 mg by psych; additionally recommended to not utilize Xanax for any anxiety symptoms due to prior history of polysubstance abuse but instead recommended use of Vistaril Patient  Nutrition Status/Morbid Obesity: Nutrition Problem: Increased nutrient needs Etiology: acute illness (renal insufficiency requiring HD) Signs/Symptoms: estimated needs Interventions: Boost Breeze Estimated body mass index is 41.68 kg/m as calculated from the following:   Height as of this encounter: _0  (1.651 m).   Weight as of this encounter: 113.6 kg.   Other problems:  Acute hypoxic respiratory failure: Intubated  on admission-currently stable on room air.  Small  bowel obstruction: Treated with conservative measures-has resolved.   Chronic cholecystitis: No evidence for acute cystitis-see imaging studies above.   Data Reviewed: Basic Metabolic Panel: Recent Labs  Lab 02/25/21 0634 02/26/21 0226 02/27/21 0103 02/28/21 0155 03/01/21 0111  NA 131* 133* 131* 133* 135  K 3.2* 3.3* 3.6 3.9 3.6  CL 93* 97* 94* 95* 96*  CO2 _0 GLUCOSE 97 98 100* 107* 100*  BUN 46* 54* 58* 60* 61*  CREATININE 5.37* 5.58* 5.50* 5.31* 4.72*  CALCIUM 8.2* 7.9* 8.0* 8.3* 8.5*  PHOS 5.6* 5.0* 4.8* 4.5 4.6   Liver Function Tests: Recent Labs  Lab 02/25/21 0634 02/26/21 0226 02/27/21 0103 02/28/21 0155 03/01/21 0111  ALBUMIN 2.2* 2.0* 2.2* 2.2* 2.3*   No results for input(s): LIPASE, AMYLASE in the last 168 hours. No results for input(s): AMMONIA in the last 168 hours. CBC: Recent Labs  Lab 02/24/21 0116 02/25/21 0634 02/28/21 0155 02/28/21 0613 02/28/21 1944 03/01/21 0111  WBC 12.5* 10.5 10.3 8.9  --  8.5  NEUTROABS  --   --   --  3.1  --   --   HGB 8.0* 8.3* 7.0* 6.7* 9.4* 9.1*  HCT 23.8* 25.4* 21.4* 20.2* 28.1* 27.2*  MCV 89.8 88.8 91.1 90.2  --  89.2  PLT 293 277 240 229  --  195   Cardiac Enzymes: No results for input(s): CKTOTAL, CKMB, CKMBINDEX, TROPONINI in the last 168 hours. BNP (last 3 results) No results for input(s): BNP in the last 8760 hours.  ProBNP (last 3 results) No results for input(s): PROBNP in the last 8760 hours.  CBG: No results for input(s): GLUCAP in the last 168 hours.  No results found for this or any previous visit (from the past 240 hour(s)).   Studies: DG Tibia/Fibula Left  Result Date: 03/01/2021 CLINICAL DATA:  Pain EXAM: LEFT TIBIA AND FIBULA - 2 VIEW COMPARISON:  None. FINDINGS: There is nonspecific soft tissue swelling about the lower extremity. There is no acute displaced fracture. No dislocation. No radiopaque foreign body. IMPRESSION: Nonspecific soft tissue swelling about the lower  extremity. No acute displaced fracture or dislocation. Electronically Signed   By: Constance Holster M.D.   On: 03/01/2021 00:24   DG Foot 2 Views Left  Result Date: 03/01/2021 CLINICAL DATA:  Pain EXAM: LEFT FOOT - 2 VIEW COMPARISON:  None. FINDINGS: There is soft tissue swelling about the lower extremity. There is a small plantar calcaneal spur. There is no acute displaced fracture or dislocation. No radiopaque foreign body. IMPRESSION: Soft tissue swelling about the lower extremity. No acute fracture or dislocation. Small plantar calcaneal spur. Electronically Signed   By: Constance Holster M.D.   On: 03/01/2021 00:24    Scheduled Meds: . sodium chloride   Intravenous Once  . acetaminophen  650 mg Oral Once  . Chlorhexidine Gluconate Cloth  6 each Topical Daily  . diphenhydrAMINE  25 mg Intravenous Once  . famotidine  20 mg Oral Daily  . feeding supplement  237 mL Oral TID BM  . gabapentin  100 mg Oral BID  . heparin  5,000 Units Subcutaneous Q8H  . mouth rinse  15 mL Mouth Rinse BID  . multivitamin  1 tablet Oral QHS  . OLANZapine  10 mg Oral QHS  . sertraline  100 mg Oral Daily  . sodium chloride flush  10-40 mL Intracatheter Q12H  . traZODone  150 mg Oral  QHS   Continuous Infusions:  Principal Problem:   Schizoaffective disorder, depressive type ([place]) Active Problems:   Renal failure   Encounter for central line placement   Polysubstance dependence [facility])   Consultants:  PCCM  Nephrology  Neurology  Orthopedics  Psychiatry  General surgery  Procedures: 3/17>> R IJ HD cath 3/17 ETT>> 3/17 3/30>> tunneled HD catheter placement  Antibiotics: Anti-infectives (From admission, onward)   Start     Dose/Rate Route Frequency Ordered Stop   02/22/21 1015  ceFAZolin (ANCEF) powder 2 g  Status:  Discontinued        2 g Other To Surgery 02/22/21 0922 02/22/21 0945   02/22/21 1000  ceFAZolin (ANCEF) IVPB 2g/100 mL premix        2 g 200 mL/hr over 30 Minutes  Intravenous To Radiology 02/22/21 0945 02/22/21 1250   02/16/21 1000  piperacillin-tazobactam (ZOSYN) IVPB 2.25 g  Status:  Discontinued        2.25 g 100 mL/hr over 30 Minutes Intravenous Every 8 hours 02/16/21 0720 02/17/21 1047   02/13/21 1400  piperacillin-tazobactam (ZOSYN) IVPB 3.375 g  Status:  Discontinued        3.375 g 12.5 mL/hr over 240 Minutes Intravenous Every 8 hours 02/13/21 0826 02/16/21 0720   02/13/21 0815  piperacillin-tazobactam (ZOSYN) IVPB 3.375 g        3.375 g 100 mL/hr over 30 Minutes Intravenous  Once 02/13/21 0733 02/13/21 0820   02/09/21 1015  ceFEPIme (MAXIPIME) 2 g in sodium chloride 0.9 % 100 mL IVPB        2 g 200 mL/hr over 30 Minutes Intravenous  Once 02/09/21 1010 02/09/21 1112   02/09/21 1015  vancomycin (VANCOCIN) IVPB 1000 mg/200 mL premix        1,000 mg 200 mL/hr over 60 Minutes Intravenous  Once 02/09/21 1010 02/09/21 1901       Time spent: 35 minutes    Erin Hearing ANP  [facility] 7 am - 330 pm/M-F for direct patient care and secure chat Please refer to Amion for contact info 20  days

## 2021-03-02 ENCOUNTER — Inpatient Hospital Stay (HOSPITAL_COMMUNITY): Payer: Medicaid - Out of State

## 2021-03-02 DIAGNOSIS — M7989 Other specified soft tissue disorders: Secondary | ICD-10-CM

## 2021-03-02 DIAGNOSIS — M79605 Pain in left leg: Secondary | ICD-10-CM | POA: Diagnosis not present

## 2021-03-02 LAB — RENAL FUNCTION PANEL
Albumin: 2.3 g/dL — ABNORMAL LOW (ref 3.5–5.0)
Anion gap: 7 (ref 5–15)
BUN: 53 mg/dL — ABNORMAL HIGH (ref 6–20)
CO2: 28 mmol/L (ref 22–32)
Calcium: 8.6 mg/dL — ABNORMAL LOW (ref 8.9–10.3)
Chloride: 101 mmol/L (ref 98–111)
Creatinine, Ser: 4.02 mg/dL — ABNORMAL HIGH (ref 0.44–1.00)
GFR, Estimated: 13 mL/min — ABNORMAL LOW (ref >60)
Glucose, Bld: 89 mg/dL (ref 70–99)
Phosphorus: 4.7 mg/dL — ABNORMAL HIGH (ref 2.5–4.6)
Potassium: 3.7 mmol/L (ref 3.5–5.1)
Sodium: 136 mmol/L (ref 135–145)

## 2021-03-02 MED ORDER — PREGABALIN 75 MG PO CAPS
75.0000 mg | ORAL_CAPSULE | Freq: Every day | ORAL | Status: DC
  Administered 2021-03-02 – 2021-03-06 (×5): 75 mg via ORAL
  Filled 2021-03-02 (×5): qty 1

## 2021-03-02 NOTE — Progress Notes (Signed)
Physical Therapy Treatment Patient Details Name: Renee Wilkinson MRN: [id number] DOB: 02/18/1971 Today's Date: 03/02/2021    History of Present Illness Pt is a 50 yo female, admitted to [facility] on 3/17 after being found down (next to deceased spouse) after smoking crack cocaine and now with AKI, hyperkalemia, rhabdomyolysis, and leukocytosis.  Pt with hx of substance abuse.    PT Comments    As always pt agreeable to work with therapy. Focus of session, pain control with use of breathing and relaxation techniques. Pt has increased pain with L LE dependent position and weightbearing. Pt also has a tendency to ambulate past a safe level of pain to a point where she has to immediately sit whether it is safe or not. PT provided cuing to stop ambulation and sit before pain reaches heightened level. Once seated therapist led pt in deep breathing techniques and relaxation. Pt reported increased pain but not as much as previously. Will trial Rollator with seated rest breaks and relaxation techniques in next session.     Follow Up Recommendations  CIR     Equipment Recommendations  None recommended by PT       Precautions / Restrictions Precautions Precautions: Fall Precaution Comments: L LE swelling/pain Restrictions Weight Bearing Restrictions: No    Mobility  Bed Mobility Overal bed mobility: Needs Assistance Bed Mobility: Supine to Sit     Supine to sit: Min guard     General bed mobility comments: increased time and effort for moving L LE    Transfers Overall transfer level: Needs assistance Equipment used: Rolling walker (2 wheeled) Transfers: Sit to/from [facility] Sit to Stand: Min guard         General transfer comment: min guard for sit to stand, increased time and effort required, vc for hand placement and upright posture  Ambulation/Gait Ambulation/Gait assistance: Min assist Gait Distance (Feet): 15 Feet Assistive device: Rolling walker (2  wheeled) Gait Pattern/deviations: Step-to pattern;Decreased step length - right;Decreased step length - left;Decreased stance time - left;Antalgic Gait velocity: slowed Gait velocity interpretation: <1.31 ft/sec, indicative of household ambulator General Gait Details: light min A for safety, and close chair follow. limited distance to not advance with increased pain because pt becomes unsteady if she does not sit with increased pain.       Balance Overall balance assessment: Needs assistance Sitting-balance support: Feet supported;No upper extremity supported;Bilateral upper extremity supported;Single extremity supported Sitting balance-Leahy Scale: Fair     Standing balance support: Bilateral upper extremity supported;During functional activity;Single extremity supported Standing balance-Leahy Scale: Poor Standing balance comment: requires at least single UE                            Cognition Arousal/Alertness: Awake/alert Behavior During Therapy: WFL for tasks assessed/performed;Anxious Overall Cognitive Status: No family/caregiver present to determine baseline cognitive functioning Area of Impairment: Awareness;Problem solving;Attention                   Current Attention Level: Selective;Alternating     Safety/Judgement: Decreased awareness of safety;Decreased awareness of deficits Awareness: Emergent Problem Solving: Difficulty sequencing;Requires tactile cues;Requires verbal cues General Comments: writing notes to assist her      Exercises Other Exercises Other Exercises: breathing exercises for relaxation and decreased pain    General Comments General comments (skin integrity, edema, etc.): VSS on RA      Pertinent Vitals/Pain Pain Assessment: Faces Faces Pain Scale: Hurts even more Pain Location: L  LE with dependent position and weightbearing Pain Descriptors / Indicators: Guarding;Sore;Spasm;Numbness Pain Intervention(s): Limited activity  within patient's tolerance;Monitored during session;Repositioned;Relaxation;Utilized relaxation techniques;Other (comment) (breathing techniques,)           PT Goals (current goals can now be found in the care plan section) Acute Rehab PT Goals Patient Stated Goal: pain control, feel better PT Goal Formulation: With patient Time For Goal Achievement: 02/26/21 Potential to Achieve Goals: Good Progress towards PT goals: Progressing toward goals    Frequency    Min 3X/week      PT Plan Current plan remains appropriate       AM-PAC PT "6 Clicks" Mobility   Outcome Measure  Help needed turning from your back to your side while in a flat bed without using bedrails?: A Lot Help needed moving from lying on your back to sitting on the side of a flat bed without using bedrails?: A Lot Help needed moving to and from a bed to a chair (including a wheelchair)?: A Lot Help needed standing up from a chair using your arms (e.g., wheelchair or bedside chair)?: A Lot Help needed to walk in hospital room?: A Lot Help needed climbing 3-5 steps with a railing? : Total 6 Click Score: 11    End of Session Equipment Utilized During Treatment: Gait belt Activity Tolerance: Patient limited by pain Patient left: with call bell/phone within reach;in chair;with chair alarm set;with nursing/sitter in room Nurse Communication: Mobility status PT Visit Diagnosis: Muscle weakness (generalized) (M62.81);Pain;History of falling (Z91.81);Difficulty in walking, not elsewhere classified (R26.2) Pain - Right/Left: Left Pain - part of body: Leg     Time: 1201-1222 PT Time Calculation (min) (ACUTE ONLY): 21 min  Charges:  $Therapeutic Exercise: 8-22 mins                     Konnor Vondrasek B. Migdalia Dk PT, DPT Acute Rehabilitation Services Pager [phone] Office [phone]    [place] 03/02/2021, 4:36 PM
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
## 2021-03-02 NOTE — Progress Notes (Signed)
Left lower extremity venous duplex completed. Refer to "CV Proc" under chart review to view preliminary results.  03/02/2021 4:41 PM Kelby Aline., MHA, RVT, RDCS, RDMS

## 2021-03-03 LAB — RENAL FUNCTION PANEL
Albumin: 2.4 g/dL — ABNORMAL LOW (ref 3.5–5.0)
Anion gap: 9 (ref 5–15)
BUN: 47 mg/dL — ABNORMAL HIGH (ref 6–20)
CO2: 26 mmol/L (ref 22–32)
Calcium: 8.6 mg/dL — ABNORMAL LOW (ref 8.9–10.3)
Chloride: 103 mmol/L (ref 98–111)
Creatinine, Ser: 3.54 mg/dL — ABNORMAL HIGH (ref 0.44–1.00)
GFR, Estimated: 15 mL/min — ABNORMAL LOW (ref >60)
Glucose, Bld: 97 mg/dL (ref 70–99)
Phosphorus: 4.5 mg/dL (ref 2.5–4.6)
Potassium: 3.7 mmol/L (ref 3.5–5.1)
Sodium: 138 mmol/L (ref 135–145)

## 2021-03-03 NOTE — Progress Notes (Signed)
[facility] PROGRESS NOTE  Renee Wilkinson TGP:[id number] DOB: 1971-07-05 DOA: 02/09/2021 PCP: Patient, No Pcp Per (Inactive)  Status: Remains inpatient appropriate because:Altered mental status, Ongoing diagnostic testing needed not appropriate for outpatient work up, Unsafe d/c plan, IV treatments appropriate due to intensity of illness or inability to take PO and Inpatient level of care appropriate due to severity of illness   Dispo: The patient is from: Home              Anticipated d/c is to: Home               Patient currently is not medically stable to d/c.   Difficult to place patient Yes-patient has out-of-state Medicaid which will not cover SNF in [place].  Plan is to discharge to [place] with her mother.  Transportation at this time difficult given issues with mobility/requiring Rollator with in significant pain in left leg with activity.  Plan is to optimize mobility while here prior to considering discharge.  Given her mobility issues if family unable to transport via private vehicle to [place] then [place] train would be her best option.  Level of care: Progressive  Code Status: Full Family Communication: Patient only DVT prophylaxis: SQ Heparin Vaccination status: Unknown  Foley catheter: no  HPI: 50 y.o. female with history of schizophrenia, depression/anxiety substance abuse-who was found down beside her husband (found dead by EMS)-upon presentation to the ED she was found to have acute toxic encephalopathy due to drug overdose-along with AKI, hyperkalemia, shock liver, CT imaging positive for renal infarcts-patient was intubated and admitted to the ICU.  She was started on CRRT.  Upon further stability-she was transferred [facility] service on 3/25.  Significant events: 3/17>> admit to ICU-intubated-started CRRT 3/25>> transferred to [facility]  Significant studies: 3/17>> UDS: Negative 3/17>> CT head: No acute abnormalities. 3/17>> CT angio chest/abdomen:  No aorticdissection,b/l renal infarcts 3/18>> RUQ ultrasound: Cholelithiasis 3/19>> Echo: EF 60-65% 3/21>> RUQ ultrasound: Cholelithiasis and sludge-no evidence of acute cholecystitis. 3/21>> MRI C-spine: Myositis-right posterior paraspinous muscles without fluid collection. 3/21>> MRI T-spine: Myositis involving posterior paraspinous muscle 3/21>> MRI L-spine: Right posterior muscle edema throughout lumbar spine 3/23>> renal ultrasound: No hydronephrosis 3/20>> CT C-spine: No bone/disc space infection 3/20>> CT LS spine: No spinal infection 3/20>> CT abdomen/pelvis: High-grade SBO transition point in the ventral abdominal wall hernia. 3/22>> CT abdomen/pelvis: Persistent dilated small bowel loops-consistent with partial SBO. 3/22>> HIDA scan: Delayed gallbladder filling-patency of cystic duct. 3/24>> left lower extremity Doppler: No DVT  Antimicrobial therapy: See below  Microbiology data: 3/17>> blood culture: No growth x5 days   Subjective: Awake.  Reports that the constant talking/voices in her head are at baseline but she also reports that prior to admission she was not consistently taking medication for her schizophrenia so she is hopeful that with ongoing treatment the symptoms will improve.  States icing her left leg has helped with her swelling and discomfort and it is clear that from the ankle to part way up the mid tibial region the swelling has markedly diminished.  Continues to have a significant amount of pain with ambulation and can only weight-bear for about 5 minutes per recent OT note.  Discussed discharge planning and her plan right now is to go up to [place] to live with her mother.  She will attempt to contact her mother and sister to see if they can pick her up and drive her back to [place].  If not she will need to remain in  the hospital for optimization of pain management and mobility given she otherwise would need to transport by either [employer] bus or more  preferentially [employer] train.  Objective: Vitals:   03/03/21 0406 03/03/21 0733  BP: (!) 147/85 (!) 156/83  Pulse:  81  Resp:  19  Temp: 98.6 F (37 C) 98.4 F (36.9 C)  SpO2: 94% 96%    Intake/Output Summary (Last 24 hours) at 03/03/2021 3818 Last data filed at 03/02/2021 1400 Gross per 24 hour  Intake 480 ml  Output --  Net 480 ml   Filed Weights   02/26/21 0500 02/27/21 0400 03/02/21 0436  Weight: 116.8 kg 113.6 kg 114.8 kg    Exam:  Constitutional: Alert, calm, mild distress as evidenced by ongoing left lower extremity pain Respiratory: Clear to auscultation, room air Cardiovascular: Normal heart sounds, normotensive.  Continues to have left lower extremity swelling which has significantly improved with elevation and icing.. Abdomen: LBM 4/6, nontender nondistended with normoactive bowel sounds.  HNL Neurologic: CN 2-12 grossly intact. Sensation intact upper extremities and diminished in lower extremities especially L LE., DTR normal. Strength 4/5 x all extremities except LLE which is 3/5.  Psychiatric: Alert and oriented x3.  Continues to have voices/constant talking in her head which she states is at her baseline as prior to admission and per her report has improved.   Assessment/Plan: Acute problems: AKI:  -Multifactorial etiology-due to rhabdomyolysis, contrast-induced nephropathy, bilateral renal infarcts -underwent CRRT from 3/17-3/23-and then received intermittent HD at the discretion of nephrology.   -Great urinary output over the past 24 hours. -Creatinine down to 3.54 as of 4/8 -TDC removed in IR 4/6; 4/7 Nephrology has signed off  Acute toxic encephalopathy: -Due to drug overdose-mental status is much better.  Shock liver:  -Improving -LFTs last checked on 3/26 with an ALT of 103 and an ALT of 86 with a normal total bilirubin  LLE edema -Has been present for several weeks and is now associated with significant pain -Plain x-rays unremarkable -No  clinical signs of compartment syndrome and CK is normal -4/7 repeat venous duplex pending-venous duplex on 3/24 was negative for DVT or other abnormality -Low index of suspicion for soft tissue infection given normal white count, no focal redness or erythema, no fever, slightly elevated CRP.  ESR elevated at 80 but not unexpected in current clinical setting. 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  Have opted to discontinue low-dose Neurontin in favor of renal dose appropriate Lyrica. -slowly improving per patient -per patient she is now able to lift the left leg against mild resistance.   -Neurology has signed off -See above regarding increased pain and associated edema  Physical deconditioning -Secondary to recent life-threatening illness with associated AKI and rhabdo -Currently influenced by swelling and pain in left leg limiting mobility greatly -Given no payer source not eligible for SNF in [place] -Continue PT and OT and maximize mobility  Normocytic anemia: -2/2 acute illness ie AKI -no evidence of blood loss-CBC stable.  -Did receive PRBC transfusion during the earlier part of this hospital stay. -Agree 4/5 Hgb down to 6.7 with baseline around 9-10 so will transfuse 2 units PRBCs.  As of 4/6 hemoglobin 9.1. -Repeat anemia pnl  with improvement in iron from 24-36 -Continue IV iron with dialysis treatments as per nephrology  Hyponatremia:  -Not unexpected and patient on intermittent dialysis with improving renal function  Acute Bereavement/grief/schizophrenia/depression:  -Husband died prior to admit 2/2 after smoking crack with patient.  -Evaluated by  psychiatry -continue Zyprexa-remains on trazodone/Zoloft-safety sitter at bedside.  -4/4 reevaluated by psychiatry.  No longer requires inpatient psychiatric evaluation but will need intensive outpatient follow-up.  Recommendations continue one-to-one sitter for now. -4/4 Zoloft increased to 100 mg by psych; additionally recommended to not utilize Xanax for any anxiety symptoms due to prior history of polysubstance abuse but instead recommended use of Vistaril -4/8 Ask Psych to re evaluate need for safety sitter  Nutrition Status/Morbid Obesity: Nutrition Problem: Increased nutrient needs Etiology: acute illness (renal insufficiency requiring HD) Signs/Symptoms: estimated needs Interventions: Boost Breeze Estimated body mass index is 42.12 kg/m as calculated from the following:   Height as of this encounter: '5\' 5"'  (1.651 m).   Weight as of this encounter: 114.8 kg.   Other problems:  Acute hypoxic respiratory failure: Intubated on admission-currently stable on room air.  Small bowel obstruction: Treated with conservative measures-has resolved.   Chronic cholecystitis: No evidence for acute cystitis-see imaging studies above.   Data Reviewed: Basic Metabolic Panel: Recent Labs  Lab 02/27/21 0103 02/28/21 0155 03/01/21 0111 03/02/21 0221 03/03/21 0204  NA 131* 133* 135 136 138  K 3.6 3.9 3.6 3.7 3.7  CL 94* 95* 96* 101 103  CO2 '25 28 27 28 26  ' GLUCOSE 100* 107* 100* 89 97  BUN 58* 60* 61* 53* 47*  CREATININE 5.50* 5.31* 4.72* 4.02* 3.54*  CALCIUM 8.0* 8.3* 8.5* 8.6* 8.6*  PHOS 4.8* 4.5 4.6 4.7* 4.5   Liver Function Tests: Recent Labs  Lab 02/27/21 0103 02/28/21 0155 03/01/21 0111 03/02/21 0221 03/03/21 0204  ALBUMIN 2.2* 2.2* 2.3* 2.3* 2.4*   No results for input(s): LIPASE, AMYLASE in the last 168 hours. No results for input(s): AMMONIA in the last 168 hours. CBC: Recent Labs  Lab 02/25/21 0634 02/28/21 0155 02/28/21 0613 02/28/21 1944 03/01/21 0111   WBC 10.5 10.3 8.9  --  8.5  NEUTROABS  --   --  3.1  --   --   HGB 8.3* 7.0* 6.7* 9.4* 9.1*  HCT 25.4* 21.4* 20.2* 28.1* 27.2*  MCV 88.8 91.1 90.2  --  89.2  PLT 277 240 229  --  195   Cardiac Enzymes: Recent Labs  Lab 03/01/21 0925  CKTOTAL 168   BNP (last 3 results) No results for input(s): BNP in the last 8760 hours.  ProBNP (last 3 results) No results for input(s): PROBNP in the last 8760 hours.  CBG: No results for input(s): GLUCAP in the last 168 hours.  No results found for this or any previous visit (from the past 240 hour(s)).   Studies: IR Removal Tun Cv Cath W/O FL  Result Date: 03/01/2021 INDICATION: Patient with history of AKI s/p non tunneled HD catheter placement bedside by CCM 02/09/2021; s/p right IJ tunneled HD catheter placement in IR 02/22/2021. Patient now in renal recovery. Request is made for removal of tunneled HD catheter. EXAM: REMOVAL OF TUNNELED HEMODIALYSIS CATHETER MEDICATIONS: 8 mL 1% lidocaine COMPLICATIONS: None immediate. PROCEDURE: Informed written consent was obtained from the patient following an explanation of the procedure, risks, benefits and alternatives to treatment. A time out was performed prior to the initiation of the procedure. Maximal barrier sterile technique was utilized including mask, sterile gloves,  large sterile drape, hand hygiene, and ChloraPrep. 1% lidocaine was injected under sterile conditions along the subcutaneous tunnel. Utilizing a combination of blunt dissection and gentle traction, the catheter was removed intact. Hemostasis was obtained with manual compression. A dressing was placed. The patient tolerated the procedure well without immediate post procedural complication. IMPRESSION: Successful removal of tunneled dialysis catheter. Read by: Earley Abide, PA-C Electronically Signed   By: Corrie Mckusick D.O.   On: 03/01/2021 16:57   VAS [place] LOWER EXTREMITY VENOUS (DVT)  Result Date: 03/02/2021  Lower Venous DVT Study  Indications: Swelling, and Pain.  Comparison Study: 02/16/21- negative left lower extremity venous duplex Performing Technologist: Maudry Mayhew MHA, RDMS, RVT, RDCS  Examination Guidelines: A complete evaluation includes B-mode imaging, spectral Doppler, color Doppler, and power Doppler as needed of all accessible portions of each vessel. Bilateral testing is considered an integral part of a complete examination. Limited examinations for reoccurring indications may be performed as noted. The reflux portion of the exam is performed with the patient in reverse Trendelenburg.  +-----+---------------+---------+-----------+----------+--------------+ RIGHTCompressibilityPhasicitySpontaneityPropertiesThrombus Aging +-----+---------------+---------+-----------+----------+--------------+ CFV  Full           Yes      Yes                                 +-----+---------------+---------+-----------+----------+--------------+   +---------+---------------+---------+-----------+----------+--------------+ LEFT     CompressibilityPhasicitySpontaneityPropertiesThrombus Aging +---------+---------------+---------+-----------+----------+--------------+ CFV      Full           Yes      Yes        patent                   +---------+---------------+---------+-----------+----------+--------------+ SFJ      Full                               patent                   +---------+---------------+---------+-----------+----------+--------------+ FV Prox  Full                               patent                   +---------+---------------+---------+-----------+----------+--------------+ FV Mid   Full                               patent                   +---------+---------------+---------+-----------+----------+--------------+ FV Distal                        Yes        patent                   +---------+---------------+---------+-----------+----------+--------------+ PFV       Full                               patent                   +---------+---------------+---------+-----------+----------+--------------+ POP      Full           Yes      Yes  patent                   +---------+---------------+---------+-----------+----------+--------------+ PTV      Full                               patent                   +---------+---------------+---------+-----------+----------+--------------+ PERO     Full                               patent                   +---------+---------------+---------+-----------+----------+--------------+     Summary: RIGHT: - No evidence of common femoral vein obstruction.  LEFT: - There is no evidence of deep vein thrombosis in the lower extremity.  - No cystic structure found in the popliteal fossa.  *See table(s) above for measurements and observations. Electronically signed by Servando Snare MD on 03/02/2021 at 5:12:14 PM.    Final     Scheduled Meds: . sodium chloride   Intravenous Once  . acetaminophen  650 mg Oral Once  . Chlorhexidine Gluconate Cloth  6 each Topical Daily  . diphenhydrAMINE  25 mg Intravenous Once  . famotidine  20 mg Oral Daily  . feeding supplement  237 mL Oral BID BM  . heparin  5,000 Units Subcutaneous Q8H  . mouth rinse  15 mL Mouth Rinse BID  . multivitamin  1 tablet Oral QHS  . OLANZapine  10 mg Oral QHS  . pregabalin  75 mg Oral Daily  . sertraline  100 mg Oral Daily  . sodium chloride flush  10-40 mL Intracatheter Q12H  . traZODone  150 mg Oral QHS   Continuous Infusions:  Principal Problem:   Schizoaffective disorder, depressive type ([place]) Active Problems:   Renal failure   Encounter for central line placement   Polysubstance dependence [facility])   Consultants:  PCCM  Nephrology  Neurology  Orthopedics  Psychiatry  General surgery  Procedures: 3/17>> R IJ HD cath 3/17 ETT>> 3/17 3/30>> tunneled HD catheter placement  Antibiotics: Anti-infectives (From  admission, onward)   Start     Dose/Rate Route Frequency Ordered Stop   02/22/21 1015  ceFAZolin (ANCEF) powder 2 g  Status:  Discontinued        2 g Other To Surgery 02/22/21 0922 02/22/21 0945   02/22/21 1000  ceFAZolin (ANCEF) IVPB 2g/100 mL premix        2 g 200 mL/hr over 30 Minutes Intravenous To Radiology 02/22/21 0945 02/22/21 1250   02/16/21 1000  piperacillin-tazobactam (ZOSYN) IVPB 2.25 g  Status:  Discontinued        2.25 g 100 mL/hr over 30 Minutes Intravenous Every 8 hours 02/16/21 0720 02/17/21 1047   02/13/21 1400  piperacillin-tazobactam (ZOSYN) IVPB 3.375 g  Status:  Discontinued        3.375 g 12.5 mL/hr over 240 Minutes Intravenous Every 8 hours 02/13/21 0826 02/16/21 0720   02/13/21 0815  piperacillin-tazobactam (ZOSYN) IVPB 3.375 g        3.375 g 100 mL/hr over 30 Minutes Intravenous  Once 02/13/21 0733 02/13/21 0820   02/09/21 1015  ceFEPIme (MAXIPIME) 2 g in sodium chloride 0.9 % 100 mL IVPB        2 g 200 mL/hr over 30 Minutes Intravenous  Once 02/09/21 1010 02/09/21 1112   02/09/21 1015  vancomycin (VANCOCIN) IVPB 1000 mg/200 mL premix        1,000 mg 200 mL/hr over 60 Minutes Intravenous  Once 02/09/21 1010 02/09/21 1901       Time spent: 25 minutes    Erin Hearing ANP  [facility] 7 am - 330 pm/M-F for direct patient care and secure chat Please refer to Amion for contact info 22  days

## 2021-03-03 NOTE — Progress Notes (Signed)
Physical Therapy Treatment Patient Details Name: Renee Wilkinson MRN: [id number] DOB: 03-15-1971 Today's Date: 03/03/2021    History of Present Illness Pt is a 50 yo female, admitted to [facility] on 3/17 after being found down (next to deceased spouse) after smoking crack cocaine and now with AKI, hyperkalemia, rhabdomyolysis, and leukocytosis.  Pt with hx of substance abuse.    PT Comments    Pt very pleasant and motivated to participate with therapy. Today's skilled session focused on LE exercised and gait training with rollator. Noted initial instability with rollator that improved when pt was cued to apply slight pressure to the breaks. Pt did better this session with self monitoring and sitting prior to pain becoming overwhelming. Plan to continue gait training with rollator next session.    Follow Up Recommendations  CIR     Equipment Recommendations  None recommended by PT    Recommendations for Other Services OT consult     Precautions / Restrictions Precautions Precautions: Fall Precaution Comments: L LE swelling/pain    Mobility  Bed Mobility Overal bed mobility: Needs Assistance Bed Mobility: Supine to Sit     Supine to sit: Min guard     General bed mobility comments: min guard for safety. No assist required    Transfers Overall transfer level: Needs assistance Equipment used: 4-wheeled walker Transfers: Sit to/from [facility] Sit to Stand: Min guard         General transfer comment: close min guard for safety. VC for safe hand placement with Rollator.  Ambulation/Gait Ambulation/Gait assistance: Min guard Gait Distance (Feet): 15 Feet (20x1 10x2) Assistive device: 4-wheeled walker Gait Pattern/deviations: Step-to pattern;Decreased step length - right;Decreased step length - left;Decreased stance time - left;Antalgic Gait velocity: slowed Gait velocity interpretation: <1.31 ft/sec, indicative of household ambulator General Gait Details:  Close min guard for safety. Pt cued to lightly apply breaks during ambulation to give more stability/controll with rollator. Pt doing better this session with monitoring pain and sitting prior to being cued.   Stairs             Wheelchair Mobility    Modified Rankin (Stroke Patients Only)       Balance Overall balance assessment: Needs assistance Sitting-balance support: Feet supported;No upper extremity supported;Bilateral upper extremity supported;Single extremity supported Sitting balance-Leahy Scale: Good Sitting balance - Comments: heavy reliance on UEs when sitting EOB   Standing balance support: Bilateral upper extremity supported;During functional activity;Single extremity supported Standing balance-Leahy Scale: Poor Standing balance comment: requires at least single UE                            Cognition Arousal/Alertness: Awake/alert Behavior During Therapy: WFL for tasks assessed/performed;Anxious Overall Cognitive Status: No family/caregiver present to determine baseline cognitive functioning Area of Impairment: Awareness;Problem solving;Attention                   Current Attention Level: Alternating     Safety/Judgement: Decreased awareness of safety;Decreased awareness of deficits Awareness: Emergent Problem Solving: Difficulty sequencing;Requires tactile cues;Requires verbal cues General Comments: Some difficulty attending to tasks and pt reports memory difficulties (is taking notes as memory aid).      Exercises Total Joint Exercises Ankle Circles/Pumps: AROM;Both;10 reps;Supine Heel Slides: AROM;Both;10 reps;Supine Hip ABduction/ADduction: AROM;Both;10 reps;Supine Long Arc Quad: AROM;Both;10 reps;Seated Marching in Standing: AROM;Both;10 reps;Seated (seated marching)    General Comments        Pertinent Vitals/Pain Pain Assessment: Faces Faces Pain  Scale: Hurts even more Pain Location: L LE with dependent position and  weightbearing Pain Descriptors / Indicators: Guarding;Sore;Spasm;Numbness Pain Intervention(s): Monitored during session;Limited activity within patient's tolerance;Repositioned    Home Living                      Prior Function            PT Goals (current goals can now be found in the care plan section) Acute Rehab PT Goals Patient Stated Goal: pain control, feel better PT Goal Formulation: With patient Time For Goal Achievement: 02/26/21 Potential to Achieve Goals: Good Progress towards PT goals: Progressing toward goals    Frequency    Min 3X/week      PT Plan Current plan remains appropriate    Co-evaluation              AM-PAC PT "6 Clicks" Mobility   Outcome Measure  Help needed turning from your back to your side while in a flat bed without using bedrails?: A Little Help needed moving from lying on your back to sitting on the side of a flat bed without using bedrails?: A Little Help needed moving to and from a bed to a chair (including a wheelchair)?: A Little Help needed standing up from a chair using your arms (e.g., wheelchair or bedside chair)?: A Little Help needed to walk in hospital room?: A Little Help needed climbing 3-5 steps with a railing? : Total 6 Click Score: 16    End of Session Equipment Utilized During Treatment: Gait belt Activity Tolerance: Patient tolerated treatment well;Patient limited by pain Patient left: with call bell/phone within reach;in chair;with chair alarm set;with nursing/sitter in room Nurse Communication: Mobility status PT Visit Diagnosis: Muscle weakness (generalized) (M62.81);Pain;History of falling (Z91.81);Difficulty in walking, not elsewhere classified (R26.2) Pain - Right/Left: Left Pain - part of body: Leg     Time: [id number] PT Time Calculation (min) (ACUTE ONLY): 40 min  Charges:  $Gait Training: 8-22 mins $Therapeutic Exercise: 23-37 mins                    Benjiman Core, [place] Pager  [phone] Acute Rehab  Allena Katz 03/03/2021, 2:26 PM

## 2021-03-03 NOTE — Progress Notes (Signed)
Occupational Therapy Treatment Patient Details Name: Renee Wilkinson MRN: [id number] DOB: 03/29/1971 Today's Date: 03/03/2021    History of present illness Pt is a 50 yo female, admitted to [facility] on 3/17 after being found down (next to deceased spouse) after smoking crack cocaine and now with AKI, hyperkalemia, rhabdomyolysis, and leukocytosis.  Pt with hx of substance abuse.   OT comments  Pt making excellent progress towards OT goals and remains motivated to participate. Educated pt on various AE for LB dressing with pt able to return demo with no more than Supervision. Pt reports a reacher may be the most helpful AE and plans to obtain at discharge. Instructed pt in UE HEP using green theraband with pt able to demonstrate independently with handout provided. Pt continues with L LE pain and unable to keep LE down in dependent position > 5 min before sharp, shooting pain begins. Swelling appears to have improved since yesterday and OT assisted in further elevating LE on blankets in chair. Discussed DC planning, barriers to safety/comfort in appropriate transportation to [place]. Plan to assess tub transfers and Rollator use with ADLs during next session.    Follow Up Recommendations  CIR;Supervision - Intermittent    Equipment Recommendations  3 in 1 bedside commode;Wheelchair (measurements OT);Wheelchair cushion (measurements OT);Other (comment) (Rollator; tub bench vs shower chair pending DC location)    Recommendations for Other Services      Precautions / Restrictions Precautions Precautions: Fall Precaution Comments: L LE swelling/pain Restrictions Weight Bearing Restrictions: No       Mobility Bed Mobility               General bed mobility comments: received exiting bathroom    Transfers                      Balance Overall balance assessment: Needs assistance Sitting-balance support: Feet supported;No upper extremity supported;Bilateral upper extremity  supported;Single extremity supported Sitting balance-Leahy Scale: Good                                     ADL either performed or assessed with clinical judgement   ADL Overall ADL's : Needs assistance/impaired     Grooming: Supervision/safety;Standing;Wash/dry hands Grooming Details (indicate cue type and reason): Able to wash hands after toileting without physical assist             Lower Body Dressing: Supervision/safety;With adaptive equipment;Sitting/lateral leans;Sit to/from stand Lower Body Dressing Details (indicate cue type and reason): Educated pt on use of sock aide, reacher (and cane) for LB dressing. Pt able to demo use of this AE without physical assist after education. pt endorses the reacher may be the most helpful tool. Pt able to don L sock with increased time seated in chair though with some difficulty, no physical assist Toilet Transfer: Supervision/safety;Ambulation;RW;Regular Toilet;Grab bars Toilet Transfer Details (indicate cue type and reason): Entering as pt exiting bathroom using RW at Supervision level Toileting- Clothing Manipulation and Hygiene: Supervision/safety;Sitting/lateral lean;Sit to/from stand         General ADL Comments: Educated on use of AE for LB ADLs with good carryover     Vision   Vision Assessment?: No apparent visual deficits   Perception     Praxis      Cognition Arousal/Alertness: Awake/alert Behavior During Therapy: WFL for tasks assessed/performed;Anxious Overall Cognitive Status: No family/caregiver present to determine baseline cognitive functioning Area of Impairment:  Awareness;Problem solving;Attention                   Current Attention Level: Alternating       Awareness: Emergent Problem Solving: Difficulty sequencing;Requires tactile cues;Requires verbal cues General Comments: Some difficulty attending to tasks and pt reports memory difficulties (is taking notes as memory aid).         Exercises Exercises: General Upper Extremity General Exercises - Upper Extremity Shoulder Flexion: Strengthening;Both;10 reps;Seated;Theraband Theraband Level (Shoulder Flexion): Level 3 (Green) Shoulder Horizontal ABduction: Strengthening;Both;10 reps;Seated;Theraband Theraband Level (Shoulder Horizontal Abduction): Level 3 (Green) Elbow Flexion: Strengthening;Both;10 reps;Seated;Theraband Theraband Level (Elbow Flexion): Level 3 (Green) Elbow Extension: Strengthening;Both;10 reps;Seated;Theraband Theraband Level (Elbow Extension): Level 3 (Green)   Shoulder Instructions       General Comments VSS on RA. Improved swelling noted compared to yesterday's session though continued limitations due to pain. Pt reports plan for staff to ace wrap L LE, collaborated on use of compression stockings as appropriate. Assisted in elevated L LE further with stack of blankets    Pertinent Vitals/ Pain       Pain Assessment: Faces Faces Pain Scale: Hurts even more Pain Location: L LE with dependent position and weightbearing Pain Descriptors / Indicators: Guarding;Sore;Spasm;Numbness Pain Intervention(s): Monitored during session;Premedicated before session;Limited activity within patient's tolerance;Repositioned  Home Living                                          Prior Functioning/Environment              Frequency  Min 2X/week        Progress Toward Goals  OT Goals(current goals can now be found in the care plan section)  Progress towards OT goals: Progressing toward goals  Acute Rehab OT Goals Patient Stated Goal: pain control, feel better OT Goal Formulation: With patient Time For Goal Achievement: 03/09/21 Potential to Achieve Goals: Good ADL Goals Pt Will Perform Grooming: with supervision;standing Pt Will Perform Lower Body Bathing: sitting/lateral leans;sit to/from stand;with adaptive equipment;with min assist Pt Will Perform Lower Body Dressing:  with min assist;with adaptive equipment;sitting/lateral leans;sit to/from stand Pt Will Transfer to Toilet: with supervision;ambulating Pt Will Perform Toileting - Clothing Manipulation and hygiene: with supervision;sitting/lateral leans;sit to/from stand Additional ADL Goal #1: Pt to verbalize at least 3 energy conservation strategies to implement during ADLs, IADls and mobility  Plan Discharge plan remains appropriate    Co-evaluation                 AM-PAC OT "6 Clicks" Daily Activity     Outcome Measure   Help from another person eating meals?: None Help from another person taking care of personal grooming?: A Little Help from another person toileting, which includes using toliet, bedpan, or urinal?: A Little Help from another person bathing (including washing, rinsing, drying)?: A Little Help from another person to put on and taking off regular upper body clothing?: None Help from another person to put on and taking off regular lower body clothing?: A Little 6 Click Score: 20    End of Session Equipment Utilized During Treatment: Rolling walker  OT Visit Diagnosis: Unsteadiness on feet (R26.81);Other abnormalities of gait and mobility (R26.89);Muscle weakness (generalized) (M62.81);Pain Pain - Right/Left: Left Pain - part of body: Leg   Activity Tolerance Patient tolerated treatment well;Patient limited by pain   Patient Left in chair;with call bell/phone within  reach;with nursing/sitter in room   Nurse Communication Mobility status;Other (comment) (pain, swelling, DC plans)        Time: [id number] OT Time Calculation (min): 51 min  Charges: OT General Charges $OT Visit: 1 Visit OT Treatments $Self Care/Home Management : 8-22 mins $Therapeutic Activity: 8-22 mins $Therapeutic Exercise: 8-22 mins  Malachy Chamber, OTR/L Acute Rehab Services Office: [phone]   Layla Maw 03/03/2021, 10:57 AM

## 2021-03-03 NOTE — Consult Note (Signed)
  Patient is a 50 year old female who reports history of Schizophrenia, Major depression,Anxiety  and polysubstance use (opiates, cocaine, marijuana and tobacco) who was admitted to the hospital due to acute encephalopathy secondary to drug overdose.  Patient is seen and chart reviewed.  She is observed to be sitting upright in bedside chart actively participating with physical therapy. She is observed to be completing ROM activities with Lower extremities. She I does brighten upon approach and smiles briefly  Before returning back to her physical therapy session. Patient is able to pause to engage with writer, regarding psychiatric follow up. She is alert and oriented x 4, calm and cooperative, very pleasant upon approach. She is able to hold a linear conversation, and maintain composure when expressing her feelings. She expresses the inability to grieve over her dead husband, and how this has been difficult for her. She also states she work up last night and thought he was in the bed with her. As previously noted she describes these thoughts and conscious images in her head as hallucinations. Attempted to explain the difference but she is getting visibly frustrated, so we deferred the conversation in attempt to not delay her progress with PT. She endorses thoughts of "seeing bugs in the room, sometimes they are there and sometimes they are not. " At present she doesn't appear to be experiencing any hallucinations at this time. She doesn't appear to be responding to internal or external stimuli. She answers all questions appropriately at this time. She also denies suicidal ideations and homicidal ideations, and is able to contract for safety.   She is currently taking zyprexa '10mg'$  po daily and zoloft '100mg'$  po daily, denies any side effects at this time.   -Denies SI/HI/AVH. D/C safety sitter at this time.  -Continue current medications.  -Does not meet inpatient criteria. Will benefit from intensive outpatient  programming, trauma focused therapy, and grief loss therapy.

## 2021-03-03 NOTE — Progress Notes (Signed)
   03/03/21 1040  Clinical Encounter Type  Visited With Patient  Visit Type Spiritual support  Referral From Nurse  Consult/Referral To Chaplain  Spiritual Encounters  Spiritual Needs Grief support;Emotional  Stress Factors  Patient Stress Factors Loss  Chaplain responded to the consult for Renee Wilkinson.  She was very tearful and grieving her husband. We had an opportunity to talk about her husband and the family dynamics taking place.  Expressed that she did not have to apologize for grieving or crying and it is processed in many ways.  Encouraged her as she was speaking of having inner peace and getting help through her grieving.  Offered prayer,comfort, a listening ear and heart. Chaplain Denzel Etienne Morgan-Simpson [phone]

## 2021-03-04 LAB — RENAL FUNCTION PANEL
Albumin: 2.3 g/dL — ABNORMAL LOW (ref 3.5–5.0)
Anion gap: 6 (ref 5–15)
BUN: 43 mg/dL — ABNORMAL HIGH (ref 6–20)
CO2: 25 mmol/L (ref 22–32)
Calcium: 8.5 mg/dL — ABNORMAL LOW (ref 8.9–10.3)
Chloride: 107 mmol/L (ref 98–111)
Creatinine, Ser: 3.19 mg/dL — ABNORMAL HIGH (ref 0.44–1.00)
GFR, Estimated: 17 mL/min — ABNORMAL LOW (ref >60)
Glucose, Bld: 96 mg/dL (ref 70–99)
Phosphorus: 4.9 mg/dL — ABNORMAL HIGH (ref 2.5–4.6)
Potassium: 3.9 mmol/L (ref 3.5–5.1)
Sodium: 138 mmol/L (ref 135–145)

## 2021-03-04 LAB — CBC
HCT: 27.3 % — ABNORMAL LOW (ref 36.0–46.0)
Hemoglobin: 8.8 g/dL — ABNORMAL LOW (ref 12.0–15.0)
MCH: 30.1 pg (ref 26.0–34.0)
MCHC: 32.2 g/dL (ref 30.0–36.0)
MCV: 93.5 fL (ref 80.0–100.0)
Platelets: 263 10*3/uL (ref 150–400)
RBC: 2.92 MIL/uL — ABNORMAL LOW (ref 3.87–5.11)
RDW: 14.5 % (ref 11.5–15.5)
WBC: 8.4 10*3/uL (ref 4.0–10.5)
nRBC: 0 % (ref 0.0–0.2)

## 2021-03-04 NOTE — Progress Notes (Signed)
PROGRESS NOTE        PATIENT DETAILS Name: Renee Wilkinson Age: 50 y.o. Sex: female Date of Birth: July 14, 1971 Admit Date: 02/09/2021 Admitting Physician Lanier Clam, MD [id number], No Pcp Per (Inactive)  Brief Narrative: Patient is a 50 y.o. female with history of schizophrenia, depression/anxiety substance abuse-who was found down beside her husband (found dead by EMS)-upon presentation to the ED she was found to have acute toxic encephalopathy due to drug overdose-along with AKI, hyperkalemia, shock liver, CT imaging positive for renal infarcts-patient was intubated and admitted to the ICU.  She was started on CRRT.  Upon further stability-she was transferred to the [facility] service on 3/25.  Significant events: 3/17>> admit to ICU-intubated-started CRRT 3/25>> transferred to [facility] 4/6>> tunneled HD catheter removed by IR  Significant studies: 3/17>> UDS: Negative 3/17>> CT head: No acute abnormalities. 3/17>> CT angio chest/abdomen: No aorticdissection,b/l renal infarcts 3/18>> RUQ ultrasound: Cholelithiasis 3/19>> Echo: EF 60-65% 3/21>> RUQ ultrasound: Cholelithiasis and sludge-no evidence of acute cholecystitis. 3/21>> MRI C-spine: Myositis-right posterior paraspinous muscles without fluid collection. 3/21>> MRI T-spine: Myositis involving posterior paraspinous muscle 3/21>> MRI L-spine: Right posterior muscle edema throughout lumbar spine 3/20>> CT C-spine: No bone/disc space infection 3/20>> CT LS spine: No spinal infection 3/20>> CT abdomen/pelvis: High-grade SBO transition point in the ventral abdominal wall hernia. 3/22>> CT abdomen/pelvis: Persistent dilated small bowel loops-consistent with partial SBO. 3/22>> HIDA scan: Delayed gallbladder filling-patency of cystic duct. 3/23>> renal ultrasound: No hydronephrosis 3/24>> left lower extremity Doppler: No DVT 4/7>> left lower extremity Doppler: No DVT  Antimicrobial therapy: See  below  Microbiology data: 3/17>> blood culture: No growth x5 days  Procedures : 3/17>> R IJ HD cath 3/17 ETT>> 3/17 3/30>> tunneled HD catheter placement 4/6>> tunneled HD catheter removed by IR  Consults: CCM, nephrology, neurology, orthopedics, psychiatry, general surgery  DVT Prophylaxis : heparin injection 5,000 Units Start: 02/14/21 2200   Subjective: Left leg is unchanged-more strength-able to lift against mild resistance-swelling is improved.  Have asked charge RN to ensure that the left leg is placed in Ace wrap or TED hose.  Assessment/Plan: AKI: Multifactorial etiology-due to rhabdomyolysis, contrast-induced nephropathy, bilateral renal infarcts-underwent CRRT from 3/17-3/23-and then received intermittent HD at the discretion of nephrology.  Thankfully renal function has improved-HD catheter removed-nephrology has signed off as renal function continues to improve.  Continue to monitor renal function periodically.   recovery.  Acute toxic encephalopathy: Due to drug overdose-mental status is much better.  Shock liver: Resolved  Acute hypoxic respiratory failure: Intubated on admission-currently stable on room air.  Small bowel obstruction: Treated with conservative measures-has resolved.   Chronic cholecystitis: No evidence for acute cystitis-see imaging studies above.  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  During the earlier part of this hospital stay-there was some concern for compartment syndrome-orthopedics was consulted-not felt to have compartment syndrome.  Neurology has also signed off.    Normocytic anemia: Probably due to acute illness-AKI-no evidence of blood loss-CBC stable.  Did receive PRBC transfusion during the earlier part of this hospital  stay.  Hyponatremia: Mild-probably will improve post HD-recheck electrolytes tomorrow.  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    Morbid Obesity: Estimated body mass index is 40.72 kg/m as calculated from the following:   Height as of this encounter: '5\' 5"'$  (1.651 m).   Weight as of this encounter: 111 kg.     Diet: Diet Order            Diet renal with fluid restriction Fluid restriction: 1200 mL Fluid; Room service appropriate? Yes; Fluid consistency: Thin  Diet effective now                  Code Status: Full code  Family Communication: None at bedside   Disposition Plan: Status is: Inpatient  Remains inpatient appropriate because:Inpatient level of care appropriate due to severity of illness   Dispo: The patient is from: Home              Anticipated d/c is to: SNF              Patient currently is not medically stable to d/c.   Difficult to place patient No   Barriers to Discharge: Long length of stay team following-with plans to discharge probably next Wednesday.  Antimicrobial agents: Anti-infectives (From admission, onward)   Start     Dose/Rate Route Frequency Ordered Stop   02/22/21 1015  ceFAZolin (ANCEF) powder 2 g  Status:  Discontinued        2 g Other To Surgery 02/22/21 0922 02/22/21 0945   02/22/21 1000  ceFAZolin (ANCEF) IVPB 2g/100 mL premix        2 g 200 mL/hr over 30 Minutes Intravenous To Radiology 02/22/21 0945 02/22/21 1250   02/16/21 1000  piperacillin-tazobactam (ZOSYN) IVPB 2.25 g  Status:  Discontinued        2.25 g 100 mL/hr over 30 Minutes Intravenous Every 8 hours 02/16/21 0720 02/17/21 1047   02/13/21 1400  piperacillin-tazobactam (ZOSYN) IVPB 3.375 g  Status:  Discontinued        3.375 g 12.5 mL/hr over 240 Minutes Intravenous Every 8 hours 02/13/21 0826 02/16/21 0720    02/13/21 0815  piperacillin-tazobactam (ZOSYN) IVPB 3.375 g        3.375 g 100 mL/hr over 30 Minutes Intravenous  Once 02/13/21 0733 02/13/21 0820   02/09/21 1015  ceFEPIme (MAXIPIME) 2 g in sodium chloride 0.9 % 100 mL IVPB        2 g 200 mL/hr over 30 Minutes Intravenous  Once 02/09/21 1010 02/09/21 1112   02/09/21 1015  vancomycin (VANCOCIN) IVPB 1000 mg/200 mL premix        1,000 mg 200 mL/hr over 60 Minutes Intravenous  Once 02/09/21 1010 02/09/21 1901       Time spent: 15- minutes-Greater than 50% of this time was spent in counseling, explanation of diagnosis, planning of further management, and coordination of care.  MEDICATIONS: Scheduled Meds: . sodium chloride   Intravenous Once  . acetaminophen  650 mg Oral Once  . Chlorhexidine Gluconate Cloth  6 each Topical Daily  . diphenhydrAMINE  25 mg Intravenous Once  . famotidine  20 mg Oral Daily  . feeding supplement  237 mL Oral BID BM  . heparin  5,000 Units Subcutaneous Q8H  . mouth rinse  15 mL Mouth Rinse BID  . multivitamin  1 tablet Oral QHS  . OLANZapine  10 mg Oral QHS  . pregabalin  75 mg Oral Daily  . sertraline  100 mg Oral Daily  . sodium chloride flush  10-40 mL Intracatheter Q12H  .  traZODone  150 mg Oral QHS   Continuous Infusions: PRN Meds:.ALPRAZolam, opium-belladonna, docusate, haloperidol lactate, HYDROmorphone (DILAUDID) injection, lidocaine (PF), methocarbamol, ondansetron (ZOFRAN) IV, oxyCODONE, phenol, sodium chloride flush   PHYSICAL EXAM: Vital signs: Vitals:   03/03/21 2003 03/04/21 0006 03/04/21 0457 03/04/21 0755  BP: (!) 131/93 123/73  (!) 142/93  Pulse: 81   77  Resp:    18  Temp: 98.8 F (37.1 C) 98.7 F (37.1 C)  99.1 F (37.3 C)  TempSrc: Oral Oral  Oral  SpO2:    94%  Weight:   111 kg   Height:       Filed Weights   02/27/21 0400 03/02/21 0436 03/04/21 0457  Weight: 113.6 kg 114.8 kg 111 kg   Body mass index is 40.72 kg/m.   Gen Exam:Alert awake-not in any  distress HEENT:atraumatic, normocephalic Chest: B/L clear to auscultation anteriorly CVS:S1S2 regular Abdomen:soft non tender, non distended Extremities:no edema Neurology: Now able to lift left leg against mild resistance Skin: no rash  I have personally reviewed following labs and imaging studies  LABORATORY DATA: CBC: Recent Labs  Lab 02/28/21 0155 02/28/21 0613 02/28/21 1944 03/01/21 0111 03/04/21 0039  WBC 10.3 8.9  --  8.5 8.4  NEUTROABS  --  3.1  --   --   --   HGB 7.0* 6.7* 9.4* 9.1* 8.8*  HCT 21.4* 20.2* 28.1* 27.2* 27.3*  MCV 91.1 90.2  --  89.2 93.5  PLT 240 229  --  195 [id number]    Basic Metabolic Panel: Recent Labs  Lab 02/28/21 0155 03/01/21 0111 03/02/21 0221 03/03/21 0204 03/04/21 0039  NA 133* 135 136 138 138  K 3.9 3.6 3.7 3.7 3.9  CL 95* 96* 101 103 107  CO2 '28 27 28 26 25  '$ GLUCOSE 107* 100* 89 97 96  BUN 60* 61* 53* 47* 43*  CREATININE 5.31* 4.72* 4.02* 3.54* 3.19*  CALCIUM 8.3* 8.5* 8.6* 8.6* 8.5*  PHOS 4.5 4.6 4.7* 4.5 4.9*    GFR: Estimated Creatinine Clearance: 26.2 mL/min (A) (by C-G formula based on SCr of 3.19 mg/dL (H)).  Liver Function Tests: Recent Labs  Lab 02/28/21 0155 03/01/21 0111 03/02/21 0221 03/03/21 0204 03/04/21 0039  ALBUMIN 2.2* 2.3* 2.3* 2.4* 2.3*   No results for input(s): LIPASE, AMYLASE in the last 168 hours. No results for input(s): AMMONIA in the last 168 hours.  Coagulation Profile: No results for input(s): INR, PROTIME in the last 168 hours.  Cardiac Enzymes: Recent Labs  Lab 03/01/21 0925  CKTOTAL 168    BNP (last 3 results) No results for input(s): PROBNP in the last 8760 hours.  Lipid Profile: No results for input(s): CHOL, HDL, LDLCALC, TRIG, CHOLHDL, LDLDIRECT in the last 72 hours.  Thyroid Function Tests: No results for input(s): TSH, T4TOTAL, FREET4, T3FREE, THYROIDAB in the last 72 hours.  Anemia Panel: No results for input(s): VITAMINB12, FOLATE, FERRITIN, TIBC, IRON, RETICCTPCT in  the last 72 hours.  Urine analysis:    Component Value Date/Time   COLORURINE AMBER (A) 02/15/2021 1708   APPEARANCEUR TURBID (A) 02/15/2021 1708   LABSPEC 1.024 02/15/2021 1708   PHURINE 5.0 02/15/2021 1708   GLUCOSEU 50 (A) 02/15/2021 1708   HGBUR LARGE (A) 02/15/2021 1708   BILIRUBINUR NEGATIVE 02/15/2021 1708   KETONESUR 5 (A) 02/15/2021 1708   PROTEINUR 100 (A) 02/15/2021 1708   NITRITE NEGATIVE 02/15/2021 1708   LEUKOCYTESUR MODERATE (A) 02/15/2021 1708    Sepsis Labs: Lactic Acid, Venous  Component Value Date/Time   LATICACIDVEN 4.0 (HH) 02/09/2021 1108    MICROBIOLOGY: No results found for this or any previous visit (from the past 240 hour(s)).  RADIOLOGY STUDIES/RESULTS: VAS [place] LOWER EXTREMITY VENOUS (DVT)  Result Date: 03/02/2021  Lower Venous DVT Study Indications: Swelling, and Pain.  Comparison Study: 02/16/21- negative left lower extremity venous duplex Performing Technologist: Maudry Mayhew MHA, RDMS, RVT, RDCS  Examination Guidelines: A complete evaluation includes B-mode imaging, spectral Doppler, color Doppler, and power Doppler as needed of all accessible portions of each vessel. Bilateral testing is considered an integral part of a complete examination. Limited examinations for reoccurring indications may be performed as noted. The reflux portion of the exam is performed with the patient in reverse Trendelenburg.  +-----+---------------+---------+-----------+----------+--------------+ RIGHTCompressibilityPhasicitySpontaneityPropertiesThrombus Aging +-----+---------------+---------+-----------+----------+--------------+ CFV  Full           Yes      Yes                                 +-----+---------------+---------+-----------+----------+--------------+   +---------+---------------+---------+-----------+----------+--------------+ LEFT     CompressibilityPhasicitySpontaneityPropertiesThrombus Aging  +---------+---------------+---------+-----------+----------+--------------+ CFV      Full           Yes      Yes        patent                   +---------+---------------+---------+-----------+----------+--------------+ SFJ      Full                               patent                   +---------+---------------+---------+-----------+----------+--------------+ FV Prox  Full                               patent                   +---------+---------------+---------+-----------+----------+--------------+ FV Mid   Full                               patent                   +---------+---------------+---------+-----------+----------+--------------+ FV Distal                        Yes        patent                   +---------+---------------+---------+-----------+----------+--------------+ PFV      Full                               patent                   +---------+---------------+---------+-----------+----------+--------------+ POP      Full           Yes      Yes        patent                   +---------+---------------+---------+-----------+----------+--------------+ PTV      Full  patent                   +---------+---------------+---------+-----------+----------+--------------+ PERO     Full                               patent                   +---------+---------------+---------+-----------+----------+--------------+     Summary: RIGHT: - No evidence of common femoral vein obstruction.  LEFT: - There is no evidence of deep vein thrombosis in the lower extremity.  - No cystic structure found in the popliteal fossa.  *See table(s) above for measurements and observations. Electronically signed by Servando Snare MD on 03/02/2021 at 5:12:14 PM.    Final      LOS: 44 days   Oren Binet, MD  [facility]    To contact the attending provider between 7A-7P or the covering provider during after hours 7P-7A,  please log into the web site [email or web address] and access using universal [place] password for that web site. If you do not have the password, please call the hospital operator.  03/04/2021, 11:12 AM

## 2021-03-05 LAB — RENAL FUNCTION PANEL
Albumin: 2.6 g/dL — ABNORMAL LOW (ref 3.5–5.0)
Anion gap: 4 — ABNORMAL LOW (ref 5–15)
BUN: 38 mg/dL — ABNORMAL HIGH (ref 6–20)
CO2: 28 mmol/L (ref 22–32)
Calcium: 8.5 mg/dL — ABNORMAL LOW (ref 8.9–10.3)
Chloride: 106 mmol/L (ref 98–111)
Creatinine, Ser: 2.95 mg/dL — ABNORMAL HIGH (ref 0.44–1.00)
GFR, Estimated: 19 mL/min — ABNORMAL LOW (ref >60)
Glucose, Bld: 92 mg/dL (ref 70–99)
Phosphorus: 4.6 mg/dL (ref 2.5–4.6)
Potassium: 4.5 mmol/L (ref 3.5–5.1)
Sodium: 138 mmol/L (ref 135–145)

## 2021-03-05 MED ORDER — ACETAMINOPHEN 325 MG PO TABS
650.0000 mg | ORAL_TABLET | Freq: Four times a day (QID) | ORAL | Status: DC | PRN
  Administered 2021-03-05: 650 mg via ORAL

## 2021-03-05 NOTE — Progress Notes (Signed)
PROGRESS NOTE        PATIENT DETAILS Name: Renee Wilkinson Age: 50 y.o. Sex: female Date of Birth: 27-Oct-1971 Admit Date: 02/09/2021 Admitting Physician Lanier Clam, MD QP:[id number], No Pcp Per (Inactive)  Brief Narrative: Patient is a 50 y.o. female with history of schizophrenia, depression/anxiety substance abuse-who was found down beside her husband (found dead by EMS)-upon presentation to the ED she was found to have acute toxic encephalopathy due to drug overdose-along with AKI, hyperkalemia, shock liver, CT imaging positive for renal infarcts-patient was intubated and admitted to the ICU.  She was started on CRRT.  Upon further stability-she was transferred to the [facility] service on 3/25.  Significant events: 3/17>> admit to ICU-intubated-started CRRT 3/25>> transferred to [facility] 4/6>> tunneled HD catheter removed by IR  Significant studies: 3/17>> UDS: Negative 3/17>> CT head: No acute abnormalities. 3/17>> CT angio chest/abdomen: No aorticdissection,b/l renal infarcts 3/18>> RUQ ultrasound: Cholelithiasis 3/19>> Echo: EF 60-65% 3/21>> RUQ ultrasound: Cholelithiasis and sludge-no evidence of acute cholecystitis. 3/21>> MRI C-spine: Myositis-right posterior paraspinous muscles without fluid collection. 3/21>> MRI T-spine: Myositis involving posterior paraspinous muscle 3/21>> MRI L-spine: Right posterior muscle edema throughout lumbar spine 3/20>> CT C-spine: No bone/disc space infection 3/20>> CT LS spine: No spinal infection 3/20>> CT abdomen/pelvis: High-grade SBO transition point in the ventral abdominal wall hernia. 3/22>> CT abdomen/pelvis: Persistent dilated small bowel loops-consistent with partial SBO. 3/22>> HIDA scan: Delayed gallbladder filling-patency of cystic duct. 3/23>> renal ultrasound: No hydronephrosis 3/24>> left lower extremity Doppler: No DVT 4/7>> left lower extremity Doppler: No DVT  Antimicrobial therapy: See  below  Microbiology data: 3/17>> blood culture: No growth x5 days  Procedures : 3/17>> R IJ HD cath 3/17 ETT>> 3/17 3/30>> tunneled HD catheter placement 4/6>> tunneled HD catheter removed by IR  Consults: CCM, nephrology, neurology, orthopedics, psychiatry, general surgery  DVT Prophylaxis : heparin injection 5,000 Units Start: 02/14/21 2200   Subjective:  Patient in bed, appears comfortable, denies any headache, no fever, no chest pain or pressure, no shortness of breath , no abdominal pain. L.Leg remains weak with swelling.   Assessment/Plan:  AKI: Multifactorial etiology-due to rhabdomyolysis, contrast-induced nephropathy, bilateral renal infarcts-underwent CRRT from 3/17-3/23-and then received intermittent HD at the discretion of nephrology.  Thankfully renal function has improved-HD catheter removed-nephrology has signed off as renal function continues to improve.  Continue to monitor renal function periodically.Bilateral TEDs, RN notified.  Acute toxic encephalopathy: Due to drug overdose-mental status is much better.  Shock liver: Resolved  Acute hypoxic respiratory failure: Intubated on admission-currently stable on room air.  Small bowel obstruction: Treated with conservative measures-has resolved.   Chronic cholecystitis: No evidence for acute cystitis-see imaging studies above.  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  During the earlier part of this hospital stay-there was some concern for compartment syndrome-orthopedics was consulted-not felt to have compartment syndrome.  Neurology has also signed off.    Normocytic anemia: Probably due to acute illness-AKI-no evidence of blood loss-CBC stable.  Did receive PRBC transfusion during the earlier part of this hospital  stay.  Hyponatremia: Mild-probably will improve post HD-recheck electrolytes tomorrow.  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    Morbid Obesity: Estimated body mass index is 40.72 kg/m as calculated from the following:   Height as of this encounter: '5\' 5"'$  (1.651 m).   Weight as of this encounter: 111 kg.     Diet: Diet Order            Diet renal with fluid restriction Fluid restriction: 1200 mL Fluid; Room service appropriate? Yes; Fluid consistency: Thin  Diet effective now                  Code Status: Full code  Family Communication: None at bedside   Disposition Plan: Status is: Inpatient  Remains inpatient appropriate because:Inpatient level of care appropriate due to severity of illness   Dispo: The patient is from: Home              Anticipated d/c is to: SNF              Patient currently is not medically stable to d/c.   Difficult to place patient No   Barriers to Discharge: Long length of stay team following-with plans to discharge probably next Wednesday.  Antimicrobial agents: Anti-infectives (From admission, onward)   Start     Dose/Rate Route Frequency Ordered Stop   02/22/21 1015  ceFAZolin (ANCEF) powder 2 g  Status:  Discontinued        2 g Other To Surgery 02/22/21 0922 02/22/21 0945   02/22/21 1000  ceFAZolin (ANCEF) IVPB 2g/100 mL premix        2 g 200 mL/hr over 30 Minutes Intravenous To Radiology 02/22/21 0945 02/22/21 1250   02/16/21 1000  piperacillin-tazobactam (ZOSYN) IVPB 2.25 g  Status:  Discontinued        2.25 g 100 mL/hr over 30 Minutes Intravenous Every 8 hours 02/16/21 0720 02/17/21 1047   02/13/21 1400  piperacillin-tazobactam (ZOSYN) IVPB 3.375 g  Status:  Discontinued        3.375 g 12.5 mL/hr over 240 Minutes Intravenous Every 8 hours 02/13/21 0826 02/16/21 0720    02/13/21 0815  piperacillin-tazobactam (ZOSYN) IVPB 3.375 g        3.375 g 100 mL/hr over 30 Minutes Intravenous  Once 02/13/21 0733 02/13/21 0820   02/09/21 1015  ceFEPIme (MAXIPIME) 2 g in sodium chloride 0.9 % 100 mL IVPB        2 g 200 mL/hr over 30 Minutes Intravenous  Once 02/09/21 1010 02/09/21 1112   02/09/21 1015  vancomycin (VANCOCIN) IVPB 1000 mg/200 mL premix        1,000 mg 200 mL/hr over 60 Minutes Intravenous  Once 02/09/21 1010 02/09/21 1901       Time spent: 15- minutes-Greater than 50% of this time was spent in counseling, explanation of diagnosis, planning of further management, and coordination of care.  MEDICATIONS: Scheduled Meds: . sodium chloride   Intravenous Once  . acetaminophen  650 mg Oral Once  . Chlorhexidine Gluconate Cloth  6 each Topical Daily  . diphenhydrAMINE  25 mg Intravenous Once  . famotidine  20 mg Oral Daily  . feeding supplement  237 mL Oral BID BM  . heparin  5,000 Units Subcutaneous Q8H  . mouth rinse  15 mL Mouth Rinse BID  . multivitamin  1 tablet Oral QHS  . OLANZapine  10 mg Oral QHS  . pregabalin  75 mg Oral Daily  . sertraline  100 mg Oral Daily  . sodium chloride flush  10-40 mL Intracatheter Q12H  .  traZODone  150 mg Oral QHS   Continuous Infusions: PRN Meds:.ALPRAZolam, opium-belladonna, docusate, haloperidol lactate, HYDROmorphone (DILAUDID) injection, lidocaine (PF), methocarbamol, ondansetron (ZOFRAN) IV, oxyCODONE, phenol, sodium chloride flush   PHYSICAL EXAM: Vital signs: Vitals:   03/04/21 2002 03/05/21 0108 03/05/21 0406 03/05/21 0811  BP: 123/90 135/67 (!) 156/85 (!) 151/69  Pulse:  79  82  Resp: '15 16 18 18  '$ Temp: 98.3 F (36.8 C) 98 F (36.7 C) 98.3 F (36.8 C) 98.6 F (37 C)  TempSrc: Oral Oral Oral Oral  SpO2: 96% 95% 98% 98%  Weight:      Height:       Filed Weights   02/27/21 0400 03/02/21 0436 03/04/21 0457  Weight: 113.6 kg 114.8 kg 111 kg   Body mass index is 40.72 kg/m.   Gen  Exam:  Awake Alert, No new F.N deficits, L.leg still weak, 1+ edema L.Leg Samson.AT,PERRAL Supple Neck,No JVD, No cervical lymphadenopathy appriciated.  Symmetrical Chest wall movement, Good air movement bilaterally, CTAB RRR,No Gallops, Rubs or new Murmurs, No Parasternal Heave +ve B.Sounds, Abd Soft, No tenderness, No organomegaly appriciated, No rebound - guarding or rigidity. No Cyanosis    I have personally reviewed following labs and imaging studies  LABORATORY DATA: CBC: Recent Labs  Lab 02/28/21 0155 02/28/21 0613 02/28/21 1944 03/01/21 0111 03/04/21 0039  WBC 10.3 8.9  --  8.5 8.4  NEUTROABS  --  3.1  --   --   --   HGB 7.0* 6.7* 9.4* 9.1* 8.8*  HCT 21.4* 20.2* 28.1* 27.2* 27.3*  MCV 91.1 90.2  --  89.2 93.5  PLT 240 229  --  195 [id number]    Basic Metabolic Panel: Recent Labs  Lab 03/01/21 0111 03/02/21 0221 03/03/21 0204 03/04/21 0039 03/05/21 0013  NA 135 136 138 138 138  K 3.6 3.7 3.7 3.9 4.5  CL 96* 101 103 107 106  CO2 '27 28 26 25 28  '$ GLUCOSE 100* 89 97 96 92  BUN 61* 53* 47* 43* 38*  CREATININE 4.72* 4.02* 3.54* 3.19* 2.95*  CALCIUM 8.5* 8.6* 8.6* 8.5* 8.5*  PHOS 4.6 4.7* 4.5 4.9* 4.6    GFR: Estimated Creatinine Clearance: 28.3 mL/min (A) (by C-G formula based on SCr of 2.95 mg/dL (H)).  Liver Function Tests: Recent Labs  Lab 03/01/21 0111 03/02/21 0221 03/03/21 0204 03/04/21 0039 03/05/21 0013  ALBUMIN 2.3* 2.3* 2.4* 2.3* 2.6*   No results for input(s): LIPASE, AMYLASE in the last 168 hours. No results for input(s): AMMONIA in the last 168 hours.  Coagulation Profile: No results for input(s): INR, PROTIME in the last 168 hours.  Cardiac Enzymes: Recent Labs  Lab 03/01/21 0925  CKTOTAL 168    BNP (last 3 results) No results for input(s): PROBNP in the last 8760 hours.  Lipid Profile: No results for input(s): CHOL, HDL, LDLCALC, TRIG, CHOLHDL, LDLDIRECT in the last 72 hours.  Thyroid Function Tests: No results for input(s): TSH,  T4TOTAL, FREET4, T3FREE, THYROIDAB in the last 72 hours.  Anemia Panel: No results for input(s): VITAMINB12, FOLATE, FERRITIN, TIBC, IRON, RETICCTPCT in the last 72 hours.  Urine analysis:    Component Value Date/Time   COLORURINE AMBER (A) 02/15/2021 1708   APPEARANCEUR TURBID (A) 02/15/2021 1708   LABSPEC 1.024 02/15/2021 1708   PHURINE 5.0 02/15/2021 1708   GLUCOSEU 50 (A) 02/15/2021 1708   HGBUR LARGE (A) 02/15/2021 1708   BILIRUBINUR NEGATIVE 02/15/2021 1708   KETONESUR 5 (A) 02/15/2021 1708   PROTEINUR 100 (  A) 02/15/2021 1708   NITRITE NEGATIVE 02/15/2021 1708   LEUKOCYTESUR MODERATE (A) 02/15/2021 1708    Sepsis Labs: Lactic Acid, Venous    Component Value Date/Time   LATICACIDVEN 4.0 (HH) 02/09/2021 1108    MICROBIOLOGY: No results found for this or any previous visit (from the past 240 hour(s)).  RADIOLOGY STUDIES/RESULTS: No results found.   LOS: 24 days   Lala Lund, MD  [facility]  03/05/2021, 11:30 AM

## 2021-03-06 ENCOUNTER — Other Ambulatory Visit (HOSPITAL_COMMUNITY): Payer: Self-pay

## 2021-03-06 DIAGNOSIS — G629 Polyneuropathy, unspecified: Secondary | ICD-10-CM

## 2021-03-06 DIAGNOSIS — K56609 Unspecified intestinal obstruction, unspecified as to partial versus complete obstruction: Secondary | ICD-10-CM | POA: Diagnosis present

## 2021-03-06 DIAGNOSIS — F4320 Adjustment disorder, unspecified: Secondary | ICD-10-CM

## 2021-03-06 DIAGNOSIS — R5381 Other malaise: Secondary | ICD-10-CM | POA: Diagnosis not present

## 2021-03-06 DIAGNOSIS — K7200 Acute and subacute hepatic failure without coma: Secondary | ICD-10-CM | POA: Diagnosis present

## 2021-03-06 DIAGNOSIS — N179 Acute kidney failure, unspecified: Secondary | ICD-10-CM | POA: Diagnosis present

## 2021-03-06 DIAGNOSIS — Z992 Dependence on renal dialysis: Secondary | ICD-10-CM

## 2021-03-06 DIAGNOSIS — D649 Anemia, unspecified: Secondary | ICD-10-CM | POA: Diagnosis present

## 2021-03-06 DIAGNOSIS — R600 Localized edema: Secondary | ICD-10-CM | POA: Diagnosis present

## 2021-03-06 DIAGNOSIS — Z634 Disappearance and death of family member: Secondary | ICD-10-CM

## 2021-03-06 DIAGNOSIS — K811 Chronic cholecystitis: Secondary | ICD-10-CM | POA: Diagnosis present

## 2021-03-06 DIAGNOSIS — G928 Other toxic encephalopathy: Secondary | ICD-10-CM | POA: Diagnosis present

## 2021-03-06 DIAGNOSIS — M609 Myositis, unspecified: Secondary | ICD-10-CM | POA: Diagnosis present

## 2021-03-06 LAB — RENAL FUNCTION PANEL
Albumin: 2.4 g/dL — ABNORMAL LOW (ref 3.5–5.0)
Anion gap: 10 (ref 5–15)
BUN: 35 mg/dL — ABNORMAL HIGH (ref 6–20)
CO2: 22 mmol/L (ref 22–32)
Calcium: 8.4 mg/dL — ABNORMAL LOW (ref 8.9–10.3)
Chloride: 106 mmol/L (ref 98–111)
Creatinine, Ser: 2.79 mg/dL — ABNORMAL HIGH (ref 0.44–1.00)
GFR, Estimated: 20 mL/min — ABNORMAL LOW (ref >60)
Glucose, Bld: 91 mg/dL (ref 70–99)
Phosphorus: 4.7 mg/dL — ABNORMAL HIGH (ref 2.5–4.6)
Potassium: 3.6 mmol/L (ref 3.5–5.1)
Sodium: 138 mmol/L (ref 135–145)

## 2021-03-06 MED ORDER — HYDROCODONE-ACETAMINOPHEN 7.5-325 MG PO TABS: 1.0000 | ORAL_TABLET | Freq: Four times a day (QID) | ORAL | 0 refills | Status: DC | PRN

## 2021-03-06 MED ORDER — METHOCARBAMOL 750 MG PO TABS
750.0000 mg | ORAL_TABLET | Freq: Four times a day (QID) | ORAL | 1 refills | Status: AC | PRN
  Filled 2021-03-06: qty 30, 8d supply, fill #0

## 2021-03-06 MED ORDER — OLANZAPINE 10 MG PO TABS
10.0000 mg | ORAL_TABLET | Freq: Every day | ORAL | 3 refills | Status: AC
  Filled 2021-03-06: qty 30, 30d supply, fill #0

## 2021-03-06 MED ORDER — OLANZAPINE 10 MG PO TABS: 10.0000 mg | ORAL_TABLET | Freq: Every day | ORAL | 3 refills | Status: DC

## 2021-03-06 MED ORDER — ALPRAZOLAM 0.5 MG PO TABS: 0.5000 mg | ORAL_TABLET | Freq: Three times a day (TID) | ORAL | 0 refills | Status: DC | PRN

## 2021-03-06 MED ORDER — PREGABALIN 75 MG PO CAPS
75.0000 mg | ORAL_CAPSULE | Freq: Two times a day (BID) | ORAL | Status: DC
  Administered 2021-03-06 – 2021-03-07 (×2): 75 mg via ORAL
  Filled 2021-03-06 (×2): qty 1

## 2021-03-06 MED ORDER — SERTRALINE HCL 100 MG PO TABS: 100.0000 mg | ORAL_TABLET | Freq: Every day | ORAL | 3 refills | Status: DC

## 2021-03-06 MED ORDER — METHOCARBAMOL 750 MG PO TABS: 750.0000 mg | ORAL_TABLET | Freq: Four times a day (QID) | ORAL | 1 refills | Status: DC | PRN

## 2021-03-06 MED ORDER — FAMOTIDINE 20 MG PO TABS
20.0000 mg | ORAL_TABLET | Freq: Every day | ORAL | 3 refills | Status: AC
  Filled 2021-03-06: qty 30, 30d supply, fill #0

## 2021-03-06 MED ORDER — ALPRAZOLAM 0.5 MG PO TABS
0.5000 mg | ORAL_TABLET | Freq: Three times a day (TID) | ORAL | 0 refills | Status: AC | PRN
  Filled 2021-03-06: qty 20, 7d supply, fill #0

## 2021-03-06 MED ORDER — TRAZODONE HCL 150 MG PO TABS: 150.0000 mg | ORAL_TABLET | Freq: Every day | ORAL | 3 refills | Status: DC

## 2021-03-06 MED ORDER — TRAZODONE HCL 150 MG PO TABS
150.0000 mg | ORAL_TABLET | Freq: Every day | ORAL | 3 refills | Status: AC
  Filled 2021-03-06: qty 30, 30d supply, fill #0

## 2021-03-06 MED ORDER — PREGABALIN 75 MG PO CAPS
75.0000 mg | ORAL_CAPSULE | Freq: Two times a day (BID) | ORAL | 3 refills | Status: DC
Start: 2021-03-06 — End: 2021-03-06

## 2021-03-06 MED ORDER — PREGABALIN 75 MG PO CAPS
75.0000 mg | ORAL_CAPSULE | Freq: Two times a day (BID) | ORAL | 3 refills | Status: AC
  Filled 2021-03-06: qty 60, 30d supply, fill #0

## 2021-03-06 MED ORDER — HYDROCODONE-ACETAMINOPHEN 7.5-325 MG PO TABS
1.0000 | ORAL_TABLET | Freq: Four times a day (QID) | ORAL | 0 refills | Status: AC | PRN
  Filled 2021-03-06: qty 30, 4d supply, fill #0

## 2021-03-06 MED ORDER — ACETAMINOPHEN 325 MG PO TABS: 650.0000 mg | ORAL_TABLET | Freq: Once | ORAL | 0 refills | Status: AC

## 2021-03-06 MED ORDER — SERTRALINE HCL 100 MG PO TABS
100.0000 mg | ORAL_TABLET | Freq: Every day | ORAL | 3 refills | Status: AC
  Filled 2021-03-06: qty 30, 30d supply, fill #0

## 2021-03-06 MED ORDER — HYDROCODONE-ACETAMINOPHEN 7.5-325 MG PO TABS
1.0000 | ORAL_TABLET | Freq: Four times a day (QID) | ORAL | Status: DC | PRN
  Administered 2021-03-06 – 2021-03-07 (×5): 2 via ORAL
  Filled 2021-03-06 (×5): qty 2

## 2021-03-06 MED ORDER — ACETAMINOPHEN 325 MG PO TABS: 650.0000 mg | ORAL_TABLET | Freq: Once | ORAL | 0 refills | Status: DC

## 2021-03-06 MED ORDER — FAMOTIDINE 20 MG PO TABS: 20.0000 mg | ORAL_TABLET | Freq: Every day | ORAL | 3 refills | Status: DC

## 2021-03-06 NOTE — Discharge Summary (Addendum)
Physician Discharge Summary  Shenise Wolgamott PZW:[id number] DOB: [birth year] DOA: 02/09/2021  PCP: Patient, No Pcp Per (Inactive)  Admit date: 02/09/2021 Discharge date: 03/07/2021  Time spent: 45 minutes  Recommendations for Outpatient Follow-up:  1. Plan is to discharge patient home to daughter briefly with plans to travel by bus back to [place] on 4/13 to live with her mother  2. Care has been arranged through [facility] in [place].  Appointment has been scheduled for April 29 at 10:20 AM. 3. Psychiatric team during this admission recommended that the patient establish with a psychiatrist and/or psychologist after discharge to treat her underlying schizophrenia.  Patient is prescribed medications for this disorder. [facility] will provide a long walker.  Patient has declined wheelchair.   Discharge Diagnoses:  Principal Problem:   Schizoaffective disorder, depressive type ([employer]) Active Problems:   Polysubstance dependence ([place])   Acute kidney injury ([place])   Acute hemodialysis patient ([place])   Toxic metabolic encephalopathy   Shock liver   Edema of left lower extremity   Peripheral neuropathy   Myositis of left lower extremity   Physical deconditioning   Normocytic anemia   Bereavement reaction   Chronic cholecystitis   SBO (small bowel obstruction) ([place])    Discharge Condition: Stable  Diet recommendation: Regular  Filed Weights   03/02/21 0436 03/04/21 0457 03/07/21 0323  Weight: 114.8 kg 111 kg 112.6 kg    History of present illness:  50 y.o.femalewith history of schizophrenia, depression/anxiety substance abuse-who was found down beside her husband(found dead by EMS)-upon presentation to the ED she was found to have acute toxic encephalopathy due to drug overdose-along with AKI, hyperkalemia, shock liver, CT imaging positive for renal infarcts-patient was intubated and admitted to the ICU. She was started on CRRT. Upon further  stability-she was transferred to the [facility] service on 3/25.  Significant events: 3/17>> admit to ICU-intubated-started CRRT 3/25>> transferred to [facility]  Significant studies: 3/17>> UDS: Negative 3/17>> CT head: No acute abnormalities. 3/17>> CT angio chest/abdomen: No aorticdissection,b/l renal infarcts 3/18>> RUQ ultrasound: Cholelithiasis 3/19>> Echo: EF 60-65% 3/21>> RUQ ultrasound: Cholelithiasis and sludge-no evidence of acute cholecystitis. 3/21>> MRI C-spine: Myositis-right posterior paraspinous muscles without fluid collection. 3/21>> MRI T-spine: Myositis involving posterior paraspinous muscle 3/21>> MRI L-spine: Right posterior muscle edema throughout lumbar spine 3/23>> renal ultrasound: No hydronephrosis 3/20>> CT C-spine: No bone/disc space infection 3/20>> CT LS spine: No spinal infection 3/20>> CT abdomen/pelvis: High-grade SBO transition point in the ventral abdominal wall hernia. 3/22>> CT abdomen/pelvis: Persistent dilated small bowel loops-consistent with partial SBO. 3/22>> HIDA scan: Delayed gallbladder filling-patency of cystic duct. 3/24>> left lower extremity Doppler: No DVT  Hospital Course:  Acute problems: AKI:  -Multifactorial etiology-due to rhabdomyolysis, contrast-induced nephropathy, bilateral renal infarcts -underwent CRRT from 3/17-3/23-and then received intermittent HD at the discretion of nephrology.  -Great urinary output over the past 24 hours. -Creatinine down to 2.39 as of 4/12 -TDC removed in IR 4/6; 4/7 Nephrology has signed off  Acute toxic encephalopathy: -Due to drug overdose-mental status is much better.  Shock liver:  -Improving -LFTs last checked on 3/26 with an ALT of 103 and an ALT of 86 with a normal total bilirubin  LLE edema -Has been present for several weeks and is now associated with significant pain -Plain x-rays unremarkable -No clinical signs of compartment syndrome and CK is normal -4/7 repeat venous duplex  pending-venous duplex on 3/24 was negative for DVT or other abnormality -Low index of suspicion for soft tissue  infection given normal white count, no focal redness or erythema, no fever, slightly elevated CRP.  ESR elevated at 80 but not unexpected in current clinical setting. -4/8 add LLE ACE wrap along with ice has improved edema.  She is also now demonstrating improved ambulatory effort.    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  Will increase dose from 75 mg daily to twice daily given significant improvement in renal function. -slowly improving per patient -Ambulates short distances in room without rolling walker but does require walker..  -Neurology has signed off  Physical deconditioning -Secondary to recent life-threatening illness with associated AKI and rhabdo -Given no payer source not eligible for SNF in [place] PT and OT and maximize mobility-significant improvements over the past 48 hours and patient is eager to discharge home.  Declines wheelchair but agrees to charity based rolling walker.  Normocytic anemia: -2/2 acute illness ie AKI -no evidence of blood loss-CBC stable.  -Did receive PRBC transfusion during the earlier part of this hospital stay. -Agree 4/5 Hgb down to 6.7 with baseline around 9-10 so will transfuse 2 units PRBCs.  As of 4/6 hemoglobin 9.1. -Repeat anemia pnl with improvement in iron from 24-36 -Continue IV iron with dialysis treatments as per nephrology  Hyponatremia:  -Not unexpected and patient on intermittent dialysis with improving renal function  Acute Bereavement/grief/schizophrenia/depression:  -Husband died prior to admit 2/2  after smoking crack with patient.  -Evaluated by psychiatry -continue Zyprexa-remains on trazodone/Zoloft-safety sitter at bedside.  -4/4 reevaluated by psychiatry.  No longer requires inpatient psychiatric evaluation but will need intensive outpatient follow-up.  Recommendations continue one-to-one sitter for now. -4/4 Zoloft increased to 100 mg by psych; additionally recommended to not utilize Xanax for any anxiety symptoms due to prior history of polysubstance abuse but instead recommended use of Vistaril -4/8 psych discontinued safety sitter  Nutrition Status/Morbid Obesity: Nutrition Problem: Increased nutrient needs Etiology: acute illness (renal insufficiency requiring HD) Signs/Symptoms: estimated needs Interventions: Boost Breeze Estimated body mass index is 40.72 kg/m as calculated from the following:   Height as of this encounter: '5\' 5"'  (1.651 m).   Weight as of this encounter: 111 kg.   Other problems:  Acute hypoxic respiratory failure:Intubated on admission-currently stable on room air.  Small bowel obstruction:Treated with conservative measures-has resolved.   Chronic cholecystitis:No evidence for acute cystitis-see imaging studies above.   Procedures: 3/17>> R IJ HD cath 3/17 ETT>> 3/17 3/30>> tunneled HD catheter placement  Consultations:  PCCM  Nephrology  Neurology  Orthopedics  Psychiatry  General surgery   Discharge Exam: Vitals:   03/07/21 0358 03/07/21 0748  BP: (!) 145/86 (!) 142/87  Pulse: 65 75  Resp: 18 20  Temp: 98.4 F (36.9 C) 98.1 F (36.7 C)  SpO2: 93% 96%   Constitutional: Alert, calm, NAD Respiratory: Lungs are clear, room air Cardiovascular: S1-S2, improved left lower extremity edema with most of edema located in the pedal region.  No JVD, regular pulse Abdomen: LBM 4/11, soft and nontender with normal active bowel sounds.  Tolerating diet. Neurologic: CN 2-12 grossly intact. Sensation intact upper extremities  and diminished in lower extremities especially L LE., DTR normal. Strength 4/5 x all extremities except LLE which is 3/5.  Psychiatric: Alert and oriented x3.  Pleasant affect.  Normal thought patterns without delusions.  No auditory or visual hallucinations reported.  Discharge Instructions   Discharge Instructions    Call MD for:  extreme fatigue   Complete by: As directed    Call MD for:  persistant dizziness or light-headedness   Complete by: As directed    Call MD for:  persistant nausea and vomiting   Complete by: As directed    Call MD for:  redness, tenderness, or signs of infection (pain, swelling, redness, odor or green/yellow discharge around incision site)   Complete by: As directed    Call MD for:  severe uncontrolled pain   Complete by: As directed    Call MD for:  temperature >100.4   Complete by: As directed    Diet general   Complete by: As directed    Discharge instructions   Complete by: As directed    Please take all medications as prescribed.  The majority of the medications do have refills available to the transitions of care pharmacy and can be easily transferred to your local pharmacy in [place].  Medications that are controlled substances do not have refills.  Please follow-up for your initial primary care appointment as outlined.  Increase activity slowly.  Utilized Rollator for long distances.  Outpatient physical therapy was recommended.  Psychiatric team evaluated you during this hospitalization and recommends aggressive outpatient follow-up after discharge.  Continue to ice and use Ace wrap on the swollen left leg as needed to decrease swelling and discomfort.   Increase activity slowly   Complete by: As directed    No wound care   Complete by: As directed      Allergies as of 03/07/2021   No Known Allergies     Medication List    STOP taking these medications   ibuprofen 200 MG tablet Commonly known as: ADVIL     TAKE these medications    ALPRAZolam 0.5 MG tablet Commonly known as: XANAX Take 1 tablet (0.5 mg total) by mouth 3 (three) times daily as needed for anxiety.   famotidine 20 MG tablet Commonly known as: PEPCID Take 1 tablet (20 mg total) by mouth daily.   HYDROcodone-acetaminophen 7.5-325 MG tablet Commonly known as: NORCO Take 1-2 tablets by mouth every 6 (six) hours as needed for severe pain.   methocarbamol 750 MG tablet Commonly known as: ROBAXIN Take 1 tablet (750 mg total) by mouth every 6 (six) hours as needed for muscle spasms.   OLANZapine 10 MG tablet Commonly known as: ZYPREXA Take 1 tablet (10 mg total) by mouth at bedtime.   pregabalin 75 MG capsule Commonly known as: LYRICA Take 1 capsule (75 mg total) by mouth 2 (two) times daily.   sertraline 100 MG tablet Commonly known as: ZOLOFT Take 1 tablet (100 mg total) by mouth daily.   traZODone 150 MG tablet Commonly known as: DESYREL Take 1 tablet (150 mg total) by mouth at bedtime.     ASK your doctor about these medications   acetaminophen 325 MG tablet Commonly known as: TYLENOL Take 2 tablets (650 mg total) by mouth once for 1 dose. Ask about: Should I take this medication?            Durable Medical Equipment  (From admission, onward)         Start     Ordered   03/06/21 1303  For home use only DME 4 wheeled rolling walker with seat  Once  Comments: Patient needs bariatric size / Heavy duty - [employer] - patient's Medicaid is out of state in [place]  Patient needs a walker to treat with the following condition Rhabdomyolysis   Patient needs a walker to treat with the following condition Generalized weakness      03/06/21 1303   03/06/21 1043  For home use only DME Walker rolling  Once       Question Answer Comment  Walker: With [place]   Patient needs a walker to treat with the following condition Rhabdomyolysis   Patient needs a walker to treat with the following condition  Generalized weakness      03/06/21 1043         No Known Allergies  Follow-up [place], [facility] Follow up.   Why: [employer] will be delivering a bariatric rollator to the hospital room today through [facility]. Contact information: [address] [place] [address] [phone]        Please follow up.        [facility]. Go to.   Why: You are scheduled for a hospital follow up for primary care appointment on March 24, 2021 at 10:20 am. Contact information: [address] [place] [address] [phone]               The results of significant diagnostics from this hospitalization (including imaging, microbiology, ancillary and laboratory) are listed below for reference.    Significant Diagnostic Studies: CT ABDOMEN PELVIS WO CONTRAST  Result Date: 02/14/2021 CLINICAL DATA:  Evaluate for bowel obstruction. EXAM: CT ABDOMEN AND PELVIS WITHOUT CONTRAST TECHNIQUE: Multidetector CT imaging of the abdomen and pelvis was performed following the standard protocol without IV contrast. COMPARISON:  CT AP 02/12/2021 FINDINGS: Lower chest: Persistent atelectasis within both lung bases. No pleural effusion identified. Hepatobiliary: There is no focal liver abnormality. Gallstones are noted layering within the gallbladder. No signs of gallbladder wall inflammation or bile duct dilatation. Pancreas: Unremarkable. No pancreatic ductal dilatation or surrounding inflammatory changes. Spleen: Normal in size without focal abnormality. Adrenals/Urinary Tract: Normal appearance of the adrenal glands. The kidneys are unremarkable. No urinary tract calculi or hydronephrosis. The bladder is collapsed around a Foley catheter. Stomach/Bowel: There is an NG tube with tip in body of stomach. The stomach appears decompressed. Dilated small bowel loops with air-fluid levels are again identified. Small bowel loops  measure up to 4.2 cm in diameter. The appendix is visualized and appears normal. Enteric contrast material is identified throughout the colon up to the level of the rectum. Vascular/Lymphatic: Mild aortic atherosclerosis. No aneurysm. No abdominopelvic adenopathy. Reproductive: Uterus and bilateral adnexa are unremarkable. Other: No free fluid or fluid collections. No pneumoperitoneum identified. Small ventral midline abdominal wall hernia just below the level of the umbilicus is noted. This contains fat and a small amount of fluid. Musculoskeletal: Degenerative disc disease identified at L5-S1. No acute or suspicious osseous findings. IMPRESSION: 1. Persistent dilated small bowel loops with air-fluid levels compatible with partial small bowel obstruction. The transition point is in the right lower quadrant of the abdomen and may be secondary to adhesive disease. Enteric contrast material from previous CT has progressed to the colon up to the level of the rectum. No signs of pneumatosis or pneumoperitoneum. 2. Gallstones. 3. Aortic atherosclerosis. Aortic Atherosclerosis (ICD10-I70.0). Electronically Signed   By: Kerby Moors M.D.   On: 02/14/2021 12:31   CT  ABDOMEN PELVIS WO CONTRAST  Result Date: 02/12/2021 CLINICAL DATA:  Low back pain, abdominal pain, acute renal failure. EXAM: CT ABDOMEN AND PELVIS WITHOUT CONTRAST TECHNIQUE: Multidetector CT imaging of the abdomen and pelvis was performed following the standard protocol without IV contrast. COMPARISON:  Abdominal ultrasound dated 02/10/2021 and CT abdomen pelvis dated 02/09/2021. FINDINGS: Lower chest: There is a moderate bibasilar atelectasis. Hepatobiliary: No focal liver abnormality is seen. No gallstones, gallbladder wall thickening, or biliary dilatation. Pancreas: Unremarkable. No pancreatic ductal dilatation or surrounding inflammatory changes. Spleen: Normal in size without focal abnormality. Adrenals/Urinary Tract: Adrenal glands are  unremarkable. Kidneys appear normal on this non contrast exam, without renal calculi, focal lesion, or hydronephrosis. A Foley catheter is in place. Stomach/Bowel: Stomach is within normal limits. There is a high-grade small bowel obstruction with a transition point to decompressed distal small bowel within an infraumbilical ventral abdominal wall hernia containing a loop of small bowel. Multiple dilated loops of small bowel with air-fluid levels and fecalization of bowel contents are seen proximal to the obstruction. The appendix appears normal. No evidence of bowel inflammatory changes. Vascular/Lymphatic: Aortic atherosclerosis. No enlarged abdominal or pelvic lymph nodes. Reproductive: Uterus and bilateral adnexa are unremarkable. Other: Trace free fluid is seen in the pelvis. Musculoskeletal: Degenerative changes are seen at L5-S1. IMPRESSION: 1. High-grade small bowel obstruction with a transition point within an infraumbilical ventral abdominal wall hernia. Aortic Atherosclerosis (ICD10-I70.0). These results were called by telephone at the time of interpretation on 02/12/2021 at 2:39 pm to provider [facility] , who verbally acknowledged these results. Electronically Signed   By: Zerita Boers M.D.   On: 02/12/2021 14:40   DG Chest 1 View  Result Date: 02/14/2021 CLINICAL DATA:  50 year old female enteric tube advanced. EXAM: CHEST  1 VIEW COMPARISON:  0442 hours the same day and earlier. FINDINGS: Portable AP semi upright view at 0659 hours. Enteric tube advanced further into the stomach, side hole now the level of the gastric body. Continued gas-filled small bowel loops in the mid abdomen. Low lung volumes. Partially visible left neck approach central line. No acute osseous abnormality identified. IMPRESSION: 1. Enteric tube advanced with side hole now the level of the gastric body. 2. Continued gas pattern suspicious for small bowel obstruction or ileus. Electronically Signed   By: Genevie Ann M.D.    On: 02/14/2021 07:23   DG Chest 1 View  Result Date: 02/11/2021 CLINICAL DATA:  Central line placement EXAM: CHEST  1 VIEW COMPARISON:  02/09/2021 FINDINGS: Right internal jugular center venous catheter tip: Lower SVC. No pneumothorax. Low lung volumes. Subsegmental atelectasis along both hemidiaphragms. Cardiac and mediastinal margins appear normal. IMPRESSION: 1. Right internal jugular center venous catheter tip: Lower SVC. No pneumothorax. 2. Subsegmental atelectasis along both hemidiaphragms. Electronically Signed   By: Van Clines M.D.   On: 02/11/2021 15:02   DG Tibia/Fibula Left  Result Date: 03/01/2021 CLINICAL DATA:  Pain EXAM: LEFT TIBIA AND FIBULA - 2 VIEW COMPARISON:  None. FINDINGS: There is nonspecific soft tissue swelling about the lower extremity. There is no acute displaced fracture. No dislocation. No radiopaque foreign body. IMPRESSION: Nonspecific soft tissue swelling about the lower extremity. No acute displaced fracture or dislocation. Electronically Signed   By: Constance Holster M.D.   On: 03/01/2021 00:24   DG Abd 1 View  Result Date: 02/12/2021 CLINICAL DATA:  NG tube placement EXAM: ABDOMEN - 1 VIEW COMPARISON:  CT 02/12/2021 FINDINGS: NG tube tip is in the proximal stomach with the  side port in the distal esophagus. Dilated small bowel loops compatible with small bowel obstruction. IMPRESSION: NG tube tip in the proximal stomach. Electronically Signed   By: Rolm Baptise M.D.   On: 02/12/2021 15:38   CT Head Wo Contrast  Result Date: 02/09/2021 CLINICAL DATA:  Lower extremity numbness. EXAM: CT HEAD WITHOUT CONTRAST TECHNIQUE: Contiguous axial images were obtained from the base of the skull through the vertex without intravenous contrast. COMPARISON:  None. FINDINGS: Brain: Ventricles and sulci are normal in size and configuration. There is no intracranial mass, hemorrhage, extra-axial fluid collection, or midline shift. The brain parenchyma appears unremarkable. No  appreciable acute infarct. Vascular: No hyperdense vessel. There is calcification in each carotid siphon region. Skull: Bony calvarium a appears intact. Sinuses/Orbits: Opacification noted in a posterior ethmoid air cell on the right. Orbits appear symmetric bilaterally. Other: Mastoid air cells are clear. IMPRESSION: Normal appearing brain parenchyma.  No mass or hemorrhage. Foci of arterial vascular calcification noted. Opacification noted in a posterior right-sided ethmoid air cell. Electronically Signed   By: Lowella Grip III M.D.   On: 02/09/2021 11:01   CT THORACIC SPINE WO CONTRAST  Result Date: 02/12/2021 CLINICAL DATA:  Possible IV drug use. Left leg weakness and back pain. EXAM: CT THORACIC SPINE WITHOUT CONTRAST TECHNIQUE: Multidetector CT images of the thoracic were obtained using the standard protocol without intravenous contrast. COMPARISON:  None. FINDINGS: Alignment: Increased kyphotic curvature. Vertebrae: No fracture or primary bone lesion. No finding to suggest bone infection or disc space infection. Paraspinal and other soft tissues: Negative other than atelectasis or scarring in the lower lobes. Disc levels: Chronic disc space narrowing from T3-4 through T9-10. Small endplate osteophytes. No apparent bony stenosis of the canal or foramina. Ordinary mild facet osteoarthritis. No finding to suggest regional infection. IMPRESSION: Increased kyphotic curvature. Chronic degenerative disc disease from T3-4 through T9-10. No apparent bony stenosis of the canal or foramina. No finding to suggest bone infection or disc space infection. Electronically Signed   By: Nelson Chimes M.D.   On: 02/12/2021 13:17   CT LUMBAR SPINE WO CONTRAST  Result Date: 02/12/2021 CLINICAL DATA:  Possible IV drug use. Back pain and left leg numbness. EXAM: CT LUMBAR SPINE WITHOUT CONTRAST TECHNIQUE: Multidetector CT imaging of the lumbar spine was performed without intravenous contrast administration. Multiplanar  CT image reconstructions were also generated. COMPARISON:  None. FINDINGS: Segmentation: 5 lumbar type vertebral bodies. Alignment: 2 mm degenerative anterolisthesis L4-5. Vertebrae: No fracture or focal bone lesion. Chronic discogenic endplate sclerotic changes at L5-S1. No bone finding to suggest infection. Paraspinal and other soft tissues: Negative Disc levels: No significant finding from T12-L1 through L3-4. L4-5: Advanced bilateral facet arthropathy with gaping joints. 2 mm of anterolisthesis, which would likely worsen with standing or flexion. Bulging of the disc. Multifactorial stenosis that would likely worsen with standing or flexion. Neural compression could occur at this level. L5-S1: Chronic disc degeneration with loss of disc height. Endplate osteophytes and bulging of the disc. Vacuum phenomenon. Chronic endplate sclerosis without evidence of erosion. Bilateral foraminal stenosis could affect either L5 nerve. 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 IMPRESSION: 1. L4-5: Advanced bilateral facet arthropathy with gaping joints. 2 mm of anterolisthesis, which would likely worsen with standing or flexion. Bulging of the disc. Multifactorial stenosis that would likely worsen with standing or flexion. Neural compression could occur at this level. 2. L5-S1: Chronic disc degeneration with loss of disc height. Endplate osteophytes and bulging of the disc. Bilateral  foraminal stenosis could affect either L5 nerve. 3. 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 4. No finding to suggest regional spinal infection. Electronically Signed   By: Nelson Chimes M.D.   On: 02/12/2021 13:20   MR CERVICAL SPINE WO CONTRAST  Result Date: 02/13/2021 CLINICAL DATA:  Myelopathy, acute or progressive. Suspect infection. Drug abuse. EXAM: MRI CERVICAL SPINE WITHOUT CONTRAST TECHNIQUE: Multiplanar, multisequence MR imaging of the cervical spine was performed. No intravenous contrast was  administered. COMPARISON:  None. FINDINGS: Alignment: Mild retrolisthesis C5-6 otherwise normal alignment. Straightening of the cervical lordosis Vertebrae: Negative for fracture or mass. No evidence of spinal infection on unenhanced imaging. Cord: Normal signal and morphology. Posterior Fossa, vertebral arteries, paraspinal tissues: Edema in the right posterior paraspinous muscles extending from C4 through the upper thoracic spine. No focal fluid collection. Disc levels: C2-3: Mild left foraminal narrowing due to facet hypertrophy. C3-4: Mild facet degeneration bilaterally.  Negative for stenosis C4-5: Bilateral facet degeneration left greater than right. Disc degeneration and uncinate spurring. Mild foraminal narrowing bilaterally. C5-6: Mild retrolisthesis. Disc degeneration with diffuse uncinate spurring and bilateral facet degeneration. Moderate to severe foraminal encroachment bilaterally right greater than left. Mild spinal stenosis C6-7: Mild facet degeneration bilaterally. Mild right foraminal stenosis C7-T1: Mild right foraminal narrowing due to facet degeneration. IMPRESSION: Muscle edema in the right posterior paraspinous muscles without fluid collection. This extends into the thoracic spine and likely is due to myositis. No abscess Degenerative changes in the thoracic spine. Multilevel spinal and foraminal stenosis due to spurring. Electronically Signed   By: Franchot Gallo M.D.   On: 02/13/2021 10:03   MR THORACIC SPINE WO CONTRAST  Result Date: 02/13/2021 CLINICAL DATA:  Myelopathy, acute or progressive. History of drug abuse. EXAM: MRI THORACIC SPINE WITHOUT CONTRAST TECHNIQUE: Multiplanar, multisequence MR imaging of the thoracic spine was performed. No intravenous contrast was administered. COMPARISON:  None. FINDINGS: Alignment:  Normal alignment.  Mild thoracic kyphosis. Vertebrae: Normal bone marrow. Negative for fracture, mass, or bone infection. No evidence of discitis. Cord: Cord  evaluation limited by motion. There is significant CSF flow artifact in the spinal canal. Allowing for this, no suspicious cord lesion identified. Paraspinal and other soft tissues: Extensive edema in the right paraspinous muscles. This is only seen on the right side. Diffuse low-density edema is present without focal fluid collection. This extends into the cervical spine as described separately. Disc levels: Multilevel disc degeneration with disc space narrowing and mild spurring throughout much of the thoracic spine. No focal disc protrusion or significant spinal stenosis. IMPRESSION: Extensive muscle edema on the right involving the posterior paraspinous muscles. Findings most compatible with myositis. No abscess identified on unenhanced images. Motion degraded study. Electronically Signed   By: Franchot Gallo M.D.   On: 02/13/2021 10:21   MR LUMBAR SPINE WO CONTRAST  Result Date: 02/13/2021 CLINICAL DATA:  Acute myelopathy.  History of drug abuse. EXAM: MRI LUMBAR SPINE WITHOUT CONTRAST TECHNIQUE: Multiplanar, multisequence MR imaging of the lumbar spine was performed. No intravenous contrast was administered. COMPARISON:  CT lumbar spine 02/12/2021 FINDINGS: Segmentation:  Normal Motion degraded study. Patient not able to tolerate axial images and terminated the study early. Alignment: Slight anterolisthesis L4-5 and mild retrolisthesis L5-S1 Vertebrae: No evidence of fracture or mass. No bone marrow edema. No evidence of bone infection or discitis. Conus medullaris and cauda equina: Conus extends to the approximately L1-2 level. Conus and cauda equina appear normal. Paraspinal and other soft tissues: Diffuse muscle edema in the right  posterior paraspinous muscles. This extends into the cervical and thoracic spine as reported separately. No fluid collection identified. Disc levels: L1-2: Negative L2-3: Negative L3-4: Negative L4-5: Mild anterolisthesis. Disc bulging and bilateral facet degeneration.  Moderate spinal stenosis as identified on CT. L5-S1: Disc degeneration with diffuse posterior endplate spurring. Moderate subarticular and foraminal stenosis bilaterally due to spurring. IMPRESSION: 1. Right posterior muscle edema throughout the lumbar spine extending into the thoracic and cervical spine most consistent with myositis. No abscess or fluid collection identified 2. 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 Electronically Signed   By: Franchot Gallo M.D.   On: 02/13/2021 10:25   NM Hepatobiliary Liver Func  Result Date: 02/15/2021 CLINICAL DATA:  Evaluate for cholecystitis. EXAM: NUCLEAR MEDICINE HEPATOBILIARY IMAGING TECHNIQUE: Sequential images of the abdomen were obtained out to 60 minutes following intravenous administration of radiopharmaceutical. RADIOPHARMACEUTICALS:  5.5 mCi Tc-59m Choletec IV COMPARISON:  CT AP 02/14/2021 and right upper quadrant sonogram 02/13/2021 FINDINGS: Prompt uptake and biliary excretion of activity by the liver is seen. Prompt biliary activity passes into small bowel, consistent with patent common bile duct. After 60 minutes of dynamic imaging there is equivocal early filling of the gallbladder. Subsequently the patient received 3 mg of morphine sulfate, IV. Imaging was carried out for an additional 30 minutes with partial filling of the gallbladder confirming patency of the cystic duct. IMPRESSION: 1. Delayed filling of the gallbladder following IV administration of morphine sulfate confirming patency of the cystic duct and excluding acute cholecystitis. Imaging findings however may reflect chronic cholecystitis. 2. Patent common bile duct with prompt biliary to bowel activity. Electronically Signed   By: TKerby MoorsM.D.   On: 02/15/2021 08:26   [place]NAL  Result Date: 02/15/2021 CLINICAL DATA:  Acute renal insufficiency EXAM: RENAL / URINARY TRACT ULTRASOUND COMPLETE COMPARISON:  None. FINDINGS: Right Kidney: Renal  measurements: 11.2 x 5.2 x 5.2 cm = volume: 159 mL. Echogenicity within normal limits. No mass or hydronephrosis visualized. Slightly limited evaluation of the lower pole secondary to overlying bowel gas. Left Kidney: Renal measurements: 10.8 x 5.8 x 5.7 cm = volume: 186 mL. Echogenicity within normal limits. No mass or hydronephrosis visualized. Slightly limited evaluation of the lower pole secondary to overlying bowel gas. Bladder: A Foley catheter balloon is seen within the pelvis with complete decompression of the bladder. Other: Cholelithiasis is incidentally noted. Small splenule noted adjacent to the inferior pole of the spleen. IMPRESSION: Slightly limited but unremarkable renal sonogram. Cholelithiasis Electronically Signed   By: AFidela SalisburyMD   On: 02/15/2021 17:26   IR Fluoro Guide CV Line Right  Result Date: 02/22/2021 INDICATION: 50year old female with acute kidney injury with need for long-term hemodialysis. EXAM: TUNNELED CENTRAL VENOUS HEMODIALYSIS CATHETER PLACEMENT WITH ULTRASOUND AND FLUOROSCOPIC GUIDANCE MEDICATIONS: Ancef 2 gm IV . The antibiotic was given in an appropriate time interval prior to skin puncture. ANESTHESIA/SEDATION: Moderate (conscious) sedation was employed during this procedure. A total of Versed 2 mg and Fentanyl 150 mcg was administered intravenously. Moderate Sedation Time: 16 minutes. The patient's level of consciousness and vital signs were monitored continuously by radiology nursing throughout the procedure under my direct supervision. FLUOROSCOPY TIME:  0 minutes 30 seconds (6 mGy). COMPLICATIONS: None immediate. PROCEDURE: Informed written consent was obtained from the patient after a discussion of the risks, benefits, and alternatives to treatment. Questions regarding the procedure were encouraged and answered. The right neck and chest were prepped with chlorhexidine  in a sterile fashion, and a sterile drape was applied covering the operative field. Maximum  barrier sterile technique with sterile gowns and gloves were used for the procedure. A timeout was performed prior to the initiation of the procedure. After creating a small venotomy incision, a 21 gauge micropuncture kit was utilized to access the internal jugular vein. Real-time ultrasound guidance was utilized for vascular access including the acquisition of a permanent ultrasound image documenting patency of the accessed vessel. A Wholey wire was advanced to the level of the IVC and the micropuncture sheath was exchanged for an 9 Fr dilator. A 14.5 French tunneled hemodialysis catheter measuring 23 cm from tip to cuff was tunneled in a retrograde fashion from the anterior chest wall to the venotomy incision. Serial dilation was then performed an a peel-away sheath was placed. The catheter was then placed through the peel-away sheath with the catheter tip ultimately positioned within the right atrium. Final catheter positioning was confirmed and documented with a spot radiographic image. The catheter aspirates and flushes normally. The catheter was flushed with appropriate volume heparin dwells. The catheter exit site was secured with a 0-Prolene retention suture. The venotomy incision was closed with Dermabond. Sterile dressings were applied. The patient tolerated the procedure well without immediate post procedural complication. IMPRESSION: Successful placement of 23 cm tip to cuff tunneled hemodialysis catheter via the right internal jugular vein with catheter tip terminating within the right atrium. The catheter is ready for immediate use. Ruthann Cancer, MD [facility] Radiology Electronically Signed   By: Ruthann Cancer MD   On: 02/22/2021 11:42   IR Removal Tun Cv Cath W/O FL  Result Date: 03/01/2021 INDICATION: Patient with history of AKI s/p non tunneled HD catheter placement bedside by CCM 02/09/2021; s/p right IJ tunneled HD catheter placement in IR  02/22/2021. Patient now in renal recovery. Request is made for removal of tunneled HD catheter. EXAM: REMOVAL OF TUNNELED HEMODIALYSIS CATHETER MEDICATIONS: 8 mL 1% lidocaine COMPLICATIONS: None immediate. PROCEDURE: Informed written consent was obtained from the patient following an explanation of the procedure, risks, benefits and alternatives to treatment. A time out was performed prior to the initiation of the procedure. Maximal barrier sterile technique was utilized including mask, sterile gloves, large sterile drape, hand hygiene, and ChloraPrep. 1% lidocaine was injected under sterile conditions along the subcutaneous tunnel. Utilizing a combination of blunt dissection and gentle traction, the catheter was removed intact. Hemostasis was obtained with manual compression. A dressing was placed. The patient tolerated the procedure well without immediate post procedural complication. IMPRESSION: Successful removal of tunneled dialysis catheter. Read by: Earley Abide, PA-C Electronically Signed   By: Corrie Mckusick D.O.   On: 03/01/2021 16:57   IR US Guide Vasc Access Right  Result Date: 02/22/2021 INDICATION: 50 year old female with acute kidney injury with need for long-term hemodialysis. EXAM: TUNNELED CENTRAL VENOUS HEMODIALYSIS CATHETER PLACEMENT WITH ULTRASOUND AND FLUOROSCOPIC GUIDANCE MEDICATIONS: Ancef 2 gm IV . The antibiotic was given in an appropriate time interval prior to skin puncture. ANESTHESIA/SEDATION: Moderate (conscious) sedation was employed during this procedure. A total of Versed 2 mg and Fentanyl 150 mcg was administered intravenously. Moderate Sedation Time: 16 minutes. The patient's level of consciousness and vital signs were monitored continuously by radiology nursing throughout the procedure under my direct supervision. FLUOROSCOPY TIME:  0 minutes 30 seconds (6 mGy). COMPLICATIONS: None immediate. PROCEDURE: Informed written consent was obtained from the patient after a  discussion of the risks, benefits,  and alternatives to treatment. Questions regarding the procedure were encouraged and answered. The right neck and chest were prepped with chlorhexidine in a sterile fashion, and a sterile drape was applied covering the operative field. Maximum barrier sterile technique with sterile gowns and gloves were used for the procedure. A timeout was performed prior to the initiation of the procedure. After creating a small venotomy incision, a 21 gauge micropuncture kit was utilized to access the internal jugular vein. Real-time ultrasound guidance was utilized for vascular access including the acquisition of a permanent ultrasound image documenting patency of the accessed vessel. A Wholey wire was advanced to the level of the IVC and the micropuncture sheath was exchanged for an 9 Fr dilator. A 14.5 French tunneled hemodialysis catheter measuring 23 cm from tip to cuff was tunneled in a retrograde fashion from the anterior chest wall to the venotomy incision. Serial dilation was then performed an a peel-away sheath was placed. The catheter was then placed through the peel-away sheath with the catheter tip ultimately positioned within the right atrium. Final catheter positioning was confirmed and documented with a spot radiographic image. The catheter aspirates and flushes normally. The catheter was flushed with appropriate volume heparin dwells. The catheter exit site was secured with a 0-Prolene retention suture. The venotomy incision was closed with Dermabond. Sterile dressings were applied. The patient tolerated the procedure well without immediate post procedural complication. IMPRESSION: Successful placement of 23 cm tip to cuff tunneled hemodialysis catheter via the right internal jugular vein with catheter tip terminating within the right atrium. The catheter is ready for immediate use. Ruthann Cancer, MD [facility] Radiology  Electronically Signed   By: Ruthann Cancer MD   On: 02/22/2021 11:42   DG Chest Port 1 View  Result Date: 02/13/2021 CLINICAL DATA:  Central line placement. EXAM: PORTABLE CHEST 1 VIEW COMPARISON:  02/12/2021 and CT chest 02/09/2021. FINDINGS: Removal of right IJ catheter with placement of a left IJ catheter. Tip is in the low SVC. Nasogastric tube terminates in the stomach with the side port in the region of the gastroesophageal junction. Heart size is accentuated by AP semi upright technique and low lung volumes. Minimal streaky atelectasis in the left lung base. No definite pleural fluid. No pneumothorax. IMPRESSION: 1. Interval left IJ central line placement without complicating feature. 2. Nasogastric tube could be advanced several cm to better position the side port below the gastroesophageal junction, as clinically indicated. 3. Low lung volumes with minimal left basilar subsegmental atelectasis Electronically Signed   By: Lorin Picket M.D.   On: 02/13/2021 12:50   DG CHEST PORT 1 VIEW  Result Date: 02/12/2021 CLINICAL DATA:  Central line complication. Concern for location of central line. EXAM: PORTABLE CHEST 1 VIEW COMPARISON:  Prior chest radiograph 02/11/2021. FINDINGS: Right IJ approach central venous catheter with tip projecting in the region of the mid to upper SVC. Shallow inspiration radiograph. Heart size within normal limits. As before, there is subsegmental atelectasis within both lung bases. No appreciable pleural effusion or evidence of pneumothorax. No acute bony abnormality identified. IMPRESSION: Right IJ approach central venous catheter with tip projecting in the region of the mid to upper SVC. Shallow inspiration radiograph. Redemonstrated bibasilar subsegmental atelectasis. Electronically Signed   By: Kellie Simmering DO   On: 02/12/2021 13:40   DG Chest Port 1 View  Result Date: 02/09/2021 CLINICAL DATA:  Central line placement EXAM: PORTABLE CHEST 1 VIEW COMPARISON:  Portable  exam 1346 hours  compared to 0842 hours FINDINGS: RIGHT jugular central venous catheter with tip projecting over SVC. Normal heart size, mediastinal contours, and pulmonary vascularity. LEFT basilar atelectasis. Remaining lungs clear. No pulmonary infiltrate, pleural effusion, or pneumothorax. Osseous structures unremarkable. IMPRESSION: No pneumothorax following RIGHT jugular line placement. Subsegmental atelectasis LEFT base. Electronically Signed   By: Lavonia Dana M.D.   On: 02/09/2021 14:26   DG Chest Portable 1 View  Result Date: 02/09/2021 CLINICAL DATA:  Shortness of breath EXAM: PORTABLE CHEST 1 VIEW COMPARISON:  Field FINDINGS: There is slight atelectasis left base. Lungs otherwise are clear. Heart size and pulmonary vascularity are normal. No adenopathy. No bone lesions. IMPRESSION: Mild left base atelectasis. Lungs elsewhere clear. Heart size normal. Electronically Signed   By: Lowella Grip III M.D.   On: 02/09/2021 08:55   DG Abd Portable 1V  Result Date: 02/14/2021 CLINICAL DATA:  Small-bowel obstruction. EXAM: PORTABLE ABDOMEN - 1 VIEW COMPARISON:  Radiograph 02/12/2021 FINDINGS: Transesophageal tube tip terminates in the left upper quadrant with the side port near the GE junction. Recommend advancing 3-5 cm for optimal positioning. Telemetry leads and external devices overlie the upper abdomen and lower chest. Persistently air distended loops of clustered small bowel in the mid to upper abdomen. Overall degree of distention is not significantly changed from prior. No suspicious abdominal calcifications. No acute osseous abnormality or suspicious osseous lesion. Remaining soft tissues are unremarkable. IMPRESSION: 1. Unchanged small bowel dilatation compatible with obstruction. 2. Transesophageal tube tip terminates in the left upper quadrant with the side port near the GE junction. Recommend advancing 3-5 cm for optimal positioning. These results will be called to the ordering clinician or  representative by the Radiologist Assistant, and communication documented in the PACS or [employer]. Electronically Signed   By: Lovena Le M.D.   On: 02/14/2021 05:27   DG Foot 2 Views Left  Result Date: 03/01/2021 CLINICAL DATA:  Pain EXAM: LEFT FOOT - 2 VIEW COMPARISON:  None. FINDINGS: There is soft tissue swelling about the lower extremity. There is a small plantar calcaneal spur. There is no acute displaced fracture or dislocation. No radiopaque foreign body. IMPRESSION: Soft tissue swelling about the lower extremity. No acute fracture or dislocation. Small plantar calcaneal spur. Electronically Signed   By: Constance Holster M.D.   On: 03/01/2021 00:24   ECHOCARDIOGRAM COMPLETE  Result Date: 02/11/2021    ECHOCARDIOGRAM REPORT   Patient Name:   KARINNE SCHMADER Date of Exam: 02/11/2021 Medical Rec #:  [id number]       Height:       65.0 in Accession #:    [id number]      Weight:       238.3 lb Date of Birth:  03-09-1971       BSA:          2.131 m Patient Age:    14 years        BP:           108/46 mmHg Patient Gender: F               HR:           70 bpm. 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  Sonographer:  Tiffany Dance Referring Phys: [place] Comments: No subcostal window. IMPRESSIONS  1. Left ventricular ejection fraction, by estimation, is 60 to 65%. The left ventricle has normal function. The left ventricle has no regional wall motion abnormalities. Left ventricular diastolic parameters were normal.  2. Right ventricular systolic function was not well visualized. The right ventricular size is not well visualized. Tricuspid regurgitation signal is inadequate for assessing PA pressure.  3. The mitral valve is normal in structure. No evidence of mitral  valve regurgitation. No evidence of mitral stenosis.  4. The aortic valve is normal in structure. Aortic valve regurgitation is not visualized. No aortic stenosis is present.  5. The inferior vena cava is normal in size with greater than 50% respiratory variability, suggesting right atrial pressure of 3 mmHg. FINDINGS  Left Ventricle: Left ventricular ejection fraction, by estimation, is 60 to 65%. The left ventricle has normal function. The left ventricle has no regional wall motion abnormalities. Definity contrast agent was given IV to delineate the left ventricular  endocardial borders. The left ventricular internal cavity size was normal in size. There is no left ventricular hypertrophy. Left ventricular diastolic parameters were normal. Normal left ventricular filling pressure. Right Ventricle: The right ventricular size is not well visualized. Right vetricular wall thickness was not assessed. Right ventricular systolic function was not well visualized. Tricuspid regurgitation signal is inadequate for assessing PA pressure. Left Atrium: Left atrial size was normal in size. Right Atrium: Right atrial size was normal in size. Pericardium: There is no evidence of pericardial effusion. Mitral Valve: The mitral valve is normal in structure. No evidence of mitral valve regurgitation. No evidence of mitral valve stenosis. Tricuspid Valve: The tricuspid valve is normal in structure. Tricuspid valve regurgitation is trivial. No evidence of tricuspid stenosis. Aortic Valve: The aortic valve is normal in structure. Aortic valve regurgitation is not visualized. No aortic stenosis is present. Pulmonic Valve: The pulmonic valve was normal in structure. Pulmonic valve regurgitation is not visualized. No evidence of pulmonic stenosis. Aorta: The aortic root is normal in size and structure. Venous: The inferior vena cava was not well visualized. The inferior vena cava is normal in size with greater than 50% respiratory  variability, suggesting right atrial pressure of 3 mmHg. IAS/Shunts: No atrial level shunt detected by color flow Doppler.  LEFT VENTRICLE PLAX 2D LVIDd:         4.30 cm  Diastology LVIDs:         3.00 cm  LV e' medial:    8.81 cm/s LV PW:         1.00 cm  LV E/e' medial:  9.5 LV IVS:        0.90 cm  LV e' lateral:   9.79 cm/s LVOT diam:     2.10 cm  LV E/e' lateral: 8.6 LV SV:         74 LV SV Index:   35 LVOT Area:     3.46 cm  LEFT ATRIUM             Index LA diam:        3.50 cm 1.64 cm/m LA Vol (A2C):   45.3 ml 21.26 ml/m LA Vol (A4C):   30.5 ml 14.31 ml/m LA Biplane Vol: 40.4 ml 18.96 ml/m  AORTIC VALVE LVOT Vmax:   133.00 cm/s LVOT Vmean:  78.900 cm/s LVOT VTI:    0.215 m  AORTA Ao Root diam: 3.10 cm Ao Asc diam:  2.80  cm MITRAL VALVE MV Area (PHT): 2.87 cm    SHUNTS MV Decel Time: 264 msec    Systemic VTI:  0.22 m MV E velocity: 83.90 cm/s  Systemic Diam: 2.10 cm MV A velocity: 81.90 cm/s MV E/A ratio:  1.02 Fransico Him MD Electronically signed by Fransico Him MD Signature Date/Time: 02/11/2021/1:42:44 PM    Final    CT Angio Chest/Abd/Pel for Dissection W and/or W/WO  Result Date: 02/09/2021 CLINICAL DATA:  Lower abdominal pain, left leg numbness. EXAM: CT ANGIOGRAPHY CHEST, ABDOMEN AND PELVIS TECHNIQUE: Non-contrast CT of the chest was initially obtained. Multidetector CT imaging through the chest, abdomen and pelvis was performed using the standard protocol during bolus administration of intravenous contrast. Multiplanar reconstructed images and MIPs were obtained and reviewed to evaluate the vascular anatomy. CONTRAST:  163m OMNIPAQUE IOHEXOL 350 MG/ML SOLN COMPARISON:  None. 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 Heart is normal size. Aorta is normal caliber. No dissection no filling defects in the central pulmonary arteries to suggest pulmonary emboli. Mediastinum/Nodes: No mediastinal, hilar, or axillary adenopathy.  Trachea and esophagus are unremarkable. Thyroid unremarkable. Lungs/Pleura: Linear densities at the left base in the lingula, likely scarring. No confluent opacities or effusions. Musculoskeletal: Chest wall soft tissues are unremarkable. No acute bony abnormality. Review of the MIP images confirms the above findings. CTA ABDOMEN AND PELVIS FINDINGS VASCULAR Aorta: Normal caliber aorta without aneurysm, dissection, vasculitis or significant stenosis. Celiac: Patent without evidence of aneurysm, dissection, vasculitis or significant stenosis. SMA: Patent without evidence of aneurysm, dissection, vasculitis or significant stenosis. Renals: Both renal arteries are patent. No proximal stenosis. Both renal arteries appear small caliber in the renal hila. IMA: Very small, difficult to visualize.  Appears patent. Inflow: Patent without evidence of aneurysm, dissection, vasculitis or significant stenosis. Veins: No obvious venous abnormality within the limitations of this arterial phase study. Review of the MIP images confirms the above findings. NON-VASCULAR Hepatobiliary: No focal hepatic abnormality. Gallbladder unremarkable. Pancreas: No focal abnormality or ductal dilatation. Spleen: No focal abnormality.  Normal size. Adrenals/Urinary Tract: There are areas of wedge-shaped decreased perfusion noted throughout both kidneys. There is no surrounding perinephric stranding. No hydronephrosis. Adrenal glands and urinary bladder unremarkable. Stomach/Bowel: Stomach, large and small bowel grossly unremarkable. Small infraumbilical ventral hernia containing a small bowel loop. No evidence of bowel obstruction. Lymphatic: Aortic atherosclerosis. No evidence of aneurysm or adenopathy. Reproductive: Uterus and adnexa unremarkable.  No mass. Other: No free fluid or free air. Musculoskeletal: No acute bony abnormality. Degenerative changes in the lower lumbar spine. Review of the MIP images confirms the above findings. IMPRESSION:  No evidence of aortic aneurysm or dissection. Proximal renal arteries are patent and free of disease. However, the vessels appear small caliber bilaterally within the renal hila. This is associated with extensive wedge-shaped areas of hypoperfusion throughout both kidneys. This is felt to most likely reflect some form of microvascular disease or vasoconstriction. 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 This is less likely pyelonephritis given the extensive nature of the finding and lack of perinephric stranding. Small infraumbilical ventral hernia containing small bowel loop. No bowel obstruction. These results were called by telephone at the time of interpretation on 02/09/2021 at 11:10 am to provider [facility], who verbally acknowledged these results. Electronically Signed   By: KRolm BaptiseM.D.   On: 02/09/2021 11:10   VAS [place]OWER EXTREMITY VENOUS (DVT)  Result Date: 03/02/2021  Lower Venous DVT Study  Indications: Swelling, and Pain.  Comparison Study: 02/16/21- negative left lower extremity venous duplex Performing Technologist: Maudry Mayhew MHA, RDMS, RVT, RDCS  Examination Guidelines: A complete evaluation includes B-mode imaging, spectral Doppler, color Doppler, and power Doppler as needed of all accessible portions of each vessel. Bilateral testing is considered an integral part of a complete examination. Limited examinations for reoccurring indications may be performed as noted. The reflux portion of the exam is performed with the patient in reverse Trendelenburg.  +-----+---------------+---------+-----------+----------+--------------+ RIGHTCompressibilityPhasicitySpontaneityPropertiesThrombus Aging +-----+---------------+---------+-----------+----------+--------------+ CFV  Full           Yes      Yes                                 +-----+---------------+---------+-----------+----------+--------------+    +---------+---------------+---------+-----------+----------+--------------+ LEFT     CompressibilityPhasicitySpontaneityPropertiesThrombus Aging +---------+---------------+---------+-----------+----------+--------------+ CFV      Full           Yes      Yes        patent                   +---------+---------------+---------+-----------+----------+--------------+ SFJ      Full                               patent                   +---------+---------------+---------+-----------+----------+--------------+ FV Prox  Full                               patent                   +---------+---------------+---------+-----------+----------+--------------+ FV Mid   Full                               patent                   +---------+---------------+---------+-----------+----------+--------------+ FV Distal                        Yes        patent                   +---------+---------------+---------+-----------+----------+--------------+ PFV      Full                               patent                   +---------+---------------+---------+-----------+----------+--------------+ POP      Full           Yes      Yes        patent                   +---------+---------------+---------+-----------+----------+--------------+ PTV      Full                               patent                   +---------+---------------+---------+-----------+----------+--------------+ PERO     Full  patent                   +---------+---------------+---------+-----------+----------+--------------+     Summary: RIGHT: - No evidence of common femoral vein obstruction.  LEFT: - There is no evidence of deep vein thrombosis in the lower extremity.  - No cystic structure found in the popliteal fossa.  *See table(s) above for measurements and observations. Electronically signed by Servando Snare MD on 03/02/2021 at 5:12:14 PM.    Final    VAS [place] LOWER  EXTREMITY VENOUS (DVT)  Result Date: 02/16/2021  Lower Venous DVT Study Indications: Swelling, and Pain.  Comparison Study: No prior. Performing Technologist: Oda Cogan RDMS, RVT  Examination Guidelines: A complete evaluation includes B-mode imaging, spectral Doppler, color Doppler, and power Doppler as needed of all accessible portions of each vessel. Bilateral testing is considered an integral part of a complete examination. Limited examinations for reoccurring indications may be performed as noted. The reflux portion of the exam is performed with the patient in reverse Trendelenburg.  +---------+---------------+---------+-----------+----------+--------------+ LEFT     CompressibilityPhasicitySpontaneityPropertiesThrombus Aging +---------+---------------+---------+-----------+----------+--------------+ CFV      Full           Yes      Yes                                 +---------+---------------+---------+-----------+----------+--------------+ SFJ      Full                                                        +---------+---------------+---------+-----------+----------+--------------+ FV Prox  Full                                                        +---------+---------------+---------+-----------+----------+--------------+ FV Mid   Full                                                        +---------+---------------+---------+-----------+----------+--------------+ FV DistalFull                                                        +---------+---------------+---------+-----------+----------+--------------+ PFV      Full                                                        +---------+---------------+---------+-----------+----------+--------------+ POP      Full           Yes      Yes                                 +---------+---------------+---------+-----------+----------+--------------+  PTV      Full                                                         +---------+---------------+---------+-----------+----------+--------------+ PERO     Full                                                        +---------+---------------+---------+-----------+----------+--------------+     Summary: LEFT: - There is no evidence of deep vein thrombosis in the lower extremity.  - No cystic structure found in the popliteal fossa.  *See table(s) above for measurements and observations. Electronically signed by Monica Martinez MD on 02/16/2021 at 6:26:12 PM.    Final    US ABDOMEN LIMITED RUQ (LIVER/GB)  Result Date: 02/13/2021 CLINICAL DATA:  Acute cholecystitis. EXAM: ULTRASOUND ABDOMEN LIMITED RIGHT UPPER QUADRANT COMPARISON:  CT abdomen pelvis 02/12/2021. FINDINGS: Gallbladder: Numerous shadowing echogenic stones in the gallbladder, measuring up to 1.2 cm. Sludge is seen as well. No wall thickening. Negative sonographic Murphy sign. Common bile duct: Diameter: Suboptimally visualized due to body habitus, measures approximately 3 mm. Liver: Suboptimally visualized due to body habitus. Diffusely increased in echogenicity. No definite focal lesion. Portal vein is patent on color Doppler imaging with normal direction of blood flow towards the liver. Other: None. IMPRESSION: 1. Cholelithiasis and sludge.  No evidence of acute cholecystitis. 2. Hepatic steatosis. Electronically Signed   By: Lorin Picket M.D.   On: 02/13/2021 14:16   US Abdomen Limited RUQ (LIVER/GB)  Result Date: 02/10/2021 CLINICAL DATA:  50 year old female with right upper quadrant abdominal pain. Dialysis patient. EXAM: ULTRASOUND ABDOMEN LIMITED RIGHT UPPER QUADRANT COMPARISON:  CTA chest and abdomen yesterday. FINDINGS: Gallbladder: Shadowing echogenic gallstones (image 5) individual stone size estimated at 15-20 mm. Gallbladder wall thickness remains normal. No pericholecystic fluid. Some superimposed gallbladder sludge. However, positive sonographic Percell Miller sign is reported.  Common bile duct: Diameter: 6-7 mm, upper limits of normal. Liver: No focal lesion identified. Within normal limits in parenchymal echogenicity. Portal vein is patent on color Doppler imaging with normal direction of blood flow towards the liver. Other: No free fluid. IMPRESSION: 1. Difficult to exclude acute cholecystitis: positive for gallstones, sludge, and a sonographic [employer]. But there is no evidence of gallbladder wall thickening. 2. Upper limits of normal CBD.  Liver parenchyma appears negative. Electronically Signed   By: Genevie Ann M.D.   On: 02/10/2021 09:45    Microbiology: No results found for this or any previous visit (from the past 240 hour(s)).   Labs: Basic Metabolic Panel: Recent Labs  Lab 03/03/21 0204 03/04/21 0039 03/05/21 0013 03/06/21 0029 03/07/21 0231  NA 138 138 138 138 139  K 3.7 3.9 4.5 3.6 3.6  CL 103 107 106 106 109  CO2 '26 25 28 22 24  ' GLUCOSE 97 96 92 91 92  BUN 47* 43* 38* 35* 31*  CREATININE 3.54* 3.19* 2.95* 2.79* 2.39*  CALCIUM 8.6* 8.5* 8.5* 8.4* 8.7*  PHOS 4.5 4.9* 4.6 4.7* 5.0*   Liver Function Tests: Recent Labs  Lab 03/03/21 0204 03/04/21 0039 03/05/21 0013 03/06/21 0029 03/07/21 0231  ALBUMIN 2.4* 2.3* 2.6* 2.4*  2.6*   No results for input(s): LIPASE, AMYLASE in the last 168 hours. No results for input(s): AMMONIA in the last 168 hours. CBC: Recent Labs  Lab 02/28/21 1944 03/01/21 0111 03/04/21 0039  WBC  --  8.5 8.4  HGB 9.4* 9.1* 8.8*  HCT 28.1* 27.2* 27.3*  MCV  --  89.2 93.5  PLT  --  195 263   Cardiac Enzymes: Recent Labs  Lab 03/01/21 0925  CKTOTAL 168   BNP: BNP (last 3 results) No results for input(s): BNP in the last 8760 hours.  ProBNP (last 3 results) No results for input(s): PROBNP in the last 8760 hours.  CBG: No results for input(s): GLUCAP in the last 168 hours.     Signed:  Erin Hearing ANP [facility] 03/07/2021, 9:23 AM

## 2021-03-06 NOTE — Progress Notes (Signed)
Physical Therapy Treatment Patient Details Name: Renee Wilkinson MRN: [id number] DOB: Nov 12, 1971 Today's Date: 03/06/2021    History of Present Illness Pt is a 50 yo female, admitted to [facility] on 3/17 after being found down (next to deceased spouse) after smoking crack cocaine and  with AKI (renal infarcts), hyperkalemia, rhabdomyolysis, and leukocytosis. Pt required CRRT and intubation at admission.  Pt's renal function improved and now off HD.  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 Pt with hx of substance abuse.    PT Comments    Pt making good progress today.  Demonstrating good use of rollator with supervision and increased gait to 30'x2 limited by L LE pain (getting close to time for pain meds). Noted plan is for pt to go to Mother's house in [place].  Recommend HHPT and supervision for mobility.  Do recommend rollator for balance, pressure relief, and to allow for rest breaks.     Follow Up Recommendations  Supervision for mobility/OOB;Home health PT     Equipment Recommendations  Other (comment) (rollator)    Recommendations for Other Services       Precautions / Restrictions Precautions Precautions: Fall Precaution Comments: L LE swelling/pain Restrictions Weight Bearing Restrictions: No    Mobility  Bed Mobility Overal bed mobility: Independent                  Transfers Overall transfer level: Needs assistance Equipment used: 4-wheeled walker Transfers: Sit to/from Stand Sit to Stand: Supervision Stand pivot transfers: Supervision       General transfer comment: Performed from bed and from rollator.  Good use of brakes on rollator to sit  Ambulation/Gait Ambulation/Gait assistance: Min guard Gait Distance (Feet): 30 Feet  (30'x2) Assistive device: 4-wheeled walker Gait Pattern/deviations: Step-to pattern;Decreased stance time - left;Decreased weight shift to left     General Gait Details: Step to gait with mild antatgic pattern.  Pt prefers use of rollator for rest breaks and lightly applies brakes during ambulation to give more stability/control   Stairs             Wheelchair Mobility    Modified Rankin (Stroke Patients Only)       Balance Overall balance assessment: Needs assistance Sitting-balance support: No upper extremity supported Sitting balance-Leahy Scale: Good     Standing balance support: Bilateral upper extremity supported;No upper extremity supported Standing balance-Leahy Scale: Fair Standing balance comment: Static stand without support; bil UE for ambulation                            Cognition Arousal/Alertness: Awake/alert Behavior During Therapy: WFL for tasks assessed/performed Overall Cognitive Status: Within Functional Limits for tasks assessed Area of Impairment: Awareness;Attention                   Current Attention Level: Alternating       Awareness: Anticipatory   General Comments: Pt with much improved problem solving, ability to correct self and continues use of note pad as memory aid. anticipatory awareness, good safety awareness      Exercises General Exercises - Lower Extremity Ankle Circles/Pumps: AROM;Both;10 reps;Supine Quad Sets: AROM;Both;10 reps;Supine Heel Slides: AROM;Right;AAROM;Left;10 reps;Supine Hip ABduction/ADduction: AROM;Right;AAROM;Left;10 reps;Supine    General Comments General comments (skin integrity, edema, etc.): VSS  Pertinent Vitals/Pain Pain Assessment: Faces Faces Pain Scale: Hurts even more Pain Location: L LE with dependent position and weightbearing Pain Descriptors / Indicators: Guarding;Sore;Spasm;Numbness Pain Intervention(s): Monitored during session;Repositioned;Limited activity  within patient's tolerance;Other (comment) (pain meds due in about an houre - pt and RN aware)    Home Living                      Prior Function            PT Goals (current goals can now be found in the care plan section) Acute Rehab PT Goals Patient Stated Goal: pain control, feel better PT Goal Formulation: With patient Time For Goal Achievement: 02/26/21 Potential to Achieve Goals: Good Progress towards PT goals: Progressing toward goals    Frequency    Min 3X/week      PT Plan Current plan remains appropriate    Co-evaluation              AM-PAC PT "6 Clicks" Mobility   Outcome Measure  Help needed turning from your back to your side while in a flat bed without using bedrails?: None Help needed moving from lying on your back to sitting on the side of a flat bed without using bedrails?: None Help needed moving to and from a bed to a chair (including a wheelchair)?: A Little Help needed standing up from a chair using your arms (e.g., wheelchair or bedside chair)?: A Little Help needed to walk in hospital room?: A Little Help needed climbing 3-5 steps with a railing? : A Little 6 Click Score: 20    End of Session Equipment Utilized During Treatment: Gait belt Activity Tolerance: Patient limited by pain Patient left: with call bell/phone within reach;in chair;with nursing/sitter in room Nurse Communication: Mobility status PT Visit Diagnosis: Muscle weakness (generalized) (M62.81);Pain;History of falling (Z91.81);Difficulty in walking, not elsewhere classified (R26.2) Pain - Right/Left: Left Pain - part of body: Leg     Time: 1340-1405 PT Time Calculation (min) (ACUTE ONLY): 25 min  Charges:  $Gait Training: 8-22 mins $Therapeutic Exercise: 8-22 mins                     Abran Richard, PT Acute Rehab Services Pager [phone] [facility] [place] 03/06/2021, 2:20 PM

## 2021-03-06 NOTE — Progress Notes (Signed)
[facility] PROGRESS NOTE  Renee Wilkinson FAO:[id number] DOB: 05/27/1971 DOA: 02/09/2021 PCP: Patient, No Pcp Per (Inactive)  Status: Remains inpatient appropriate because:Altered mental status, Ongoing diagnostic testing needed not appropriate for outpatient work up, Unsafe d/c plan, IV treatments appropriate due to intensity of illness or inability to take PO and Inpatient level of care appropriate due to severity of illness   Dispo: The patient is from: Home              Anticipated d/c is to: Home patient tentative discharge date 4/12              Patient currently is medically stable to d/c.   Difficult to place patient Yes-patient has out-of-state Medicaid which will not cover SNF in [place].  Plan is to discharge to [place] with her mother.  Transportation at this time difficult given issues with mobility/requiring Rollator with in significant pain in left leg with activity.  Plan is to optimize mobility while here prior to considering discharge.  Given her mobility issues if family unable to transport via private vehicle to [place] then [place] train would be her best option.  Level of care: Progressive  Code Status: Full Family Communication: Patient only DVT prophylaxis: SQ Heparin Vaccination status: Unknown  Foley catheter: no  HPI: 50 y.o. female with history of schizophrenia, depression/anxiety substance abuse-who was found down beside her husband (found dead by EMS)-upon presentation to the ED she was found to have acute toxic encephalopathy due to drug overdose-along with AKI, hyperkalemia, shock liver, CT imaging positive for renal infarcts-patient was intubated and admitted to the ICU.  She was started on CRRT.  Upon further stability-she was transferred to the [facility] service on 3/25.  Significant events: 3/17>> admit to ICU-intubated-started CRRT 3/25>> transferred to [facility]  Significant studies: 3/17>> UDS: Negative 3/17>> CT head: No acute  abnormalities. 3/17>> CT angio chest/abdomen: No aorticdissection,b/l renal infarcts 3/18>> RUQ ultrasound: Cholelithiasis 3/19>> Echo: EF 60-65% 3/21>> RUQ ultrasound: Cholelithiasis and sludge-no evidence of acute cholecystitis. 3/21>> MRI C-spine: Myositis-right posterior paraspinous muscles without fluid collection. 3/21>> MRI T-spine: Myositis involving posterior paraspinous muscle 3/21>> MRI L-spine: Right posterior muscle edema throughout lumbar spine 3/23>> renal ultrasound: No hydronephrosis 3/20>> CT C-spine: No bone/disc space infection 3/20>> CT LS spine: No spinal infection 3/20>> CT abdomen/pelvis: High-grade SBO transition point in the ventral abdominal wall hernia. 3/22>> CT abdomen/pelvis: Persistent dilated small bowel loops-consistent with partial SBO. 3/22>> HIDA scan: Delayed gallbladder filling-patency of cystic duct. 3/24>> left lower extremity Doppler: No DVT  Antimicrobial therapy: See below  Microbiology data: 3/17>> blood culture: No growth x5 days   Subjective:  Alert and has been ambulating in room short distances without walker.  Longer distances with walker and PT.  States has not ambulated in hall with nursing staff.  States would like to have Lyrica dose increased slightly if possible.  Discussed discharge plans.  Patient is aware that [employer] trains to [place] leave early in the morning with 1 trip available daily.  Plan is to discharge home with daughter on 4/12 with plans to catch [employer] train early in the morning in [place] on 4/13.  Patient states does not have a PCP in [place] and is interested in pursuing psychiatric follow-up as well as therapy options through her Medicaid in [place].   Objective: Vitals:   03/06/21 0700 03/06/21 0750  BP:    Pulse:    Resp: 19   Temp:  98.3 F (36.8 C)  SpO2:  Intake/Output Summary (Last 24 hours) at 03/06/2021 0839 Last data filed at 03/06/2021 0800 Gross per 24 hour  Intake 480 ml   Output --  Net 480 ml   Filed Weights   02/27/21 0400 03/02/21 0436 03/04/21 0457  Weight: 113.6 kg 114.8 kg 111 kg    Exam:  Constitutional: Alert, calm, mildly uncomfortable secondary to ongoing left lower extremity discomfort Respiratory: Lungs are clear, room air Cardiovascular: S1-S2, improved left lower extremity edema with most of edema located in the pedal region.  No JVD and pulses regular Abdomen: LBM 4/6, soft and nontender with normal active bowel sounds.  Tolerating diet. Neurologic: CN 2-12 grossly intact. Sensation intact upper extremities and diminished in lower extremities especially L LE., DTR normal. Strength 4/5 x all extremities except LLE which is 3/5.  Psychiatric: Alert and oriented x3.  Pleasant affect.   Assessment/Plan: Acute problems: AKI:  -Multifactorial etiology-due to rhabdomyolysis, contrast-induced nephropathy, bilateral renal infarcts -underwent CRRT from 3/17-3/23-and then received intermittent HD at the discretion of nephrology.   -Great urinary output over the past 24 hours. -Creatinine down to 2.79 as of 4/11 -TDC removed in IR 4/6; 4/7 Nephrology has signed off  Acute toxic encephalopathy: -Due to drug overdose-mental status is much better.  Shock liver:  -Improving -LFTs last checked on 3/26 with an ALT of 103 and an ALT of 86 with a normal total bilirubin  LLE edema -Has been present for several weeks and is now associated with significant pain -Plain x-rays unremarkable -No clinical signs of compartment syndrome and CK is normal -4/7 repeat venous duplex pending-venous duplex on 3/24 was negative for DVT or other abnormality -Low index of suspicion for soft tissue infection given normal white count, no focal redness or erythema, no fever, slightly elevated CRP.  ESR elevated at 80 but not unexpected in current clinical setting. -4/8 add LLE ACE wrap along with ice has improved edema.  She is also now demonstrating improved  ambulatory effort.    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  Will increase dose from 75 mg daily to twice daily given significant improvement in renal function. -slowly improving per patient -Ambulates short distances in room without rolling walker but does require walker..   -Neurology has signed off  Physical deconditioning -Secondary to recent life-threatening illness with associated AKI and rhabdo -Given no payer source not eligible for SNF in [place] PT and OT and maximize mobility-significant improvements over the past 48 hours and patient is eager to discharge home.  Declines wheelchair but agrees to charity based rolling walker.  Normocytic anemia: -2/2 acute illness ie AKI -no evidence of blood loss-CBC stable.  -Did receive PRBC transfusion during the earlier part of this hospital stay. -Agree 4/5 Hgb down to 6.7 with baseline around 9-10 so will transfuse 2 units PRBCs.  As of 4/6 hemoglobin 9.1. -Repeat anemia pnl with improvement in iron from 24-36 -Continue IV iron with dialysis treatments as per nephrology  Hyponatremia:  -Not unexpected and patient on intermittent dialysis with improving renal function  Acute Bereavement/grief/schizophrenia/depression:  -Husband died prior to admit 2/2 after smoking crack with patient.  -Evaluated by psychiatry -continue Zyprexa-remains on trazodone/Zoloft-safety sitter at bedside.  -4/4 reevaluated by psychiatry.  No longer requires inpatient  psychiatric evaluation but will need intensive outpatient follow-up.  Recommendations continue one-to-one sitter for now. -4/4 Zoloft increased to 100 mg by psych; additionally  recommended to not utilize Xanax for any anxiety symptoms due to prior history of polysubstance abuse but instead recommended use of Vistaril -4/8 psych discontinued safety sitter  Nutrition Status/Morbid Obesity: Nutrition Problem: Increased nutrient needs Etiology: acute illness (renal insufficiency requiring HD) Signs/Symptoms: estimated needs Interventions: Boost Breeze Estimated body mass index is 40.72 kg/m as calculated from the following:   Height as of this encounter: '5\' 5"'  (1.651 m).   Weight as of this encounter: 111 kg.   Other problems:  Acute hypoxic respiratory failure: Intubated on admission-currently stable on room air.  Small bowel obstruction: Treated with conservative measures-has resolved.   Chronic cholecystitis: No evidence for acute cystitis-see imaging studies above.   Data Reviewed: Basic Metabolic Panel: Recent Labs  Lab 03/02/21 0221 03/03/21 0204 03/04/21 0039 03/05/21 0013 03/06/21 0029  NA 136 138 138 138 138  K 3.7 3.7 3.9 4.5 3.6  CL 101 103 107 106 106  CO2 '28 26 25 28 22  ' GLUCOSE 89 97 96 92 91  BUN 53* 47* 43* 38* 35*  CREATININE 4.02* 3.54* 3.19* 2.95* 2.79*  CALCIUM 8.6* 8.6* 8.5* 8.5* 8.4*  PHOS 4.7* 4.5 4.9* 4.6 4.7*   Liver Function Tests: Recent Labs  Lab 03/02/21 0221 03/03/21 0204 03/04/21 0039 03/05/21 0013 03/06/21 0029  ALBUMIN 2.3* 2.4* 2.3* 2.6* 2.4*   No results for input(s): LIPASE, AMYLASE in the last 168 hours. No results for input(s): AMMONIA in the last 168 hours. CBC: Recent Labs  Lab 02/28/21 0155 02/28/21 0613 02/28/21 1944 03/01/21 0111 03/04/21 0039  WBC 10.3 8.9  --  8.5 8.4  NEUTROABS  --  3.1  --   --   --   HGB 7.0* 6.7* 9.4* 9.1* 8.8*  HCT 21.4* 20.2* 28.1* 27.2* 27.3*  MCV 91.1 90.2  --  89.2 93.5  PLT 240 229  --  195 263   Cardiac Enzymes: Recent Labs  Lab 03/01/21 0925  CKTOTAL 168   BNP (last 3 results) No results for input(s): BNP in the last 8760 hours.  ProBNP (last  3 results) No results for input(s): PROBNP in the last 8760 hours.  CBG: No results for input(s): GLUCAP in the last 168 hours.  No results found for this or any previous visit (from the past 240 hour(s)).   Studies: No results found.  Scheduled Meds: . sodium chloride   Intravenous Once  . acetaminophen  650 mg Oral Once  . Chlorhexidine Gluconate Cloth  6 each Topical Daily  . diphenhydrAMINE  25 mg Intravenous Once  . famotidine  20 mg Oral Daily  . feeding supplement  237 mL Oral BID BM  . heparin  5,000 Units Subcutaneous Q8H  . mouth rinse  15 mL Mouth Rinse BID  . multivitamin  1 tablet Oral QHS  . OLANZapine  10 mg Oral QHS  . pregabalin  75 mg Oral Daily  . sertraline  100 mg Oral Daily  . sodium chloride flush  10-40 mL Intracatheter Q12H  . traZODone  150 mg Oral QHS   Continuous Infusions:  Principal Problem:   Schizoaffective disorder, depressive type ([place]) Active Problems:   Renal failure   Encounter for central line placement   Polysubstance dependence [facility])   Consultants:  PCCM  Nephrology  Neurology  Orthopedics  Psychiatry  General surgery  Procedures: 3/17>> R IJ  HD cath 3/17 ETT>> 3/17 3/30>> tunneled HD catheter placement  Antibiotics: Anti-infectives (From admission, onward)   Start     Dose/Rate Route Frequency Ordered Stop   02/22/21 1015  ceFAZolin (ANCEF) powder 2 g  Status:  Discontinued        2 g Other To Surgery 02/22/21 0922 02/22/21 0945   02/22/21 1000  ceFAZolin (ANCEF) IVPB 2g/100 mL premix        2 g 200 mL/hr over 30 Minutes Intravenous To Radiology 02/22/21 0945 02/22/21 1250   02/16/21 1000  piperacillin-tazobactam (ZOSYN) IVPB 2.25 g  Status:  Discontinued        2.25 g 100 mL/hr over 30 Minutes Intravenous Every 8 hours 02/16/21 0720 02/17/21 1047   02/13/21 1400  piperacillin-tazobactam (ZOSYN) IVPB 3.375 g  Status:  Discontinued        3.375 g 12.5 mL/hr over 240 Minutes Intravenous Every 8 hours 02/13/21  0826 02/16/21 0720   02/13/21 0815  piperacillin-tazobactam (ZOSYN) IVPB 3.375 g        3.375 g 100 mL/hr over 30 Minutes Intravenous  Once 02/13/21 0733 02/13/21 0820   02/09/21 1015  ceFEPIme (MAXIPIME) 2 g in sodium chloride 0.9 % 100 mL IVPB        2 g 200 mL/hr over 30 Minutes Intravenous  Once 02/09/21 1010 02/09/21 1112   02/09/21 1015  vancomycin (VANCOCIN) IVPB 1000 mg/200 mL premix        1,000 mg 200 mL/hr over 60 Minutes Intravenous  Once 02/09/21 1010 02/09/21 1901       Time spent: 25 minutes    Erin Hearing ANP  [facility] 7 am - 330 pm/M-F for direct patient care and secure chat Please refer to Amion for contact info 25  days

## 2021-03-06 NOTE — Progress Notes (Signed)
Occupational Therapy Treatment Patient Details Name: Renee Wilkinson MRN: [id number] DOB: Feb 19, 1971 Today's Date: 03/06/2021    History of present illness Pt is a 50 yo female, [facility] after being found down (next to deceased spouse) after smoking crack cocaine and now with AKI, hyperkalemia, rhabdomyolysis, and leukocytosis.  Pt with hx of substance abuse.   OT comments  Pt making excellent progress towards OT goals, able to demonstrate safe use of bariatric Rollator for mobility to/from bathroom at Supervision level. Pt able to complete ADLs standing at sink with good self awareness of when seated rest break needed with Rollator. Pt Min A for donning compression stocking over L foot but able to manage remainder of LB dressing tasks without physical assistance. Ultimately hoped to attempt tub transfers beginning with weight shifting activities to increase weightbearing through L LE but pt reliant on UE support and unable to place weight solely through L LE due to pain. Pt reports plan to discharge to her mother's apartment in [place] where she will have access to a larger shower chair. Collaborated with pt on tub transfer techniques with pt planning to have family present initially to ensure safety. Pt demonstrating anticipatory awareness, improving memory, and good safety awareness. DC recs updated to [facility] follow-up.    Follow Up Recommendations  Home health OT    Equipment Recommendations  Other (comment) (bariatric Rollator)    Recommendations for Other Services      Precautions / Restrictions Precautions Precautions: Fall Precaution Comments: L LE swelling/pain Restrictions Weight Bearing Restrictions: No       Mobility Bed Mobility Overal bed mobility: Independent                  Transfers Overall transfer level: Needs assistance Equipment used: 4-wheeled walker Transfers: Sit to/from [employer] Transfers Sit to Stand: Supervision Stand  pivot transfers: Supervision       General transfer comment: Supervision, no physical assist needed. Pt able to navigate lock/unlocking of brakes with good safety awareness    Balance Overall balance assessment: Needs assistance Sitting-balance support: Feet supported;No upper extremity supported;Bilateral upper extremity supported;Single extremity supported Sitting balance-Leahy Scale: Good     Standing balance support: Bilateral upper extremity supported;During functional activity;Single extremity supported Standing balance-Leahy Scale: Fair Standing balance comment: fair static standing at sink, need for UE support during dynamic tasks                           ADL either performed or assessed with clinical judgement   ADL Overall ADL's : Needs assistance/impaired     Grooming: Standing;Brushing hair;Oral care;Modified independent Grooming Details (indicate cue type and reason): able to wash hair standing at sink, reminders for balance awarenesss, able to indicate when rest break needed             Lower Body Dressing: Minimal assistance Lower Body Dressing Details (indicate cue type and reason): Min A for compression stocking around foot with increased time due to pain. Pt reports able to doff compression stocking without assist Toilet Transfer: Supervision/safety;Ambulation;Regular Toilet;Grab bars Toilet Transfer Details (indicate cue type and reason): with Rollator, appropriate use of locks Toileting- Clothing Manipulation and Hygiene: Modified independent;Sit to/from stand;Sitting/lateral lean       Functional mobility during ADLs: Supervision/safety General ADL Comments: Initially planned to complete tub transfer practice - guided pt in weightshifting with Rollator and marching in place with pt unable to tolerate standing solely on LLE  without pushing down through Rollator. Discussed set up at mother's apartment (where pt plans to DC). pt reports her mother  has a shower chair where 2 legs can be placed outside of tub. Encouraged pt to use this DME/method of transfer for showering tasks until L LE pain/strength improves.     Vision   Vision Assessment?: No apparent visual deficits   Perception     Praxis      Cognition Arousal/Alertness: Awake/alert Behavior During Therapy: WFL for tasks assessed/performed Overall Cognitive Status: No family/caregiver present to determine baseline cognitive functioning Area of Impairment: Awareness;Attention                   Current Attention Level: Alternating       Awareness: Anticipatory   General Comments: Pt with much improved problem solving, ability to correct self and continues use of note pad as memory aid. anticipatory awareness, good safety awareness        Exercises     Shoulder Instructions       General Comments Assisted in further elevating LE on pillows in recliner.    Pertinent Vitals/ Pain       Pain Assessment: Faces Faces Pain Scale: Hurts little more Pain Location: L LE with dependent position and weightbearing Pain Descriptors / Indicators: Guarding;Sore;Spasm;Numbness Pain Intervention(s): Monitored during session;Repositioned;Premedicated before session;Limited activity within patient's tolerance  Home Living                                          Prior Functioning/Environment              Frequency  Min 2X/week        Progress Toward Goals  OT Goals(current goals can now be found in the care plan section)  Progress towards OT goals: Progressing toward goals  Acute Rehab OT Goals Patient Stated Goal: pain control, feel better OT Goal Formulation: With patient Time For Goal Achievement: 03/09/21 Potential to Achieve Goals: Good ADL Goals Pt Will Perform Grooming: with supervision;standing Pt Will Perform Lower Body Bathing: sitting/lateral leans;sit to/from stand;with adaptive equipment;with min assist Pt Will  Perform Lower Body Dressing: with min assist;with adaptive equipment;sitting/lateral leans;sit to/from stand Pt Will Transfer to Toilet: with supervision;ambulating Pt Will Perform Toileting - Clothing Manipulation and hygiene: with supervision;sitting/lateral leans;sit to/from stand Additional ADL Goal #1: Pt to verbalize at least 3 energy conservation strategies to implement during ADLs, IADls and mobility  Plan Discharge plan needs to be updated    Co-evaluation                 AM-PAC OT "6 Clicks" Daily Activity     Outcome Measure   Help from another person eating meals?: None Help from another person taking care of personal grooming?: None Help from another person toileting, which includes using toliet, bedpan, or urinal?: A Little Help from another person bathing (including washing, rinsing, drying)?: A Little Help from another person to put on and taking off regular upper body clothing?: None Help from another person to put on and taking off regular lower body clothing?: A Little 6 Click Score: 21    End of Session Equipment Utilized During Treatment: Other (comment) (rollator)  OT Visit Diagnosis: Unsteadiness on feet (R26.81);Other abnormalities of gait and mobility (R26.89);Muscle weakness (generalized) (M62.81);Pain Pain - Right/Left: Left Pain - part of body: Leg   Activity Tolerance Patient tolerated  treatment well   Patient Left in chair;with call bell/phone within reach   Nurse Communication Mobility status;Other (comment) (pain level, stocking)        Time: [id number] OT Time Calculation (min): 51 min  Charges: OT General Charges $OT Visit: 1 Visit OT Treatments $Self Care/Home Management : 23-37 mins $Therapeutic Activity: 8-22 mins  Malachy Chamber, OTR/L Acute Rehab Services Office: [phone]   Layla Maw 03/06/2021, 12:40 PM

## 2021-03-06 NOTE — TOC Progression Note (Addendum)
Transition of Care [facility]) - Progression Note    Patient Details  Name: Renee Wilkinson MRN: [id number] Date of Birth: 06/29/1971  Transition of Care [facility]) CM/SW Contact  Curlene Labrum, RN Phone Number: 03/06/2021, 1:04 PM  Clinical Narrative:    Case management spoke with the patient at the bedside regarding transitions of care to home tomorrow, 03/07/2021 with daughter.  The patient plans to take an [facility] train to [place] on 03/08/2021 to her mother's home  - mother Renee Wilkinson [address], [place], [place] [address]).  The patient states that she does not have access to cash for [employer].  I spoke with [name], MSW direct and permission granted to purchase an [facility] ticket for the patient through [place].  [name], MSW is purchasing a ticket to give to the patient.  CM spoke with the patient's daughter, Renee Wilkinson, on the phone and the patient will be discharged back to the daughter' address tomorrow in [facility] and the daughter plans to pick her up by car for discharge.   The patient's discharge medications will be provided through [place] and delivered to the patient's hospital room prior to discharge tomorrow.  The patient will need PCP and psychiatry [place] follow up in [place] after discharge from [facility].  I called and left a message with [name], CM with [facility] in [place]  to assist with follow up appointments in the [place] area through the patient's [facility].  I called and was unable to reach the [facility] office to set up follow up visits at [phone] - office closed on Mondays after 12 noon and will reopen Mon - Friday from 830 am - 4 pm.  [employer] called and RW changed to bariatric rollator - [employer] will switch out the dme at request of OT today.  TOC pharmacy is reaching out to the family to pay for $60.00 co-pay for medications since the patient has out-of-state Medicaid in  [place].  CM will continue to follow the patient for transitions of care needs. Expected Discharge Plan: Home/Self Care Barriers to Discharge: No Barriers Identified  Expected Discharge Plan and Services Expected Discharge Plan: Home/Self Care In-house Referral: Clinical Social [facility] / [facility] Discharge Planning Services: CM Consult,Indigent Health Clinic,Medication Assistance Post Acute Care Choice: Durable Medical Equipment Living arrangements for the past 2 months: Hotel/Motel                 DME Arranged: Gilford Rile rolling DME Agency: [employer] Date DME Agency Contacted: 03/06/21 Time DME Agency Contacted: 2 Representative spoke with at DME Agency: [name], [place] with [employer]             Social Determinants of Health ([place]) Interventions    Readmission Risk Interventions Readmission Risk Prevention Plan 03/06/2021  Transportation Screening Complete  PCP or Specialist Appt within 3-5 Days Complete  HRI or [place] Complete  Social Work Consult for [place] Planning/Counseling Complete  Palliative Care Screening Complete  Medication Review Press photographer) Complete

## 2021-03-07 LAB — RENAL FUNCTION PANEL
Albumin: 2.6 g/dL — ABNORMAL LOW (ref 3.5–5.0)
Anion gap: 6 (ref 5–15)
BUN: 31 mg/dL — ABNORMAL HIGH (ref 6–20)
CO2: 24 mmol/L (ref 22–32)
Calcium: 8.7 mg/dL — ABNORMAL LOW (ref 8.9–10.3)
Chloride: 109 mmol/L (ref 98–111)
Creatinine, Ser: 2.39 mg/dL — ABNORMAL HIGH (ref 0.44–1.00)
GFR, Estimated: 24 mL/min — ABNORMAL LOW (ref >60)
Glucose, Bld: 92 mg/dL (ref 70–99)
Phosphorus: 5 mg/dL — ABNORMAL HIGH (ref 2.5–4.6)
Potassium: 3.6 mmol/L (ref 3.5–5.1)
Sodium: 139 mmol/L (ref 135–145)

## 2021-03-07 NOTE — Progress Notes (Signed)
CSW purchased [employer] administrator for patient for transportation to [place], [place] - which is the closest bus depot to her mother's home in [place], [place]. Two copies of the ticket were given to the patient.  The bus departs from [address] [place] on 03/08/21 at 8:05am. Patient and her daughter [name] were educated and updated on plan.  [name], MSW, LCSW Transitions of Care  Clinical Social Worker II [phone]

## 2021-03-07 NOTE — Discharge Instructions (Signed)
Managing Pain Without Opioids Opioids are strong medicines used to treat moderate to severe pain. For some people, especially those who have long-term (chronic) pain, opioids may not be the best choice for pain management due to:  Side effects like nausea, constipation, and sleepiness.  The risk of addiction (opioid use disorder). The longer you take opioids, the greater your risk of addiction. Pain that lasts for more than 3 months is called chronic pain. Managing chronic pain usually requires more than one approach and is often provided by a team of health care providers working together (multidisciplinary approach). Pain management may be done at a pain management center or pain clinic. Types of pain management without opioids Managing pain without opioids can involve:  Non-opioid medicines.  Exercises to help relieve pain and improve strength and range of motion (physical therapy).  Therapy to help with everyday tasks and activities (occupational therapy).  Therapy to help you find ways to relieve pain by doing things you enjoy (recreational therapy).  Talk therapy (psychotherapy) and other mental health therapies.  Medical treatments such as injections or devices.  Making lifestyle changes. Pain management options Non-opioid medicines Non-opioid medicines for pain may include medicines taken by mouth (oral medicines), such as:  Over-the-counter or prescription NSAIDs. These may be the first medicines used for pain. They work well for muscle and bone pain, and they reduce swelling.  Acetaminophen. This over-the-counter medicine may work well for milder pain but not swelling.  Antidepressants. These may be used to treat chronic pain. A certain type of antidepressant (tricyclics) is often used. These medicines are given in lower doses for pain than when used for depression.  Anticonvulsants. These are usually used to treat seizures but may also reduce nerve (neuropathic)  pain.  Muscle relaxants. These relieve pain caused by sudden muscle tightening (spasms). You may also use a type of pain medicine that is applied to the skin as a patch, cream, or gel (topical analgesic), such as a numbing medicine. These may cause fewer side effects than oral medicines. Therapy Physical therapy involves doing exercises to gain strength and flexibility. A physical therapist may teach you exercises to move and stretch parts of your body that are weak, stiff, or painful. You can learn these exercises at physical therapy visits and practice them at home. Physical therapy may also involve:  Massage.  Heat wraps or applying heat or cold to affected areas.  Sending electrical signals through the skin to interrupt pain signals (transcutaneous electrical nerve stimulation, TENS).  Sending weak lasers through the skin to reduce pain and swelling (low-level laser therapy).  Using signals from your body to help you learn to regulate pain (biofeedback). Occupational therapy helps you learn ways to function at home and work with less pain. Recreational therapy may involve trying new activities or hobbies, such as drawing or a physical activity. Types of mental health therapy for pain include:  Cognitive behavioral therapy (CBT) to help you learn coping skills for dealing with pain.  Acceptance and commitment therapy (ACT) to change the way you think and react to pain.  Relaxation therapies, including muscle relaxation exercises and focusing your mind on the present moment to lower stress (mindfulness-based stress reduction).  Pain management counseling. This may be individual, family, or group counseling.   Medical treatments Medical treatments for pain management include:  Nerve block injections. These may include a pain blocker and anti-inflammatory medicines. You may have injections: ? Near the spine to relieve chronic back or neck pain. ?  Into joints to relieve back or joint  pain. ? Into nerve areas that supply a painful area to relieve body pain. ? Into muscles (trigger point injections) to relieve some painful muscle conditions.  A medical device placed near your spine to help block pain signals and relieve nerve pain or chronic back pain (spinal cord stimulation device).  Acupuncture. Follow these instructions at home Medicines  Take over-the-counter and prescription medicines only as told by your health care provider.  If you are taking pain medicine, ask your health care providers about possible side effects to watch out for.  Do not drive or use heavy machinery while taking prescription pain medicine. Lifestyle  Do not use drugs or alcohol to reduce pain. Limit alcohol intake to no more than 1 drink a day for nonpregnant women and 2 drinks a day for men. One drink equals 12 oz of beer, 5 oz of wine, or 1 oz of hard liquor.  Do not use any products that contain nicotine or tobacco, such as cigarettes and e-cigarettes. These can delay healing. If you need help quitting, ask your health care provider.  Eat a healthy diet and maintain a healthy weight. Poor diet and excess weight may make pain worse. ? Eat foods that are high in fiber. These include fresh fruits and vegetables, whole grains, and beans. ? Limit foods that are high in fat and processed sugars, such as fried and sweet foods.  Exercise regularly. Exercise lowers stress and may help relieve pain. ? Ask your health care provider what activities and exercises are safe for you. ? If your health care provider approves, join an exercise class that combines movement and stress reduction. Examples include yoga and tai chi.  Get enough sleep. Lack of sleep may make pain worse.  Lower stress as much as possible. Practice stress reduction techniques as told by your therapist.   General instructions  Work with all your pain management providers to find the treatments that work best for you. You are  an important member of your pain management team. There are many things you can do to reduce pain on your own.  Consider joining an online or in-person support group for people who have chronic pain.  Keep all follow-up visits as told by your health care providers. This is important. Where to find more information You can find more information about managing pain without opioids from:  American Academy of Pain Medicine: painmed.[place] for Chronic Pain: instituteforchronicpain.org  American Chronic Pain Association: theacpa.org Contact a health care provider if:  You have side effects from pain medicine.  Your pain gets worse or does not get better with treatments or home care.  You are struggling with anxiety or depression. Summary  Many types of pain can be managed without opioids. Chronic pain may respond better to pain management without opioids.  Pain is best managed with a team of providers working together.  Pain management without opioids may include non-opioid medicines, medical treatments, physical therapy, mental health therapy, and lifestyle changes.  Tell your health care providers if your pain gets worse or is not being managed well enough. This information is not intended to replace advice given to you by your health care provider. Make sure you discuss any questions you have with your health care provider. Document Revised: 08/25/2020 Document Reviewed: 08/25/2020 Elsevier Patient Education  [employer].

## 2021-03-07 NOTE — Progress Notes (Signed)
[facility]                                                                                                                                                                         Patient Demographics  Renee Wilkinson, is a 50 y.o. female  [id number]  [id number]  DOB - 1971-05-12  Admit date - 02/09/2021  Admitting Physician Lanier Clam, MD  Outpatient Primary MD for the patient is Patient, No Pcp Per (Inactive)  LOS - 109   Chief Complaint  Patient presents with  . Shortness of Breath        Assessment & Plan    Patient seen briefly today due for discharge soon to [place] with her mother, she is sitting in recliner, eating breakfast at a good spirits, SW is arranging for home health and equipment, she is symptom-free eager to be discharged.  No subjective complaints, will follow with her PCP in 1 to 2 weeks upon discharge.    Medications  Scheduled Meds: . sodium chloride   Intravenous Once  . acetaminophen  650 mg Oral Once  . Chlorhexidine Gluconate Cloth  6 each Topical Daily  . diphenhydrAMINE  25 mg Intravenous Once  . famotidine  20 mg Oral Daily  . feeding supplement  237 mL Oral BID BM  . heparin  5,000 Units Subcutaneous Q8H  . mouth rinse  15 mL Mouth Rinse BID  . multivitamin  1 tablet Oral QHS  . OLANZapine  10 mg Oral QHS  . pregabalin  75 mg Oral BID  . sertraline  100 mg Oral Daily  . traZODone  150 mg Oral QHS   Continuous Infusions: PRN Meds:.acetaminophen, ALPRAZolam, opium-belladonna, docusate, haloperidol lactate, HYDROcodone-acetaminophen, lidocaine (PF), methocarbamol, ondansetron (ZOFRAN) IV, phenol, sodium chloride flush    Time Spent in minutes   10 minutes   Lala Lund M.D on 03/07/2021 at 10:19 AM  Between 7am to 7pm - Pager - [phone]  After 7pm go to [email or web address] - password [email or web address]  And look for the night coverage person covering  for me after hours  [facility] Office  [phone]    Subjective:   Meriel Pica today has, No headache, No chest pain, No abdominal pain - No Nausea, No new weakness tingling or numbness, No Cough - SOB.    Objective:   Vitals:   03/07/21 0018 03/07/21 0323 03/07/21 0358 03/07/21 0748  BP: 139/72  (!) 145/86 (!) 142/87  Pulse: 80  65 75  Resp: '18  18 20  '$ Temp: 98 F (36.7 C)  98.4 F (36.9 C) 98.1 F (36.7 C)  TempSrc: Axillary  Oral Oral  SpO2: 98%  93% 96%  Weight:  112.6 kg  Height:        Wt Readings from Last 3 Encounters:  03/07/21 112.6 kg     Intake/Output Summary (Last 24 hours) at 03/07/2021 1019 Last data filed at 03/07/2021 0600 Gross per 24 hour  Intake 960 ml  Output --  Net 960 ml    Exam Awake Alert, Oriented X 3, No new F.N deficits, Normal affect [place].AT,PERRAL Supple Neck,No JVD, No cervical lymphadenopathy appriciated.  Symmetrical Chest wall movement, Good air movement bilaterally, CTAB RRR,No Gallops,Rubs or new Murmurs, No Parasternal Heave +ve B.Sounds, Abd Soft, Non tender, No organomegaly appriciated, No rebound - guarding or rigidity. No Cyanosis, L leg mild edema   Data Review

## 2021-03-07 NOTE — TOC Progression Note (Addendum)
Transition of Care [facility]) - Progression Note    Patient Details  Name: Renee Wilkinson MRN: [id number] Date of Birth: 1971-02-10  Transition of Care [facility]) CM/SW [place], RN Phone Number: 03/07/2021, 9:12 AM  Clinical Narrative:    Case management spoke with the patient's daughter, Renee Wilkinson, on the phone regarding transitions of care to home today.  The patient's daughter plans pick the patient up today for discharge home after 1:30 pm today.  I called and scheduled the patient for a PCP follow up with Renee Wilkinson in [place] - scheduled for March 24, 2021 at 10:20 am.  Clinicals and discharge summary to be faxed to the [facility] in [place], [place] at fax # [phone].  The patient's medications to be received from [facility] today at the bedside prior to discharge to home.  The TOC co-pay amount was paid by the patient's mother over the phone.  The patient should be discharged home with her Rollator delivered to the room by [employer].  The patient will be provided with a transportation ticket to ride the [employer] bus back to [place], scheduled to depart [place], [place] at 8:00 am in the morning.  The patient's daughter, Renee Wilkinson is aware of the discharge plans.  [employer] was called at 1000 am to deliver the patient's rollator to the patient's room since rollator not delivered yesterday.  Sharyn Lull, primary RN states that [facility] delivered the patient's medications to the nursing desk in preparation for discharge.  CM and MSW will continue to follow the patient for discharge plans to home today with the patient's daughter Renee Wilkinson.  03/07/2021 1129 - CM called and spoke with the patient's daughter, Renee Wilkinson, on the phone regarding discharge instructions, transportation plans, medications and follow up appointment with PCP in [place].  The patient's daughter verbalizes understanding.  03/07/2021 1410 - CM spoke with [name], CM with [facility] in  [place] and she was updated with patient's planned discharge home to mother's home in [place] and she will follow up with the patient for MSW follow up once she arrives in [place].   Expected Discharge Plan: Home/Self Care Barriers to Discharge: No Barriers Identified  Expected Discharge Plan and Services Expected Discharge Plan: Home/Self Care In-house Referral: Clinical Social [facility] Discharge Planning Services: CM Consult,Indigent Health Clinic,Medication Assistance Post Acute Care Choice: Durable Medical Equipment Living arrangements for the past 2 months: Hotel/Motel                 DME Arranged: Gilford Rile rolling DME Agency: [employer] Date DME Agency Contacted: 03/06/21 Time DME Agency Contacted: 31 Representative spoke with at DME Agency: [name], [place] with [employer]             Social Determinants of Health ([place]) Interventions    Readmission Risk Interventions Readmission Risk Prevention Plan 03/06/2021  Transportation Screening Complete  PCP or Specialist Appt within 3-5 Days Complete  HRI or [place] Complete  Social Work Consult for [place] Planning/Counseling Complete  Palliative Care Screening Complete  Medication Review [employer]) Complete

## 2022-04-26 DEATH — deceased
